# Patient Record
Sex: Male | Born: 1965
Health system: Southern US, Community
[De-identification: ages and names within clinical notes are randomized; demographics above are authoritative.]

## PROBLEM LIST (undated history)

## (undated) DIAGNOSIS — I10 Essential (primary) hypertension: Secondary | ICD-10-CM

## (undated) DIAGNOSIS — K5792 Diverticulitis of intestine, part unspecified, without perforation or abscess without bleeding: Secondary | ICD-10-CM

## (undated) DIAGNOSIS — F419 Anxiety disorder, unspecified: Secondary | ICD-10-CM

## (undated) DIAGNOSIS — I509 Heart failure, unspecified: Secondary | ICD-10-CM

## (undated) DIAGNOSIS — J45902 Unspecified asthma with status asthmaticus: Secondary | ICD-10-CM

## (undated) DIAGNOSIS — F32A Depression, unspecified: Secondary | ICD-10-CM

## (undated) DIAGNOSIS — F329 Major depressive disorder, single episode, unspecified: Secondary | ICD-10-CM

## (undated) DIAGNOSIS — E785 Hyperlipidemia, unspecified: Secondary | ICD-10-CM

## (undated) DIAGNOSIS — E119 Type 2 diabetes mellitus without complications: Secondary | ICD-10-CM

## (undated) DIAGNOSIS — K219 Gastro-esophageal reflux disease without esophagitis: Secondary | ICD-10-CM

## (undated) HISTORY — DX: Gastro-esophageal reflux disease without esophagitis: K21.9

## (undated) HISTORY — PX: COLON SURGERY: SHX602

## (undated) HISTORY — DX: Hyperlipidemia, unspecified: E78.5

## (undated) HISTORY — DX: Major depressive disorder, single episode, unspecified: F32.9

## (undated) HISTORY — DX: Unspecified asthma with status asthmaticus: J45.902

## (undated) HISTORY — DX: Anxiety disorder, unspecified: F41.9

## (undated) HISTORY — DX: Depression, unspecified: F32.A

---

## 2007-04-02 ENCOUNTER — Emergency Department: Payer: Self-pay | Admitting: Unknown Physician Specialty

## 2012-11-02 ENCOUNTER — Emergency Department: Payer: Self-pay | Admitting: Emergency Medicine

## 2012-11-02 LAB — CBC WITH DIFFERENTIAL/PLATELET
Basophil #: 0.1 10*3/uL (ref 0.0–0.1)
Eosinophil %: 4.5 %
HCT: 43.4 % (ref 40.0–52.0)
Lymphocyte #: 2.6 10*3/uL (ref 1.0–3.6)
MCH: 29.2 pg (ref 26.0–34.0)
MCHC: 35.8 g/dL (ref 32.0–36.0)
MCV: 82 fL (ref 80–100)
Neutrophil #: 2.8 10*3/uL (ref 1.4–6.5)
Neutrophil %: 46.5 %
Platelet: 306 10*3/uL (ref 150–440)
RDW: 14.1 % (ref 11.5–14.5)
WBC: 6.1 10*3/uL (ref 3.8–10.6)

## 2012-11-02 LAB — COMPREHENSIVE METABOLIC PANEL
Albumin: 4.2 g/dL (ref 3.4–5.0)
Alkaline Phosphatase: 89 U/L (ref 50–136)
Anion Gap: 7 (ref 7–16)
BUN: 17 mg/dL (ref 7–18)
Bilirubin,Total: 0.4 mg/dL (ref 0.2–1.0)
Calcium, Total: 9.8 mg/dL (ref 8.5–10.1)
Creatinine: 1.23 mg/dL (ref 0.60–1.30)
EGFR (African American): >60
EGFR (Non-African Amer.): >60
Glucose: 393 mg/dL — ABNORMAL HIGH (ref 65–99)
SGPT (ALT): 28 U/L (ref 12–78)
Sodium: 130 mmol/L — ABNORMAL LOW (ref 136–145)
Total Protein: 8.5 g/dL — ABNORMAL HIGH (ref 6.4–8.2)

## 2012-11-02 LAB — URINALYSIS, COMPLETE
Blood: NEGATIVE
Leukocyte Esterase: NEGATIVE
Ph: 5 (ref 4.5–8.0)
Specific Gravity: 1.033 (ref 1.003–1.030)
Squamous Epithelial: NONE SEEN
WBC UR: NONE SEEN /HPF (ref 0–5)

## 2013-12-10 LAB — COMPREHENSIVE METABOLIC PANEL
ALT: 29 U/L (ref 12–78)
ANION GAP: 8 (ref 7–16)
AST: 27 U/L (ref 15–37)
Albumin: 4.4 g/dL (ref 3.4–5.0)
Alkaline Phosphatase: 66 U/L
BILIRUBIN TOTAL: 0.9 mg/dL (ref 0.2–1.0)
BUN: 16 mg/dL (ref 7–18)
CALCIUM: 9.2 mg/dL (ref 8.5–10.1)
CO2: 25 mmol/L (ref 21–32)
Chloride: 107 mmol/L (ref 98–107)
Creatinine: 1.34 mg/dL — ABNORMAL HIGH (ref 0.60–1.30)
EGFR (African American): >60
Glucose: 153 mg/dL — ABNORMAL HIGH (ref 65–99)
OSMOLALITY: 284 (ref 275–301)
Potassium: 3.7 mmol/L (ref 3.5–5.1)
Sodium: 140 mmol/L (ref 136–145)
Total Protein: 8.4 g/dL — ABNORMAL HIGH (ref 6.4–8.2)

## 2013-12-10 LAB — CBC WITH DIFFERENTIAL/PLATELET
Basophil #: 0.1 10*3/uL (ref 0.0–0.1)
Basophil %: 0.6 %
EOS ABS: 0 10*3/uL (ref 0.0–0.7)
Eosinophil %: 0.1 %
HCT: 47.3 % (ref 40.0–52.0)
HGB: 16.2 g/dL (ref 13.0–18.0)
Lymphocyte #: 2.2 10*3/uL (ref 1.0–3.6)
Lymphocyte %: 16.2 %
MCH: 28.4 pg (ref 26.0–34.0)
MCHC: 34.2 g/dL (ref 32.0–36.0)
MCV: 83 fL (ref 80–100)
MONOS PCT: 4.5 %
Monocyte #: 0.6 x10 3/mm (ref 0.2–1.0)
Neutrophil #: 10.7 10*3/uL — ABNORMAL HIGH (ref 1.4–6.5)
Neutrophil %: 78.6 %
PLATELETS: 372 10*3/uL (ref 150–440)
RBC: 5.69 10*6/uL (ref 4.40–5.90)
RDW: 13.4 % (ref 11.5–14.5)
WBC: 13.6 10*3/uL — ABNORMAL HIGH (ref 3.8–10.6)

## 2013-12-10 LAB — LIPASE, BLOOD: LIPASE: 135 U/L (ref 73–393)

## 2013-12-11 ENCOUNTER — Inpatient Hospital Stay: Payer: Self-pay | Admitting: Internal Medicine

## 2013-12-11 LAB — URINALYSIS, COMPLETE
BLOOD: NEGATIVE
Bacteria: NONE SEEN
Bilirubin,UR: NEGATIVE
Glucose,UR: NEGATIVE mg/dL (ref 0–75)
Leukocyte Esterase: NEGATIVE
Nitrite: NEGATIVE
PH: 7 (ref 4.5–8.0)
Protein: 30
SQUAMOUS EPITHELIAL: NONE SEEN
Specific Gravity: 1.054 (ref 1.003–1.030)

## 2013-12-12 LAB — BASIC METABOLIC PANEL
Anion Gap: 5 — ABNORMAL LOW (ref 7–16)
BUN: 11 mg/dL (ref 7–18)
CALCIUM: 8.4 mg/dL — AB (ref 8.5–10.1)
Chloride: 107 mmol/L (ref 98–107)
Co2: 27 mmol/L (ref 21–32)
Creatinine: 1.08 mg/dL (ref 0.60–1.30)
EGFR (African American): >60
GLUCOSE: 96 mg/dL (ref 65–99)
OSMOLALITY: 277 (ref 275–301)
Potassium: 3.7 mmol/L (ref 3.5–5.1)
SODIUM: 139 mmol/L (ref 136–145)

## 2013-12-12 LAB — CBC WITH DIFFERENTIAL/PLATELET
BASOS PCT: 0.5 %
Basophil #: 0 10*3/uL (ref 0.0–0.1)
EOS ABS: 0.3 10*3/uL (ref 0.0–0.7)
Eosinophil %: 3.7 %
HCT: 42.1 % (ref 40.0–52.0)
HGB: 14.5 g/dL (ref 13.0–18.0)
LYMPHS PCT: 38.7 %
Lymphocyte #: 3 10*3/uL (ref 1.0–3.6)
MCH: 29.1 pg (ref 26.0–34.0)
MCHC: 34.5 g/dL (ref 32.0–36.0)
MCV: 85 fL (ref 80–100)
Monocyte #: 0.5 x10 3/mm (ref 0.2–1.0)
Monocyte %: 6.6 %
NEUTROS PCT: 50.5 %
Neutrophil #: 3.9 10*3/uL (ref 1.4–6.5)
Platelet: 277 10*3/uL (ref 150–440)
RBC: 4.98 10*6/uL (ref 4.40–5.90)
RDW: 13.5 % (ref 11.5–14.5)
WBC: 7.8 10*3/uL (ref 3.8–10.6)

## 2014-03-13 ENCOUNTER — Inpatient Hospital Stay: Payer: Self-pay | Admitting: Internal Medicine

## 2014-03-13 DIAGNOSIS — I059 Rheumatic mitral valve disease, unspecified: Secondary | ICD-10-CM

## 2014-03-13 LAB — CBC
HCT: 42.1 % (ref 40.0–52.0)
HGB: 14.3 g/dL (ref 13.0–18.0)
MCH: 28.6 pg (ref 26.0–34.0)
MCHC: 34 g/dL (ref 32.0–36.0)
MCV: 84 fL (ref 80–100)
PLATELETS: 309 10*3/uL (ref 150–440)
RBC: 5 10*6/uL (ref 4.40–5.90)
RDW: 13.6 % (ref 11.5–14.5)
WBC: 6.8 10*3/uL (ref 3.8–10.6)

## 2014-03-13 LAB — COMPREHENSIVE METABOLIC PANEL
ALT: 27 U/L
AST: 18 U/L (ref 15–37)
Albumin: 3.6 g/dL (ref 3.4–5.0)
Alkaline Phosphatase: 55 U/L
Anion Gap: 12 (ref 7–16)
BUN: 10 mg/dL (ref 7–18)
Bilirubin,Total: 0.5 mg/dL (ref 0.2–1.0)
CO2: 22 mmol/L (ref 21–32)
Calcium, Total: 8.6 mg/dL (ref 8.5–10.1)
Chloride: 106 mmol/L (ref 98–107)
Creatinine: 0.98 mg/dL (ref 0.60–1.30)
EGFR (Non-African Amer.): >60
Glucose: 146 mg/dL — ABNORMAL HIGH (ref 65–99)
Osmolality: 281 (ref 275–301)
Potassium: 3.6 mmol/L (ref 3.5–5.1)
SODIUM: 140 mmol/L (ref 136–145)
Total Protein: 7.3 g/dL (ref 6.4–8.2)

## 2014-03-13 LAB — TROPONIN I
Troponin-I: <0.02 ng/mL
Troponin-I: <0.02 ng/mL

## 2014-03-13 LAB — PRO B NATRIURETIC PEPTIDE: B-Type Natriuretic Peptide: 682 pg/mL — ABNORMAL HIGH (ref 0–125)

## 2014-03-14 DIAGNOSIS — J9600 Acute respiratory failure, unspecified whether with hypoxia or hypercapnia: Secondary | ICD-10-CM

## 2014-03-14 DIAGNOSIS — I10 Essential (primary) hypertension: Secondary | ICD-10-CM

## 2014-03-14 DIAGNOSIS — I509 Heart failure, unspecified: Secondary | ICD-10-CM

## 2014-03-14 LAB — CBC WITH DIFFERENTIAL/PLATELET
Basophil #: 0 10*3/uL (ref 0.0–0.1)
Basophil %: 0.1 %
EOS PCT: 0 %
Eosinophil #: 0 10*3/uL (ref 0.0–0.7)
HCT: 39.5 % — ABNORMAL LOW (ref 40.0–52.0)
HGB: 14 g/dL (ref 13.0–18.0)
LYMPHS ABS: 0.9 10*3/uL — AB (ref 1.0–3.6)
Lymphocyte %: 9.4 %
MCH: 29.2 pg (ref 26.0–34.0)
MCHC: 35.5 g/dL (ref 32.0–36.0)
MCV: 82 fL (ref 80–100)
Monocyte #: 0.1 x10 3/mm — ABNORMAL LOW (ref 0.2–1.0)
Monocyte %: 1.6 %
NEUTROS ABS: 8.2 10*3/uL — AB (ref 1.4–6.5)
Neutrophil %: 88.9 %
Platelet: 320 10*3/uL (ref 150–440)
RBC: 4.8 10*6/uL (ref 4.40–5.90)
RDW: 13.5 % (ref 11.5–14.5)
WBC: 9.3 10*3/uL (ref 3.8–10.6)

## 2014-03-14 LAB — BASIC METABOLIC PANEL
Anion Gap: 8 (ref 7–16)
BUN: 17 mg/dL (ref 7–18)
CREATININE: 1.1 mg/dL (ref 0.60–1.30)
Calcium, Total: 8.5 mg/dL (ref 8.5–10.1)
Chloride: 106 mmol/L (ref 98–107)
Co2: 23 mmol/L (ref 21–32)
EGFR (Non-African Amer.): >60
Glucose: 208 mg/dL — ABNORMAL HIGH (ref 65–99)
OSMOLALITY: 281 (ref 275–301)
POTASSIUM: 3.7 mmol/L (ref 3.5–5.1)
Sodium: 137 mmol/L (ref 136–145)

## 2014-03-15 LAB — BASIC METABOLIC PANEL
Anion Gap: 7 (ref 7–16)
BUN: 24 mg/dL — ABNORMAL HIGH (ref 7–18)
CALCIUM: 8.8 mg/dL (ref 8.5–10.1)
Chloride: 99 mmol/L (ref 98–107)
Co2: 26 mmol/L (ref 21–32)
Creatinine: 1.44 mg/dL — ABNORMAL HIGH (ref 0.60–1.30)
EGFR (African American): >60
EGFR (Non-African Amer.): 57 — ABNORMAL LOW
Glucose: 396 mg/dL — ABNORMAL HIGH (ref 65–99)
Osmolality: 285 (ref 275–301)
Potassium: 4 mmol/L (ref 3.5–5.1)
SODIUM: 132 mmol/L — AB (ref 136–145)

## 2014-03-15 LAB — HEMOGLOBIN A1C: Hemoglobin A1C: 7.1 % — ABNORMAL HIGH (ref 4.2–6.3)

## 2014-03-16 LAB — BASIC METABOLIC PANEL
Anion Gap: 5 — ABNORMAL LOW (ref 7–16)
BUN: 21 mg/dL — AB (ref 7–18)
CALCIUM: 8.5 mg/dL (ref 8.5–10.1)
CREATININE: 1 mg/dL (ref 0.60–1.30)
Chloride: 100 mmol/L (ref 98–107)
Co2: 28 mmol/L (ref 21–32)
EGFR (African American): >60
GLUCOSE: 278 mg/dL — AB (ref 65–99)
OSMOLALITY: 279 (ref 275–301)
Potassium: 4.1 mmol/L (ref 3.5–5.1)
SODIUM: 133 mmol/L — AB (ref 136–145)

## 2014-09-25 ENCOUNTER — Emergency Department: Payer: Self-pay | Admitting: Emergency Medicine

## 2014-11-30 NOTE — H&P (Signed)
PATIENT NAME:  Kenneth Rios, Kenneth Rios MR#:  [id number] DATE OF BIRTH:  12/02/1965  DATE OF ADMISSION:  12/11/2013  REFERRING PHYSICIAN: Dr. Lucrezia Europe  PRIMARY CARE PHYSICIAN: None, but reports occasionally at [facility] clinic.   CHIEF COMPLAINT: Abdominal pain.   HISTORY OF PRESENT ILLNESS: This is a 49 year old male with known history of hypertension, diverticulitis in the past, and diabetes mellitus, and GERD, who presents with complaints of abdominal pain, reports pain has been going on for the last 24 hours, denies any fever, any chills, any diarrhea, any constipation, but reports nausea and vomiting. Reports pain in his left lower quadrant. Reports he had an episode of such pain in the past many years ago where he was diagnosed with diverticulitis. The patient was afebrile, did not have any leukocytosis but his CT abdomen did show evidence of mild acute diverticulitis. The patient required multiple pain medication in the ED so hospitalists were requested to admit the patient.   PAST MEDICAL HISTORY:  1. GERD. 2. Hypertension.  3. Diabetes.   PAST SURGICAL HISTORY: None.   ALLERGIES: None.   HOME MEDICATIONS: The patient cannot recall any of his home medications, but reports he is on something for reflux and high blood pressure and he thinks it may be metformin for diabetes.   FAMILY HISTORY: Significant for diabetes mellitus and grandfather died at the age of 61 of MI.   SOCIAL HISTORY: The patient denies any smoking. Reports he drinks alcohol on the weekends. No illicit drug use.   REVIEW OF SYSTEMS: GENERAL: Denies fever, chills, fatigue, weakness.  HEENT: Eyes: Denies blurry vision, double vision, inflammation, glaucoma.  ENT: Denies tinnitus, ear pain, hearing loss, epistaxis.  RESPIRATORY: Denies cough, wheezing, hemoptysis.  CARDIOVASCULAR: Denies chest pain, edema, palpitations, syncope.  GASTROINTESTINAL: Reports nausea, vomiting, abdominal pain. Denies diarrhea,  constipation, hematemesis, melena.  GENITOURINARY: Denies dysuria, hematuria, renal colic.  ENDOCRINE: Denies polyuria, polydipsia, heat, or cold intolerance.  HEMATOLOGY: Denies anemia, easy bruising, bleeding diathesis.  INTEGUMENT: Denies acne, rash or skin lesions.  MUSCULOSKELETAL: Denies any joint effusion or erythema, swelling, gout, cramps.  NEUROLOGIC: Denies CVA, TIA, tremors, vertigo, ataxia.  PSYCHIATRIC: Denies anxiety, insomnia, or depression.   PHYSICAL EXAMINATION:  VITAL SIGNS: Temperature 98.4, pulse 99, respiratory rate 24, blood pressure 176/113, saturating 97% on room air.  GENERAL: Well-nourished male who looks comfortable in bed, in mild distress due to pain. HEENT: Head atraumatic, normocephalic. Pupils equal, reactive to light. Pink conjunctivae. Anicteric sclerae. Moist oral mucosa.  NECK: Supple. No thyromegaly. No JVD.  CHEST: Good air entry bilaterally. No wheezing, rales, rhonchi.  CARDIOVASCULAR: S1, S2 heard. No rubs, murmurs, or gallops.  ABDOMEN: Soft, nondistended. Bowel sounds present. Has mild tenderness in the left lower quadrant. No rebound, no guarding.  EXTREMITIES: No edema. No clubbing. No cyanosis. Radial and pedal pulses +2 bilaterally.   PSYCHIATRIC: Appropriate affect. Awake, alert x3. Intact judgment and insight.  NEUROLOGIC: Cranial nerves grossly intact. Motor 5/5. No focal deficits.  MUSCULOSKELETAL: No joint effusion or erythema. Has left thumb partially amputated.  LYMPHATIC: No cervical lymphadenopathy could be appreciated.   IMAGING STUDIES: Mild acute diverticulitis of the distal descending colon. No adjacent focal fluid collection to suggest an abscess.   PERTINENT LABORATORY DATA: Glucose 153, BUN 16, creatinine 1.34, sodium 140, potassium 3.7, chloride 107. White blood cells 13.6, hemoglobin 16.2, hematocrit 47.3, platelets 372,000.   ASSESSMENT AND PLAN:  1. Acute diverticulitis. The patient has mild leukocytosis and significant  pain requiring IV pain  medicine so he will be admitted, will keep n.p.o., on IV fluid hydration. Will be kept on IV Cipro and Flagyl, p.r.n. nausea and pain medicine.  2. Diabetes mellitus. The patient will be n.p.o. so will hold his oral hypoglycemic agents. Will do fingersticks every 6 hours once his home medication is known, we can resume him back on them prior to discharge. While in the hospital, if uncontrolled, we can start him on insulin sliding scale.  3. Hypertension, uncontrolled as well I think has pain contributing to it. Will keep him on p.r.n. hydralazine until his morning meds are known.  4. History of gastroesophageal reflux disease. Start him on Protonix.  5. Deep vein thrombosis prophylaxis. Subcutaneous heparin.   CODE STATUS: Full code.   TOTAL TIME SPENT ON ADMISSION AND PATIENT CARE: 45 minutes.    ____________________________ Starleen Armsawood S. Donovan Persley, MD dse:lt D: 12/11/2013 02:33:50 ET T: 12/11/2013 06:09:54 ET JOB#: [id number]  cc: Starleen Armsawood S. Daliya Parchment, MD, <Dictator> Jennings Stirling Teena IraniS Ariza Evans MD ELECTRONICALLY SIGNED 12/20/2013 23:42

## 2014-11-30 NOTE — Consult Note (Signed)
General Aspect Primary Cardiologist: New to [place]  49 y/o M with h/o HTN, DM, and diverticulitis who presented to [facility] ED on 03/13/2014 with markedly elevated BPs (162/111 at [facility]), as well as increased SOB, and DOE over the past 2 weeks. _______________________   Present Illness 49 y/o M with the above problem list who presented to [facility] on 03/13/2014 with markedly elevated blood pressures of 162/111 at [facility] today, as well as increased SOB, DOE and a dry cough over the past 2 weeks.   No known prior cardiac history or work up.  Over the past 2 weeks he has noticed increased SOB and DOE, sometimes to the point of having to stop and take a break when walking to the corner store. He reports this distance as being from his room (227) to the cafeteria downstairs. With this exertion he will become quite diaphoretic and lightheaded. Never with chest pain. With resting his symptoms will resolve and his is able to continue on his path. He denies any nausea, vomiting, or syncope. He called his nurse at the [facility] and got an appointment. At that appointment he was found to have a BP of 162/111 and pulse ox of 94% on RA. He received 2 dounebs, 125 mg of solumedrol, and 325 mg of aspirin and was transfered to [facility]. At [facility] his troponin have been negative x 3, pro BNP 682, EKG with NSR, 94, LVH, no st/t changes. Echo is pending.   He notes over the past 6 months he has gone from sleeping with 1 pillow to 2 flat pillows, to back in May he bought 2 large pillows and has been sleeping with them. Also, he has had times where he has been apneic at night. He also notes his weight has considerably increased over the past month. Notes a large increase in his abdominal girth. No LLE. Pants continue to fit the same. He does notice early satiety over the past 1 month. He does not use oxygen at home.   He does not take any daily medications for his HTN or DM since spring. Blood sugars  have been found to be midlly elevated at 146-208.   Physical Exam:  GEN well developed, well nourished, no acute distress, pleasant   HEENT PERRL, hearing intact to voice, wearing [place]   NECK supple   RESP normal resp effort  deminished bilaterally   CARD Regular rate and rhythm  Murmur   Murmur Systolic  3/6 LUSB   ABD positive tenderness  soft  normal BS  diffuse TTP   EXTR negative edema   SKIN normal to palpation   NEURO cranial nerves intact   PSYCH alert, A+O to time, place, person, good insight   Review of Systems:  General: Weight gain   Skin: No Complaints   ENT: No Complaints   Eyes: No Complaints   Neck: No Complaints   Respiratory: Frequent cough  Short of breath   Cardiovascular: Dyspnea   Gastrointestinal: No Complaints   Genitourinary: No Complaints   Vascular: No Complaints   Musculoskeletal: No Complaints   Neurologic: No Complaints   Hematologic: No Complaints   Endocrine: No Complaints   Psychiatric: No Complaints   Review of Systems: All other systems were reviewed and found to be negative   Medications/Allergies Reviewed Medications/Allergies reviewed   Family & Social History:  Family and Social History:  Family History Mother side: CAD/MI, Father side: CAD/MI   Social History negative  tobacco, positive ETOH, remote THC in high school. drinks a 12 pack every Friday night.   Place of Living Home     htn:    Diabetes:    GERD - Esophageal Reflux:    Diverticulitis:   Lab Results:  Routine Chem:  06-Aug-15 04:00   Glucose, Serum  208  BUN 17  Creatinine (comp) 1.10  Sodium, Serum 137  Potassium, Serum 3.7  Chloride, Serum 106  CO2, Serum 23  Calcium (Total), Serum 8.5  Anion Gap 8  Osmolality (calc) 281  eGFR (African American) >60  eGFR (Non-African American) >60 (eGFR values <20m/min/1.73 m2 may be an indication of chronic kidney disease (CKD). Calculated eGFR is useful in patients with stable renal  function. The eGFR calculation will not be reliable in acutely ill patients when serum creatinine is changing rapidly. It is not useful in  patients on dialysis. The eGFR calculation may not be applicable to patients at the low and high extremes of body sizes, pregnant women, and vegetarians.)  Cardiac:  05-Aug-15 11:24   Troponin I < 0.02 (0.00-0.05 0.05 ng/mL or less: NEGATIVE  Repeat testing in 3-6 hrs  if clinically indicated. >0.05 ng/mL: POTENTIAL  MYOCARDIAL INJURY. Repeat  testing in 3-6 hrs if  clinically indicated. NOTE: An increase or decrease  of 30% or more on serial  testing suggests a  clinically important change)    18:23   Troponin I < 0.02 (0.00-0.05 0.05 ng/mL or less: NEGATIVE  Repeat testing in 3-6 hrs  if clinically indicated. >0.05 ng/mL: POTENTIAL  MYOCARDIAL INJURY. Repeat  testing in 3-6 hrs if  clinically indicated. NOTE: An increase or decrease  of 30% or more on serial  testing suggests a  clinically important change)    20:35   Troponin I < 0.02 (0.00-0.05 0.05 ng/mL or less: NEGATIVE  Repeat testing in 3-6 hrs  if clinically indicated. >0.05 ng/mL: POTENTIAL  MYOCARDIAL INJURY. Repeat  testing in 3-6 hrs if  clinically indicated. NOTE: An increase or decrease  of 30% or more on serial  testing suggests a  clinically important change)  Routine Hem:  06-Aug-15 04:00   WBC (CBC) 9.3  RBC (CBC) 4.80  Hemoglobin (CBC) 14.0  Hematocrit (CBC)  39.5  Platelet Count (CBC) 320  MCV 82  MCH 29.2  MCHC 35.5  RDW 13.5  Neutrophil % 88.9  Lymphocyte % 9.4  Monocyte % 1.6  Eosinophil % 0.0  Basophil % 0.1  Neutrophil #  8.2  Lymphocyte #  0.9  Monocyte #  0.1  Eosinophil # 0.0  Basophil # 0.0 (Result(s) reported on 14 Mar 2014 at 04:54AM.)   EKG:  EKG Interp. by me   Interpretation NSR, 94, LVH, no st/t changes   Radiology Results: XRay:    05-Aug-15 12:07, Chest PA and Lateral  Chest PA and Lateral   REASON FOR EXAM:     Shortness of Breath  COMMENTS:   May transport without cardiac monitor    PROCEDURE: DXR - DXR CHEST PA (OR AP) AND LATERAL  - Mar 13 2014 12:07PM     CLINICAL DATA:  Shortness of breath    EXAM:  CHEST  2 VIEW    COMPARISON:  None.    FINDINGS:  Cardiomegaly. Linear areas of scarring in the upper lobes  bilaterally. Diffuse interstitial prominence may reflect  interstitial edema. No confluent opacities or effusions. No acute  bony abnormality.     IMPRESSION:  Cardiomegaly, suspect mild interstitial  edema.    Bilateral upper lobe scarring.      Electronically Signed    By: Rolm Baptise M.D.    On: 03/13/2014 12:35         Verified By: Raelyn Number, M.D.,    No Known Allergies:   Vital Signs/Nurse's Notes: **Vital Signs.:   06-Aug-15 12:28  Vital Signs Type Routine  Temperature Temperature (F) 97.3  Celsius 36.2  Temperature Source oral  Pulse Pulse 88  Respirations Respirations 18  Systolic BP Systolic BP 428  Diastolic BP (mmHg) Diastolic BP (mmHg) 92  Mean BP 112  Pulse Ox % Pulse Ox % 97  Pulse Ox Activity Level  At rest  Oxygen Delivery 2L  *Intake and Output.:   Shift 06-Aug-15 15:00  Grand Totals Intake:   Output:  2325    Net:  -53 24 Hr.:  -2325  Urine ml     Out:  2325  Length of Stay Totals Intake:  300 Output:  3350    Net:  -89    Impression 49 y/o M with h/o HTN, DM, and diverticulitis who presented to [facility] ED on 03/13/2014 with markedly elevated BPs (162/111 at [facility] as well as increased SOB, and DOE over the past 2 weeks.  1) CHF: Type to be determined. Echo is pending. Pro BNP 682. He is diuresising well on lasix 40 mg bid. Currently -2325 for the 24hr. Creatinine stable at 1.10. Weight 225 on admission. 218 currently. He states he does not eat a diet high is fried foods or high in sodium. He will need follow up in the CHF clinic. EKG without ischemic changes. Troponin negative x 3. Pending echo results may pursue  ischemic evaluation as outpatient with nuc. Hold bb in setting of new onset acute CHF. Plan for acei once he is back into better shape.   2) Acute respiratory distress: Currently on 2L [place]. No lower extremity swelling, erythema, warmth, or cording. Not tachycardic. He was tachypneic at outside clinic, this has resolved with treatment. He does not use oxygen at home. CT of chest will be order per IM note. Currently receiving nebs and low dose steroids.    3) HTN: Currently 150's/90's. Will continue to monitor and get under better control.   4) DM: A1C pending. On SSI while inpatient.   Electronic Signatures for Addendum Section:  Kathlyn Sacramento (MD) (Signed Addendum 06-Aug-15 17:15)  The patient was seen and examined. Agree with the above. He has prolonged history of untreated hypertension. He presented with new onset heart failure. Echo showed an EF of 30-35% with global hypokinesis likely due to hypertensive heart disease.  Recommend: Continue diuresis.  I added Coreg and Lisinopril.  Outpatient nuclear stress test.  Advised him to stop ETOH .   Electronic Signatures: Kathlyn Sacramento (MD)  (Signed 06-Aug-15 17:15)  Co-Signer: General Aspect/Present Illness, Home Medications, Allergies Rise Mu (PA-C)  (Signed 06-Aug-15 16:18)  Authored: General Aspect/Present Illness, History and Physical Exam, Review of System, Family & Social History, Past Medical History, Home Medications, Labs, EKG , Radiology, Allergies, Vital Signs/Nurse's Notes, Impression/Plan   Last Updated: 06-Aug-15 17:15 by Kathlyn Sacramento (MD)

## 2014-11-30 NOTE — Discharge Summary (Signed)
PATIENT NAME:  Kenneth Rios, Kenneth Rios MR#:  [id number] DATE OF BIRTH:  1965/10/23  DATE OF ADMISSION:  03/13/2014 DATE OF DISCHARGE:  03/16/2014  PRIMARY CARE PHYSICIAN:  At [facility].   FINAL DIAGNOSES: 1.  Acute systolic congestive heart failure.  2.  Accelerated hypertension.  3.  Diabetes.  4.  Bronchitis.   MEDICATIONS:  On discharge include:  1.  Lisinopril 10 mg daily.  2.  Aspirin 81 mg daily.  3.  Coreg 12.5 mg twice a day.  4.  Zithromax 250 mg 1 tablet once a day for 2 days.  5.  Furosemide 40 mg daily.  6.  Glucophage 500 mg twice a day.   DIET: Low-sodium, carbohydrate controlled diet; diet is regular consistency.    ACTIVITY: As tolerated.   FOLLOWUP: At [facility] in 1 week; Congestive Heart Failure Clinic, Diabetes Clinic in 1 to 2 weeks, [facility].   HOSPITAL COURSE: The patient was admitted 03/13/2014, and discharged 03/16/2014; came in with trouble breathing, was admitted with acute congestive heart failure started on IV Lasix, started on antibiotics, and steroids for wheezing.   LABORATORY AND RADIOLOGICAL DATA:  1.  EKG showed normal sinus rhythm, left atrial enlargement, nonspecific ST-T wave changes. 2.  BNP 682; troponin negative; glucose 146, BUN 10, creatinine 0.98, sodium 140, potassium 3.6, chloride 106, CO2 of 22, calcium 8.6; liver function tests normal range; white blood cell count 6.8, hemoglobin, and hematocrit 14.3, and 42.1; platelet count of 309,000; chest x-ray showed cardiomegaly, suspect mild interstitial edema, bilateral upper lobe scarring  3.  Two troponins negative.  4.  Echocardiogram showed an ejection fraction of 30% to 35%, impaired relaxation of left ventricular diastolic filling, moderately dilated left atrium, moderate mitral valve regurgitation. 5.  Hemoglobin A1c 7.1, creatinine upon discharge 1.0, potassium 4.1, sodium 133.   HOSPITAL COURSE:  Per problem list:  1.  For the acute systolic congestive  heart failure, the patient was diuresed with IV Lasix, the patient's lungs were clear upon discharge, cut back to 40 mg once a day; lisinopril, and Coreg were added, and Coreg was titrated up to 12.5 mg b.i.d.  Can consider adding Aldactone as an outpatient.  2.  Accelerated hypertension. The patient's blood pressure variable during the hospital course; numbers as low as 124/77, high as 143/97. Continue to monitor with newly started medications.  3.  Diabetes. Can go back on his metformin.  4.  Bronchitis. We will give a completion of the course of the Zithromax, steroids were stopped, no wheezing upon discharge.   TIME SPENT ON DISCHARGE: Was 35 minutes.    ____________________________ Herschell Dimesichard J. Renae GlossWieting, MD rjw:nt D: 03/16/2014 15:21:17 ET T: 03/16/2014 16:25:31 ET JOB#: [id number]  cc: Herschell Dimesichard J. Renae GlossWieting, MD, <Dictator> [facility] Christopher Burge, MD   Salley ScarletICHARD J Sami Froh MD ELECTRONICALLY SIGNED 03/18/2014 11:49

## 2014-11-30 NOTE — H&P (Signed)
PATIENT NAME:  Kenneth Rios, Kenneth Rios MR#:  [id number] DATE OF BIRTH:  Jan 31, 1966  DATE OF ADMISSION:  03/13/2014  PRIMARY CARE PHYSICIAN:  [facility].  CHIEF COMPLAINT: Trouble breathing.   HISTORY OF PRESENT ILLNESS: The patient is a 49 year old obese male who came in from [facility] because of elevated blood pressure and the patient was [facility]. The patient has been having trouble breathing for about 2 weeks, gotten worse, so he [facility] and the patient was found blood to have elevated blood pressure at 162/111 and he also has tachypneic, so they sent him over here.  The patient's oxygen saturation is 94% on room air when he came and blood pressure was 162/111 and he received 325 mg of aspirin and 2 DuoNebs and 125 mg Solu-Medrol.  We are going to admit the patient for new onset CHF.  The patient has a history of hypertension and diabetes but not taking any medications since April.  The patient has some complaints of short windedness for 2 weeks associated with dry cough and the patient says that breathing makes the shortness of breath worse and he feels more cough, especially dry cough when he tries to take a deep breath. The patient denies any chest pain. No orthopnea. No PND. No pedal edema. The patient denies any dizziness. Denies any fever and denies any chills and sweating.   PAST MEDICAL HISTORY: Significant for recent admission for diverticulitis and history of diabetes and hypertension. The patient was admitted in May, from May 5 to May 7.  At that time, he was discharged on Cipro and Flagyl.   ALLERGIES: He has no allergies.   SOCIAL HISTORY: No smoking. No drinking. No drugs. The patient works at [employer] [place].  He works for [employer], mainly involved in [employer]making [employer] and [employer]. He denies any seasonal allergies and allergies at work.  He is a former  smoker.  MEDICATIONS: Not taking any medications since April.   PAST SURGICAL HISTORY: None.   FAMILY HISTORY: No hypertension or diabetes. Has a history of heart attack in the maternal grandfather and diabetes in  maternal grandmother.   REVIEW OF SYSTEMS:  CONSTITUTIONAL: No fever. No fatigue.  EYES: No blurred vision.  ENT: No tinnitus. No ear pain. No epistaxis.  RESPIRATORY: The patient has dry cough and trouble breathing and trouble breathing is going on for 2 weeks. The patient yesterday coughs when he tries to take deep breaths.  CARDIOVASCULAR: No chest pain. No orthopnea. No PND.  GASTROINTESTINAL: No nausea. No vomiting, abdominal pain. GENITOURINARY:  No dysuria.  ENDOCRINE: No polyuria or nocturia. HEMATOLOGIC:  No anemia.  INTEGUMENT: No skin rashes.  MUSCULOSKELETAL: No joint pains.  NEUROLOGIC: No numbness or weakness.  PSYCHIATRIC: No anxiety or insomnia.   PHYSICAL EXAMINATION: VITAL SIGNS: Temperature 97.9, heart rate is 96, blood pressure is 162/111, saturations 98% on room air.  The patient's repeat blood pressure during my visit is around 130/70, saturations are 98% on room air.  GENERAL: He is alert, awake, oriented, 49 year old male, appears in slight distress because of  continuous dry cough. The patient appears slightly anxious.  HEENT:  PERRLA.  EOM intact.  No conjunctivitis. No icterus. No oropharyngeal erythema. Mucous membranes are dry.  NECK: Supple. No JVD. No carotid bruit. Normal range of motion. The patient has mild expiratory wheeze in right lung fields. No JVD.  RESPIRATORY: As  I mentioned.  The patient has no increased effort of respiration.  CARDIOVASCULAR: Slightly tachycardic. Regular rate.  PMI not displaced. Good pedal pulses, trace pitting edema.  ABDOMEN: Soft, nontender, nondistended. Bowel sounds present. No hernias.  MUSCULOSKELETAL: 5/5 muscular strength in 4 extremities.  EXTREMITIES: The patient has no kyphosis.  SKIN: No skin  rashes.   LYMPHATICS:  No  lymphadenopathy in cervical or axillary region.   NEUROLOGIC: Cranial nerves II through XII intact. Power 5/5 in upper and lower extremities. Sensory intact.  DTRs 2+ bilaterally.  PSYCHIATRIC: , alert and oriented x3. Mood and effect are WNL  LABORATORY DATA: WBC 7.8, hemoglobin 14.5, hematocrit 42.1, platelets a 277,000.  Electrolytes: Sodium is 139, potassium 3.7, chloride 107, bicarbonate 27, BUN 11, creatinine 1.8, glucose 96. The patient's bacteria none in the urine. Troponin less than 0.02. BNP 682.   RADIOLOGY DATA:  Chest x-ray shows cardiomegaly with some mild interstitial edema,  bilateral upper lobe scarring.   EKG shows normal sinus rhythm at 94 beats per minute. No ST-T changes  ASSESSMENT AND PLAN: The patient is a 49 year old male patient with hypertension, diabetes, medication noncompliance, comes in with trouble breathing and dry cough with elevated BNP.  1.  Admit him to telemetry for new onset congestive heart failure. Start him on IV Lasix 40 mg q. 12 hours and check daily weights. Follow echocardiogram, and the patient's will have CHF Clinic followup and continue low-sodium diet.  2.  With trouble breathing, evaluate for possible myocardial infarction. Can cycle the troponins. Continue aspirin and avoid beta blockers because of dry cough.  3.  Possible bronchitis and chronic obstructive pulmonary disease flare with some wheezing. Continue him on nebulizers and add small dose steroids.  The patient can get CT of the chest after the echocardiogram is normal to evaluate for lung function.  4.  Diabetes mellitus, type 2. The patient will have hemoglobin A1c, fasting lipase and start him on sliding scale with coverage.   TIME SPENT ON HISTORY AND PHYSICAL: About 60 minutes.    ____________________________ Katha Hamming, MD sk:ds D: 03/13/2014 16:23:10 ET T: 03/13/2014 17:20:25 ET JOB#: [id number]  cc: Katha Hamming, MD,  <Dictator> Katha Hamming MD ELECTRONICALLY SIGNED 04/16/2014 13:09

## 2014-11-30 NOTE — Discharge Summary (Signed)
Dates of Admission and Diagnosis:  Date of Admission 11-Dec-2013   Date of Discharge 13-Dec-2013   Admitting Diagnosis Ac diverticulitis   Final Diagnosis Ac diverticulitis Htn Dm    Chief Complaint/History of Present Illness a 49 year old male with known history of hypertension, diverticulitis in the past, and diabetes mellitus, and GERD, who presents with complaints of abdominal pain, reports pain has been going on for the last 24 hours, denies any fever, any chills, any diarrhea, any constipation, but reports nausea and vomiting. Reports pain in his left lower quadrant. Reports he had an episode of such pain in the past many years ago where he was diagnosed with diverticulitis. The patient was afebrile, did not have any leukocytosis but his CT abdomen did show evidence of mild acute diverticulitis. The patient required multiple pain medication in the ED so hospitalists were requested to admit the patient.   Allergies:  No Known Allergies:   Hepatic:  04-May-15 21:56   Bilirubin, Total 0.9  Alkaline Phosphatase 66 (45-117 NOTE: New Reference Range 06/29/13)  SGPT (ALT) 29  SGOT (AST) 27  Total Protein, Serum  8.4  Albumin, Serum 4.4  Routine Chem:  04-May-15 21:56   Glucose, Serum  153  BUN 16  Creatinine (comp)  1.34  Sodium, Serum 140  Potassium, Serum 3.7  Chloride, Serum 107  CO2, Serum 25  Calcium (Total), Serum 9.2  Anion Gap 8  Osmolality (calc) 284  eGFR (African American) >60  eGFR (Non-African American) >60 (eGFR values <23m/min/1.73 m2 may be an indication of chronic kidney disease (CKD). Calculated eGFR is useful in patients with stable renal function. The eGFR calculation will not be reliable in acutely ill patients when serum creatinine is changing rapidly. It is not useful in  patients on dialysis. The eGFR calculation may not be applicable to patients at the low and high extremes of body sizes, pregnant women, and vegetarians.)  Lipase 135  (Result(s) reported on 10 Dec 2013 at 10:49PM.)  06-May-15 03:47   Creatinine (comp) 1.08  Routine Hem:  04-May-15 21:56   WBC (CBC)  13.6  RBC (CBC) 5.69  Hemoglobin (CBC) 16.2  Hematocrit (CBC) 47.3  Platelet Count (CBC) 372  MCV 83  MCH 28.4  MCHC 34.2  RDW 13.4  Neutrophil % 78.6  Lymphocyte % 16.2  Monocyte % 4.5  Eosinophil % 0.1  Basophil % 0.6  Neutrophil #  10.7  Lymphocyte # 2.2  Monocyte # 0.6  Eosinophil # 0.0  Basophil # 0.1 (Result(s) reported on 10 Dec 2013 at 10:17PM.)   PERTINENT RADIOLOGY STUDIES: CT:    05-May-15 00:23, CT Abdomen and Pelvis With Contrast  CT Abdomen and Pelvis With Contrast   REASON FOR EXAM:    (1) ABD PAIN; (2) ABD PAIN  COMMENTS:       PROCEDURE: CT  - CT ABDOMEN / PELVIS  W  - Dec 11 2013 12:23AM     CLINICAL DATA:  Left lower quadrant pain, elevated WBCs, history of  diverticulosis    EXAM:  CT ABDOMEN AND PELVIS WITH CONTRAST    TECHNIQUE:  Multidetector CT imaging of the abdomen and pelvis was performed  using the standard protocol following bolus administration of  intravenous contrast.  CONTRAST:  100 mL Isovue-300    COMPARISON:  None.    FINDINGS:  The lungbases are clear.    The liver demonstrates no focal abnormality. There is no  intrahepatic or extrahepatic biliary ductal dilatation. The  gallbladder is normal.  The spleen demonstrates no focal abnormality.  The kidneys, adrenal glands and pancreas are normal. The bladder is  unremarkable.    The stomach, duodenum, small intestine, and large intestine  demonstrate no contrast extravasation or dilatation. There is  diverticulosis of the descending and sigmoid colon with mild    Sigmoidal inflammatory changes involving the distal descending colon  most consistent with acute diverticulitis. There is no  peridiverticular fluid collection. There is no pneumoperitoneum,  pneumatosis, or portal venous gas. There is no abdominal or pelvic  free fluid. There is  no lymphadenopathy.    The abdominal aorta is normal in caliber.    There are no lytic or sclerotic osseous lesions.     IMPRESSION:  1. Mild acute diverticulitis of the distal descending colon. No  adjacent focal fluid collection to suggest an abscess.  Electronically Signed    By: Kathreen Devoid    On: 12/11/2013 00:36         Verified By: Jennette Banker, M.D., MD   Pertinent Past History:  Pertinent Past History 1. GERD. 2. Hypertension.  3. Diabetes.   Hospital Course:  Hospital Course * Acute diverticulitis. on IV fluid hydration. on IV Cipro and Flagyl, p.r.n. nausea and pain medicine. tolerated liquid diet.     swiched pain meds to oral for long term control. IV for breakthrough. much better now- will give soft diet- if tolerates- discharge  later today. * Diabetes mellitus. n.p.o. , hold his oral hypoglycemic agents. blood glucose stable. as less intake. * Hypertension, uncontrolled , pain contributing to it.  on p.r.n. hydralazine. now little better controlled. started on amlodipin oral.   Condition on Discharge Stable   Code Status:  Code Status Full Code   DISCHARGE INSTRUCTIONS HOME MEDS:  Medication Reconciliation: Patient's Home Medications at Discharge:     Medication Instructions  flagyl 500 mg oral tablet  1 tab(s) orally every 8 hours x 10 days   oxycodone 15 mg oral tablet, extended release  1 tab(s) orally every 12 hours x 5 days   acetaminophen-oxycodone 325 mg-5 mg oral tablet  1 tab(s) orally 1 to 4 times a day x 4 days, As Needed, pain , As needed, pain   amlodipine 5 mg oral tablet  1 tab(s) orally once a day   pantoprazole 40 mg oral delayed release tablet  1 tab(s) orally once a day   cipro 500 mg oral tablet  1 tab(s) orally every 12 hours x 10 days     Physician's Instructions:  Diet Low Sodium  Carbohydrate Controlled (ADA) Diet   Activity Limitations As tolerated   Return to Work Not Applicable   Time frame for Follow Up  Appointment 2-4 weeks   Other Comments routine follow ups with PMD.   Electronic Signatures: Vaughan Basta (MD)  (Signed 10-May-15 23:56)  Authored: ADMISSION DATE AND DIAGNOSIS, CHIEF COMPLAINT/HPI, Allergies, PERTINENT LABS, PERTINENT RADIOLOGY STUDIES, PERTINENT PAST HISTORY, HOSPITAL COURSE, DISCHARGE INSTRUCTIONS HOME MEDS, PATIENT INSTRUCTIONS   Last Updated: 10-May-15 23:56 by Vaughan Basta (MD)

## 2017-07-15 ENCOUNTER — Ambulatory Visit: Payer: Self-pay | Admitting: Internal Medicine

## 2017-08-24 ENCOUNTER — Other Ambulatory Visit: Payer: Self-pay

## 2017-08-24 ENCOUNTER — Emergency Department (HOSPITAL_COMMUNITY): Payer: Medicaid Other

## 2017-08-24 ENCOUNTER — Inpatient Hospital Stay (HOSPITAL_COMMUNITY)
Admission: EM | Admit: 2017-08-24 | Discharge: 2017-08-30 | DRG: 190 | Disposition: A | Discharge status: Home / Self Care | Payer: Medicaid Other | Attending: Family Medicine | Admitting: Family Medicine

## 2017-08-24 ENCOUNTER — Encounter (HOSPITAL_COMMUNITY): Payer: Self-pay

## 2017-08-24 DIAGNOSIS — I110 Hypertensive heart disease with heart failure: Secondary | ICD-10-CM

## 2017-08-24 DIAGNOSIS — E1165 Type 2 diabetes mellitus with hyperglycemia: Secondary | ICD-10-CM | POA: Diagnosis present

## 2017-08-24 DIAGNOSIS — I161 Hypertensive emergency: Secondary | ICD-10-CM | POA: Diagnosis present

## 2017-08-24 DIAGNOSIS — I160 Hypertensive urgency: Secondary | ICD-10-CM | POA: Diagnosis present

## 2017-08-24 DIAGNOSIS — J209 Acute bronchitis, unspecified: Secondary | ICD-10-CM | POA: Diagnosis present

## 2017-08-24 DIAGNOSIS — I10 Essential (primary) hypertension: Secondary | ICD-10-CM

## 2017-08-24 DIAGNOSIS — J441 Chronic obstructive pulmonary disease with (acute) exacerbation: Principal | ICD-10-CM | POA: Diagnosis present

## 2017-08-24 DIAGNOSIS — J440 Chronic obstructive pulmonary disease with acute lower respiratory infection: Secondary | ICD-10-CM | POA: Diagnosis present

## 2017-08-24 DIAGNOSIS — Z7984 Long term (current) use of oral hypoglycemic drugs: Secondary | ICD-10-CM | POA: Diagnosis not present

## 2017-08-24 DIAGNOSIS — B9789 Other viral agents as the cause of diseases classified elsewhere: Secondary | ICD-10-CM | POA: Diagnosis present

## 2017-08-24 DIAGNOSIS — K5720 Diverticulitis of large intestine with perforation and abscess without bleeding: Secondary | ICD-10-CM | POA: Diagnosis present

## 2017-08-24 DIAGNOSIS — Z79899 Other long term (current) drug therapy: Secondary | ICD-10-CM

## 2017-08-24 DIAGNOSIS — J9801 Acute bronchospasm: Secondary | ICD-10-CM

## 2017-08-24 DIAGNOSIS — Z23 Encounter for immunization: Secondary | ICD-10-CM | POA: Diagnosis not present

## 2017-08-24 DIAGNOSIS — T380X5A Adverse effect of glucocorticoids and synthetic analogues, initial encounter: Secondary | ICD-10-CM | POA: Diagnosis present

## 2017-08-24 DIAGNOSIS — K5792 Diverticulitis of intestine, part unspecified, without perforation or abscess without bleeding: Secondary | ICD-10-CM | POA: Diagnosis not present

## 2017-08-24 DIAGNOSIS — E1159 Type 2 diabetes mellitus with other circulatory complications: Secondary | ICD-10-CM

## 2017-08-24 DIAGNOSIS — R51 Headache: Secondary | ICD-10-CM | POA: Diagnosis present

## 2017-08-24 DIAGNOSIS — Z9119 Patient's noncompliance with other medical treatment and regimen: Secondary | ICD-10-CM | POA: Diagnosis not present

## 2017-08-24 DIAGNOSIS — I509 Heart failure, unspecified: Secondary | ICD-10-CM

## 2017-08-24 DIAGNOSIS — Z87891 Personal history of nicotine dependence: Secondary | ICD-10-CM | POA: Diagnosis not present

## 2017-08-24 DIAGNOSIS — R791 Abnormal coagulation profile: Secondary | ICD-10-CM | POA: Diagnosis present

## 2017-08-24 DIAGNOSIS — I5043 Acute on chronic combined systolic (congestive) and diastolic (congestive) heart failure: Secondary | ICD-10-CM | POA: Diagnosis present

## 2017-08-24 DIAGNOSIS — R109 Unspecified abdominal pain: Secondary | ICD-10-CM

## 2017-08-24 DIAGNOSIS — J208 Acute bronchitis due to other specified organisms: Secondary | ICD-10-CM

## 2017-08-24 DIAGNOSIS — J45902 Unspecified asthma with status asthmaticus: Secondary | ICD-10-CM | POA: Diagnosis not present

## 2017-08-24 DIAGNOSIS — E119 Type 2 diabetes mellitus without complications: Secondary | ICD-10-CM

## 2017-08-24 DIAGNOSIS — Z91128 Patient's intentional underdosing of medication regimen for other reason: Secondary | ICD-10-CM

## 2017-08-24 DIAGNOSIS — I152 Hypertension secondary to endocrine disorders: Secondary | ICD-10-CM

## 2017-08-24 DIAGNOSIS — I5042 Chronic combined systolic (congestive) and diastolic (congestive) heart failure: Secondary | ICD-10-CM

## 2017-08-24 HISTORY — DX: Type 2 diabetes mellitus with other circulatory complications: E11.59

## 2017-08-24 HISTORY — DX: Type 2 diabetes mellitus without complications: E11.9

## 2017-08-24 HISTORY — DX: Essential (primary) hypertension: I10

## 2017-08-24 HISTORY — DX: Acute bronchitis due to other specified organisms: J20.8

## 2017-08-24 HISTORY — DX: Diverticulitis of intestine, part unspecified, without perforation or abscess without bleeding: K57.92

## 2017-08-24 HISTORY — DX: Hypertensive heart disease with heart failure: I11.0

## 2017-08-24 HISTORY — DX: Hypertension secondary to endocrine disorders: I15.2

## 2017-08-24 HISTORY — DX: Hypertensive urgency: I16.0

## 2017-08-24 HISTORY — DX: Heart failure, unspecified: I50.9

## 2017-08-24 LAB — BASIC METABOLIC PANEL
Anion gap: 8 (ref 5–15)
BUN: 8 mg/dL (ref 6–20)
CALCIUM: 9.1 mg/dL (ref 8.9–10.3)
CHLORIDE: 108 mmol/L (ref 101–111)
CO2: 25 mmol/L (ref 22–32)
CREATININE: 1.05 mg/dL (ref 0.61–1.24)
GFR calc non Af Amer: >60 mL/min (ref >60)
GLUCOSE: 160 mg/dL — AB (ref 65–99)
Potassium: 4.1 mmol/L (ref 3.5–5.1)
Sodium: 141 mmol/L (ref 135–145)

## 2017-08-24 LAB — CBC
HCT: 36.5 % — ABNORMAL LOW (ref 39.0–52.0)
Hemoglobin: 12.5 g/dL — ABNORMAL LOW (ref 13.0–17.0)
MCH: 28.1 pg (ref 26.0–34.0)
MCHC: 34.2 g/dL (ref 30.0–36.0)
MCV: 82 fL (ref 78.0–100.0)
PLATELETS: 323 10*3/uL (ref 150–400)
RBC: 4.45 MIL/uL (ref 4.22–5.81)
RDW: 12.8 % (ref 11.5–15.5)
WBC: 6.6 10*3/uL (ref 4.0–10.5)

## 2017-08-24 LAB — I-STAT TROPONIN, ED: TROPONIN I, POC: 0 ng/mL (ref 0.00–0.08)

## 2017-08-24 LAB — CBG MONITORING, ED: GLUCOSE-CAPILLARY: 281 mg/dL — AB (ref 65–99)

## 2017-08-24 LAB — BRAIN NATRIURETIC PEPTIDE: B Natriuretic Peptide: 340.3 pg/mL — ABNORMAL HIGH (ref 0.0–100.0)

## 2017-08-24 MED ORDER — ALBUTEROL SULFATE (2.5 MG/3ML) 0.083% IN NEBU
5.0000 mg | INHALATION_SOLUTION | Freq: Once | RESPIRATORY_TRACT | Status: AC
  Administered 2017-08-24: 5 mg via RESPIRATORY_TRACT
  Filled 2017-08-24: qty 6

## 2017-08-24 MED ORDER — FUROSEMIDE 10 MG/ML IJ SOLN: 20.0000 mg | Freq: Once | INTRAMUSCULAR | Status: DC

## 2017-08-24 MED ORDER — ALBUTEROL SULFATE (2.5 MG/3ML) 0.083% IN NEBU
2.5000 mg | INHALATION_SOLUTION | RESPIRATORY_TRACT | Status: DC | PRN
  Administered 2017-08-25: 2.5 mg via RESPIRATORY_TRACT
  Filled 2017-08-24: qty 3

## 2017-08-24 MED ORDER — HYDRALAZINE HCL 20 MG/ML IJ SOLN
10.0000 mg | INTRAMUSCULAR | Status: DC | PRN
  Administered 2017-08-25 – 2017-08-26 (×4): 10 mg via INTRAVENOUS
  Filled 2017-08-24 (×4): qty 1

## 2017-08-24 MED ORDER — METHYLPREDNISOLONE SODIUM SUCC 125 MG IJ SOLR
125.0000 mg | Freq: Once | INTRAMUSCULAR | Status: AC
  Administered 2017-08-24: 125 mg via INTRAVENOUS
  Filled 2017-08-24: qty 2

## 2017-08-24 MED ORDER — CARVEDILOL PHOSPHATE ER 10 MG PO CP24
10.0000 mg | ORAL_CAPSULE | Freq: Every day | ORAL | Status: DC
  Administered 2017-08-25 – 2017-08-30 (×6): 10 mg via ORAL
  Filled 2017-08-24 (×8): qty 1

## 2017-08-24 MED ORDER — SODIUM CHLORIDE 0.9% FLUSH: 3.0000 mL | INTRAVENOUS | Status: DC | PRN

## 2017-08-24 MED ORDER — ONDANSETRON HCL 4 MG/2ML IJ SOLN
4.0000 mg | Freq: Four times a day (QID) | INTRAMUSCULAR | Status: DC | PRN
Start: 2017-08-24 — End: 2017-08-30
  Administered 2017-08-28: 4 mg via INTRAVENOUS
  Filled 2017-08-24: qty 2

## 2017-08-24 MED ORDER — ALPRAZOLAM 0.25 MG PO TABS
0.2500 mg | ORAL_TABLET | Freq: Two times a day (BID) | ORAL | Status: DC | PRN
  Administered 2017-08-24: 0.25 mg via ORAL
  Filled 2017-08-24: qty 1

## 2017-08-24 MED ORDER — ATORVASTATIN CALCIUM 10 MG PO TABS
10.0000 mg | ORAL_TABLET | Freq: Every day | ORAL | Status: DC
  Administered 2017-08-25 – 2017-08-30 (×6): 10 mg via ORAL
  Filled 2017-08-24 (×7): qty 1

## 2017-08-24 MED ORDER — ALBUTEROL (5 MG/ML) CONTINUOUS INHALATION SOLN
10.0000 mg/h | INHALATION_SOLUTION | RESPIRATORY_TRACT | Status: DC
  Administered 2017-08-24: 10 mg/h via RESPIRATORY_TRACT
  Filled 2017-08-24: qty 20

## 2017-08-24 MED ORDER — FUROSEMIDE 10 MG/ML IJ SOLN
20.0000 mg | Freq: Once | INTRAMUSCULAR | Status: AC
  Administered 2017-08-24: 20 mg via INTRAVENOUS

## 2017-08-24 MED ORDER — IPRATROPIUM-ALBUTEROL 0.5-2.5 (3) MG/3ML IN SOLN
3.0000 mL | Freq: Once | RESPIRATORY_TRACT | Status: AC
  Administered 2017-08-24: 3 mL via RESPIRATORY_TRACT
  Filled 2017-08-24: qty 3

## 2017-08-24 MED ORDER — MAGNESIUM SULFATE 2 GM/50ML IV SOLN
2.0000 g | Freq: Once | INTRAVENOUS | Status: AC
  Administered 2017-08-24: 2 g via INTRAVENOUS
  Filled 2017-08-24: qty 50

## 2017-08-24 MED ORDER — INSULIN ASPART 100 UNIT/ML ~~LOC~~ SOLN
0.0000 [IU] | SUBCUTANEOUS | Status: DC
  Administered 2017-08-24: 2 [IU] via SUBCUTANEOUS
  Administered 2017-08-25 (×2): 5 [IU] via SUBCUTANEOUS
  Administered 2017-08-25: 3 [IU] via SUBCUTANEOUS
  Administered 2017-08-25: 2 [IU] via SUBCUTANEOUS
  Administered 2017-08-25: 5 [IU] via SUBCUTANEOUS
  Administered 2017-08-26: 7 [IU] via SUBCUTANEOUS
  Administered 2017-08-26: 20 [IU] via SUBCUTANEOUS
  Administered 2017-08-26: 3 [IU] via SUBCUTANEOUS
  Administered 2017-08-26: 7 [IU] via SUBCUTANEOUS
  Administered 2017-08-26: 5 [IU] via SUBCUTANEOUS
  Administered 2017-08-27 (×3): 7 [IU] via SUBCUTANEOUS
  Administered 2017-08-27: 2 [IU] via SUBCUTANEOUS
  Administered 2017-08-27: 3 [IU] via SUBCUTANEOUS
  Administered 2017-08-27: 9 [IU] via SUBCUTANEOUS
  Administered 2017-08-28: 3 [IU] via SUBCUTANEOUS
  Administered 2017-08-28 – 2017-08-29 (×6): 2 [IU] via SUBCUTANEOUS
  Administered 2017-08-29: 3 [IU] via SUBCUTANEOUS
  Administered 2017-08-30: 1 [IU] via SUBCUTANEOUS
  Filled 2017-08-24 (×4): qty 1

## 2017-08-24 MED ORDER — ENOXAPARIN SODIUM 40 MG/0.4ML ~~LOC~~ SOLN
40.0000 mg | SUBCUTANEOUS | Status: DC
  Administered 2017-08-24 – 2017-08-29 (×6): 40 mg via SUBCUTANEOUS
  Filled 2017-08-24 (×6): qty 0.4

## 2017-08-24 MED ORDER — FUROSEMIDE 10 MG/ML IJ SOLN
40.0000 mg | Freq: Two times a day (BID) | INTRAMUSCULAR | Status: DC
  Administered 2017-08-25 – 2017-08-28 (×7): 40 mg via INTRAVENOUS
  Filled 2017-08-24 (×8): qty 4

## 2017-08-24 MED ORDER — LISINOPRIL 10 MG PO TABS
10.0000 mg | ORAL_TABLET | Freq: Every day | ORAL | Status: DC
  Administered 2017-08-25 – 2017-08-28 (×4): 10 mg via ORAL
  Filled 2017-08-24 (×4): qty 1

## 2017-08-24 MED ORDER — METHYLPREDNISOLONE SODIUM SUCC 40 MG IJ SOLR
40.0000 mg | Freq: Three times a day (TID) | INTRAMUSCULAR | Status: DC
  Administered 2017-08-25 – 2017-08-27 (×7): 40 mg via INTRAVENOUS
  Filled 2017-08-24 (×7): qty 1

## 2017-08-24 MED ORDER — SODIUM CHLORIDE 0.9 % IV SOLN
250.0000 mL | INTRAVENOUS | Status: DC | PRN
Start: 2017-08-24 — End: 2017-08-30

## 2017-08-24 MED ORDER — SODIUM CHLORIDE 0.9% FLUSH
3.0000 mL | Freq: Two times a day (BID) | INTRAVENOUS | Status: DC
  Administered 2017-08-25 – 2017-08-29 (×9): 3 mL via INTRAVENOUS

## 2017-08-24 MED ORDER — ACETAMINOPHEN 325 MG PO TABS
650.0000 mg | ORAL_TABLET | ORAL | Status: DC | PRN
  Administered 2017-08-27 – 2017-08-30 (×3): 650 mg via ORAL
  Filled 2017-08-24 (×3): qty 2

## 2017-08-24 MED ORDER — ZOLPIDEM TARTRATE 5 MG PO TABS: 5.0000 mg | ORAL_TABLET | Freq: Every evening | ORAL | Status: DC | PRN

## 2017-08-24 NOTE — ED Provider Notes (Signed)
[facility]   CSN: [id number] Arrival date & time: 08/24/17  1305     History   Chief Complaint Chief Complaint  Patient presents with  . Shortness of Breath    HPI Kenneth Rios is a 52 y.o. male  With a past medical history of CHF, diabetes, hypertension.  Patient just moved to the area has been off all of his medications for several weeks.  2 weeks ago the patient developed symptoms of a URI including a severe cough, nasal congestion, body aches.  Symptoms improved however his cough has become progressively worse with diffuse wheezing, tachypnea, shortness of breath.  His wife tried to give him a breathing treatment at home however he refused.  He has no previous history of asthma.  He does endorse some symptoms of pulmonary edema which include orthopnea, PND however the patient feels short of breath both sitting and exerting himself.  He denies any swelling in his extremities that he has noticed.  He is only had CHF one time before. HPI  Past Medical History:  Diagnosis Date  . CHF (congestive heart failure) (HCC)   . Diabetes mellitus without complication (HCC)   . Diverticulitis   . Hypertension     There are no active problems to display for this patient.   History reviewed. No pertinent surgical history.     Home Medications    Prior to Admission medications   Not on File    Family History No family history on file.  Social History Social History   Tobacco Use  . Smoking status: Former Games developermoker  . Smokeless tobacco: Never Used  Substance Use Topics  . Alcohol use: Yes  . Drug use: No     Allergies   Patient has no known allergies.   Review of Systems Review of Systems  Ten systems reviewed and are negative for acute change, except as noted in the HPI.   Physical Exam Updated Vital Signs BP (!) 177/118   Pulse 99   Temp 98.2 F (36.8 C) (Oral)   Resp (!) 22   Ht 5\' 9"  (1.753 m)    Wt 104.3 kg (230 lb)   SpO2 100%   BMI 33.97 kg/m   Physical Exam  Constitutional: He appears well-developed and well-nourished.  HENT:  Head: Normocephalic and atraumatic.  Eyes: EOM are normal. Pupils are equal, round, and reactive to light.  Neck: Normal range of motion. No JVD present.     ED Treatments / Results  Labs (all labs ordered are listed, but only abnormal results are displayed) Labs Reviewed  BASIC METABOLIC PANEL - Abnormal; Notable for the following components:      Result Value   Glucose, Bld 160 (*)    All other components within normal limits  CBC - Abnormal; Notable for the following components:   Hemoglobin 12.5 (*)    HCT 36.5 (*)    All other components within normal limits  BRAIN NATRIURETIC PEPTIDE - Abnormal; Notable for the following components:   B Natriuretic Peptide 340.3 (*)    All other components within normal limits  I-STAT TROPONIN, ED    EKG  EKG Interpretation None       Radiology Dg Chest 2 View  Result Date: 08/24/2017 CLINICAL DATA:  Cough and congestion EXAM: CHEST  2 VIEW COMPARISON:  None. FINDINGS: There is no edema or consolidation. Heart is mildly enlarged with pulmonary vascularity within normal limits. No adenopathy. No bone  lesions. IMPRESSION: Mild cardiomegaly.  No edema or consolidation. Electronically Signed   By: Bretta Bang III M.D.   On: 08/24/2017 15:27    Procedures .Critical Care Performed by: Arthor Captain, PA-C Authorized by: Arthor Captain, PA-C   Critical care provider statement:    Critical care time (minutes):  70   Critical care time was exclusive of:  Separately billable procedures and treating other patients   Critical care was necessary to treat or prevent imminent or life-threatening deterioration of the following conditions:  Respiratory failure   Critical care was time spent personally by me on the following activities:  Development of treatment plan with patient or surrogate,  re-evaluation of patient's condition, pulse oximetry, ordering and review of radiographic studies, ordering and review of laboratory studies, ordering and performing treatments and interventions, obtaining history from patient or surrogate, interpretation of cardiac output measurements, examination of patient, evaluation of patient's response to treatment and discussions with consultants   (including critical care time)  Medications Ordered in ED Medications  magnesium sulfate IVPB 2 g 50 mL (not administered)  methylPREDNISolone sodium succinate (SOLU-MEDROL) 125 mg/2 mL injection 125 mg (not administered)  ipratropium-albuterol (DUONEB) 0.5-2.5 (3) MG/3ML nebulizer solution 3 mL (not administered)  albuterol (PROVENTIL) (2.5 MG/3ML) 0.083% nebulizer solution 5 mg (5 mg Nebulization Given 08/24/17 1414)     Initial Impression / Assessment and Plan / ED Course  I have reviewed the triage vital signs and the nursing notes.  Pertinent labs & imaging results that were available during my care of the patient were reviewed by me and considered in my medical decision making (see chart for details).  Clinical Course as of Aug 24 2232  Wed Aug 24, 2017  1932 Patient is still wheeing throughout lung fields and markedly sob. Will initiate hour long neb treatment.  [AH]  2058 Patient still tachypneic and working hard to breath with accessory mm use. His wheezing improved and I am able to hear crackles in the bases. He will be given 20 of Iv Lasix  [AH]    Clinical Course User Index [AH] Arthor Captain, PA-C     patient with mixed CHF exacerbation in the face of status asthmaticus. Patient improved but still labored. I have ordered lasix for diuresis. Patient will need inpatient admission.  Final Clinical Impressions(s) / ED Diagnoses   Final diagnoses:  Acute on chronic congestive heart failure, unspecified heart failure type (HCC)  Asthma with status asthmaticus, unspecified asthma severity,  unspecified whether persistent    ED Discharge Orders    None       Arthor Captain, PA-C 08/24/17 2234    Jacalyn Lefevre, MD 08/24/17 2247

## 2017-08-24 NOTE — H&P (Signed)
History and Physical    Kenneth Rios GNF:[id number] DOB: 12/27/1965 DOA: 08/24/2017  PCP: Patient, No Pcp Per   Patient coming from: Home  Chief Complaint: SOB   HPI: Kenneth Rios is a 52 y.o. male with medical history significant for chronic CHF, hypertension, type 2 diabetes mellitus, now presenting to the emergency department for evaluation of dyspnea.  Patient reports that he recently moved and has been out of his medications for a few weeks.  He remained in his usual state of fairly good health until developing upper respiratory symptoms approximately 2 weeks ago including rhinorrhea, dry cough, and sore throat.  He had been taking over-the-counter medications including TheraFlu and Alka-Seltzer, but with no relief.  Over the past 4 days, he has noted marked worsening in shortness of breath.  Cough continues to be nonproductive.  He denies fevers or chills.  Denies lower extremity swelling or tenderness.  He does report orthopnea and PND.  No sick contacts.   ED Course: Upon arrival to the ED, patient is found to be afebrile, saturating adequately on room air, tachypneic, and hypertensive 280/110.  EKG features a sinus rhythm with LVH.  Chest x-ray is notable for mild cardiomegaly without edema or consolidation.  Chemistry panel is unremarkable.  CBC features a slight normocytic anemia.  Troponin is undetectable and BNP is elevated to 340.  Patient was treated with duo nebs, continuous albuterol treatment, 125 mg IV Solu-Medrol, 2 g IV magnesium, and 20 mg IV Lasix in the ED.  He remains hemodynamically stable, but continues to be in respiratory distress while at rest and will be admitted to the telemetry unit for ongoing evaluation and management of this.  Review of Systems:  All other systems reviewed and apart from HPI, are negative.  Past Medical History:  Diagnosis Date  . CHF (congestive heart failure) (HCC)   . Diabetes mellitus without complication (HCC)   . Diverticulitis    . Hypertension     History reviewed. No pertinent surgical history.   reports that he has quit smoking. he has never used smokeless tobacco. He reports that he drinks alcohol. He reports that he does not use drugs.  No Known Allergies  Family History  Problem Relation Age of Onset  . Sudden Cardiac Death Neg Hx      Prior to Admission medications   Medication Sig Start Date End Date Taking? Authorizing Provider  atorvastatin (LIPITOR) 10 MG tablet Take 10 mg by mouth daily.   Yes [provider]  carvedilol (COREG CR) 10 MG 24 hr capsule Take 10 mg by mouth daily.   Yes [provider]  DiphenhydrAMINE HCl (THERAFLU MULTI SYMPTOM PO) Take 30 mLs by mouth 2 (two) times daily.   Yes [provider]  furosemide (LASIX) 20 MG tablet Take 20 mg by mouth daily.   Yes [provider]  lisinopril (PRINIVIL,ZESTRIL) 10 MG tablet Take 10 mg by mouth daily.   Yes [provider]  metFORMIN (GLUCOPHAGE) 500 MG tablet Take 500 mg by mouth 2 (two) times daily with a meal.   Yes [provider]  sodium-potassium bicarbonate (ALKA-SELTZER GOLD) TBEF dissolvable tablet Take 1 tablet by mouth daily as needed (cold/cough).   Yes [provider]    Physical Exam: Vitals:   08/24/17 1915 08/24/17 1930 08/24/17 1945 08/24/17 2000  BP: (!) 168/115 (!) 181/108  (!) 183/113  Pulse: 93 93  94  Resp:      Temp:  TempSrc:      SpO2: 97% 98% 97% 98%  Weight:      Height:          Constitutional: Tachypneic, dyspneic, no pallor, no diaphoresis Eyes: PERTLA, lids and conjunctivae normal ENMT: Mucous membranes are moist. Posterior pharynx clear of any exudate or lesions.   Neck: normal, supple, no masses, no thyromegaly Respiratory: Rales at bases and scattered rhonchi. Increased WOB. No pallor or cyanosis.  Cardiovascular: S1 & S2 heard, regular rate and rhythm. No extremity edema.  Abdomen: No distension, no tenderness, no masses  palpated. Bowel sounds normal.  Musculoskeletal: no clubbing / cyanosis. No joint deformity upper and lower extremities.    Skin: no significant rashes, lesions, ulcers. Warm, dry, well-perfused. Neurologic: CN 2-12 grossly intact. Sensation intact. Strength 5/5 in all 4 limbs.  Psychiatric: Alert and oriented x 3. Pleasant and cooperative.     Labs on Admission: I have personally reviewed following labs and imaging studies  CBC: Recent Labs  Lab 08/24/17 1416  WBC 6.6  HGB 12.5*  HCT 36.5*  MCV 82.0  PLT 323   Basic Metabolic Panel: Recent Labs  Lab 08/24/17 1416  NA 141  K 4.1  CL 108  CO2 25  GLUCOSE 160*  BUN 8  CREATININE 1.05  CALCIUM 9.1   GFR: Estimated Creatinine Clearance: 99 mL/min (by C-G formula based on SCr of 1.05 mg/dL). Liver Function Tests: No results for input(s): AST, ALT, ALKPHOS, BILITOT, PROT, ALBUMIN in the last 168 hours. No results for input(s): LIPASE, AMYLASE in the last 168 hours. No results for input(s): AMMONIA in the last 168 hours. Coagulation Profile: No results for input(s): INR, PROTIME in the last 168 hours. Cardiac Enzymes: No results for input(s): CKTOTAL, CKMB, CKMBINDEX, TROPONINI in the last 168 hours. BNP (last 3 results) No results for input(s): PROBNP in the last 8760 hours. HbA1C: No results for input(s): HGBA1C in the last 72 hours. CBG: No results for input(s): GLUCAP in the last 168 hours. Lipid Profile: No results for input(s): CHOL, HDL, LDLCALC, TRIG, CHOLHDL, LDLDIRECT in the last 72 hours. Thyroid Function Tests: No results for input(s): TSH, T4TOTAL, FREET4, T3FREE, THYROIDAB in the last 72 hours. Anemia Panel: No results for input(s): VITAMINB12, FOLATE, FERRITIN, TIBC, IRON, RETICCTPCT in the last 72 hours. Urine analysis:    Component Value Date/Time   COLORURINE Yellow 12/11/2013 0100   APPEARANCEUR Clear 12/11/2013 0100   LABSPEC 1.054 12/11/2013 0100   PHURINE 7.0 12/11/2013 0100   GLUCOSEU  Negative 12/11/2013 0100   HGBUR Negative 12/11/2013 0100   BILIRUBINUR Negative 12/11/2013 0100   KETONESUR Trace 12/11/2013 0100   PROTEINUR 30 mg/dL 16/10/960405/12/2013 54090100   NITRITE Negative 12/11/2013 0100   LEUKOCYTESUR Negative 12/11/2013 0100   Sepsis Labs: @LABRCNTIP (procalcitonin:4,lacticidven:4) )No results found for this or any previous visit (from the past 240 hour(s)).   Radiological Exams on Admission: Dg Chest 2 View  Result Date: 08/24/2017 CLINICAL DATA:  Cough and congestion EXAM: CHEST  2 VIEW COMPARISON:  None. FINDINGS: There is no edema or consolidation. Heart is mildly enlarged with pulmonary vascularity within normal limits. No adenopathy. No bone lesions. IMPRESSION: Mild cardiomegaly.  No edema or consolidation. Electronically Signed   By: Bretta BangWilliam  Woodruff III M.D.   On: 08/24/2017 15:27    EKG: Independently reviewed. Sinus rhythm, LVH, QTc 505 ms.   Assessment/Plan  1. Respiratory distress - Presents with respiratory distress, reportedly wheezing on arrival  - Was preceded by 2 wks of URI  sxs, but he also notes orthopnea and PND  - Has been out of medications, including Lasix, for past few weeks  - Suspect acute on chronic CHF, though with reported bronchospasm on arrival, new COPD or acute viral illness also considered  - Check respiratory virus panel, diurese, continue prn nebs as discussed below    2. Acute on chronic CHF  - Presents in respiratory distress, reports orthopnea and PND, BNP elevated, no significant peripheral edema noted, no edema noted on CXR  - Carries diagnosis of chronic CHF, but no echo report on file  - Treated with 20 mg IV Lasix in ED  - Continue diuresis with Lasix 40 mg IV q12h, SLIV, fluid-restrict diet, follow daily wts and I/O's, cardiac monitoring, daily chem panel, echocardiogram  - Continue Coreg and lisinopril   3. Bronchospasm  - Presents in acute respiratory distress; reportedly wheezing throughout on arrival  - Denies  hx of asthma or COPD; is a former smoker  - Denies improvement with nebs and 125 mg IV Solu-Medrol in ED, though wheezing appears to have resolved  - Continue prn nebs, continue systemic steroid, follow respiratory virus panel    4. Hypertension; hypertensive urgency  - BP in 180/110 range in ED, likely secondary to acute CHF  - Anticipate improvement with diuresis  - Resume Coreg and lisinopril, use hydralazine IVP's prn    5. Type II DM  - A1c was 7.1% remotely  - Managed with metformin at home, held on admission - Follow CBG's and start SSI with Novolog    DVT prophylaxis: Lovenox Code Status: Full  Family Communication: Discussed with patient Disposition Plan: Admit to telemetry Consults called: None Admission status: Inpatient    Briscoe Deutscher, MD [facility] Pager [phone]  If 7PM-7AM, please contact night-coverage [email or web address] Password [facility]  08/24/2017, 9:49 PM

## 2017-08-24 NOTE — ED Notes (Signed)
The pt continues to have difficulty breathing

## 2017-08-24 NOTE — ED Notes (Signed)
PT STILL HAS DIFFICULTY BREATHING

## 2017-08-24 NOTE — ED Notes (Signed)
Meal given after the cbg was checked

## 2017-08-24 NOTE — ED Triage Notes (Signed)
Pt endorses cough and congestion starting 2 weeks ago and been progressively worse pt sts has been self treating with alkaseltzer cold, theraflu without relief. Pt endorses chest congestion and shob starting 4 days ago.

## 2017-08-24 NOTE — ED Notes (Signed)
Pt getting a hour long hhn  He reports that he still has difficulty breathing and he does not ffeel like he is any better

## 2017-08-24 NOTE — ED Provider Notes (Signed)
Patient placed in Quick Look pathway, seen and evaluated   Chief Complaint: sob  HPI:  Hx of CHF, out of medications for several months, and now having progressive worsening SOB x 2 week, PND  ROS: no fever, n/v/d  Physical Exam:   Gen: No distress  Neuro: Awake and Alert  Skin: Warm    Focused Exam: neck with JVD, heart S1/S2 with gallops, Lungs with faint wheezes, along with crackles to lung bases, abd soft, no LE edema.    Initiation of care has begun. The patient has been counseled on the process, plan, and necessity for staying for the completion/evaluation, and the remainder of the medical screening examination    Fayrene Helperran, Orit Sanville, Cordelia Poche-C 08/24/17 1436    Margarita Grizzleay, Danielle, MD 08/26/17 1105

## 2017-08-24 NOTE — ED Notes (Signed)
Lab work, radiology results and vital signs reviewed, no critical results at this time, no change in acuity indicated. Awaiting additional lab results.

## 2017-08-25 ENCOUNTER — Other Ambulatory Visit (HOSPITAL_COMMUNITY): Payer: Self-pay

## 2017-08-25 ENCOUNTER — Other Ambulatory Visit: Payer: Self-pay

## 2017-08-25 DIAGNOSIS — I5033 Acute on chronic diastolic (congestive) heart failure: Secondary | ICD-10-CM

## 2017-08-25 DIAGNOSIS — J45902 Unspecified asthma with status asthmaticus: Secondary | ICD-10-CM

## 2017-08-25 LAB — RESPIRATORY PANEL BY PCR
Adenovirus: NOT DETECTED
Bordetella pertussis: NOT DETECTED
CORONAVIRUS OC43-RVPPCR: NOT DETECTED
Chlamydophila pneumoniae: NOT DETECTED
Coronavirus 229E: NOT DETECTED
Coronavirus HKU1: NOT DETECTED
Coronavirus NL63: NOT DETECTED
INFLUENZA A-RVPPCR: NOT DETECTED
INFLUENZA B-RVPPCR: NOT DETECTED
MYCOPLASMA PNEUMONIAE-RVPPCR: NOT DETECTED
Metapneumovirus: NOT DETECTED
PARAINFLUENZA VIRUS 1-RVPPCR: NOT DETECTED
PARAINFLUENZA VIRUS 4-RVPPCR: NOT DETECTED
Parainfluenza Virus 2: NOT DETECTED
Parainfluenza Virus 3: NOT DETECTED
RESPIRATORY SYNCYTIAL VIRUS-RVPPCR: NOT DETECTED
Rhinovirus / Enterovirus: DETECTED — AB

## 2017-08-25 LAB — CBC WITH DIFFERENTIAL/PLATELET
BASOS PCT: 0 %
Basophils Absolute: 0 10*3/uL (ref 0.0–0.1)
Eosinophils Absolute: 0 10*3/uL (ref 0.0–0.7)
Eosinophils Relative: 0 %
HEMATOCRIT: 37.2 % — AB (ref 39.0–52.0)
HEMOGLOBIN: 12.9 g/dL — AB (ref 13.0–17.0)
LYMPHS PCT: 9 %
Lymphs Abs: 0.6 10*3/uL — ABNORMAL LOW (ref 0.7–4.0)
MCH: 28.2 pg (ref 26.0–34.0)
MCHC: 34.7 g/dL (ref 30.0–36.0)
MCV: 81.4 fL (ref 78.0–100.0)
MONOS PCT: 1 %
Monocytes Absolute: 0.1 10*3/uL (ref 0.1–1.0)
NEUTROS ABS: 6.3 10*3/uL (ref 1.7–7.7)
NEUTROS PCT: 90 %
Platelets: 327 10*3/uL (ref 150–400)
RBC: 4.57 MIL/uL (ref 4.22–5.81)
RDW: 12.8 % (ref 11.5–15.5)
WBC: 7 10*3/uL (ref 4.0–10.5)

## 2017-08-25 LAB — BASIC METABOLIC PANEL
ANION GAP: 13 (ref 5–15)
BUN: 14 mg/dL (ref 6–20)
CALCIUM: 9.1 mg/dL (ref 8.9–10.3)
CHLORIDE: 103 mmol/L (ref 101–111)
CO2: 21 mmol/L — AB (ref 22–32)
Creatinine, Ser: 1.07 mg/dL (ref 0.61–1.24)
GFR calc non Af Amer: >60 mL/min (ref >60)
Glucose, Bld: 275 mg/dL — ABNORMAL HIGH (ref 65–99)
POTASSIUM: 4 mmol/L (ref 3.5–5.1)
Sodium: 137 mmol/L (ref 135–145)

## 2017-08-25 LAB — HEMOGLOBIN A1C
Hgb A1c MFr Bld: 7.8 % — ABNORMAL HIGH (ref 4.8–5.6)
MEAN PLASMA GLUCOSE: 177.16 mg/dL

## 2017-08-25 LAB — CBG MONITORING, ED
GLUCOSE-CAPILLARY: 282 mg/dL — AB (ref 65–99)
Glucose-Capillary: 300 mg/dL — ABNORMAL HIGH (ref 65–99)
Glucose-Capillary: 187 mg/dL — ABNORMAL HIGH (ref 65–99)
Glucose-Capillary: 307 mg/dL — ABNORMAL HIGH (ref 65–99)

## 2017-08-25 LAB — GLUCOSE, CAPILLARY
Glucose-Capillary: 241 mg/dL — ABNORMAL HIGH (ref 65–99)
Glucose-Capillary: 271 mg/dL — ABNORMAL HIGH (ref 65–99)

## 2017-08-25 LAB — D-DIMER, QUANTITATIVE: D-Dimer, Quant: 0.63 ug/mL-FEU — ABNORMAL HIGH (ref 0.00–0.50)

## 2017-08-25 LAB — HIV ANTIBODY (ROUTINE TESTING W REFLEX): HIV Screen 4th Generation wRfx: NONREACTIVE

## 2017-08-25 MED ORDER — INSULIN GLARGINE 100 UNIT/ML ~~LOC~~ SOLN
20.0000 [IU] | Freq: Every day | SUBCUTANEOUS | Status: DC
  Administered 2017-08-26 – 2017-08-27 (×2): 20 [IU] via SUBCUTANEOUS
  Filled 2017-08-25 (×3): qty 0.2

## 2017-08-25 MED ORDER — AZITHROMYCIN 250 MG PO TABS
500.0000 mg | ORAL_TABLET | Freq: Every day | ORAL | Status: DC
  Administered 2017-08-25 – 2017-08-28 (×4): 500 mg via ORAL
  Filled 2017-08-25 (×4): qty 2

## 2017-08-25 MED ORDER — INFLUENZA VAC SPLIT QUAD 0.5 ML IM SUSY
0.5000 mL | PREFILLED_SYRINGE | INTRAMUSCULAR | Status: AC
  Administered 2017-08-26: 0.5 mL via INTRAMUSCULAR
  Filled 2017-08-25: qty 0.5

## 2017-08-25 MED ORDER — IOPAMIDOL (ISOVUE-300) INJECTION 61%
INTRAVENOUS | Status: AC
  Filled 2017-08-25: qty 100

## 2017-08-25 MED ORDER — HYDROCODONE-HOMATROPINE 5-1.5 MG/5ML PO SYRP
5.0000 mL | ORAL_SOLUTION | Freq: Four times a day (QID) | ORAL | Status: DC | PRN
  Administered 2017-08-25 – 2017-08-27 (×6): 5 mL via ORAL
  Filled 2017-08-25 (×7): qty 5

## 2017-08-25 MED ORDER — PNEUMOCOCCAL VAC POLYVALENT 25 MCG/0.5ML IJ INJ
0.5000 mL | INJECTION | INTRAMUSCULAR | Status: AC
  Administered 2017-08-26: 0.5 mL via INTRAMUSCULAR
  Filled 2017-08-25: qty 0.5

## 2017-08-25 NOTE — Progress Notes (Signed)
D-dimer = 0.63. MD paged. Orders received for CT angio. Will continue to monitor.

## 2017-08-25 NOTE — Progress Notes (Signed)
[facility] PROGRESS NOTE  Kenneth Rios ZOX:[id number] DOB: 03-19-1966 DOA: 08/24/2017   PCP: Patient, No Pcp Per     Assessment/Plan: Principal Problem:   CHF, acute on chronic (HCC) Active Problems:   Diabetes mellitus without complication (HCC)   Hypertension   Bronchospasm   Hypertensive urgency   52 y.o. male with medical history significant for chronic CHF, hypertension, type 2 diabetes mellitus, now presenting to the emergency department for evaluation of dyspnea in setting of hypertensive urgency and COPD exacerbation .  Assessment and plan   1. Respiratory distress, likely secondary to acute bronchitis - Presents with respiratory distress, reportedly wheezing on arrival  - Was preceded by 2 wks of URI sxs, but he also notes orthopnea and PND , respiratory panel is pending  - Has been out of medications, including Lasix, for past few weeks , now started on diuresis for CHF exacerbation - Suspect acute on chronic CHF, type unknown as the patient has not had a recent echo, in addition patient also reported bronchospasm on arrival, new COPD or acute viral illness also in the differential Continue diurese, continue prn nebs as discussed below   Add azithromycin and cough medicine due to spasmodic cough  2. Acute on chronic CHF , type unknown, echo pending - Presents in respiratory distress, reports orthopnea and PND, BNP elevated, no significant peripheral edema noted, no edema noted on CXR  - Carries diagnosis of chronic CHF, but no echo report on file  - Continue diuresis with Lasix 40 mg IV q12h, SLIV, fluid-restrict diet, follow daily wts and I/O's, cardiac monitoring, daily chem panel, echocardiogram  - Continue Coreg and lisinopril   3. Bronchospasm  - Presents in acute respiratory distress; reportedly wheezing throughout on arrival  - Denies hx of asthma or COPD; is a former smoker  - Denies improvement with nebs and 125 mg IV Solu-Medrol in ED,  continue IV steroids as the patient is still wheezing  - Continue prn nebs,  follow respiratory virus panel    4. Hypertension; hypertensive urgency  - BP in 180/110 range in ED, likely secondary to acute CHF  - Anticipate improvement with diuresis /steroids - Resume Coreg and lisinopril, use hydralazine IVP's prn    5. Type II DM -uncontrolled secondary to steroids -  recheck hemoglobin A1c - Managed with metformin at home, held on admission - Follow CBG's and start SSI with Novolog    Add Lantus for optimal control     DVT prophylaxsis lovenox   Code Status:  Full code    Family Communication: Discussed in detail with the patient, all imaging results, lab results explained to the patient   Disposition Plan: 1-2 days       Consultants:  None   Procedures:  None   Antibiotics: Anti-infectives (From admission, onward)   None         HPI/Subjective: Unable to rest due to excessive coughing, shortness of breath, wheezing  Objective: Vitals:   08/25/17 0700 08/25/17 0715 08/25/17 0745 08/25/17 0815  BP: (!) 166/93 (!) 162/82 (!) 163/97 (!) 155/90  Pulse: 87 87 86 85  Resp: (!) 35 (!) 28    Temp:      TempSrc:      SpO2: 97% 96% 96% 95%  Weight:      Height:        Intake/Output Summary (Last 24 hours) at 08/25/2017 1017 Last data filed at 08/25/2017 0504 Gross per 24 hour  Intake 600 ml  Output 4185 ml  Net -3585 ml    Exam:  Examination:  General exam: Appears calm and comfortable  Respiratory system: Bilateral wheezing to auscultation. Respiratory effort normal. Cardiovascular system: S1 & S2 heard, RRR. No JVD, murmurs, rubs, gallops or clicks. No pedal edema. Gastrointestinal system: Abdomen is nondistended, soft and nontender. No organomegaly or masses felt. Normal bowel sounds heard. Central nervous system: Alert and oriented. No focal neurological deficits. Extremities: Symmetric 5 x 5 power. Skin: No rashes, lesions or  ulcers Psychiatry: Judgement and insight appear normal. Mood & affect appropriate.     Data Reviewed: I have personally reviewed following labs and imaging studies  Micro Results No results found for this or any previous visit (from the past 240 hour(s)).  Radiology Reports Dg Chest 2 View  Result Date: 08/24/2017 CLINICAL DATA:  Cough and congestion EXAM: CHEST  2 VIEW COMPARISON:  None. FINDINGS: There is no edema or consolidation. Heart is mildly enlarged with pulmonary vascularity within normal limits. No adenopathy. No bone lesions. IMPRESSION: Mild cardiomegaly.  No edema or consolidation. Electronically Signed   By: Bretta BangWilliam  Woodruff III M.D.   On: 08/24/2017 15:27     CBC Recent Labs  Lab 08/24/17 1416 08/25/17 0407  WBC 6.6 7.0  HGB 12.5* 12.9*  HCT 36.5* 37.2*  PLT 323 327  MCV 82.0 81.4  MCH 28.1 28.2  MCHC 34.2 34.7  RDW 12.8 12.8  LYMPHSABS  --  0.6*  MONOABS  --  0.1  EOSABS  --  0.0  BASOSABS  --  0.0    Chemistries  Recent Labs  Lab 08/24/17 1416 08/25/17 0407  NA 141 137  K 4.1 4.0  CL 108 103  CO2 25 21*  GLUCOSE 160* 275*  BUN 8 14  CREATININE 1.05 1.07  CALCIUM 9.1 9.1   ------------------------------------------------------------------------------------------------------------------ estimated creatinine clearance is 97.2 mL/min (by C-G formula based on SCr of 1.07 mg/dL). ------------------------------------------------------------------------------------------------------------------ No results for input(s): HGBA1C in the last 72 hours. ------------------------------------------------------------------------------------------------------------------ No results for input(s): CHOL, HDL, LDLCALC, TRIG, CHOLHDL, LDLDIRECT in the last 72 hours. ------------------------------------------------------------------------------------------------------------------ No results for input(s): TSH, T4TOTAL, T3FREE, THYROIDAB in the last 72  hours.  Invalid input(s): FREET3 ------------------------------------------------------------------------------------------------------------------ No results for input(s): VITAMINB12, FOLATE, FERRITIN, TIBC, IRON, RETICCTPCT in the last 72 hours.  Coagulation profile No results for input(s): INR, PROTIME in the last 168 hours.  No results for input(s): DDIMER in the last 72 hours.  Cardiac Enzymes No results for input(s): CKMB, TROPONINI, MYOGLOBIN in the last 168 hours.  Invalid input(s): CK ------------------------------------------------------------------------------------------------------------------ Invalid input(s): POCBNP   CBG: Recent Labs  Lab 08/24/17 2249 08/25/17 0244 08/25/17 0730  GLUCAP 281* 300* 187*       Studies: Dg Chest 2 View  Result Date: 08/24/2017 CLINICAL DATA:  Cough and congestion EXAM: CHEST  2 VIEW COMPARISON:  None. FINDINGS: There is no edema or consolidation. Heart is mildly enlarged with pulmonary vascularity within normal limits. No adenopathy. No bone lesions. IMPRESSION: Mild cardiomegaly.  No edema or consolidation. Electronically Signed   By: Bretta BangWilliam  Woodruff III M.D.   On: 08/24/2017 15:27      Lab Results  Component Value Date   HGBA1C 7.1 (H) 03/15/2014   Lab Results  Component Value Date   CREATININE 1.07 08/25/2017       Scheduled Meds: . atorvastatin  10 mg Oral Daily  . carvedilol  10 mg Oral Daily  . enoxaparin (LOVENOX) injection  40 mg Subcutaneous Q24H  .  furosemide  20 mg Intravenous Once  . furosemide  40 mg Intravenous Q12H  . insulin aspart  0-9 Units Subcutaneous Q4H  . lisinopril  10 mg Oral Daily  . methylPREDNISolone (SOLU-MEDROL) injection  40 mg Intravenous Q8H  . sodium chloride flush  3 mL Intravenous Q12H   Continuous Infusions: . sodium chloride       LOS: 1 day    Time spent: >30 MINS    Richarda Overlie  [facility] Pager [phone]. If 7PM-7AM, please contact night-coverage  at [email or web address], password [facility] 08/25/2017, 10:17 AM  LOS: 1 day

## 2017-08-25 NOTE — ED Notes (Signed)
The  Pt has finally slowed down his respirations.  Flu swab  collected

## 2017-08-25 NOTE — ED Notes (Signed)
[facility] BED ORDERED

## 2017-08-26 ENCOUNTER — Ambulatory Visit: Payer: Self-pay | Admitting: Internal Medicine

## 2017-08-26 ENCOUNTER — Inpatient Hospital Stay (HOSPITAL_COMMUNITY): Payer: Medicaid Other

## 2017-08-26 ENCOUNTER — Encounter (HOSPITAL_COMMUNITY): Payer: Self-pay | Admitting: Radiology

## 2017-08-26 LAB — COMPREHENSIVE METABOLIC PANEL
ALBUMIN: 4 g/dL (ref 3.5–5.0)
ALK PHOS: 73 U/L (ref 38–126)
ALT: 34 U/L (ref 17–63)
ANION GAP: 11 (ref 5–15)
AST: 18 U/L (ref 15–41)
BILIRUBIN TOTAL: 0.7 mg/dL (ref 0.3–1.2)
BUN: 25 mg/dL — AB (ref 6–20)
CALCIUM: 9.3 mg/dL (ref 8.9–10.3)
CO2: 21 mmol/L — AB (ref 22–32)
Chloride: 101 mmol/L (ref 101–111)
Creatinine, Ser: 1.1 mg/dL (ref 0.61–1.24)
GFR calc Af Amer: >60 mL/min (ref >60)
GFR calc non Af Amer: >60 mL/min (ref >60)
GLUCOSE: 261 mg/dL — AB (ref 65–99)
Potassium: 4.2 mmol/L (ref 3.5–5.1)
Sodium: 133 mmol/L — ABNORMAL LOW (ref 135–145)
TOTAL PROTEIN: 7.5 g/dL (ref 6.5–8.1)

## 2017-08-26 LAB — GLUCOSE, CAPILLARY
GLUCOSE-CAPILLARY: 236 mg/dL — AB (ref 65–99)
GLUCOSE-CAPILLARY: 247 mg/dL — AB (ref 65–99)
GLUCOSE-CAPILLARY: 273 mg/dL — AB (ref 65–99)
GLUCOSE-CAPILLARY: 323 mg/dL — AB (ref 65–99)
Glucose-Capillary: 422 mg/dL — ABNORMAL HIGH (ref 65–99)
Glucose-Capillary: 454 mg/dL — ABNORMAL HIGH (ref 65–99)

## 2017-08-26 MED ORDER — SODIUM CHLORIDE 3 % IN NEBU
4.0000 mL | INHALATION_SOLUTION | Freq: Every day | RESPIRATORY_TRACT | Status: AC
  Administered 2017-08-26 – 2017-08-27 (×2): 4 mL via RESPIRATORY_TRACT
  Filled 2017-08-26 (×3): qty 4

## 2017-08-26 MED ORDER — IOPAMIDOL (ISOVUE-370) INJECTION 76%
INTRAVENOUS | Status: AC
  Administered 2017-08-26: 100 mL
  Filled 2017-08-26: qty 100

## 2017-08-26 MED ORDER — DIPHENHYDRAMINE HCL 50 MG/ML IJ SOLN
12.5000 mg | Freq: Once | INTRAMUSCULAR | Status: AC
  Administered 2017-08-26: 12.5 mg via INTRAVENOUS
  Filled 2017-08-26: qty 1

## 2017-08-26 MED ORDER — ALBUTEROL SULFATE (2.5 MG/3ML) 0.083% IN NEBU: 2.5000 mg | INHALATION_SOLUTION | RESPIRATORY_TRACT | Status: DC | PRN

## 2017-08-26 MED ORDER — IPRATROPIUM-ALBUTEROL 0.5-2.5 (3) MG/3ML IN SOLN
3.0000 mL | Freq: Four times a day (QID) | RESPIRATORY_TRACT | Status: DC
  Administered 2017-08-26 – 2017-08-27 (×7): 3 mL via RESPIRATORY_TRACT
  Filled 2017-08-26 (×6): qty 3

## 2017-08-26 MED ORDER — METOCLOPRAMIDE HCL 5 MG/ML IJ SOLN
5.0000 mg | Freq: Once | INTRAMUSCULAR | Status: AC
  Administered 2017-08-26: 5 mg via INTRAVENOUS
  Filled 2017-08-26: qty 2

## 2017-08-26 MED ORDER — KETOROLAC TROMETHAMINE 15 MG/ML IJ SOLN
15.0000 mg | Freq: Once | INTRAMUSCULAR | Status: AC
  Administered 2017-08-26: 15 mg via INTRAVENOUS
  Filled 2017-08-26: qty 1

## 2017-08-26 NOTE — Care Management Note (Signed)
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 He can get his medication there at discharge also. Lots of questions about his diet; Nutritional consult placed for making wise food choices. CM will continue to follow for progression of care  Expected Discharge Date:   possibly 08/30/2017               Expected Discharge Plan:  Home/Self Care  In-House Referral:   Nutrietion  Discharge planning Services  CM Consult  Status of Service:  In process, will continue to follow  Reola MosherChandler, Fadil Macmaster L, RN,MHA,BSN [phone] 08/26/2017, 10:37 AM

## 2017-08-26 NOTE — Progress Notes (Signed)
Nutrition Education Note  RD consulted for diet education. Per RNCM notes, pt with multiple questions regarding diet.   Lab Results  Component Value Date   HGBA1C 7.8 (H) 08/25/2017   Lipid Panel  No results found for: CHOL, TRIG, HDL, CHOLHDL, VLDL, LDLCALC, LDLDIRECT  Home DM medications are 500 mg metformin BID.   Spoke with pt at bedside, who reports good appetite. He states he eats "constantly" (meals consist of burgers and fries, pizza, or meat, starch, and vegetable). Pt reports his roommate prepares meals and goes grocery shopping for him. Pt denies any questions about diet and reports roommate requested the consult. Provided "Heart Healthy, Consistent Carbohydrate Nutrition Therapy" handout from [facility] Nutrition Care Manual. Offered to review information with pt, however, he declined ("I'm just trying to sleep right now").   Body mass index is 31.19 kg/m. Patient meets criteria for obesity, class I based on current BMI.   Current diet order is Heart Healthy/ Carb Modified, patient is consuming approximately 100% of meals at this time. Labs and medications reviewed.   No nutrition interventions warranted at this time. If nutrition issues arise, please consult RD.   Johnesha Acheampong A. Mayford KnifeWilliams, RD, LDN, CDE Pager: [phone] After hours Pager: [phone]

## 2017-08-26 NOTE — Progress Notes (Signed)
Inpatient Diabetes Program Recommendations  AACE/ADA: New Consensus Statement on Inpatient Glycemic Control (2015)  Target Ranges:  Prepandial:   less than 140 mg/dL      Peak postprandial:   less than 180 mg/dL (1-2 hours)      Critically ill patients:  140 - 180 mg/dL   Lab Results  Component Value Date   GLUCAP 454 (H) 08/26/2017   HGBA1C 7.8 (H) 08/25/2017    Review of Glycemic Control  Diabetes history: DM2 Outpatient Diabetes medications: metformin Current orders for Inpatient glycemic control: Lantus 20 units QD, Novolog 0-9 units Q4H  Inpatient Diabetes Program Recommendations:     Increase Lantus to 24 units QD Change Novolog to 0-9 units tidwc Add meal coverage insulin - 4 units tidwc.  Per MD, pt will not be discharged on insulin. Will send Living Well with Diabetes book and order diabetes videos from pt ed network.  Continue to follow.  Thank you. Kenneth Rios, RD, LDN, CDE Inpatient Diabetes Coordinator [phone]

## 2017-08-26 NOTE — Progress Notes (Addendum)
[facility] PROGRESS NOTE  Unice Cobblenthony L Baar WUJ:[id number] DOB: 09/28/1965 DOA: 08/24/2017   PCP: Patient, No Pcp Per   Assessment/Plan: Principal Problem:   CHF, acute on chronic (HCC) Active Problems:   Diabetes mellitus without complication (HCC)   Hypertension   Bronchospasm   Hypertensive urgency   52 y.o. male with medical history significant for chronic CHF, hypertension, type 2 diabetes mellitus, now presenting to the emergency department for evaluation of dyspnea in setting of hypertensive urgency and COPD exacerbation. Denies recent tobacco use or second hand smoking exposure. Rhinovirus positive. Pulmonary toilet added.  Pt seen and examined with his wife at his bedside. Reports no improvement in his breathing. Cough is persistent with wheezing on exam. Added round the clock breathing treatments, hypersaline nebs and chest PT. On IV solumedrol TID.  Assessment and plan   1. Acute respiratory distress, likely secondary to acute bronchitis - Presents with respiratory distress, reportedly wheezing on arrival  - Was preceded by 2 wks of URI sxs, but he also notes orthopnea and PND, respiratory panel is positive for rhinovirus - azithromycin and cough medicine due to spasmodic cough - duonebs q6h q2h prn, IV solumedrol, hypersaline nebs, chest PT - O2 supplement to maintain O2 sat 92% or greater  2. Acute on chronic CHF , type unknown, echo pending - Presents in respiratory distress, reports orthopnea and PND, BNP elevated, no significant peripheral edema noted, no edema noted on CXR  - Carries diagnosis of chronic CHF, but no echo report on file  - Continue diuresis with Lasix 40 mg IV q12h, SLIV, fluid-restrict diet, follow daily wts and I/O's, cardiac monitoring, daily chem panel, echocardiogram  - Continue Coreg and lisinopril   3. Bronchospasm  - Presents in acute respiratory distress; reportedly wheezing throughout on arrival  - Denies hx of asthma or  COPD; is a former smoker  - Denies improvement with nebs and 125 mg IV Solu-Medrol in ED, continue IV steroids as the patient is still wheezing  - Continue prn nebs  4. Hypertension; hypertensive urgency  - BP in 180/110 range in ED, likely secondary to acute CHF  - Anticipate improvement with diuresis /steroids - Resume Coreg and lisinopril, use hydralazine IVP's prn    5. Type II DM -uncontrolled secondary to steroids -  recheck hemoglobin A1c 7.8 - Managed with metformin at home - Follow CBG's and start SSI with Novolog  - Add Lantus for optimal control  6. Headache 2/2 to intractable cough -headache cocktail; lgiven -IV toradol, IV reglan, and IV benadryl   DVT prophylaxsis lovenox   Code Status:  Full code    Family Communication: Discussed in detail with the patient, all imaging results, lab results explained to the patient   Disposition Plan: will stay another midnight due to persistent dyspnea, wheezing, and persistent cough   Consultants:  None   Procedures:  None   Antibiotics: Anti-infectives (From admission, onward)   Start     Dose/Rate Route Frequency Ordered Stop   08/25/17 1230  azithromycin (ZITHROMAX) tablet 500 mg     500 mg Oral Daily 08/25/17 1227         Objective: Vitals:   08/25/17 1452 08/25/17 1928 08/26/17 0010 08/26/17 0514  BP: (!) 164/93 (!) 170/116 (!) 167/95 (!) 158/100  Pulse: 85 88 91 81  Resp: (!) 24 18  18   Temp: 98.2 F (36.8 C) 99.7 F (37.6 C)  98.8 F (37.1 C)  TempSrc: Oral Oral  Oral  SpO2: 98% 98% 97% 98%  Weight: 96.1 kg (211 lb 12.8 oz)   95.8 kg (211 lb 3.2 oz)  Height: 5\' 9"  (1.753 m)       Intake/Output Summary (Last 24 hours) at 08/26/2017 0730 Last data filed at 08/26/2017 1308 Gross per 24 hour  Intake 480 ml  Output 2045 ml  Net -1565 ml    Exam:  Examination:  General exam: Appears uncomfortable due to persistent cough and dyspnea Respiratory system: Bilateral wheezing to auscultation.  Respiratory effort normal. Cardiovascular system: S1 & S2 heard, RRR. No JVD, murmurs, rubs, gallops or clicks. No pedal edema. Gastrointestinal system: Abdomen is nondistended, soft and nontender. No organomegaly or masses felt. Normal bowel sounds heard. Central nervous system: Alert and oriented. No focal neurological deficits. Extremities: Symmetric 5 x 5 power. Skin: No rashes, lesions or ulcers Psychiatry: Judgement and insight appear normal. Mood & affect appropriate.     Data Reviewed: I have personally reviewed following labs and imaging studies  Micro Results Recent Results (from the past 240 hour(s))  Respiratory Panel by PCR     Status: Abnormal   Collection Time: 08/25/17  5:00 AM  Result Value Ref Range Status   Adenovirus NOT DETECTED NOT DETECTED Final   Coronavirus 229E NOT DETECTED NOT DETECTED Final   Coronavirus HKU1 NOT DETECTED NOT DETECTED Final   Coronavirus NL63 NOT DETECTED NOT DETECTED Final   Coronavirus OC43 NOT DETECTED NOT DETECTED Final   Metapneumovirus NOT DETECTED NOT DETECTED Final   Rhinovirus / Enterovirus DETECTED (A) NOT DETECTED Final   Influenza A NOT DETECTED NOT DETECTED Final   Influenza B NOT DETECTED NOT DETECTED Final   Parainfluenza Virus 1 NOT DETECTED NOT DETECTED Final   Parainfluenza Virus 2 NOT DETECTED NOT DETECTED Final   Parainfluenza Virus 3 NOT DETECTED NOT DETECTED Final   Parainfluenza Virus 4 NOT DETECTED NOT DETECTED Final   Respiratory Syncytial Virus NOT DETECTED NOT DETECTED Final   Bordetella pertussis NOT DETECTED NOT DETECTED Final   Chlamydophila pneumoniae NOT DETECTED NOT DETECTED Final   Mycoplasma pneumoniae NOT DETECTED NOT DETECTED Final    Radiology Reports Dg Chest 2 View  Result Date: 08/24/2017 CLINICAL DATA:  Cough and congestion EXAM: CHEST  2 VIEW COMPARISON:  None. FINDINGS: There is no edema or consolidation. Heart is mildly enlarged with pulmonary vascularity within normal limits. No  adenopathy. No bone lesions. IMPRESSION: Mild cardiomegaly.  No edema or consolidation. Electronically Signed   By: Bretta Bang III M.D.   On: 08/24/2017 15:27   Ct Angio Chest Pe W Or Wo Contrast  Result Date: 08/26/2017 CLINICAL DATA:  Dyspnea for 3-4 weeks.  Elevated D-dimer. EXAM: CT ANGIOGRAPHY CHEST WITH CONTRAST TECHNIQUE: Multidetector CT imaging of the chest was performed using the standard protocol during bolus administration of intravenous contrast. Multiplanar CT image reconstructions and MIPs were obtained to evaluate the vascular anatomy. CONTRAST:  58 cc ISOVUE-370 IOPAMIDOL (ISOVUE-370) INJECTION 76% COMPARISON:  08/24/2017 chest radiograph. FINDINGS: Cardiovascular: The study is low to moderate quality for the evaluation of pulmonary embolism, limited by slightly suboptimal contrast opacification and by motion artifact. There are no convincing filling defects in the central, lobar, segmental or subsegmental pulmonary artery branches to suggest acute pulmonary embolism. Mildly atherosclerotic nonaneurysmal thoracic aorta. Dilated main pulmonary artery (3.6 cm diameter). Mild cardiomegaly. Left anterior descending and right coronary atherosclerosis. No significant pericardial effusion/thickening. Mediastinum/Nodes: No discrete thyroid nodules. Unremarkable esophagus. No pathologically enlarged axillary, mediastinal or hilar lymph  nodes. Lungs/Pleura: No pneumothorax. No pleural effusion. Subsegmental bilateral lower lobe atelectasis. No acute consolidative airspace disease, lung masses or significant pulmonary nodules. Minimal scattered interlobular septal thickening in both lungs. Upper abdomen: No acute abnormality. Musculoskeletal: No aggressive appearing focal osseous lesions. Minimal thoracic spondylosis. Review of the MIP images confirms the above findings. IMPRESSION: 1. Limited scan.  No evidence of pulmonary embolism. 2. Cardiomegaly. 3. Dilated main pulmonary artery, suggesting  pulmonary arterial hypertension. 4. Minimal scattered interlobular septal thickening in the lungs, suggesting minimal pulmonary edema. 5. Two-vessel coronary atherosclerosis. Aortic Atherosclerosis (ICD10-I70.0). Electronically Signed   By: Delbert Phenix M.D.   On: 08/26/2017 01:10     CBC Recent Labs  Lab 08/24/17 1416 08/25/17 0407  WBC 6.6 7.0  HGB 12.5* 12.9*  HCT 36.5* 37.2*  PLT 323 327  MCV 82.0 81.4  MCH 28.1 28.2  MCHC 34.2 34.7  RDW 12.8 12.8  LYMPHSABS  --  0.6*  MONOABS  --  0.1  EOSABS  --  0.0  BASOSABS  --  0.0    Chemistries  Recent Labs  Lab 08/24/17 1416 08/25/17 0407  NA 141 137  K 4.1 4.0  CL 108 103  CO2 25 21*  GLUCOSE 160* 275*  BUN 8 14  CREATININE 1.05 1.07  CALCIUM 9.1 9.1   ------------------------------------------------------------------------------------------------------------------ estimated creatinine clearance is 93.2 mL/min (by C-G formula based on SCr of 1.07 mg/dL). ------------------------------------------------------------------------------------------------------------------ Recent Labs    08/25/17 1034  HGBA1C 7.8*   ------------------------------------------------------------------------------------------------------------------ No results for input(s): CHOL, HDL, LDLCALC, TRIG, CHOLHDL, LDLDIRECT in the last 72 hours. ------------------------------------------------------------------------------------------------------------------ No results for input(s): TSH, T4TOTAL, T3FREE, THYROIDAB in the last 72 hours.  Invalid input(s): FREET3 ------------------------------------------------------------------------------------------------------------------ No results for input(s): VITAMINB12, FOLATE, FERRITIN, TIBC, IRON, RETICCTPCT in the last 72 hours.  Coagulation profile No results for input(s): INR, PROTIME in the last 168 hours.  Recent Labs    08/25/17 1034  DDIMER 0.63*    Cardiac Enzymes No results for input(s):  CKMB, TROPONINI, MYOGLOBIN in the last 168 hours.  Invalid input(s): CK ------------------------------------------------------------------------------------------------------------------ Invalid input(s): POCBNP   CBG: Recent Labs  Lab 08/25/17 1208 08/25/17 1610 08/25/17 2004 08/25/17 2350 08/26/17 0355  GLUCAP 282* 241* 271* 323* 236*       Studies: Dg Chest 2 View  Result Date: 08/24/2017 CLINICAL DATA:  Cough and congestion EXAM: CHEST  2 VIEW COMPARISON:  None. FINDINGS: There is no edema or consolidation. Heart is mildly enlarged with pulmonary vascularity within normal limits. No adenopathy. No bone lesions. IMPRESSION: Mild cardiomegaly.  No edema or consolidation. Electronically Signed   By: Bretta Bang III M.D.   On: 08/24/2017 15:27   Ct Angio Chest Pe W Or Wo Contrast  Result Date: 08/26/2017 CLINICAL DATA:  Dyspnea for 3-4 weeks.  Elevated D-dimer. EXAM: CT ANGIOGRAPHY CHEST WITH CONTRAST TECHNIQUE: Multidetector CT imaging of the chest was performed using the standard protocol during bolus administration of intravenous contrast. Multiplanar CT image reconstructions and MIPs were obtained to evaluate the vascular anatomy. CONTRAST:  58 cc ISOVUE-370 IOPAMIDOL (ISOVUE-370) INJECTION 76% COMPARISON:  08/24/2017 chest radiograph. FINDINGS: Cardiovascular: The study is low to moderate quality for the evaluation of pulmonary embolism, limited by slightly suboptimal contrast opacification and by motion artifact. There are no convincing filling defects in the central, lobar, segmental or subsegmental pulmonary artery branches to suggest acute pulmonary embolism. Mildly atherosclerotic nonaneurysmal thoracic aorta. Dilated main pulmonary artery (3.6 cm diameter). Mild cardiomegaly. Left anterior descending and right coronary atherosclerosis. No significant  pericardial effusion/thickening. Mediastinum/Nodes: No discrete thyroid nodules. Unremarkable esophagus. No pathologically  enlarged axillary, mediastinal or hilar lymph nodes. Lungs/Pleura: No pneumothorax. No pleural effusion. Subsegmental bilateral lower lobe atelectasis. No acute consolidative airspace disease, lung masses or significant pulmonary nodules. Minimal scattered interlobular septal thickening in both lungs. Upper abdomen: No acute abnormality. Musculoskeletal: No aggressive appearing focal osseous lesions. Minimal thoracic spondylosis. Review of the MIP images confirms the above findings. IMPRESSION: 1. Limited scan.  No evidence of pulmonary embolism. 2. Cardiomegaly. 3. Dilated main pulmonary artery, suggesting pulmonary arterial hypertension. 4. Minimal scattered interlobular septal thickening in the lungs, suggesting minimal pulmonary edema. 5. Two-vessel coronary atherosclerosis. Aortic Atherosclerosis (ICD10-I70.0). Electronically Signed   By: Delbert Phenix M.D.   On: 08/26/2017 01:10      Lab Results  Component Value Date   HGBA1C 7.8 (H) 08/25/2017   HGBA1C 7.1 (H) 03/15/2014   Lab Results  Component Value Date   CREATININE 1.07 08/25/2017       Scheduled Meds: . atorvastatin  10 mg Oral Daily  . azithromycin  500 mg Oral Daily  . carvedilol  10 mg Oral Daily  . enoxaparin (LOVENOX) injection  40 mg Subcutaneous Q24H  . furosemide  20 mg Intravenous Once  . furosemide  40 mg Intravenous Q12H  . Influenza vac split quadrivalent PF  0.5 mL Intramuscular Tomorrow-1000  . insulin aspart  0-9 Units Subcutaneous Q4H  . insulin glargine  20 Units Subcutaneous Daily  . lisinopril  10 mg Oral Daily  . methylPREDNISolone (SOLU-MEDROL) injection  40 mg Intravenous Q8H  . pneumococcal 23 valent vaccine  0.5 mL Intramuscular Tomorrow-1000  . sodium chloride flush  3 mL Intravenous Q12H   Continuous Infusions: . sodium chloride       LOS: 2 days    Time spent: >30 MINS    Darlin Drop  [facility] Pager [phone]. If 7PM-7AM, please contact night-coverage at [email or web address],  password [facility] 08/26/2017, 7:30 AM  LOS: 2 days

## 2017-08-27 ENCOUNTER — Inpatient Hospital Stay (HOSPITAL_COMMUNITY): Payer: Medicaid Other

## 2017-08-27 DIAGNOSIS — I340 Nonrheumatic mitral (valve) insufficiency: Secondary | ICD-10-CM

## 2017-08-27 LAB — ECHOCARDIOGRAM COMPLETE
CHL CUP DOP CALC LVOT VTI: 21.7 cm
CHL CUP MV DEC (S): 190
E decel time: 190 msec
E/e' ratio: 18.11
FS: 23 % — AB (ref 28–44)
HEIGHTINCHES: 69 in
IV/PV OW: 1
LA ID, A-P, ES: 46 mm
LA diam index: 2.18 cm/m2
LA vol index: 49.8 mL/m2
LA vol: 105 mL
LAVOLA4C: 89.3 mL
LDCA: 3.14 cm2
LEFT ATRIUM END SYS DIAM: 46 mm
LV E/e' medial: 18.11
LV E/e'average: 18.11
LV PW d: 12 mm — AB (ref 0.6–1.1)
LV TDI E'LATERAL: 5.44
LV TDI E'MEDIAL: 5.22
LVELAT: 5.44 cm/s
LVOT peak grad rest: 5 mmHg
LVOTD: 20 mm
LVOTPV: 114 cm/s
LVOTSV: 68 mL
MRPISAEROA: 0.09 cm2
MV pk A vel: 127 m/s
MV pk E vel: 98.5 m/s
MVPG: 4 mmHg
RV TAPSE: 23.4 mm
VTI: 181 cm
Weight: 3372.8 oz

## 2017-08-27 LAB — COMPREHENSIVE METABOLIC PANEL
ALBUMIN: 3.9 g/dL (ref 3.5–5.0)
ALT: 29 U/L (ref 17–63)
AST: 18 U/L (ref 15–41)
Alkaline Phosphatase: 75 U/L (ref 38–126)
Anion gap: 14 (ref 5–15)
BILIRUBIN TOTAL: 0.7 mg/dL (ref 0.3–1.2)
BUN: 31 mg/dL — AB (ref 6–20)
CO2: 19 mmol/L — AB (ref 22–32)
Calcium: 9 mg/dL (ref 8.9–10.3)
Chloride: 100 mmol/L — ABNORMAL LOW (ref 101–111)
Creatinine, Ser: 1.19 mg/dL (ref 0.61–1.24)
GFR calc Af Amer: >60 mL/min (ref >60)
GFR calc non Af Amer: >60 mL/min (ref >60)
GLUCOSE: 344 mg/dL — AB (ref 65–99)
Potassium: 4.1 mmol/L (ref 3.5–5.1)
SODIUM: 133 mmol/L — AB (ref 135–145)
TOTAL PROTEIN: 7.5 g/dL (ref 6.5–8.1)

## 2017-08-27 LAB — GLUCOSE, CAPILLARY
GLUCOSE-CAPILLARY: 195 mg/dL — AB (ref 65–99)
GLUCOSE-CAPILLARY: 305 mg/dL — AB (ref 65–99)
Glucose-Capillary: 304 mg/dL — ABNORMAL HIGH (ref 65–99)
Glucose-Capillary: 235 mg/dL — ABNORMAL HIGH (ref 65–99)
Glucose-Capillary: 330 mg/dL — ABNORMAL HIGH (ref 65–99)

## 2017-08-27 LAB — MAGNESIUM: Magnesium: 2.5 mg/dL — ABNORMAL HIGH (ref 1.7–2.4)

## 2017-08-27 MED ORDER — INSULIN ASPART 100 UNIT/ML ~~LOC~~ SOLN
4.0000 [IU] | Freq: Three times a day (TID) | SUBCUTANEOUS | Status: DC
  Administered 2017-08-27 – 2017-08-30 (×8): 4 [IU] via SUBCUTANEOUS

## 2017-08-27 MED ORDER — IPRATROPIUM-ALBUTEROL 0.5-2.5 (3) MG/3ML IN SOLN
3.0000 mL | Freq: Four times a day (QID) | RESPIRATORY_TRACT | Status: DC
  Administered 2017-08-28: 3 mL via RESPIRATORY_TRACT
  Filled 2017-08-27: qty 3

## 2017-08-27 MED ORDER — METHYLPREDNISOLONE SODIUM SUCC 40 MG IJ SOLR
40.0000 mg | Freq: Every day | INTRAMUSCULAR | Status: DC
Start: 2017-08-28 — End: 2017-08-28
  Administered 2017-08-28: 40 mg via INTRAVENOUS
  Filled 2017-08-27: qty 1

## 2017-08-27 MED ORDER — INSULIN GLARGINE 100 UNIT/ML ~~LOC~~ SOLN
24.0000 [IU] | Freq: Every day | SUBCUTANEOUS | Status: DC
  Administered 2017-08-28 – 2017-08-30 (×3): 24 [IU] via SUBCUTANEOUS
  Filled 2017-08-27 (×3): qty 0.24

## 2017-08-27 NOTE — Progress Notes (Signed)
Pt states he needs assistance with discharge  Informed social work and case Comptrollermanager  Brenda CM at bedside

## 2017-08-27 NOTE — Progress Notes (Signed)
  Echocardiogram 2D Echocardiogram has been performed.  Kenneth Rios, [name] 08/27/2017, 10:06 AM

## 2017-08-27 NOTE — Progress Notes (Signed)
PROGRESS NOTE    Kenneth Rios  WGN:[id number] DOB: 03-19-1966 DOA: 08/24/2017 PCP: Patient, No Pcp Per   Brief Narrative:   52 year old male with history of chronic CHF, hypertension, diabetes type 2 came to the hospital with complaints of dyspnea.  He was found to be hypertensive emergency, COPD exacerbation and concerns of fluid overload.  His rhinovirus came back positive.  He was started on breathing treatment along with diuresis.  D-dimer was noted to be elevated therefore CT of the chest was done which was negative for pulmonary embolism.  Assessment & Plan:   Principal Problem:   CHF, acute on chronic (HCC) Active Problems:   Diabetes mellitus without complication (HCC)   Hypertension   Bronchospasm   Hypertensive urgency  Acute respiratory distress, improving -This is multifactorial, fluid overload versus COPD exacerbation with acute viral bronchitis, rhinovirus positive Elevated d-dimer, CTA of the chest is negative for pulmonary embolism  Acute viral bronchitis, rhinovirus Mild acute exacerbation of mild intermittent COPD -Continue supportive measures, nebulizer treatments, antitussives -We will decrease IV steroids to daily  Acute CHF exacerbation, unspecified systolic versus diastolic Fluid overload -Patient has diuresed well with Lasix 40 mg IV 6 L since he has been in the hospital -Echocardiogram has been done this morning but results are pending -Labs are also pending from this morning once they are resulted in depending on echocardiogram results shows I may increase his diuretics from twice daily to 3 times daily. -Continue provide supportive care, continue Coreg 10 mg daily  Essential hypertension -Was elevated when he came to the hospital was in hypertensive emergency -This is improved, continue Coreg and lisinopril  Diabetes mellitus type 2 - Uncontrolled here likely because he is on Solu-Medrol -Metformin is on hold, patient received IV contrast  yesterday therefore we will continue to hold this -We will increase Lantus from 20 units daily to 24 units and add 4 units of aspart pre-meals.   DVT prophylaxis: Patient is ambulating well Code Status: Full code Family Communication: Significant other at bedside Disposition Plan: Likely discharge in next 24-48 hours  Consultants:   None   Subjective: Patient states he still has some exertional shortness of breath but it has been improved quite a bit since he has been in the hospital.  No acute events overnight.  Objective: Vitals:   08/26/17 2103 08/27/17 0209 08/27/17 0439 08/27/17 0813  BP: (!) 164/100  (!) 157/90   Pulse: 82  84 81  Resp: 18  20 16   Temp: 98.6 F (37 C)  97.9 F (36.6 C)   TempSrc: Oral  Oral   SpO2: 97% 96% 97% 94%  Weight:   95.6 kg (210 lb 12.8 oz)   Height:        Intake/Output Summary (Last 24 hours) at 08/27/2017 0906 Last data filed at 08/27/2017 0439 Gross per 24 hour  Intake 1560 ml  Output 2450 ml  Net -890 ml   Filed Weights   08/25/17 1452 08/26/17 0514 08/27/17 0439  Weight: 96.1 kg (211 lb 12.8 oz) 95.8 kg (211 lb 3.2 oz) 95.6 kg (210 lb 12.8 oz)    Examination:  General exam: Appears calm and comfortable  Respiratory system: Bibasilar crackles midway up his lungs, no wheezing noted Cardiovascular system: S1 & S2 heard, RRR. No JVD, murmurs, rubs, gallops or clicks. No pedal edema. Gastrointestinal system: Abdomen is nondistended, soft and nontender. No organomegaly or masses felt. Normal bowel sounds heard. Central nervous system: Alert and oriented. No focal neurological  deficits. Extremities: Symmetric 5 x 5 power. Skin: No rashes, lesions or ulcers Psychiatry: Judgement and insight appear normal. Mood & affect appropriate.     Data Reviewed:   CBC: Recent Labs  Lab 08/24/17 1416 08/25/17 0407  WBC 6.6 7.0  NEUTROABS  --  6.3  HGB 12.5* 12.9*  HCT 36.5* 37.2*  MCV 82.0 81.4  PLT 323 327   Basic Metabolic  Panel: Recent Labs  Lab 08/24/17 1416 08/25/17 0407 08/26/17 0613  NA 141 137 133*  K 4.1 4.0 4.2  CL 108 103 101  CO2 25 21* 21*  GLUCOSE 160* 275* 261*  BUN 8 14 25*  CREATININE 1.05 1.07 1.10  CALCIUM 9.1 9.1 9.3   GFR: Estimated Creatinine Clearance: 90.7 mL/min (by C-G formula based on SCr of 1.1 mg/dL). Liver Function Tests: Recent Labs  Lab 08/26/17 0613  AST 18  ALT 34  ALKPHOS 73  BILITOT 0.7  PROT 7.5  ALBUMIN 4.0   No results for input(s): LIPASE, AMYLASE in the last 168 hours. No results for input(s): AMMONIA in the last 168 hours. Coagulation Profile: No results for input(s): INR, PROTIME in the last 168 hours. Cardiac Enzymes: No results for input(s): CKTOTAL, CKMB, CKMBINDEX, TROPONINI in the last 168 hours. BNP (last 3 results) No results for input(s): PROBNP in the last 8760 hours. HbA1C: Recent Labs    08/25/17 1034  HGBA1C 7.8*   CBG: Recent Labs  Lab 08/26/17 1324 08/26/17 1706 08/27/17 0014 08/27/17 0436 08/27/17 0740  GLUCAP 454* 273* 305* 304* 235*   Lipid Profile: No results for input(s): CHOL, HDL, LDLCALC, TRIG, CHOLHDL, LDLDIRECT in the last 72 hours. Thyroid Function Tests: No results for input(s): TSH, T4TOTAL, FREET4, T3FREE, THYROIDAB in the last 72 hours. Anemia Panel: No results for input(s): VITAMINB12, FOLATE, FERRITIN, TIBC, IRON, RETICCTPCT in the last 72 hours. Sepsis Labs: No results for input(s): PROCALCITON, LATICACIDVEN in the last 168 hours.  Recent Results (from the past 240 hour(s))  Respiratory Panel by PCR     Status: Abnormal   Collection Time: 08/25/17  5:00 AM  Result Value Ref Range Status   Adenovirus NOT DETECTED NOT DETECTED Final   Coronavirus 229E NOT DETECTED NOT DETECTED Final   Coronavirus HKU1 NOT DETECTED NOT DETECTED Final   Coronavirus NL63 NOT DETECTED NOT DETECTED Final   Coronavirus OC43 NOT DETECTED NOT DETECTED Final   Metapneumovirus NOT DETECTED NOT DETECTED Final   Rhinovirus  / Enterovirus DETECTED (A) NOT DETECTED Final   Influenza A NOT DETECTED NOT DETECTED Final   Influenza B NOT DETECTED NOT DETECTED Final   Parainfluenza Virus 1 NOT DETECTED NOT DETECTED Final   Parainfluenza Virus 2 NOT DETECTED NOT DETECTED Final   Parainfluenza Virus 3 NOT DETECTED NOT DETECTED Final   Parainfluenza Virus 4 NOT DETECTED NOT DETECTED Final   Respiratory Syncytial Virus NOT DETECTED NOT DETECTED Final   Bordetella pertussis NOT DETECTED NOT DETECTED Final   Chlamydophila pneumoniae NOT DETECTED NOT DETECTED Final   Mycoplasma pneumoniae NOT DETECTED NOT DETECTED Final         Radiology Studies: Ct Angio Chest Pe W Or Wo Contrast  Result Date: 08/26/2017 CLINICAL DATA:  Dyspnea for 3-4 weeks.  Elevated D-dimer. EXAM: CT ANGIOGRAPHY CHEST WITH CONTRAST TECHNIQUE: Multidetector CT imaging of the chest was performed using the standard protocol during bolus administration of intravenous contrast. Multiplanar CT image reconstructions and MIPs were obtained to evaluate the vascular anatomy. CONTRAST:  58 cc ISOVUE-370 IOPAMIDOL (ISOVUE-370)  INJECTION 76% COMPARISON:  08/24/2017 chest radiograph. FINDINGS: Cardiovascular: The study is low to moderate quality for the evaluation of pulmonary embolism, limited by slightly suboptimal contrast opacification and by motion artifact. There are no convincing filling defects in the central, lobar, segmental or subsegmental pulmonary artery branches to suggest acute pulmonary embolism. Mildly atherosclerotic nonaneurysmal thoracic aorta. Dilated main pulmonary artery (3.6 cm diameter). Mild cardiomegaly. Left anterior descending and right coronary atherosclerosis. No significant pericardial effusion/thickening. Mediastinum/Nodes: No discrete thyroid nodules. Unremarkable esophagus. No pathologically enlarged axillary, mediastinal or hilar lymph nodes. Lungs/Pleura: No pneumothorax. No pleural effusion. Subsegmental bilateral lower lobe  atelectasis. No acute consolidative airspace disease, lung masses or significant pulmonary nodules. Minimal scattered interlobular septal thickening in both lungs. Upper abdomen: No acute abnormality. Musculoskeletal: No aggressive appearing focal osseous lesions. Minimal thoracic spondylosis. Review of the MIP images confirms the above findings. IMPRESSION: 1. Limited scan.  No evidence of pulmonary embolism. 2. Cardiomegaly. 3. Dilated main pulmonary artery, suggesting pulmonary arterial hypertension. 4. Minimal scattered interlobular septal thickening in the lungs, suggesting minimal pulmonary edema. 5. Two-vessel coronary atherosclerosis. Aortic Atherosclerosis (ICD10-I70.0). Electronically Signed   By: Delbert Phenix M.D.   On: 08/26/2017 01:10        Scheduled Meds: . atorvastatin  10 mg Oral Daily  . azithromycin  500 mg Oral Daily  . carvedilol  10 mg Oral Daily  . enoxaparin (LOVENOX) injection  40 mg Subcutaneous Q24H  . furosemide  20 mg Intravenous Once  . furosemide  40 mg Intravenous Q12H  . insulin aspart  0-9 Units Subcutaneous Q4H  . insulin glargine  20 Units Subcutaneous Daily  . ipratropium-albuterol  3 mL Nebulization Q6H  . lisinopril  10 mg Oral Daily  . methylPREDNISolone (SOLU-MEDROL) injection  40 mg Intravenous Q8H  . sodium chloride flush  3 mL Intravenous Q12H  . sodium chloride HYPERTONIC  4 mL Nebulization Daily   Continuous Infusions: . sodium chloride       LOS: 3 days    Time spent: 30 mins     Ankit Joline Maxcy, MD [facility] Pager [phone]   If 7PM-7AM, please contact night-coverage [email or web address] Password [email or web address] 08/27/2017, 9:06 AM

## 2017-08-28 ENCOUNTER — Encounter (HOSPITAL_COMMUNITY): Payer: Self-pay

## 2017-08-28 ENCOUNTER — Inpatient Hospital Stay (HOSPITAL_COMMUNITY): Payer: Medicaid Other

## 2017-08-28 DIAGNOSIS — R1032 Left lower quadrant pain: Secondary | ICD-10-CM

## 2017-08-28 LAB — COMPREHENSIVE METABOLIC PANEL
ALBUMIN: 3.9 g/dL (ref 3.5–5.0)
ALT: 24 U/L (ref 17–63)
ANION GAP: 13 (ref 5–15)
AST: 17 U/L (ref 15–41)
Alkaline Phosphatase: 75 U/L (ref 38–126)
BILIRUBIN TOTAL: 0.9 mg/dL (ref 0.3–1.2)
BUN: 38 mg/dL — AB (ref 6–20)
CO2: 22 mmol/L (ref 22–32)
Calcium: 9 mg/dL (ref 8.9–10.3)
Chloride: 102 mmol/L (ref 101–111)
Creatinine, Ser: 1.35 mg/dL — ABNORMAL HIGH (ref 0.61–1.24)
GFR calc Af Amer: >60 mL/min (ref >60)
GFR calc non Af Amer: 59 mL/min — ABNORMAL LOW (ref >60)
GLUCOSE: 194 mg/dL — AB (ref 65–99)
POTASSIUM: 4.1 mmol/L (ref 3.5–5.1)
SODIUM: 137 mmol/L (ref 135–145)
TOTAL PROTEIN: 7.6 g/dL (ref 6.5–8.1)

## 2017-08-28 LAB — GLUCOSE, CAPILLARY
GLUCOSE-CAPILLARY: 179 mg/dL — AB (ref 65–99)
GLUCOSE-CAPILLARY: 186 mg/dL — AB (ref 65–99)
GLUCOSE-CAPILLARY: 202 mg/dL — AB (ref 65–99)
GLUCOSE-CAPILLARY: 82 mg/dL (ref 65–99)
Glucose-Capillary: 317 mg/dL — ABNORMAL HIGH (ref 65–99)
Glucose-Capillary: 394 mg/dL — ABNORMAL HIGH (ref 65–99)
Glucose-Capillary: 164 mg/dL — ABNORMAL HIGH (ref 65–99)
Glucose-Capillary: 199 mg/dL — ABNORMAL HIGH (ref 65–99)
Glucose-Capillary: 157 mg/dL — ABNORMAL HIGH (ref 65–99)

## 2017-08-28 LAB — MAGNESIUM: Magnesium: 2.3 mg/dL (ref 1.7–2.4)

## 2017-08-28 LAB — BRAIN NATRIURETIC PEPTIDE: B Natriuretic Peptide: 57.3 pg/mL (ref 0.0–100.0)

## 2017-08-28 MED ORDER — LEVOFLOXACIN IN D5W 750 MG/150ML IV SOLN
750.0000 mg | INTRAVENOUS | Status: DC
  Administered 2017-08-28 – 2017-08-29 (×2): 750 mg via INTRAVENOUS
  Filled 2017-08-28 (×2): qty 150

## 2017-08-28 MED ORDER — HYDROMORPHONE HCL 1 MG/ML IJ SOLN
2.0000 mg | INTRAMUSCULAR | Status: DC | PRN
  Administered 2017-08-28 – 2017-08-29 (×7): 2 mg via INTRAVENOUS
  Filled 2017-08-28 (×8): qty 2

## 2017-08-28 MED ORDER — PREDNISONE 20 MG PO TABS
40.0000 mg | ORAL_TABLET | Freq: Every day | ORAL | Status: DC
  Administered 2017-08-29 – 2017-08-30 (×2): 40 mg via ORAL
  Filled 2017-08-28 (×2): qty 2

## 2017-08-28 MED ORDER — IOPAMIDOL (ISOVUE-300) INJECTION 61%
15.0000 mL | INTRAVENOUS | Status: AC
  Administered 2017-08-28 (×2): 15 mL via ORAL

## 2017-08-28 MED ORDER — IPRATROPIUM-ALBUTEROL 0.5-2.5 (3) MG/3ML IN SOLN
3.0000 mL | Freq: Three times a day (TID) | RESPIRATORY_TRACT | Status: DC
  Administered 2017-08-28: 3 mL via RESPIRATORY_TRACT
  Filled 2017-08-28 (×3): qty 3

## 2017-08-28 MED ORDER — HYDROMORPHONE HCL 1 MG/ML IJ SOLN
1.0000 mg | Freq: Once | INTRAMUSCULAR | Status: AC
  Administered 2017-08-28: 1 mg via INTRAVENOUS
  Filled 2017-08-28: qty 1

## 2017-08-28 MED ORDER — FUROSEMIDE 10 MG/ML IJ SOLN: 40.0000 mg | Freq: Every day | INTRAMUSCULAR | Status: DC

## 2017-08-28 MED ORDER — METRONIDAZOLE IN NACL 5-0.79 MG/ML-% IV SOLN
500.0000 mg | Freq: Three times a day (TID) | INTRAVENOUS | Status: DC
  Administered 2017-08-28 – 2017-08-30 (×6): 500 mg via INTRAVENOUS
  Filled 2017-08-28 (×6): qty 100

## 2017-08-28 MED ORDER — IOPAMIDOL (ISOVUE-300) INJECTION 61%
INTRAVENOUS | Status: AC
  Filled 2017-08-28: qty 100

## 2017-08-28 MED ORDER — ASPIRIN 81 MG PO CHEW
81.0000 mg | CHEWABLE_TABLET | Freq: Every day | ORAL | Status: DC
Start: 2017-08-28 — End: 2017-08-30
  Administered 2017-08-28 – 2017-08-30 (×3): 81 mg via ORAL
  Filled 2017-08-28 (×3): qty 1

## 2017-08-28 MED ORDER — MORPHINE SULFATE (PF) 2 MG/ML IV SOLN: 2.0000 mg | INTRAVENOUS | Status: DC | PRN

## 2017-08-28 MED ORDER — IOPAMIDOL (ISOVUE-300) INJECTION 61%: 100.0000 mL | Freq: Once | INTRAVENOUS | Status: DC | PRN

## 2017-08-28 MED ORDER — MORPHINE SULFATE (PF) 2 MG/ML IV SOLN
2.0000 mg | Freq: Once | INTRAVENOUS | Status: AC
  Administered 2017-08-28: 2 mg via INTRAVENOUS
  Filled 2017-08-28: qty 1

## 2017-08-28 MED ORDER — LISINOPRIL 20 MG PO TABS
20.0000 mg | ORAL_TABLET | Freq: Every day | ORAL | Status: DC
  Administered 2017-08-29 – 2017-08-30 (×2): 20 mg via ORAL
  Filled 2017-08-28 (×2): qty 1

## 2017-08-28 NOTE — Progress Notes (Addendum)
PROGRESS NOTE    Kenneth Rios  ZOX:[id number] DOB: [birth year] DOA: 08/24/2017 PCP: Patient, No Pcp Per   Brief Narrative:   52 year old male with history of chronic CHF, hypertension, diabetes type 2 came to the hospital with complaints of dyspnea.  He was found to be hypertensive emergency, COPD exacerbation and concerns of fluid overload.  His rhinovirus came back positive.  He was started on breathing treatment along with diuresis.  D-dimer was noted to be elevated therefore CT of the chest was done which was negative for pulmonary embolism.   Assessment & Plan:   Principal Problem:   CHF, acute on chronic (HCC) Active Problems:   Diabetes mellitus without complication (HCC)   Hypertension   Bronchospasm   Hypertensive urgency  Acute respiratory distress, improving -This is multifactorial, fluid overload versus COPD exacerbation with acute viral bronchitis, rhinovirus positive Elevated d-dimer, CTA of the chest is negative for pulmonary embolism  Severe left lower quadrant abdominal pain -This pain started yesterday evening.  Patient states there is concerns for diverticulitis.  Also concerned this could be a kidney stone. -I will order CT abdomen pelvis.  Received IV pain medications for pain control.  We will follow-up on the CT results -Abdominal x-ray done last night is negative for any acute pathology  Acute viral bronchitis, rhinovirus Mild acute exacerbation of mild intermittent COPD -Continue supportive measures, nebulizer treatments, antitussives -Change Solumedrol to Prednisone for 3 more days   Acute combined diastolic and systolic congestive heart failure, class II-3, improving -Patient has had significant diuresis with IV Lasix since being her.  He has put out greater than 7 L since his admission.  He feels much better. -Echocardiogram done today shows ejection fraction 45-50%.  Looking at his previous records from 2015 it appears ejection fraction was  30-35%.  He was also on aspirin at that time therefore I will restart his aspirin today.  He needs to follow-up outpatient with cardiology. -Close to being euvolemic therefore change his Lasix from 40 mg IV twice daily to daily -Continue provide supportive care, continue Coreg 10 mg daily  Essential hypertension -Was elevated when he came to the hospital was in hypertensive emergency -This is improved, continue Coreg and lisinopril.  Still elevated overnight therefore I will increase his lisinopril to 20 mg  Diabetes mellitus type 2 -Blood sugars a little better controlled on increased dose of Lantus and sliding scale.  Continue closer monitoring.  His Solu-Medrol has been changed to prednisone -We will increase Lantus from 20 units daily to 24 units and add 4 units of aspart pre-meals.  Addendum 145pm: CT of the abdomen pelvis done which shows concerns for acute diverticulitis with micro rupture without any abscess formation.  At this time will start the patient on IV Levaquin and Flagyl. Keep him npo and adv diet as tolerated.    DVT prophylaxis: Patient is ambulating well Code Status: Full code Family Communication: Significant other at bedside Disposition Plan: To be determined  Consultants:   None   Subjective: Patient started experiencing severe acute abdominal pain in the left lower quadrant therefore received IV pain medications last night.  Abdominal x-ray that was done was negative.  When I saw him this morning he reported of the same pain and states it similar to his previous diverticulitis episode.  No fevers, chills, diarrhea, any blood in the urine.  Objective: Vitals:   08/27/17 2022 08/27/17 2102 08/28/17 0148 08/28/17 0414  BP: (!) 151/82  119/79 131/88  Pulse:  82  90 90  Resp: 18   18  Temp: 98.2 F (36.8 C)   97.6 F (36.4 C)  TempSrc: Oral   Oral  SpO2: 96% 96%  95%  Weight:    95.3 kg (210 lb 3.2 oz)  Height:        Intake/Output Summary (Last 24  hours) at 08/28/2017 1144 Last data filed at 08/28/2017 0011 Gross per 24 hour  Intake 360 ml  Output 2500 ml  Net -2140 ml   Filed Weights   08/26/17 0514 08/27/17 0439 08/28/17 0414  Weight: 95.8 kg (211 lb 3.2 oz) 95.6 kg (210 lb 12.8 oz) 95.3 kg (210 lb 3.2 oz)    Examination:  General exam: Appears calm and comfortable  Respiratory system: Bibasilar crackles midway up his lungs, no wheezing noted Cardiovascular system: S1 & S2 heard, RRR. No JVD, murmurs, rubs, gallops or clicks. No pedal edema. Gastrointestinal system: Abdomen is slightly distended, left lower quadrant tender to touch.. No organomegaly or masses felt. Normal bowel sounds heard. Central nervous system: Alert and oriented. No focal neurological deficits. Extremities: Symmetric 5 x 5 power. Skin: No rashes, lesions or ulcers Psychiatry: Judgement and insight appear normal. Mood & affect appropriate.     Data Reviewed:   CBC: Recent Labs  Lab 08/24/17 1416 08/25/17 0407  WBC 6.6 7.0  NEUTROABS  --  6.3  HGB 12.5* 12.9*  HCT 36.5* 37.2*  MCV 82.0 81.4  PLT 323 327   Basic Metabolic Panel: Recent Labs  Lab 08/24/17 1416 08/25/17 0407 08/26/17 0613 08/27/17 0854 08/28/17 0818  NA 141 137 133* 133* 137  K 4.1 4.0 4.2 4.1 4.1  CL 108 103 101 100* 102  CO2 25 21* 21* 19* 22  GLUCOSE 160* 275* 261* 344* 194*  BUN 8 14 25* 31* 38*  CREATININE 1.05 1.07 1.10 1.19 1.35*  CALCIUM 9.1 9.1 9.3 9.0 9.0  MG  --   --   --  2.5* 2.3   GFR: Estimated Creatinine Clearance: 73.7 mL/min (A) (by C-G formula based on SCr of 1.35 mg/dL (H)). Liver Function Tests: Recent Labs  Lab 08/26/17 0613 08/27/17 0854 08/28/17 0818  AST 18 18 17   ALT 34 29 24  ALKPHOS 73 75 75  BILITOT 0.7 0.7 0.9  PROT 7.5 7.5 7.6  ALBUMIN 4.0 3.9 3.9   No results for input(s): LIPASE, AMYLASE in the last 168 hours. No results for input(s): AMMONIA in the last 168 hours. Coagulation Profile: No results for input(s): INR,  PROTIME in the last 168 hours. Cardiac Enzymes: No results for input(s): CKTOTAL, CKMB, CKMBINDEX, TROPONINI in the last 168 hours. BNP (last 3 results) No results for input(s): PROBNP in the last 8760 hours. HbA1C: No results for input(s): HGBA1C in the last 72 hours. CBG: Recent Labs  Lab 08/27/17 1633 08/27/17 2107 08/28/17 0010 08/28/17 0411 08/28/17 0751  GLUCAP 330* 195* 186* 164* 199*   Lipid Profile: No results for input(s): CHOL, HDL, LDLCALC, TRIG, CHOLHDL, LDLDIRECT in the last 72 hours. Thyroid Function Tests: No results for input(s): TSH, T4TOTAL, FREET4, T3FREE, THYROIDAB in the last 72 hours. Anemia Panel: No results for input(s): VITAMINB12, FOLATE, FERRITIN, TIBC, IRON, RETICCTPCT in the last 72 hours. Sepsis Labs: No results for input(s): PROCALCITON, LATICACIDVEN in the last 168 hours.  Recent Results (from the past 240 hour(s))  Respiratory Panel by PCR     Status: Abnormal   Collection Time: 08/25/17  5:00 AM  Result Value Ref Range  Status   Adenovirus NOT DETECTED NOT DETECTED Final   Coronavirus 229E NOT DETECTED NOT DETECTED Final   Coronavirus HKU1 NOT DETECTED NOT DETECTED Final   Coronavirus NL63 NOT DETECTED NOT DETECTED Final   Coronavirus OC43 NOT DETECTED NOT DETECTED Final   Metapneumovirus NOT DETECTED NOT DETECTED Final   Rhinovirus / Enterovirus DETECTED (A) NOT DETECTED Final   Influenza A NOT DETECTED NOT DETECTED Final   Influenza B NOT DETECTED NOT DETECTED Final   Parainfluenza Virus 1 NOT DETECTED NOT DETECTED Final   Parainfluenza Virus 2 NOT DETECTED NOT DETECTED Final   Parainfluenza Virus 3 NOT DETECTED NOT DETECTED Final   Parainfluenza Virus 4 NOT DETECTED NOT DETECTED Final   Respiratory Syncytial Virus NOT DETECTED NOT DETECTED Final   Bordetella pertussis NOT DETECTED NOT DETECTED Final   Chlamydophila pneumoniae NOT DETECTED NOT DETECTED Final   Mycoplasma pneumoniae NOT DETECTED NOT DETECTED Final          Radiology Studies: Dg Abd 1 View  Result Date: 08/28/2017 CLINICAL DATA:  Left lower quadrant abdominal pain EXAM: ABDOMEN - 1 VIEW COMPARISON:  CT abdomen pelvis 12/11/2013 FINDINGS: The bowel gas pattern is normal. No radio-opaque calculi or other significant radiographic abnormality are seen. IMPRESSION: Negative. Electronically Signed   By: Deatra RobinsonKevin  Herman M.D.   On: 08/28/2017 01:41        Scheduled Meds: . atorvastatin  10 mg Oral Daily  . azithromycin  500 mg Oral Daily  . carvedilol  10 mg Oral Daily  . enoxaparin (LOVENOX) injection  40 mg Subcutaneous Q24H  . furosemide  20 mg Intravenous Once  . furosemide  40 mg Intravenous Q12H  . insulin aspart  0-9 Units Subcutaneous Q4H  . insulin aspart  4 Units Subcutaneous TID AC  . insulin glargine  24 Units Subcutaneous Daily  . iopamidol      . ipratropium-albuterol  3 mL Nebulization QID  . lisinopril  10 mg Oral Daily  . methylPREDNISolone (SOLU-MEDROL) injection  40 mg Intravenous Daily  . sodium chloride flush  3 mL Intravenous Q12H  . sodium chloride HYPERTONIC  4 mL Nebulization Daily   Continuous Infusions: . sodium chloride       LOS: 4 days    Time spent: 30 mins     Ankit Joline Maxcyhirag Amin, MD [facility] Pager [phone]   If 7PM-7AM, please contact night-coverage [email or web address] Password [email or web address] 08/28/2017, 11:44 AM

## 2017-08-28 NOTE — Progress Notes (Signed)
Pt was having pain throughout the day, covered with IV dilaudid, New IVABX started and given after having CT abdomen done, pt is in NPO, will advance diet slowly, will continue to monitor the patient  Lonia FarberRekha, RN

## 2017-08-29 ENCOUNTER — Other Ambulatory Visit (HOSPITAL_COMMUNITY): Payer: Self-pay

## 2017-08-29 DIAGNOSIS — K5792 Diverticulitis of intestine, part unspecified, without perforation or abscess without bleeding: Secondary | ICD-10-CM | POA: Diagnosis not present

## 2017-08-29 HISTORY — DX: Diverticulitis of intestine, part unspecified, without perforation or abscess without bleeding: K57.92

## 2017-08-29 LAB — COMPREHENSIVE METABOLIC PANEL
ALBUMIN: 3.7 g/dL (ref 3.5–5.0)
ALK PHOS: 66 U/L (ref 38–126)
ALT: 19 U/L (ref 17–63)
ANION GAP: 14 (ref 5–15)
AST: 16 U/L (ref 15–41)
BILIRUBIN TOTAL: 1.4 mg/dL — AB (ref 0.3–1.2)
BUN: 38 mg/dL — ABNORMAL HIGH (ref 6–20)
CO2: 24 mmol/L (ref 22–32)
CREATININE: 1.43 mg/dL — AB (ref 0.61–1.24)
Calcium: 8.8 mg/dL — ABNORMAL LOW (ref 8.9–10.3)
Chloride: 98 mmol/L — ABNORMAL LOW (ref 101–111)
GFR calc Af Amer: >60 mL/min (ref >60)
GFR calc non Af Amer: 55 mL/min — ABNORMAL LOW (ref >60)
GLUCOSE: 106 mg/dL — AB (ref 65–99)
Potassium: 3.8 mmol/L (ref 3.5–5.1)
SODIUM: 136 mmol/L (ref 135–145)
TOTAL PROTEIN: 7.5 g/dL (ref 6.5–8.1)

## 2017-08-29 LAB — GLUCOSE, CAPILLARY
GLUCOSE-CAPILLARY: 114 mg/dL — AB (ref 65–99)
GLUCOSE-CAPILLARY: 153 mg/dL — AB (ref 65–99)
GLUCOSE-CAPILLARY: 210 mg/dL — AB (ref 65–99)
Glucose-Capillary: 128 mg/dL — ABNORMAL HIGH (ref 65–99)
Glucose-Capillary: 143 mg/dL — ABNORMAL HIGH (ref 65–99)
Glucose-Capillary: 95 mg/dL (ref 65–99)

## 2017-08-29 LAB — MAGNESIUM: Magnesium: 2.4 mg/dL (ref 1.7–2.4)

## 2017-08-29 MED ORDER — MAGNESIUM HYDROXIDE 400 MG/5ML PO SUSP
5.0000 mL | Freq: Every day | ORAL | Status: DC | PRN
  Administered 2017-08-29: 5 mL via ORAL
  Filled 2017-08-29: qty 30

## 2017-08-29 MED ORDER — MORPHINE SULFATE (PF) 2 MG/ML IV SOLN: 2.0000 mg | INTRAVENOUS | Status: DC | PRN

## 2017-08-29 MED ORDER — FUROSEMIDE 10 MG/ML IJ SOLN
20.0000 mg | Freq: Every day | INTRAMUSCULAR | Status: DC
  Administered 2017-08-29 – 2017-08-30 (×2): 20 mg via INTRAVENOUS
  Filled 2017-08-29 (×2): qty 2

## 2017-08-29 NOTE — Consult Note (Signed)
Reason for Consult: Diverticulitis Referring Physician: Dr. Vance Gather PCP: Patient, No Pcp Per Chief complaint:  shortness of breath  Kenneth Rios is an 52 y.o. male.  HPI: Patient is a 52 year old male who presents with shortness of breath to the ED.  He has a history of congestive heart failure and has been out of medications for several months.  He has had progressive worsening of his shortness of breath and PND.  He was admitted by the Medicine service on with respiratory distress, acute on chronic congestive heart failure, possible bronchospasm.  Additional issues include hypertension with a hypertensive urgency blood pressure 180/110 range.  Type 2 diabetes with an hemoglobin A1c of 7.1 he was placed on IV diuretics fluid restriction and daily weights along with a beta-blocker and lisinopril.  He showed no improvement 2D echo was obtained on 08/27/17.  This showed an EF of 45-50% improved from 30-35% back in 2015.  Workup for shortness of breath included a CT of the chest.  He developed abdominal pain yesterday afternoon and got a single view film which was negative.  This was followed by a CT scan.  CT scan reveals normal stomach and appendix no evidence of small bowel wall thickening distention or inflammatory changes.  There is a relatively asymmetric mucosal thickening of the sigmoid colon with numerous diverticuli.  There is no associated pericolonic inflammatory changes and a small amount of free fluid possibly a focus of extraluminal gas medial to this abnormal bowel.  A microperforation cannot be excluded.  There is no evidence of frank abscess there are scattered diverticuli throughout the left colon.  He reports prior colonoscopies but none are in the epic.  We are asked to see for possible diverticulitis.  He has had this in the past there is at least one admission at [facility] for this in 2015.  Past Medical History:  Diagnosis Date  . CHF (congestive heart failure)  ([place])   . Diabetes mellitus without complication ([place])   . Diverticulitis   . Hypertension     History reviewed. No pertinent surgical history.  Family History  Problem Relation Age of Onset  . Sudden Cardiac Death Neg Hx     Social History:  reports that he has quit smoking. he has never used smokeless tobacco. He reports that he drinks alcohol. He reports that he does not use drugs.  Allergies: No Known Allergies  Medications: He has been out of home meds for a couple months prior to admit.   Prior to Admission:  Medications Prior to Admission  Medication Sig Dispense Refill Last Dose  . atorvastatin (LIPITOR) 10 MG tablet Take 10 mg by mouth daily.   unk  . carvedilol (COREG CR) 10 MG 24 hr capsule Take 10 mg by mouth daily.   unk  . DiphenhydrAMINE HCl (THERAFLU MULTI SYMPTOM PO) Take 30 mLs by mouth 2 (two) times daily.   08/24/2017 at Unknown time  . furosemide (LASIX) 20 MG tablet Take 20 mg by mouth daily.   unk  . lisinopril (PRINIVIL,ZESTRIL) 10 MG tablet Take 10 mg by mouth daily.   unk  . metFORMIN (GLUCOPHAGE) 500 MG tablet Take 500 mg by mouth 2 (two) times daily with a meal.   unk  . sodium-potassium bicarbonate (ALKA-SELTZER GOLD) TBEF dissolvable tablet Take 1 tablet by mouth daily as needed (cold/cough).   08/24/2017 at Unknown time   Continuous: . sodium chloride    . levofloxacin (LEVAQUIN) IV 750 mg (08/29/17  1509)  . metronidazole Stopped (08/29/17 1430)   Anti-infectives (From admission, onward)   Start     Dose/Rate Route Frequency Ordered Stop   08/28/17 1400  levofloxacin (LEVAQUIN) IVPB 750 mg     750 mg 100 mL/hr over 90 Minutes Intravenous Every 24 hours 08/28/17 1347     08/28/17 1400  metroNIDAZOLE (FLAGYL) IVPB 500 mg     500 mg 100 mL/hr over 60 Minutes Intravenous Every 8 hours 08/28/17 1347     08/25/17 1230  azithromycin (ZITHROMAX) tablet 500 mg  Status:  Discontinued     500 mg Oral Daily 08/25/17 1227 08/28/17 1358      Results for  orders placed or performed during the hospital encounter of 08/24/17 (from the past 48 hour(s))  Glucose, capillary     Status: Abnormal   Collection Time: 08/27/17  4:33 PM  Result Value Ref Range   Glucose-Capillary 330 (H) 65 - 99 mg/dL   Comment 1 Notify RN    Comment 2 Document in Chart   Glucose, capillary     Status: Abnormal   Collection Time: 08/27/17  9:07 PM  Result Value Ref Range   Glucose-Capillary 195 (H) 65 - 99 mg/dL   Comment 1 Notify RN    Comment 2 Document in Chart   Glucose, capillary     Status: Abnormal   Collection Time: 08/28/17 12:10 AM  Result Value Ref Range   Glucose-Capillary 186 (H) 65 - 99 mg/dL   Comment 1 Notify RN    Comment 2 Document in Chart   Glucose, capillary     Status: Abnormal   Collection Time: 08/28/17  4:11 AM  Result Value Ref Range   Glucose-Capillary 164 (H) 65 - 99 mg/dL   Comment 1 Notify RN    Comment 2 Document in Chart   Glucose, capillary     Status: Abnormal   Collection Time: 08/28/17  7:51 AM  Result Value Ref Range   Glucose-Capillary 199 (H) 65 - 99 mg/dL  Comprehensive metabolic panel     Status: Abnormal   Collection Time: 08/28/17  8:18 AM  Result Value Ref Range   Sodium 137 135 - 145 mmol/L   Potassium 4.1 3.5 - 5.1 mmol/L   Chloride 102 101 - 111 mmol/L   CO2 22 22 - 32 mmol/L   Glucose, Bld 194 (H) 65 - 99 mg/dL   BUN 38 (H) 6 - 20 mg/dL   Creatinine, Ser 1.35 (H) 0.61 - 1.24 mg/dL   Calcium 9.0 8.9 - 10.3 mg/dL   Total Protein 7.6 6.5 - 8.1 g/dL   Albumin 3.9 3.5 - 5.0 g/dL   AST 17 15 - 41 U/L   ALT 24 17 - 63 U/L   Alkaline Phosphatase 75 38 - 126 U/L   Total Bilirubin 0.9 0.3 - 1.2 mg/dL   GFR calc non Af Amer 59 (L) >60 mL/min   GFR calc Af Amer >60 >60 mL/min    Comment: (NOTE) The eGFR has been calculated using the CKD EPI equation. This calculation has not been validated in all clinical situations. eGFR's persistently <60 mL/min signify possible Chronic Kidney Disease.    Anion gap 13 5  - 15  Magnesium     Status: None   Collection Time: 08/28/17  8:18 AM  Result Value Ref Range   Magnesium 2.3 1.7 - 2.4 mg/dL  Brain natriuretic peptide     Status: None   Collection Time: 08/28/17  8:18 AM  Result Value Ref Range   B Natriuretic Peptide 57.3 0.0 - 100.0 pg/mL  Glucose, capillary     Status: Abnormal   Collection Time: 08/28/17 12:08 PM  Result Value Ref Range   Glucose-Capillary 202 (H) 65 - 99 mg/dL   Comment 1 Notify RN    Comment 2 Document in Chart   Glucose, capillary     Status: Abnormal   Collection Time: 08/28/17  4:51 PM  Result Value Ref Range   Glucose-Capillary 157 (H) 65 - 99 mg/dL   Comment 1 Notify RN    Comment 2 Document in Chart   Glucose, capillary     Status: Abnormal   Collection Time: 08/28/17  8:16 PM  Result Value Ref Range   Glucose-Capillary 179 (H) 65 - 99 mg/dL   Comment 1 Notify RN    Comment 2 Document in Chart   Glucose, capillary     Status: None   Collection Time: 08/29/17 12:01 AM  Result Value Ref Range   Glucose-Capillary 82 65 - 99 mg/dL   Comment 1 Notify RN    Comment 2 Document in Chart   Glucose, capillary     Status: None   Collection Time: 08/29/17  4:14 AM  Result Value Ref Range   Glucose-Capillary 95 65 - 99 mg/dL  Comprehensive metabolic panel     Status: Abnormal   Collection Time: 08/29/17  4:15 AM  Result Value Ref Range   Sodium 136 135 - 145 mmol/L   Potassium 3.8 3.5 - 5.1 mmol/L   Chloride 98 (L) 101 - 111 mmol/L   CO2 24 22 - 32 mmol/L   Glucose, Bld 106 (H) 65 - 99 mg/dL   BUN 38 (H) 6 - 20 mg/dL   Creatinine, Ser 1.43 (H) 0.61 - 1.24 mg/dL   Calcium 8.8 (L) 8.9 - 10.3 mg/dL   Total Protein 7.5 6.5 - 8.1 g/dL   Albumin 3.7 3.5 - 5.0 g/dL   AST 16 15 - 41 U/L   ALT 19 17 - 63 U/L   Alkaline Phosphatase 66 38 - 126 U/L   Total Bilirubin 1.4 (H) 0.3 - 1.2 mg/dL   GFR calc non Af Amer 55 (L) >60 mL/min   GFR calc Af Amer >60 >60 mL/min    Comment: (NOTE) The eGFR has been calculated using  the CKD EPI equation. This calculation has not been validated in all clinical situations. eGFR's persistently <60 mL/min signify possible Chronic Kidney Disease.    Anion gap 14 5 - 15  Magnesium     Status: None   Collection Time: 08/29/17  4:15 AM  Result Value Ref Range   Magnesium 2.4 1.7 - 2.4 mg/dL  Glucose, capillary     Status: Abnormal   Collection Time: 08/29/17  7:46 AM  Result Value Ref Range   Glucose-Capillary 114 (H) 65 - 99 mg/dL   Comment 1 Notify RN    Comment 2 Document in Chart   Glucose, capillary     Status: Abnormal   Collection Time: 08/29/17 12:17 PM  Result Value Ref Range   Glucose-Capillary 153 (H) 65 - 99 mg/dL   Comment 1 Notify RN    Comment 2 Document in Chart     Dg Abd 1 View  Result Date: 08/28/2017 CLINICAL DATA:  Left lower quadrant abdominal pain EXAM: ABDOMEN - 1 VIEW COMPARISON:  CT abdomen pelvis 12/11/2013 FINDINGS: The bowel gas pattern is normal. No radio-opaque calculi or other significant radiographic abnormality  are seen. IMPRESSION: Negative. Electronically Signed   By: Ulyses Jarred M.D.   On: 08/28/2017 01:41   Ct Abdomen Pelvis W Contrast  Result Date: 08/28/2017 CLINICAL DATA:  Abdominal pain. EXAM: CT ABDOMEN AND PELVIS WITH CONTRAST TECHNIQUE: Multidetector CT imaging of the abdomen and pelvis was performed using the standard protocol following bolus administration of intravenous contrast. CONTRAST:  100 cc ISOVUE-300 IOPAMIDOL (ISOVUE-300) INJECTION 61% COMPARISON:  Abdominal radiograph 08/28/2017 FINDINGS: Lower chest: Left lower lobe atelectasis versus scarring. Mildly enlarged heart. Small hiatal hernia. Hepatobiliary: Hepatic steatosis. Normal appearance of the gallbladder. No biliary ductal dilation. Pancreas: Unremarkable. No pancreatic ductal dilatation or surrounding inflammatory changes. Spleen: Normal in size without focal abnormality. Adrenals/Urinary Tract: Adrenal glands are unremarkable. Kidneys are normal, without  renal calculi, focal lesion, or hydronephrosis. Bladder is unremarkable. Stomach/Bowel: Stomach is within normal limits. Appendix appears normal. No evidence of small bowel wall thickening, distention, or inflammatory changes. Relatively short segment of asymmetric mucosal thickening of the distal sigmoid colon, image 69/105, sequence, on the background of numerous diverticula. There are associated pericolonic inflammatory changes and small amount of free fluid. Possible small focus of extraluminal gas is seen medially to the abnormal bowel, image 60/105, sequence 3. Micro rupture cannot be excluded with this appearance. No evidence of frank abscess formation. Scattered diverticular throughout the left colon. Vascular/Lymphatic: Aortic atherosclerosis. No enlarged abdominal or pelvic lymph nodes. Reproductive: Prostate is unremarkable. Other: No abdominal wall hernia or abnormality. No abdominopelvic ascites. Musculoskeletal: No acute or significant osseous findings. IMPRESSION: Relatively short segment of asymmetric mucosal thickening of the distal sigmoid colon, on the background of numerous diverticula, with associated pericolonic inflammatory changes, small amount of free fluid and possible small focus of extraluminal gas. Findings are most suggestive of acute diverticulitis, with possible micro rupture. No evidence of frank abscess formation. Underlying malignancy cannot be entirely excluded, and therefore evaluation with colonoscopy upon resolution of the acute symptoms may be considered. Hepatic steatosis. Small hiatal hernia. Mildly enlarged heart. Electronically Signed   By: Fidela Salisbury M.D.   On: 08/28/2017 12:41    Review of Systems  Constitutional: Negative for chills, diaphoresis, fever, malaise/fatigue and weight loss.  HENT: Negative.   Eyes: Negative.   Respiratory: Positive for shortness of breath.   Cardiovascular: Positive for orthopnea.  Gastrointestinal: Positive for abdominal  pain. Negative for blood in stool, constipation, diarrhea, heartburn, melena, nausea and vomiting.  Genitourinary: Negative.   Musculoskeletal: Positive for back pain.  Skin: Negative.   Neurological: Negative.  Negative for weakness.  Endo/Heme/Allergies: Negative.   Psychiatric/Behavioral: Negative.    Blood pressure 112/63, pulse 78, temperature 98.5 F (36.9 C), temperature source Oral, resp. rate 18, height '5\' 9"'$  (1.753 m), weight 95.5 kg (210 lb 9.6 oz), SpO2 94 %. Physical Exam  Constitutional: He is oriented to person, place, and time. He appears well-developed and well-nourished. No distress.  HENT:  Head: Normocephalic and atraumatic.  Mouth/Throat: Oropharynx is clear and moist. No oropharyngeal exudate.  Eyes: Right eye exhibits no discharge. Left eye exhibits no discharge. No scleral icterus.  Pupils are equal  Neck: Normal range of motion. Neck supple. No JVD present. No tracheal deviation present. No thyromegaly present.  Cardiovascular: Normal rate, regular rhythm, normal heart sounds and intact distal pulses.  No murmur heard. Respiratory: No respiratory distress. He has no wheezes. He has rales (both bases). He exhibits no tenderness.  GI: Soft. He exhibits distension. He exhibits no mass. There is tenderness (primiarly the right side). There  is no rebound and no guarding.  Musculoskeletal: He exhibits edema. He exhibits no tenderness.  Lymphadenopathy:    He has no cervical adenopathy.  Neurological: He is alert and oriented to person, place, and time. No cranial nerve deficit.  Skin: Skin is warm. No rash noted. He is not diaphoretic. No erythema. No pallor.  Psychiatric: He has a normal mood and affect. His behavior is normal. Judgment and thought content normal.   Anti-infectives (From admission, onward)   Start     Dose/Rate Route Frequency Ordered Stop   08/28/17 1400  levofloxacin (LEVAQUIN) IVPB 750 mg     750 mg 100 mL/hr over 90 Minutes Intravenous Every  24 hours 08/28/17 1347     08/28/17 1400  metroNIDAZOLE (FLAGYL) IVPB 500 mg     500 mg 100 mL/hr over 60 Minutes Intravenous Every 8 hours 08/28/17 1347     08/25/17 1230  azithromycin (ZITHROMAX) tablet 500 mg  Status:  Discontinued     500 mg Oral Daily 08/25/17 1227 08/28/17 1358      Assessment/Plan: Sigmoid diverticulitis with microperforation Acute respiratory distress CHF - acute on chronic (EF 30-35% now up to 40-45%) Type II diabetes  - A1C 7.8 Hypertension with hypertensive urgency - now controlled Mild renal insuffiencey  - creatinine is  1.43 Body mass index is 31 Non compliance - out of Medicines for some time  Plan:  Agree with antibiotics, we currently recommend Flagyl/Rocephin for mild to moderate diverticulitis.  He is on flagyl and Levaquin which is what we use for PCN allergies, so I can't see a reason to change it. WE will follow with you.  Hopefully he can get over this and then get a colonoscopy in about 6 weeks with further follow up.  He says they are arranging insurance for him.    Sonia Stickels 08/29/2017, 2:18 PM

## 2017-08-29 NOTE — Progress Notes (Signed)
PROGRESS NOTE    Kenneth Rios  ZOX:[id number] DOB: 10/01/1965 DOA: 08/24/2017 PCP: Patient, No Pcp Per   Brief Narrative:   52 year old male with history of chronic CHF, hypertension, diabetes type 2 came to the hospital with complaints of dyspnea.  He was found to be hypertensive emergency, COPD exacerbation and concerns of fluid overload.  His rhinovirus came back positive.  He was started on breathing treatment along with diuresis.  D-dimer was noted to be elevated therefore CT of the chest was done which was negative for pulmonary embolism.   Assessment & Plan:   Principal Problem:   CHF, acute on chronic (HCC) Active Problems:   Diabetes mellitus without complication (HCC)   Hypertension   Bronchospasm   Hypertensive urgency  Acute respiratory distress: Resolved. Due to viral bronchitis (rhinovirus) causing COPD exacerbation and an element of pulmonary edema from acute CHF. No PE on CTA chest.  - Continue prednisone burst, antitussives, and prn bronchodilators.   Acute sigmoid diverticulitis with possible microperforation: No abscess.  - Continue IV analgesics - IV levaquin, flagyl ordered 1/20.  - Due to recurrent nature, would appreciate general surgery consulting.  - Will need follow up colonoscopy.   Acute combined diastolic and systolic CHF: Improved. Down a cumulative 10L from admission. BNP wnl. Echo showed improved EF from 30-35% to 45-50%.  - Further decrease lasix to 20mg  IV daily and monitor weights, I/O, Cr.  - Continue lisinopril, coreg  Hypertensive urgency: Resolved since admission.  - Continue coreg, lisinopril, titrate prn  T2DM: HbA1c 7.8%.  - Continue lantus and SSI. No hypoglycemia. - Tapering steroids as above.   DVT prophylaxis: Ambulation. Code Status: Full code Family Communication: Fiance at bedside Disposition Plan: Home once clinically improved. Consultants: General surgery  Subjective: Abdominal pain remains intermittent, severe,  radiating from LLQ. Not hungry at all. No BM. No fever. The frequency of pain is diminished from previously.  Objective: Vitals:   08/28/17 2102 08/29/17 0119 08/29/17 0452 08/29/17 1033  BP:  (!) 142/87 116/75 (!) 154/90  Pulse:  75 86 90  Resp:  18 18   Temp:   98.5 F (36.9 C)   TempSrc:   Oral   SpO2: 98% 99% 100%   Weight:   95.5 kg (210 lb 9.6 oz)   Height:        Intake/Output Summary (Last 24 hours) at 08/29/2017 1316 Last data filed at 08/29/2017 0850 Gross per 24 hour  Intake 320 ml  Output 1500 ml  Net -1180 ml   Filed Weights   08/27/17 0439 08/28/17 0414 08/29/17 0452  Weight: 95.6 kg (210 lb 12.8 oz) 95.3 kg (210 lb 3.2 oz) 95.5 kg (210 lb 9.6 oz)   Examination: Gen: Uncomfortable-appearing 51 y.o.male in NAD HEENT: PERRL, MMM, posterior oropharynx clear, fair dentition Pulm: Non-labored, no crackles or wheezes noted. CV: Regular rate, no murmur; distal pulses intact/symmetric; No LE edema GI: + BS; soft, diffusely tender, worst in LLQ with guarding. No rigidity.  Skin: No rashes, wounds, ulcers MSK: Normal gait and station; no digital clubbing/cyanosis Neuro: Alert, oriented, no focal deficits.   CBC: Recent Labs  Lab 08/24/17 1416 08/25/17 0407  WBC 6.6 7.0  NEUTROABS  --  6.3  HGB 12.5* 12.9*  HCT 36.5* 37.2*  MCV 82.0 81.4  PLT 323 327   Basic Metabolic Panel: Recent Labs  Lab 08/25/17 0407 08/26/17 0613 08/27/17 0854 08/28/17 0818 08/29/17 0415  NA 137 133* 133* 137 136  K 4.0 4.2  4.1 4.1 3.8  CL 103 101 100* 102 98*  CO2 21* 21* 19* 22 24  GLUCOSE 275* 261* 344* 194* 106*  BUN 14 25* 31* 38* 38*  CREATININE 1.07 1.10 1.19 1.35* 1.43*  CALCIUM 9.1 9.3 9.0 9.0 8.8*  MG  --   --  2.5* 2.3 2.4   GFR: Estimated Creatinine Clearance: 69.7 mL/min (A) (by C-G formula based on SCr of 1.43 mg/dL (H)). Liver Function Tests: Recent Labs  Lab 08/26/17 0613 08/27/17 0854 08/28/17 0818 08/29/17 0415  AST 18 18 17 16   ALT 34 29 24 19     ALKPHOS 73 75 75 66  BILITOT 0.7 0.7 0.9 1.4*  PROT 7.5 7.5 7.6 7.5  ALBUMIN 4.0 3.9 3.9 3.7   Recent Results (from the past 240 hour(s))  Respiratory Panel by PCR     Status: Abnormal   Collection Time: 08/25/17  5:00 AM  Result Value Ref Range Status   Adenovirus NOT DETECTED NOT DETECTED Final   Coronavirus 229E NOT DETECTED NOT DETECTED Final   Coronavirus HKU1 NOT DETECTED NOT DETECTED Final   Coronavirus NL63 NOT DETECTED NOT DETECTED Final   Coronavirus OC43 NOT DETECTED NOT DETECTED Final   Metapneumovirus NOT DETECTED NOT DETECTED Final   Rhinovirus / Enterovirus DETECTED (A) NOT DETECTED Final   Influenza A NOT DETECTED NOT DETECTED Final   Influenza B NOT DETECTED NOT DETECTED Final   Parainfluenza Virus 1 NOT DETECTED NOT DETECTED Final   Parainfluenza Virus 2 NOT DETECTED NOT DETECTED Final   Parainfluenza Virus 3 NOT DETECTED NOT DETECTED Final   Parainfluenza Virus 4 NOT DETECTED NOT DETECTED Final   Respiratory Syncytial Virus NOT DETECTED NOT DETECTED Final   Bordetella pertussis NOT DETECTED NOT DETECTED Final   Chlamydophila pneumoniae NOT DETECTED NOT DETECTED Final   Mycoplasma pneumoniae NOT DETECTED NOT DETECTED Final    Radiology Studies: Dg Abd 1 View  Result Date: 08/28/2017 CLINICAL DATA:  Left lower quadrant abdominal pain EXAM: ABDOMEN - 1 VIEW COMPARISON:  CT abdomen pelvis 12/11/2013 FINDINGS: The bowel gas pattern is normal. No radio-opaque calculi or other significant radiographic abnormality are seen. IMPRESSION: Negative. Electronically Signed   By: Deatra RobinsonKevin  Herman M.D.   On: 08/28/2017 01:41   Ct Abdomen Pelvis W Contrast  Result Date: 08/28/2017 CLINICAL DATA:  Abdominal pain. EXAM: CT ABDOMEN AND PELVIS WITH CONTRAST TECHNIQUE: Multidetector CT imaging of the abdomen and pelvis was performed using the standard protocol following bolus administration of intravenous contrast. CONTRAST:  100 cc ISOVUE-300 IOPAMIDOL (ISOVUE-300) INJECTION 61%  COMPARISON:  Abdominal radiograph 08/28/2017 FINDINGS: Lower chest: Left lower lobe atelectasis versus scarring. Mildly enlarged heart. Small hiatal hernia. Hepatobiliary: Hepatic steatosis. Normal appearance of the gallbladder. No biliary ductal dilation. Pancreas: Unremarkable. No pancreatic ductal dilatation or surrounding inflammatory changes. Spleen: Normal in size without focal abnormality. Adrenals/Urinary Tract: Adrenal glands are unremarkable. Kidneys are normal, without renal calculi, focal lesion, or hydronephrosis. Bladder is unremarkable. Stomach/Bowel: Stomach is within normal limits. Appendix appears normal. No evidence of small bowel wall thickening, distention, or inflammatory changes. Relatively short segment of asymmetric mucosal thickening of the distal sigmoid colon, image 69/105, sequence, on the background of numerous diverticula. There are associated pericolonic inflammatory changes and small amount of free fluid. Possible small focus of extraluminal gas is seen medially to the abnormal bowel, image 60/105, sequence 3. Micro rupture cannot be excluded with this appearance. No evidence of frank abscess formation. Scattered diverticular throughout the left colon. Vascular/Lymphatic: Aortic atherosclerosis. No  enlarged abdominal or pelvic lymph nodes. Reproductive: Prostate is unremarkable. Other: No abdominal wall hernia or abnormality. No abdominopelvic ascites. Musculoskeletal: No acute or significant osseous findings. IMPRESSION: Relatively short segment of asymmetric mucosal thickening of the distal sigmoid colon, on the background of numerous diverticula, with associated pericolonic inflammatory changes, small amount of free fluid and possible small focus of extraluminal gas. Findings are most suggestive of acute diverticulitis, with possible micro rupture. No evidence of frank abscess formation. Underlying malignancy cannot be entirely excluded, and therefore evaluation with colonoscopy  upon resolution of the acute symptoms may be considered. Hepatic steatosis. Small hiatal hernia. Mildly enlarged heart. Electronically Signed   By: Ted Mcalpine M.D.   On: 08/28/2017 12:41   Scheduled Meds: . aspirin  81 mg Oral Daily  . atorvastatin  10 mg Oral Daily  . carvedilol  10 mg Oral Daily  . enoxaparin (LOVENOX) injection  40 mg Subcutaneous Q24H  . furosemide  20 mg Intravenous Daily  . insulin aspart  0-9 Units Subcutaneous Q4H  . insulin aspart  4 Units Subcutaneous TID AC  . insulin glargine  24 Units Subcutaneous Daily  . lisinopril  20 mg Oral Daily  . predniSONE  40 mg Oral Q breakfast  . sodium chloride flush  3 mL Intravenous Q12H   Continuous Infusions: . sodium chloride    . levofloxacin (LEVAQUIN) IV Stopped (08/28/17 1644)  . metronidazole 500 mg (08/29/17 0542)     LOS: 5 days    Time spent: 25 mins   Hazeline Junker, MD [facility] Pager [phone]   If 7PM-7AM, please contact night-coverage [email or web address] Password [email or web address] 08/29/2017, 1:16 PM

## 2017-08-29 NOTE — Progress Notes (Signed)
Patient has 15 beats of v-tach. Patient stated he had "SOB for a quick second ". Patient vitals are stable and MD notified. Will continue to monitor.   Elsie Lincolnaven Emalia Witkop, RN

## 2017-08-30 DIAGNOSIS — K5792 Diverticulitis of intestine, part unspecified, without perforation or abscess without bleeding: Secondary | ICD-10-CM

## 2017-08-30 DIAGNOSIS — J208 Acute bronchitis due to other specified organisms: Secondary | ICD-10-CM

## 2017-08-30 LAB — BASIC METABOLIC PANEL
Anion gap: 12 (ref 5–15)
BUN: 35 mg/dL — ABNORMAL HIGH (ref 6–20)
CO2: 26 mmol/L (ref 22–32)
Calcium: 8.7 mg/dL — ABNORMAL LOW (ref 8.9–10.3)
Chloride: 97 mmol/L — ABNORMAL LOW (ref 101–111)
Creatinine, Ser: 1.24 mg/dL (ref 0.61–1.24)
GFR calc Af Amer: >60 mL/min (ref >60)
GFR calc non Af Amer: >60 mL/min (ref >60)
Glucose, Bld: 110 mg/dL — ABNORMAL HIGH (ref 65–99)
Potassium: 3.6 mmol/L (ref 3.5–5.1)
Sodium: 135 mmol/L (ref 135–145)

## 2017-08-30 LAB — GLUCOSE, CAPILLARY
GLUCOSE-CAPILLARY: 184 mg/dL — AB (ref 65–99)
GLUCOSE-CAPILLARY: 127 mg/dL — AB (ref 65–99)
Glucose-Capillary: 119 mg/dL — ABNORMAL HIGH (ref 65–99)
Glucose-Capillary: 84 mg/dL (ref 65–99)

## 2017-08-30 LAB — CBC
HCT: 41.8 % (ref 39.0–52.0)
Hemoglobin: 14.2 g/dL (ref 13.0–17.0)
MCH: 28.4 pg (ref 26.0–34.0)
MCHC: 34 g/dL (ref 30.0–36.0)
MCV: 83.6 fL (ref 78.0–100.0)
Platelets: 444 10*3/uL — ABNORMAL HIGH (ref 150–400)
RBC: 5 MIL/uL (ref 4.22–5.81)
RDW: 13.1 % (ref 11.5–15.5)
WBC: 13.1 10*3/uL — ABNORMAL HIGH (ref 4.0–10.5)

## 2017-08-30 MED ORDER — METRONIDAZOLE 500 MG PO TABS: 500.0000 mg | ORAL_TABLET | Freq: Three times a day (TID) | ORAL | 0 refills | Status: DC

## 2017-08-30 MED ORDER — OXYCODONE-ACETAMINOPHEN 5-325 MG PO TABS: 1.0000 | ORAL_TABLET | Freq: Four times a day (QID) | ORAL | 0 refills | Status: DC | PRN

## 2017-08-30 MED ORDER — METRONIDAZOLE 500 MG PO TABS
500.0000 mg | ORAL_TABLET | Freq: Three times a day (TID) | ORAL | Status: DC
  Administered 2017-08-30: 500 mg via ORAL
  Filled 2017-08-30: qty 1

## 2017-08-30 MED ORDER — LEVOFLOXACIN 750 MG PO TABS: 750.0000 mg | ORAL_TABLET | Freq: Every day | ORAL | 0 refills | Status: DC

## 2017-08-30 MED ORDER — LEVOFLOXACIN 750 MG PO TABS
750.0000 mg | ORAL_TABLET | Freq: Every day | ORAL | Status: DC
  Administered 2017-08-30: 750 mg via ORAL
  Filled 2017-08-30: qty 1

## 2017-08-30 NOTE — Progress Notes (Signed)
Pt is ambulating several times in a hallway overnight, pain medicines given around the clock, IVABX going on, slept on and off,will continue to monitor the patient.  Lonia Farberekha, RN

## 2017-08-30 NOTE — Progress Notes (Signed)
Subjective/Chief Complaint: Pt with better abd pain States reg BMs    Objective: Vital signs in last 24 hours: Temp:  [97.7 F (36.5 C)-98.5 F (36.9 C)] 97.7 F (36.5 C) (01/22 0506) Pulse Rate:  [77-90] 84 (01/22 0506) Resp:  [18] 18 (01/22 0506) BP: (112-154)/(63-91) 142/91 (01/22 0506) SpO2:  [94 %-100 %] 98 % (01/22 0506) Weight:  [95 kg (209 lb 6.4 oz)] 95 kg (209 lb 6.4 oz) (01/22 0506) Last BM Date: 08/27/17  Intake/Output from previous day: 01/21 0701 - 01/22 0700 In: 680 [P.O.:680] Out: 2250 [Urine:2250] Intake/Output this shift: Total I/O In: -  Out: 200 [Urine:200]  Constitutional: No acute distress, conversant, appears states age. Eyes: Anicteric sclerae, moist conjunctiva, no lid lag Lungs: Clear to auscultation bilaterally, normal respiratory effort CV: regular rate and rhythm, no murmurs, no peripheral edema, pedal pulses 2+ GI: Soft, no masses or hepatosplenomegaly, min tender to palpation LLQ Skin: No rashes, palpation reveals normal turgor Psychiatric: appropriate judgment and insight, oriented to person, place, and time   Lab Results:  Recent Labs    08/30/17 0448  WBC 13.1*  HGB 14.2  HCT 41.8  PLT 444*   BMET Recent Labs    08/29/17 0415 08/30/17 0448  NA 136 135  K 3.8 3.6  CL 98* 97*  CO2 24 26  GLUCOSE 106* 110*  BUN 38* 35*  CREATININE 1.43* 1.24  CALCIUM 8.8* 8.7*   Studies/Results: Ct Abdomen Pelvis W Contrast  Result Date: 08/28/2017 CLINICAL DATA:  Abdominal pain. EXAM: CT ABDOMEN AND PELVIS WITH CONTRAST TECHNIQUE: Multidetector CT imaging of the abdomen and pelvis was performed using the standard protocol following bolus administration of intravenous contrast. CONTRAST:  100 cc ISOVUE-300 IOPAMIDOL (ISOVUE-300) INJECTION 61% COMPARISON:  Abdominal radiograph 08/28/2017 FINDINGS: Lower chest: Left lower lobe atelectasis versus scarring. Mildly enlarged heart. Small hiatal hernia. Hepatobiliary: Hepatic steatosis.  Normal appearance of the gallbladder. No biliary ductal dilation. Pancreas: Unremarkable. No pancreatic ductal dilatation or surrounding inflammatory changes. Spleen: Normal in size without focal abnormality. Adrenals/Urinary Tract: Adrenal glands are unremarkable. Kidneys are normal, without renal calculi, focal lesion, or hydronephrosis. Bladder is unremarkable. Stomach/Bowel: Stomach is within normal limits. Appendix appears normal. No evidence of small bowel wall thickening, distention, or inflammatory changes. Relatively short segment of asymmetric mucosal thickening of the distal sigmoid colon, image 69/105, sequence, on the background of numerous diverticula. There are associated pericolonic inflammatory changes and small amount of free fluid. Possible small focus of extraluminal gas is seen medially to the abnormal bowel, image 60/105, sequence 3. Micro rupture cannot be excluded with this appearance. No evidence of frank abscess formation. Scattered diverticular throughout the left colon. Vascular/Lymphatic: Aortic atherosclerosis. No enlarged abdominal or pelvic lymph nodes. Reproductive: Prostate is unremarkable. Other: No abdominal wall hernia or abnormality. No abdominopelvic ascites. Musculoskeletal: No acute or significant osseous findings. IMPRESSION: Relatively short segment of asymmetric mucosal thickening of the distal sigmoid colon, on the background of numerous diverticula, with associated pericolonic inflammatory changes, small amount of free fluid and possible small focus of extraluminal gas. Findings are most suggestive of acute diverticulitis, with possible micro rupture. No evidence of frank abscess formation. Underlying malignancy cannot be entirely excluded, and therefore evaluation with colonoscopy upon resolution of the acute symptoms may be considered. Hepatic steatosis. Small hiatal hernia. Mildly enlarged heart. Electronically Signed   By: Ted Mcalpineobrinka  Dimitrova M.D.   On: 08/28/2017  12:41    Anti-infectives: Anti-infectives (From admission, onward)   Start  Dose/Rate Route Frequency Ordered Stop   08/28/17 1400  levofloxacin (LEVAQUIN) IVPB 750 mg     750 mg 100 mL/hr over 90 Minutes Intravenous Every 24 hours 08/28/17 1347     08/28/17 1400  metroNIDAZOLE (FLAGYL) IVPB 500 mg     500 mg 100 mL/hr over 60 Minutes Intravenous Every 8 hours 08/28/17 1347     08/25/17 1230  azithromycin (ZITHROMAX) tablet 500 mg  Status:  Discontinued     500 mg Oral Daily 08/25/17 1227 08/28/17 1358      Assessment/Plan: Sigmoid diverticulitis with microperforation Acute respiratory distress CHF - acute on chronic (EF 30-35% now up to 40-45%) Type II diabetes  - A1C 7.8 Hypertension with hypertensive urgency - now controlled Mild renal insuffiencey  - creatinine is  1.43 Body mass index is 31 Non compliance - out of Medicines for some time  Plan: Pt improving clincally. Would adv diet as tol for now Will need 2 weeks outpt abx Will need f/u colonoscopy in 6-8weeks If con't to improve woul dbe OK for DC from surgery standpoint.    LOS: 6 days    Marigene Ehlers., [facility] 08/30/2017

## 2017-08-30 NOTE — Discharge Summary (Signed)
Physician Discharge Summary  Kenneth Rios WUJ:[id number] DOB: 02/07/1966 DOA: 08/24/2017  PCP: Patient, No Pcp Per  Admit date: 08/24/2017 Discharge date: 08/30/2017  Admitted From: Home Disposition: Home   Recommendations for Outpatient Follow-up:  1. Follow up with PCP. App[facility].  2. Follow up with GI in 6 weeks for colonoscopy  Home Health: None Equipment/Devices: None Discharge Condition: Stable CODE STATUS: Full Diet recommendation: Heart healthy, carb-modified  Brief/Interim Summary: Kenneth Rios is a 52 year old male with history of chronic CHF, hypertension, diabetes type 2 came to the hospital with complaints of dyspnea.  He was found to be hypertensive emergency, COPD exacerbation and concerns of fluid overload.  His rhinovirus came back positive.  He was started on breathing treatment along with diuresis.  D-dimer was noted to be elevated therefore CT of the chest was done which was negative for pulmonary embolism.  Discharge Diagnoses:  Principal Problem:   CHF, acute on chronic (HCC) Active Problems:   Diabetes mellitus without complication (HCC)   Hypertension   Acute viral bronchitis   Hypertensive urgency   Acute diverticulitis  Acute respiratory distress: Resolved. Due to viral bronchitis (rhinovirus) causing COPD exacerbation and an element of pulmonary edema from acute CHF. No PE on CTA chest.  - Completed prednisone burst - Continue prn antitussives, and prn bronchodilators.   Acute sigmoid diverticulitis with possible microperforation: No abscess. Clinically improving. Tolerating a diet, no longer requiring IV antiemetics, analgesics or abx.  - Convert to cipro/flagyl po (both inexpensive cash pay prices) - General surgery was consulted for recurrent attack, no indication for colectomy at this time.  - Will need follow up colonoscopy.   Acute on chronic combined diastolic and systolic CHF: Improved. Down a cumulative  11.8L from admission. BNP wnl. Echo showed improved EF from 30-35% to 45-50%.  - Further decrease lasix to 20mg  daily (home dose)  - Continue lisinopril, coreg  Hypertensive urgency: Resolved since admission.  - Continue coreg, lisinopril, titrate prn  T2DM: HbA1c 7.8%.  - Restart home metformin. No changes were made as current hyperglycemia may be worsened by steroids. Will need PCP follow up. - Tapering steroids as above.   Discharge Instructions Discharge Instructions    Diet - low sodium heart healthy   Complete by:  As directed    Diet Carb Modified   Complete by:  As directed    Discharge instructions   Complete by:  As directed    Take levaquin once daily and flagyl three times daily for 2 weeks. Seek medical attention if your symptoms get worse instead of better or you experience fever or inability to take medications or liquids by mouth.   A follow up appointment has been scheduled for you. It is very important for you to make it to this appoint. You will eventually need a colonoscopy and will also need to improve control of your blood sugars.   Increase activity slowly   Complete by:  As directed      Allergies as of 08/30/2017   No Known Allergies     Medication List    TAKE these medications   atorvastatin 10 MG tablet Commonly known as:  LIPITOR Take 10 mg by mouth daily.   carvedilol 10 MG 24 hr capsule Commonly known as:  COREG CR Take 10 mg by mouth daily.   furosemide 20 MG tablet Commonly known as:  LASIX Take 20 mg by mouth daily.   levofloxacin 750 MG tablet Commonly known  asBarbera Setters Take 1 tablet (750 mg total) by mouth daily. Start taking on:  08/31/2017   lisinopril 10 MG tablet Commonly known as:  PRINIVIL,ZESTRIL Take 10 mg by mouth daily.   metFORMIN 500 MG tablet Commonly known as:  GLUCOPHAGE Take 500 mg by mouth 2 (two) times daily with a meal.   metroNIDAZOLE 500 MG tablet Commonly known as:  FLAGYL Take 1 tablet (500 mg  total) by mouth every 8 (eight) hours.   oxyCODONE-acetaminophen 5-325 MG tablet Commonly known as:  PERCOCET Take 1 tablet by mouth every 6 (six) hours as needed for severe pain.   sodium-potassium bicarbonate Tbef dissolvable tablet Commonly known as:  ALKA-SELTZER GOLD Take 1 tablet by mouth daily as needed (cold/cough).   THERAFLU MULTI SYMPTOM PO Take 30 mLs by mouth 2 (two) times daily.      Follow-up Information    [facility]. Go on 09/16/2017.   Why:  @8 :30am Contact information: [address] [place] [address] [phone]         No Known Allergies  Consultations:  General surgery  Procedures/Studies: Dg Chest 2 View  Result Date: 08/24/2017 CLINICAL DATA:  Cough and congestion EXAM: CHEST  2 VIEW COMPARISON:  None. FINDINGS: There is no edema or consolidation. Heart is mildly enlarged with pulmonary vascularity within normal limits. No adenopathy. No bone lesions. IMPRESSION: Mild cardiomegaly.  No edema or consolidation. Electronically Signed   By: Bretta Bang III M.D.   On: 08/24/2017 15:27   Dg Abd 1 View  Result Date: 08/28/2017 CLINICAL DATA:  Left lower quadrant abdominal pain EXAM: ABDOMEN - 1 VIEW COMPARISON:  CT abdomen pelvis 12/11/2013 FINDINGS: The bowel gas pattern is normal. No radio-opaque calculi or other significant radiographic abnormality are seen. IMPRESSION: Negative. Electronically Signed   By: Deatra Robinson M.D.   On: 08/28/2017 01:41   Ct Angio Chest Pe W Or Wo Contrast  Result Date: 08/26/2017 CLINICAL DATA:  Dyspnea for 3-4 weeks.  Elevated D-dimer. EXAM: CT ANGIOGRAPHY CHEST WITH CONTRAST TECHNIQUE: Multidetector CT imaging of the chest was performed using the standard protocol during bolus administration of intravenous contrast. Multiplanar CT image reconstructions and MIPs were obtained to evaluate the vascular anatomy. CONTRAST:  58 cc ISOVUE-370 IOPAMIDOL (ISOVUE-370)  INJECTION 76% COMPARISON:  08/24/2017 chest radiograph. FINDINGS: Cardiovascular: The study is low to moderate quality for the evaluation of pulmonary embolism, limited by slightly suboptimal contrast opacification and by motion artifact. There are no convincing filling defects in the central, lobar, segmental or subsegmental pulmonary artery branches to suggest acute pulmonary embolism. Mildly atherosclerotic nonaneurysmal thoracic aorta. Dilated main pulmonary artery (3.6 cm diameter). Mild cardiomegaly. Left anterior descending and right coronary atherosclerosis. No significant pericardial effusion/thickening. Mediastinum/Nodes: No discrete thyroid nodules. Unremarkable esophagus. No pathologically enlarged axillary, mediastinal or hilar lymph nodes. Lungs/Pleura: No pneumothorax. No pleural effusion. Subsegmental bilateral lower lobe atelectasis. No acute consolidative airspace disease, lung masses or significant pulmonary nodules. Minimal scattered interlobular septal thickening in both lungs. Upper abdomen: No acute abnormality. Musculoskeletal: No aggressive appearing focal osseous lesions. Minimal thoracic spondylosis. Review of the MIP images confirms the above findings. IMPRESSION: 1. Limited scan.  No evidence of pulmonary embolism. 2. Cardiomegaly. 3. Dilated main pulmonary artery, suggesting pulmonary arterial hypertension. 4. Minimal scattered interlobular septal thickening in the lungs, suggesting minimal pulmonary edema. 5. Two-vessel coronary atherosclerosis. Aortic Atherosclerosis (ICD10-I70.0). Electronically Signed   By: Delbert Phenix M.D.   On: 08/26/2017 01:10  Ct Abdomen Pelvis W Contrast  Result Date: 08/28/2017 CLINICAL DATA:  Abdominal pain. EXAM: CT ABDOMEN AND PELVIS WITH CONTRAST TECHNIQUE: Multidetector CT imaging of the abdomen and pelvis was performed using the standard protocol following bolus administration of intravenous contrast. CONTRAST:  100 cc ISOVUE-300 IOPAMIDOL  (ISOVUE-300) INJECTION 61% COMPARISON:  Abdominal radiograph 08/28/2017 FINDINGS: Lower chest: Left lower lobe atelectasis versus scarring. Mildly enlarged heart. Small hiatal hernia. Hepatobiliary: Hepatic steatosis. Normal appearance of the gallbladder. No biliary ductal dilation. Pancreas: Unremarkable. No pancreatic ductal dilatation or surrounding inflammatory changes. Spleen: Normal in size without focal abnormality. Adrenals/Urinary Tract: Adrenal glands are unremarkable. Kidneys are normal, without renal calculi, focal lesion, or hydronephrosis. Bladder is unremarkable. Stomach/Bowel: Stomach is within normal limits. Appendix appears normal. No evidence of small bowel wall thickening, distention, or inflammatory changes. Relatively short segment of asymmetric mucosal thickening of the distal sigmoid colon, image 69/105, sequence, on the background of numerous diverticula. There are associated pericolonic inflammatory changes and small amount of free fluid. Possible small focus of extraluminal gas is seen medially to the abnormal bowel, image 60/105, sequence 3. Micro rupture cannot be excluded with this appearance. No evidence of frank abscess formation. Scattered diverticular throughout the left colon. Vascular/Lymphatic: Aortic atherosclerosis. No enlarged abdominal or pelvic lymph nodes. Reproductive: Prostate is unremarkable. Other: No abdominal wall hernia or abnormality. No abdominopelvic ascites. Musculoskeletal: No acute or significant osseous findings. IMPRESSION: Relatively short segment of asymmetric mucosal thickening of the distal sigmoid colon, on the background of numerous diverticula, with associated pericolonic inflammatory changes, small amount of free fluid and possible small focus of extraluminal gas. Findings are most suggestive of acute diverticulitis, with possible micro rupture. No evidence of frank abscess formation. Underlying malignancy cannot be entirely excluded, and therefore  evaluation with colonoscopy upon resolution of the acute symptoms may be considered. Hepatic steatosis. Small hiatal hernia. Mildly enlarged heart. Electronically Signed   By: Ted Mcalpine M.D.   On: 08/28/2017 12:41     Subjective: Abdominal pain significantly improved. Tolerating diet, having BMs, breathing back to baseline.   Discharge Exam: Vitals:   08/30/17 0506 08/30/17 0834  BP: (!) 142/91 118/64  Pulse: 84 82  Resp: 18   Temp: 97.7 F (36.5 C)   SpO2: 98%    General: Pt is alert, awake, not in acute distress Cardiovascular: RRR, S1/S2 +, no rubs, no gallops Respiratory: CTA bilaterally, no wheezing, no rhonchi Abdominal: Soft, tender in LLQ without rebound, ND, bowel sounds + Extremities: No edema, no cyanosis  Labs: BNP (last 3 results) Recent Labs    08/24/17 1416 08/28/17 0818  BNP 340.3* 57.3   Basic Metabolic Panel: Recent Labs  Lab 08/26/17 0613 08/27/17 0854 08/28/17 0818 08/29/17 0415 08/30/17 0448  NA 133* 133* 137 136 135  K 4.2 4.1 4.1 3.8 3.6  CL 101 100* 102 98* 97*  CO2 21* 19* 22 24 26   GLUCOSE 261* 344* 194* 106* 110*  BUN 25* 31* 38* 38* 35*  CREATININE 1.10 1.19 1.35* 1.43* 1.24  CALCIUM 9.3 9.0 9.0 8.8* 8.7*  MG  --  2.5* 2.3 2.4  --    Liver Function Tests: Recent Labs  Lab 08/26/17 0613 08/27/17 0854 08/28/17 0818 08/29/17 0415  AST 18 18 17 16   ALT 34 29 24 19   ALKPHOS 73 75 75 66  BILITOT 0.7 0.7 0.9 1.4*  PROT 7.5 7.5 7.6 7.5  ALBUMIN 4.0 3.9 3.9 3.7   No results for input(s): LIPASE, AMYLASE in the last  168 hours. No results for input(s): AMMONIA in the last 168 hours. CBC: Recent Labs  Lab 08/24/17 1416 08/25/17 0407 08/30/17 0448  WBC 6.6 7.0 13.1*  NEUTROABS  --  6.3  --   HGB 12.5* 12.9* 14.2  HCT 36.5* 37.2* 41.8  MCV 82.0 81.4 83.6  PLT 323 327 444*   Cardiac Enzymes: No results for input(s): CKTOTAL, CKMB, CKMBINDEX, TROPONINI in the last 168 hours. BNP: Invalid input(s):  POCBNP CBG: Recent Labs  Lab 08/29/17 2355 08/30/17 0504 08/30/17 0724 08/30/17 0956 08/30/17 1155  GLUCAP 128* 119* 84 184* 127*   D-Dimer No results for input(s): DDIMER in the last 72 hours. Hgb A1c No results for input(s): HGBA1C in the last 72 hours. Lipid Profile No results for input(s): CHOL, HDL, LDLCALC, TRIG, CHOLHDL, LDLDIRECT in the last 72 hours. Thyroid function studies No results for input(s): TSH, T4TOTAL, T3FREE, THYROIDAB in the last 72 hours.  Invalid input(s): FREET3 Anemia work up No results for input(s): VITAMINB12, FOLATE, FERRITIN, TIBC, IRON, RETICCTPCT in the last 72 hours. Urinalysis    Component Value Date/Time   COLORURINE Yellow 12/11/2013 0100   APPEARANCEUR Clear 12/11/2013 0100   LABSPEC 1.054 12/11/2013 0100   PHURINE 7.0 12/11/2013 0100   GLUCOSEU Negative 12/11/2013 0100   HGBUR Negative 12/11/2013 0100   BILIRUBINUR Negative 12/11/2013 0100   KETONESUR Trace 12/11/2013 0100   PROTEINUR 30 mg/dL 96/11/5407 8119   NITRITE Negative 12/11/2013 0100   LEUKOCYTESUR Negative 12/11/2013 0100    Microbiology Recent Results (from the past 240 hour(s))  Respiratory Panel by PCR     Status: Abnormal   Collection Time: 08/25/17  5:00 AM  Result Value Ref Range Status   Adenovirus NOT DETECTED NOT DETECTED Final   Coronavirus 229E NOT DETECTED NOT DETECTED Final   Coronavirus HKU1 NOT DETECTED NOT DETECTED Final   Coronavirus NL63 NOT DETECTED NOT DETECTED Final   Coronavirus OC43 NOT DETECTED NOT DETECTED Final   Metapneumovirus NOT DETECTED NOT DETECTED Final   Rhinovirus / Enterovirus DETECTED (A) NOT DETECTED Final   Influenza A NOT DETECTED NOT DETECTED Final   Influenza B NOT DETECTED NOT DETECTED Final   Parainfluenza Virus 1 NOT DETECTED NOT DETECTED Final   Parainfluenza Virus 2 NOT DETECTED NOT DETECTED Final   Parainfluenza Virus 3 NOT DETECTED NOT DETECTED Final   Parainfluenza Virus 4 NOT DETECTED NOT DETECTED Final    Respiratory Syncytial Virus NOT DETECTED NOT DETECTED Final   Bordetella pertussis NOT DETECTED NOT DETECTED Final   Chlamydophila pneumoniae NOT DETECTED NOT DETECTED Final   Mycoplasma pneumoniae NOT DETECTED NOT DETECTED Final    Time coordinating discharge: Approximately 40 minutes  Hazeline Junker, MD  [facility] 08/30/2017, 12:59 PM Pager [phone]

## 2017-08-30 NOTE — Progress Notes (Signed)
This morning pt felt like eating and no any complain of pain, so RN advance diet to soft from clear liquid, will continue to monitor  Lonia Farberekha, RN

## 2017-08-30 NOTE — Progress Notes (Signed)
Pt discharge instructions reviewed with pt. Pt verbalizes understanding. Pt belongings with pt. Pt is not in distress. Pt discharged via wheelchiar.

## 2017-09-01 ENCOUNTER — Encounter (HOSPITAL_COMMUNITY): Payer: Self-pay | Admitting: *Deleted

## 2017-09-01 ENCOUNTER — Inpatient Hospital Stay (HOSPITAL_COMMUNITY)
Admission: EM | Admit: 2017-09-01 | Discharge: 2017-09-13 | DRG: 330 | Disposition: A | Discharge status: Home Health | Payer: Medicaid Other | Attending: Internal Medicine | Admitting: Internal Medicine

## 2017-09-01 ENCOUNTER — Other Ambulatory Visit: Payer: Self-pay

## 2017-09-01 DIAGNOSIS — I152 Hypertension secondary to endocrine disorders: Secondary | ICD-10-CM | POA: Diagnosis present

## 2017-09-01 DIAGNOSIS — I110 Hypertensive heart disease with heart failure: Secondary | ICD-10-CM | POA: Diagnosis present

## 2017-09-01 DIAGNOSIS — K659 Peritonitis, unspecified: Secondary | ICD-10-CM | POA: Diagnosis present

## 2017-09-01 DIAGNOSIS — E860 Dehydration: Secondary | ICD-10-CM | POA: Diagnosis present

## 2017-09-01 DIAGNOSIS — E785 Hyperlipidemia, unspecified: Secondary | ICD-10-CM | POA: Diagnosis present

## 2017-09-01 DIAGNOSIS — D649 Anemia, unspecified: Secondary | ICD-10-CM | POA: Diagnosis present

## 2017-09-01 DIAGNOSIS — I5022 Chronic systolic (congestive) heart failure: Secondary | ICD-10-CM | POA: Diagnosis present

## 2017-09-01 DIAGNOSIS — K219 Gastro-esophageal reflux disease without esophagitis: Secondary | ICD-10-CM | POA: Diagnosis present

## 2017-09-01 DIAGNOSIS — Z87891 Personal history of nicotine dependence: Secondary | ICD-10-CM

## 2017-09-01 DIAGNOSIS — R0600 Dyspnea, unspecified: Secondary | ICD-10-CM

## 2017-09-01 DIAGNOSIS — K567 Ileus, unspecified: Secondary | ICD-10-CM

## 2017-09-01 DIAGNOSIS — E1159 Type 2 diabetes mellitus with other circulatory complications: Secondary | ICD-10-CM | POA: Diagnosis present

## 2017-09-01 DIAGNOSIS — I10 Essential (primary) hypertension: Secondary | ICD-10-CM | POA: Diagnosis present

## 2017-09-01 DIAGNOSIS — K5720 Diverticulitis of large intestine with perforation and abscess without bleeding: Secondary | ICD-10-CM

## 2017-09-01 DIAGNOSIS — K5792 Diverticulitis of intestine, part unspecified, without perforation or abscess without bleeding: Secondary | ICD-10-CM | POA: Diagnosis present

## 2017-09-01 DIAGNOSIS — E119 Type 2 diabetes mellitus without complications: Secondary | ICD-10-CM

## 2017-09-01 DIAGNOSIS — K5900 Constipation, unspecified: Secondary | ICD-10-CM | POA: Diagnosis present

## 2017-09-01 LAB — CBC
HCT: 40.3 % (ref 39.0–52.0)
Hemoglobin: 13.7 g/dL (ref 13.0–17.0)
MCH: 28.2 pg (ref 26.0–34.0)
MCHC: 34 g/dL (ref 30.0–36.0)
MCV: 83.1 fL (ref 78.0–100.0)
PLATELETS: 451 10*3/uL — AB (ref 150–400)
RBC: 4.85 MIL/uL (ref 4.22–5.81)
RDW: 13 % (ref 11.5–15.5)
WBC: 8.3 10*3/uL (ref 4.0–10.5)

## 2017-09-01 LAB — COMPREHENSIVE METABOLIC PANEL
ALT: 17 U/L (ref 17–63)
AST: 20 U/L (ref 15–41)
Albumin: 3.1 g/dL — ABNORMAL LOW (ref 3.5–5.0)
Alkaline Phosphatase: 48 U/L (ref 38–126)
Anion gap: 11 (ref 5–15)
BUN: 17 mg/dL (ref 6–20)
CHLORIDE: 104 mmol/L (ref 101–111)
CO2: 22 mmol/L (ref 22–32)
CREATININE: 1.14 mg/dL (ref 0.61–1.24)
Calcium: 8.2 mg/dL — ABNORMAL LOW (ref 8.9–10.3)
Glucose, Bld: 136 mg/dL — ABNORMAL HIGH (ref 65–99)
POTASSIUM: 4.1 mmol/L (ref 3.5–5.1)
Sodium: 137 mmol/L (ref 135–145)
TOTAL PROTEIN: 6.4 g/dL — AB (ref 6.5–8.1)
Total Bilirubin: 0.9 mg/dL (ref 0.3–1.2)

## 2017-09-01 LAB — URINALYSIS, ROUTINE W REFLEX MICROSCOPIC
GLUCOSE, UA: NEGATIVE mg/dL
HGB URINE DIPSTICK: NEGATIVE
KETONES UR: NEGATIVE mg/dL
NITRITE: NEGATIVE
PH: 7 (ref 5.0–8.0)
Protein, ur: 30 mg/dL — AB
SPECIFIC GRAVITY, URINE: 1.036 — AB (ref 1.005–1.030)
SQUAMOUS EPITHELIAL / LPF: NONE SEEN

## 2017-09-01 LAB — LIPASE, BLOOD: LIPASE: 21 U/L (ref 11–51)

## 2017-09-01 MED ORDER — FENTANYL CITRATE (PF) 100 MCG/2ML IJ SOLN
100.0000 ug | Freq: Once | INTRAMUSCULAR | Status: AC
  Administered 2017-09-02: 100 ug via INTRAVENOUS
  Filled 2017-09-01: qty 2

## 2017-09-01 MED ORDER — IOPAMIDOL (ISOVUE-300) INJECTION 61%
INTRAVENOUS | Status: AC
  Administered 2017-09-02: 100 mL
  Filled 2017-09-01: qty 100

## 2017-09-01 NOTE — ED Provider Notes (Addendum)
[facility] Provider Note   CSN: [id number] Arrival date & time: 09/01/17  1715     History   Chief Complaint Chief Complaint  Patient presents with  . Abdominal Pain    HPI Kenneth Rios is a 52 y.o. male.  The history is provided by the patient and the spouse.  Abdominal Pain   This is a new problem. The current episode started more than 1 week ago. The problem occurs constantly. The problem has been gradually worsening. The pain is associated with an unknown factor. The pain is located in the LLQ. The quality of the pain is cramping. The pain is at a severity of 10/10. The pain is severe. Associated symptoms include constipation. Pertinent negatives include anorexia and melena. Nothing aggravates the symptoms. Nothing relieves the symptoms. Past workup includes CT scan. His past medical history does not include Crohn's disease.    Past Medical History:  Diagnosis Date  . CHF (congestive heart failure) (HCC)   . Diabetes mellitus without complication (HCC)   . Diverticulitis   . Hypertension     Patient Active Problem List   Diagnosis Date Noted  . Acute diverticulitis 08/29/2017  . Asthma with status asthmaticus   . Diabetes mellitus without complication (HCC) 08/24/2017  . CHF, acute on chronic (HCC) 08/24/2017  . Hypertension 08/24/2017  . Acute viral bronchitis 08/24/2017  . Hypertensive urgency 08/24/2017    History reviewed. No pertinent surgical history.     Home Medications    Prior to Admission medications   Medication Sig Start Date End Date Taking? Authorizing Provider  atorvastatin (LIPITOR) 10 MG tablet Take 10 mg by mouth daily.    [provider]  carvedilol (COREG CR) 10 MG 24 hr capsule Take 10 mg by mouth daily.    [provider]  DiphenhydrAMINE HCl (THERAFLU MULTI SYMPTOM PO) Take 30 mLs by mouth 2 (two) times daily.    [provider]  furosemide (LASIX) 20 MG tablet  Take 20 mg by mouth daily.    [provider]  levofloxacin (LEVAQUIN) 750 MG tablet Take 1 tablet (750 mg total) by mouth daily. 08/31/17   Kenneth Nine, MD  lisinopril (PRINIVIL,ZESTRIL) 10 MG tablet Take 10 mg by mouth daily.    [provider]  metFORMIN (GLUCOPHAGE) 500 MG tablet Take 500 mg by mouth 2 (two) times daily with a meal.    [provider]  metroNIDAZOLE (FLAGYL) 500 MG tablet Take 1 tablet (500 mg total) by mouth every 8 (eight) hours. 08/30/17   Kenneth Nine, MD  oxyCODONE-acetaminophen (PERCOCET) 5-325 MG tablet Take 1 tablet by mouth every 6 (six) hours as needed for severe pain. 08/30/17   Kenneth Nine, MD  sodium-potassium bicarbonate (ALKA-SELTZER GOLD) TBEF dissolvable tablet Take 1 tablet by mouth daily as needed (cold/cough).    [provider]    Family History Family History  Problem Relation Age of Onset  . Sudden Cardiac Death Neg Hx     Social History Social History   Tobacco Use  . Smoking status: Former Games developer  . Smokeless tobacco: Never Used  Substance Use Topics  . Alcohol use: Yes  . Drug use: No     Allergies   Patient has no known allergies.   Review of Systems Review of Systems  Respiratory: Negative for shortness of breath.   Cardiovascular: Negative for chest pain.  Gastrointestinal: Positive for abdominal pain and constipation. Negative for anorexia, blood  in stool and melena.  Genitourinary: Positive for difficulty urinating.  All other systems reviewed and are negative.    Physical Exam Updated Vital Signs BP (!) 146/101 (BP Location: Right Arm)   Pulse 81   Temp 98.7 F (37.1 C) (Oral)   Resp 18   SpO2 100%   Physical Exam  Constitutional: He is oriented to person, place, and time. He appears well-developed and well-nourished. No distress.  HENT:  Head: Normocephalic and atraumatic.  Mouth/Throat: No oropharyngeal exudate.  Eyes: Conjunctivae are normal. Pupils are equal, round,  and reactive to light.  Neck: Normal range of motion. Neck supple.  Cardiovascular: Normal rate, regular rhythm, normal heart sounds and intact distal pulses.  Pulmonary/Chest: Effort normal and breath sounds normal. No stridor. He has no wheezes. He has no rales.  Abdominal: He exhibits distension. He exhibits no mass. Bowel sounds are increased. There is tenderness. There is guarding. There is no rigidity. No hernia.  Is diffusely tender to palpation even with slightest touch, very distended, hyperactive BS throughout  Musculoskeletal: Normal range of motion.  Neurological: He is alert and oriented to person, place, and time.  Skin: Skin is warm and dry. Capillary refill takes less than 2 seconds.     ED Treatments / Results  Labs (all labs ordered are listed, but only abnormal results are displayed) Results for orders placed or performed during the hospital encounter of 09/01/17  Lipase, blood  Result Value Ref Range   Lipase 21 11 - 51 U/L  Comprehensive metabolic panel  Result Value Ref Range   Sodium 137 135 - 145 mmol/L   Potassium 4.1 3.5 - 5.1 mmol/L   Chloride 104 101 - 111 mmol/L   CO2 22 22 - 32 mmol/L   Glucose, Bld 136 (H) 65 - 99 mg/dL   BUN 17 6 - 20 mg/dL   Creatinine, Ser 8.11 0.61 - 1.24 mg/dL   Calcium 8.2 (L) 8.9 - 10.3 mg/dL   Total Protein 6.4 (L) 6.5 - 8.1 g/dL   Albumin 3.1 (L) 3.5 - 5.0 g/dL   AST 20 15 - 41 U/L   ALT 17 17 - 63 U/L   Alkaline Phosphatase 48 38 - 126 U/L   Total Bilirubin 0.9 0.3 - 1.2 mg/dL   GFR calc non Af Amer >60 >60 mL/min   GFR calc Af Amer >60 >60 mL/min   Anion gap 11 5 - 15  CBC  Result Value Ref Range   WBC 8.3 4.0 - 10.5 K/uL   RBC 4.85 4.22 - 5.81 MIL/uL   Hemoglobin 13.7 13.0 - 17.0 g/dL   HCT 91.4 78.2 - 95.6 %   MCV 83.1 78.0 - 100.0 fL   MCH 28.2 26.0 - 34.0 pg   MCHC 34.0 30.0 - 36.0 g/dL   RDW 21.3 08.6 - 57.8 %   Platelets 451 (H) 150 - 400 K/uL  Urinalysis, Routine w reflex microscopic  Result Value Ref  Range   Color, Urine AMBER (A) YELLOW   APPearance CLEAR CLEAR   Specific Gravity, Urine 1.036 (H) 1.005 - 1.030   pH 7.0 5.0 - 8.0   Glucose, UA NEGATIVE NEGATIVE mg/dL   Hgb urine dipstick NEGATIVE NEGATIVE   Bilirubin Urine SMALL (A) NEGATIVE   Ketones, ur NEGATIVE NEGATIVE mg/dL   Protein, ur 30 (A) NEGATIVE mg/dL   Nitrite NEGATIVE NEGATIVE   Leukocytes, UA SMALL (A) NEGATIVE   RBC / HPF 0-5 0 - 5 RBC/hpf   WBC, UA  0-5 0 - 5 WBC/hpf   Bacteria, UA RARE (A) NONE SEEN   Squamous Epithelial / LPF NONE SEEN NONE SEEN   Mucus PRESENT    Dg Chest 2 View  Result Date: 08/24/2017 CLINICAL DATA:  Cough and congestion EXAM: CHEST  2 VIEW COMPARISON:  None. FINDINGS: There is no edema or consolidation. Heart is mildly enlarged with pulmonary vascularity within normal limits. No adenopathy. No bone lesions. IMPRESSION: Mild cardiomegaly.  No edema or consolidation. Electronically Signed   By: Bretta Bang III M.D.   On: 08/24/2017 15:27   Dg Abd 1 View  Result Date: 08/28/2017 CLINICAL DATA:  Left lower quadrant abdominal pain EXAM: ABDOMEN - 1 VIEW COMPARISON:  CT abdomen pelvis 12/11/2013 FINDINGS: The bowel gas pattern is normal. No radio-opaque calculi or other significant radiographic abnormality are seen. IMPRESSION: Negative. Electronically Signed   By: Deatra Robinson M.D.   On: 08/28/2017 01:41   Ct Angio Chest Pe W Or Wo Contrast  Result Date: 08/26/2017 CLINICAL DATA:  Dyspnea for 3-4 weeks.  Elevated D-dimer. EXAM: CT ANGIOGRAPHY CHEST WITH CONTRAST TECHNIQUE: Multidetector CT imaging of the chest was performed using the standard protocol during bolus administration of intravenous contrast. Multiplanar CT image reconstructions and MIPs were obtained to evaluate the vascular anatomy. CONTRAST:  58 cc ISOVUE-370 IOPAMIDOL (ISOVUE-370) INJECTION 76% COMPARISON:  08/24/2017 chest radiograph. FINDINGS: Cardiovascular: The study is low to moderate quality for the evaluation of pulmonary  embolism, limited by slightly suboptimal contrast opacification and by motion artifact. There are no convincing filling defects in the central, lobar, segmental or subsegmental pulmonary artery branches to suggest acute pulmonary embolism. Mildly atherosclerotic nonaneurysmal thoracic aorta. Dilated main pulmonary artery (3.6 cm diameter). Mild cardiomegaly. Left anterior descending and right coronary atherosclerosis. No significant pericardial effusion/thickening. Mediastinum/Nodes: No discrete thyroid nodules. Unremarkable esophagus. No pathologically enlarged axillary, mediastinal or hilar lymph nodes. Lungs/Pleura: No pneumothorax. No pleural effusion. Subsegmental bilateral lower lobe atelectasis. No acute consolidative airspace disease, lung masses or significant pulmonary nodules. Minimal scattered interlobular septal thickening in both lungs. Upper abdomen: No acute abnormality. Musculoskeletal: No aggressive appearing focal osseous lesions. Minimal thoracic spondylosis. Review of the MIP images confirms the above findings. IMPRESSION: 1. Limited scan.  No evidence of pulmonary embolism. 2. Cardiomegaly. 3. Dilated main pulmonary artery, suggesting pulmonary arterial hypertension. 4. Minimal scattered interlobular septal thickening in the lungs, suggesting minimal pulmonary edema. 5. Two-vessel coronary atherosclerosis. Aortic Atherosclerosis (ICD10-I70.0). Electronically Signed   By: Delbert Phenix M.D.   On: 08/26/2017 01:10   Ct Abdomen Pelvis W Contrast  Result Date: 08/28/2017 CLINICAL DATA:  Abdominal pain. EXAM: CT ABDOMEN AND PELVIS WITH CONTRAST TECHNIQUE: Multidetector CT imaging of the abdomen and pelvis was performed using the standard protocol following bolus administration of intravenous contrast. CONTRAST:  100 cc ISOVUE-300 IOPAMIDOL (ISOVUE-300) INJECTION 61% COMPARISON:  Abdominal radiograph 08/28/2017 FINDINGS: Lower chest: Left lower lobe atelectasis versus scarring. Mildly enlarged  heart. Small hiatal hernia. Hepatobiliary: Hepatic steatosis. Normal appearance of the gallbladder. No biliary ductal dilation. Pancreas: Unremarkable. No pancreatic ductal dilatation or surrounding inflammatory changes. Spleen: Normal in size without focal abnormality. Adrenals/Urinary Tract: Adrenal glands are unremarkable. Kidneys are normal, without renal calculi, focal lesion, or hydronephrosis. Bladder is unremarkable. Stomach/Bowel: Stomach is within normal limits. Appendix appears normal. No evidence of small bowel wall thickening, distention, or inflammatory changes. Relatively short segment of asymmetric mucosal thickening of the distal sigmoid colon, image 69/105, sequence, on the background of numerous diverticula. There are associated pericolonic inflammatory  changes and small amount of free fluid. Possible small focus of extraluminal gas is seen medially to the abnormal bowel, image 60/105, sequence 3. Micro rupture cannot be excluded with this appearance. No evidence of frank abscess formation. Scattered diverticular throughout the left colon. Vascular/Lymphatic: Aortic atherosclerosis. No enlarged abdominal or pelvic lymph nodes. Reproductive: Prostate is unremarkable. Other: No abdominal wall hernia or abnormality. No abdominopelvic ascites. Musculoskeletal: No acute or significant osseous findings. IMPRESSION: Relatively short segment of asymmetric mucosal thickening of the distal sigmoid colon, on the background of numerous diverticula, with associated pericolonic inflammatory changes, small amount of free fluid and possible small focus of extraluminal gas. Findings are most suggestive of acute diverticulitis, with possible micro rupture. No evidence of frank abscess formation. Underlying malignancy cannot be entirely excluded, and therefore evaluation with colonoscopy upon resolution of the acute symptoms may be considered. Hepatic steatosis. Small hiatal hernia. Mildly enlarged heart.  Electronically Signed   By: Ted Mcalpineobrinka  Dimitrova M.D.   On: 08/28/2017 12:41    Procedures Procedures (including critical care time)  Medications Ordered in ED  Medications  vancomycin (VANCOCIN) IVPB 1000 mg/200 mL premix (1,000 mg Intravenous New Bag/Given 09/02/17 0121)  ertapenem (INVANZ) 1 g in sodium chloride 0.9 % 50 mL IVPB (not administered)  fentaNYL (SUBLIMAZE) injection 100 mcg (100 mcg Intravenous Given 09/02/17 0021)  iopamidol (ISOVUE-300) 61 % injection (100 mLs  Contrast Given 09/02/17 0004)  morphine 4 MG/ML injection 4 mg (4 mg Intravenous Given 09/02/17 0132)     Final Clinical Impressions(s) / ED Diagnoses   Diverticulitis with abscess, failure of outpatient medication will admit to medicine      Tori Dattilio, MD 09/02/17 [id number]

## 2017-09-01 NOTE — ED Notes (Signed)
Bladder scan resulted 75ml but pt wont allow much pressure to get accurate reading. Will attempt again after pain meds.

## 2017-09-01 NOTE — ED Notes (Signed)
Lab work, radiology results and vital signs reviewed, no critical results at this time, no change in acuity indicated.  

## 2017-09-01 NOTE — ED Triage Notes (Signed)
Pt was here and admitted on 1/16 for CHF. Reports having left side abd pain since being discharged and no bm since then. Having decreased urine output and dark urine. Given fentanyl 100mcg IV pta.

## 2017-09-02 ENCOUNTER — Emergency Department (HOSPITAL_COMMUNITY): Payer: Medicaid Other

## 2017-09-02 DIAGNOSIS — E860 Dehydration: Secondary | ICD-10-CM | POA: Diagnosis present

## 2017-09-02 DIAGNOSIS — D649 Anemia, unspecified: Secondary | ICD-10-CM | POA: Diagnosis present

## 2017-09-02 DIAGNOSIS — K659 Peritonitis, unspecified: Secondary | ICD-10-CM

## 2017-09-02 DIAGNOSIS — K5720 Diverticulitis of large intestine with perforation and abscess without bleeding: Principal | ICD-10-CM

## 2017-09-02 DIAGNOSIS — E119 Type 2 diabetes mellitus without complications: Secondary | ICD-10-CM | POA: Diagnosis present

## 2017-09-02 DIAGNOSIS — K219 Gastro-esophageal reflux disease without esophagitis: Secondary | ICD-10-CM | POA: Diagnosis present

## 2017-09-02 DIAGNOSIS — Z87891 Personal history of nicotine dependence: Secondary | ICD-10-CM | POA: Diagnosis not present

## 2017-09-02 DIAGNOSIS — K567 Ileus, unspecified: Secondary | ICD-10-CM | POA: Diagnosis not present

## 2017-09-02 DIAGNOSIS — E785 Hyperlipidemia, unspecified: Secondary | ICD-10-CM | POA: Diagnosis present

## 2017-09-02 DIAGNOSIS — K5900 Constipation, unspecified: Secondary | ICD-10-CM | POA: Diagnosis present

## 2017-09-02 DIAGNOSIS — I110 Hypertensive heart disease with heart failure: Secondary | ICD-10-CM | POA: Diagnosis present

## 2017-09-02 DIAGNOSIS — I10 Essential (primary) hypertension: Secondary | ICD-10-CM

## 2017-09-02 DIAGNOSIS — I5022 Chronic systolic (congestive) heart failure: Secondary | ICD-10-CM | POA: Diagnosis present

## 2017-09-02 HISTORY — DX: Peritonitis, unspecified: K65.9

## 2017-09-02 LAB — CBG MONITORING, ED
Glucose-Capillary: 157 mg/dL — ABNORMAL HIGH (ref 65–99)
Glucose-Capillary: 93 mg/dL (ref 65–99)

## 2017-09-02 LAB — GLUCOSE, CAPILLARY
GLUCOSE-CAPILLARY: 99 mg/dL (ref 65–99)
GLUCOSE-CAPILLARY: 79 mg/dL (ref 65–99)
Glucose-Capillary: 83 mg/dL (ref 65–99)

## 2017-09-02 MED ORDER — ACETAMINOPHEN 650 MG RE SUPP: 650.0000 mg | Freq: Four times a day (QID) | RECTAL | Status: DC | PRN

## 2017-09-02 MED ORDER — PIPERACILLIN-TAZOBACTAM 3.375 G IVPB 30 MIN
3.3750 g | Freq: Once | INTRAVENOUS | Status: DC
  Filled 2017-09-02: qty 50

## 2017-09-02 MED ORDER — SODIUM CHLORIDE 0.9 % IV SOLN
1.0000 g | INTRAVENOUS | Status: DC
  Administered 2017-09-02 – 2017-09-08 (×7): 1 g via INTRAVENOUS
  Filled 2017-09-02 (×8): qty 1

## 2017-09-02 MED ORDER — INSULIN ASPART 100 UNIT/ML ~~LOC~~ SOLN
0.0000 [IU] | SUBCUTANEOUS | Status: DC
  Administered 2017-09-02: 2 [IU] via SUBCUTANEOUS
  Administered 2017-09-03 – 2017-09-06 (×4): 1 [IU] via SUBCUTANEOUS
  Filled 2017-09-02: qty 1

## 2017-09-02 MED ORDER — MORPHINE SULFATE (PF) 4 MG/ML IV SOLN
4.0000 mg | Freq: Once | INTRAVENOUS | Status: AC
  Administered 2017-09-02: 4 mg via INTRAVENOUS
  Filled 2017-09-02: qty 1

## 2017-09-02 MED ORDER — ONDANSETRON HCL 4 MG PO TABS: 4.0000 mg | ORAL_TABLET | Freq: Four times a day (QID) | ORAL | Status: DC | PRN

## 2017-09-02 MED ORDER — VANCOMYCIN HCL IN DEXTROSE 1-5 GM/200ML-% IV SOLN
1000.0000 mg | Freq: Once | INTRAVENOUS | Status: AC
  Administered 2017-09-02: 1000 mg via INTRAVENOUS
  Filled 2017-09-02: qty 200

## 2017-09-02 MED ORDER — HYDROMORPHONE HCL 1 MG/ML IJ SOLN
1.0000 mg | INTRAMUSCULAR | Status: DC | PRN
  Administered 2017-09-02 – 2017-09-03 (×7): 1 mg via INTRAVENOUS
  Filled 2017-09-02 (×7): qty 1

## 2017-09-02 MED ORDER — SODIUM CHLORIDE 0.9 % IV BOLUS (SEPSIS)
500.0000 mL | Freq: Once | INTRAVENOUS | Status: AC
  Administered 2017-09-02: 500 mL via INTRAVENOUS

## 2017-09-02 MED ORDER — CARVEDILOL PHOSPHATE ER 10 MG PO CP24
10.0000 mg | ORAL_CAPSULE | Freq: Every day | ORAL | Status: DC
  Administered 2017-09-02 – 2017-09-13 (×12): 10 mg via ORAL
  Filled 2017-09-02 (×12): qty 1

## 2017-09-02 MED ORDER — ACETAMINOPHEN 325 MG PO TABS: 650.0000 mg | ORAL_TABLET | Freq: Four times a day (QID) | ORAL | Status: DC | PRN

## 2017-09-02 MED ORDER — SODIUM CHLORIDE 0.9 % IV SOLN
INTRAVENOUS | Status: DC
  Administered 2017-09-02 – 2017-09-07 (×14): via INTRAVENOUS

## 2017-09-02 MED ORDER — ONDANSETRON HCL 4 MG/2ML IJ SOLN: 4.0000 mg | Freq: Four times a day (QID) | INTRAMUSCULAR | Status: DC | PRN

## 2017-09-02 NOTE — ED Notes (Addendum)
Kenneth ReilGardner, MD paged about pt request for pain medicine and family requesting update on when surgical team will arrive at bedside. Awaiting response.

## 2017-09-02 NOTE — ED Notes (Signed)
Surgery at bedside, verbal plan to treat with abx and pain management

## 2017-09-02 NOTE — Progress Notes (Signed)
PROGRESS NOTE    [facility]ios   ZOX:[id number]  DOB: 24-Aug-1965  DOA: 09/01/2017 PCP: Patient, No Pcp Per   Brief Narrative:  Kenneth Rios is a 52 y.o. male with medical history significant of CHF, DM2, HTN, diverticulitis.  Patient was admitted from 1/16 to 1/22.  Initially for CHF exacerbation.  During that admit he developed onset of ABD pain determined on 1/21 to be due to recurrent diverticulitis.  Previously hospitalized for diverticulitis in 2015. Returns to the ED with c/o worsening abd pain.  Severe.  LLQ. Cramping.  Nothing makes it better or worse.   Subjective: Pain across lower abdomen. No vomiting.    Assessment & Plan:   Principal Problem:   Acute diverticulitis with abscess and Peritonitis  - cont Invanz- gen surgery following for need for surgery - NPO - IVF  Active Problems: Dehydration - holding Lasix -cont IVF     Diabetes mellitus without complication  - cont SSI    Hypertension -   Coreg, Lisinopril on hold- BP is high- will resume Coreg     DVT prophylaxis: Lovenox Code Status: Full code Family Communication: mother Disposition Plan:  Consultants:   gen sugery Procedures:    Antimicrobials:  Anti-infectives (From admission, onward)   Start     Dose/Rate Route Frequency Ordered Stop   09/02/17 0200  ertapenem (INVANZ) 1 g in sodium chloride 0.9 % 50 mL IVPB     1 g 100 mL/hr over 30 Minutes Intravenous Every 24 hours 09/02/17 0139     09/02/17 0115  vancomycin (VANCOCIN) IVPB 1000 mg/200 mL premix     1,000 mg 200 mL/hr over 60 Minutes Intravenous  Once 09/02/17 0114 09/02/17 0221   09/02/17 0115  piperacillin-tazobactam (ZOSYN) IVPB 3.375 g  Status:  Discontinued     3.375 g 100 mL/hr over 30 Minutes Intravenous  Once 09/02/17 0114 09/02/17 0139       Objective: Vitals:   09/02/17 0816 09/02/17 0830 09/02/17 0913 09/02/17 1434  BP:  (!) 149/99 (!) 151/95 (!) 151/95  Pulse:  79 75 83  Resp:   16 16  Temp: 98.3 F  (36.8 C)  98.1 F (36.7 C) 98.2 F (36.8 C)  TempSrc: Oral  Oral Oral  SpO2:  96% 99% 97%    Intake/Output Summary (Last 24 hours) at 09/02/2017 1540 Last data filed at 09/02/2017 0356 Gross per 24 hour  Intake 250 ml  Output 250 ml  Net 0 ml   There were no vitals filed for this visit.  Examination: General exam: Appears comfortable  HEENT: PERRLA, oral mucosa moist, no sclera icterus or thrush Respiratory system: Clear to auscultation. Respiratory effort normal. Cardiovascular system: S1 & S2 heard, RRR.  No murmurs  Gastrointestinal system: Abdomen soft,  Tender across lower abdomen, nondistended. Normal bowel sound. No organomegaly Central nervous system: Alert and oriented. No focal neurological deficits. Extremities: No cyanosis, clubbing or edema Skin: No rashes or ulcers Psychiatry:  Mood & affect appropriate.     Data Reviewed: I have personally reviewed following labs and imaging studies  CBC: Recent Labs  Lab 08/30/17 0448 09/01/17 1724  WBC 13.1* 8.3  HGB 14.2 13.7  HCT 41.8 40.3  MCV 83.6 83.1  PLT 444* 451*   Basic Metabolic Panel: Recent Labs  Lab 08/27/17 0854 08/28/17 0818 08/29/17 0415 08/30/17 0448 09/01/17 1724  NA 133* 137 136 135 137  K 4.1 4.1 3.8 3.6 4.1  CL 100* 102 98* 97* 104  CO2 19* 22 24 26 22   GLUCOSE 344* 194* 106* 110* 136*  BUN 31* 38* 38* 35* 17  CREATININE 1.19 1.35* 1.43* 1.24 1.14  CALCIUM 9.0 9.0 8.8* 8.7* 8.2*  MG 2.5* 2.3 2.4  --   --    GFR: Estimated Creatinine Clearance: 87.2 mL/min (by C-G formula based on SCr of 1.14 mg/dL). Liver Function Tests: Recent Labs  Lab 08/27/17 0854 08/28/17 0818 08/29/17 0415 09/01/17 1724  AST 18 17 16 20   ALT 29 24 19 17   ALKPHOS 75 75 66 48  BILITOT 0.7 0.9 1.4* 0.9  PROT 7.5 7.6 7.5 6.4*  ALBUMIN 3.9 3.9 3.7 3.1*   Recent Labs  Lab 09/01/17 1724  LIPASE 21   No results for input(s): AMMONIA in the last 168 hours. Coagulation Profile: No results for input(s):  INR, PROTIME in the last 168 hours. Cardiac Enzymes: No results for input(s): CKTOTAL, CKMB, CKMBINDEX, TROPONINI in the last 168 hours. BNP (last 3 results) No results for input(s): PROBNP in the last 8760 hours. HbA1C: No results for input(s): HGBA1C in the last 72 hours. CBG: Recent Labs  Lab 08/30/17 0956 08/30/17 1155 09/02/17 0404 09/02/17 0802 09/02/17 1129  GLUCAP 184* 127* 157* 93 99   Lipid Profile: No results for input(s): CHOL, HDL, LDLCALC, TRIG, CHOLHDL, LDLDIRECT in the last 72 hours. Thyroid Function Tests: No results for input(s): TSH, T4TOTAL, FREET4, T3FREE, THYROIDAB in the last 72 hours. Anemia Panel: No results for input(s): VITAMINB12, FOLATE, FERRITIN, TIBC, IRON, RETICCTPCT in the last 72 hours. Urine analysis:    Component Value Date/Time   COLORURINE AMBER (A) 09/01/2017 1730   APPEARANCEUR CLEAR 09/01/2017 1730   APPEARANCEUR Clear 12/11/2013 0100   LABSPEC 1.036 (H) 09/01/2017 1730   LABSPEC 1.054 12/11/2013 0100   PHURINE 7.0 09/01/2017 1730   GLUCOSEU NEGATIVE 09/01/2017 1730   GLUCOSEU Negative 12/11/2013 0100   HGBUR NEGATIVE 09/01/2017 1730   BILIRUBINUR SMALL (A) 09/01/2017 1730   BILIRUBINUR Negative 12/11/2013 0100   KETONESUR NEGATIVE 09/01/2017 1730   PROTEINUR 30 (A) 09/01/2017 1730   NITRITE NEGATIVE 09/01/2017 1730   LEUKOCYTESUR SMALL (A) 09/01/2017 1730   LEUKOCYTESUR Negative 12/11/2013 0100   Sepsis Labs: @LABRCNTIP (procalcitonin:4,lacticidven:4) ) Recent Results (from the past 240 hour(s))  Respiratory Panel by PCR     Status: Abnormal   Collection Time: 08/25/17  5:00 AM  Result Value Ref Range Status   Adenovirus NOT DETECTED NOT DETECTED Final   Coronavirus 229E NOT DETECTED NOT DETECTED Final   Coronavirus HKU1 NOT DETECTED NOT DETECTED Final   Coronavirus NL63 NOT DETECTED NOT DETECTED Final   Coronavirus OC43 NOT DETECTED NOT DETECTED Final   Metapneumovirus NOT DETECTED NOT DETECTED Final   Rhinovirus /  Enterovirus DETECTED (A) NOT DETECTED Final   Influenza A NOT DETECTED NOT DETECTED Final   Influenza B NOT DETECTED NOT DETECTED Final   Parainfluenza Virus 1 NOT DETECTED NOT DETECTED Final   Parainfluenza Virus 2 NOT DETECTED NOT DETECTED Final   Parainfluenza Virus 3 NOT DETECTED NOT DETECTED Final   Parainfluenza Virus 4 NOT DETECTED NOT DETECTED Final   Respiratory Syncytial Virus NOT DETECTED NOT DETECTED Final   Bordetella pertussis NOT DETECTED NOT DETECTED Final   Chlamydophila pneumoniae NOT DETECTED NOT DETECTED Final   Mycoplasma pneumoniae NOT DETECTED NOT DETECTED Final         Radiology Studies: Ct Abdomen Pelvis W Contrast  Result Date: 09/02/2017 CLINICAL DATA:  Patient was admitted on 08/24/2017 for congestive heart  failure. Now having left-sided abdominal pain since discharge. No bowel movement since discharge. Decreased urine output with dark urine. EXAM: CT ABDOMEN AND PELVIS WITH CONTRAST TECHNIQUE: Multidetector CT imaging of the abdomen and pelvis was performed using the standard protocol following bolus administration of intravenous contrast. CONTRAST:  ISOVUE-300 IOPAMIDOL (ISOVUE-300) INJECTION 61% COMPARISON:  08/28/2017 FINDINGS: Lower chest: Lung bases are clear. Mild cardiac enlargement. Small esophageal hiatal hernia. Hepatobiliary: No focal liver abnormality is seen. No gallstones, gallbladder wall thickening, or biliary dilatation. Pancreas: Unremarkable. No pancreatic ductal dilatation or surrounding inflammatory changes. Spleen: Normal in size without focal abnormality. Adrenals/Urinary Tract: Adrenal glands are unremarkable. Kidneys are normal, without renal calculi, focal lesion, or hydronephrosis. Bladder is unremarkable. Stomach/Bowel: There is extensive infiltration in the left pelvic fat surrounding the sigmoid colon. Sigmoid colonic wall thickening with multiple diverticula. Changes are consistent with acute diverticulitis and demonstrates  significant progression since previous study. 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 In the area of the probable fistula, there is a small loculated fluid collection measuring 2.5 cm diameter, likely representing early abscess. Stomach and small bowel are mostly decompressed. Colon is not abnormally distended. Appendix is not identified. Vascular/Lymphatic: Aortic atherosclerosis. No enlarged abdominal or pelvic lymph nodes. Reproductive: Prostate is unremarkable. Other: No free air or free fluid in the abdomen. Musculoskeletal: No acute or significant osseous findings. IMPRESSION: 1. Progression of inflammatory changes in the left pelvis consistent with significant progression of acute diverticulitis. 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 No free air. 2. Aortic atherosclerosis. 3. Small esophageal hiatal hernia. Electronically Signed   By: Burman Nieves M.D.   On: 09/02/2017 00:45      Scheduled Meds: . insulin aspart  0-9 Units Subcutaneous Q4H   Continuous Infusions: . sodium chloride 125 mL/hr at 09/02/17 1245  . ertapenem Stopped (09/02/17 0356)     LOS: 0 days    Time spent in minutes: 35    Calvert Cantor, MD [facility] Pager: [email or web address] Password [facility] 09/02/2017, 3:40 PM

## 2017-09-02 NOTE — ED Notes (Signed)
Kriste BasqueBecky, Secretary to page general surgery for time update on consult.

## 2017-09-02 NOTE — H&P (Signed)
History and Physical    CONRADO NANCE ZOX:[id number] DOB: Sep 07, 1965 DOA: 09/01/2017  PCP: Patient, No Pcp Per  Patient coming from: Home  I have personally briefly reviewed patient's old medical records in [facility]  Chief Complaint: Abd pain  HPI: EZRA DENNE is a 52 y.o. male with medical history significant of CHF, DM2, HTN, diverticulitis.  Patient was admitted from 1/16 to 1/22.  Initially for CHF exacerbation.  During that admit he developed onset of ABD pain determined on 1/21 to be due to recurrent diverticulitis.  Previously hospitalized for diverticulitis in 2015.  Today returns to the ED with c/o worsening abd pain.  Severe.  LLQ. Cramping.  Nothing makes it better or worse.    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   Review of Systems: As per HPI otherwise 10 point review of systems negative.   Past Medical History:  Diagnosis Date  . CHF (congestive heart failure) (HCC)   . Diabetes mellitus without complication (HCC)   . Diverticulitis   . Hypertension     History reviewed. No pertinent surgical history.   reports that he has quit smoking. he has never used smokeless tobacco. He reports that he drinks alcohol. He reports that he does not use drugs.  No Known Allergies  Family History  Problem Relation Age of Onset  . Sudden Cardiac Death Neg Hx      Prior to Admission medications   Medication Sig Start Date End Date Taking? Authorizing Provider  atorvastatin (LIPITOR) 10 MG tablet Take 10 mg by mouth daily.   Yes [provider]  carvedilol (COREG CR) 10 MG 24 hr capsule Take 10 mg by mouth daily.   Yes [provider]  DiphenhydrAMINE HCl (THERAFLU MULTI SYMPTOM PO) Take 30 mLs by mouth 2 (two) times daily.   Yes [provider]  furosemide (LASIX) 20 MG tablet Take 20 mg by mouth daily.   Yes [provider]    levofloxacin (LEVAQUIN) 750 MG tablet Take 1 tablet (750 mg total) by mouth daily. 08/31/17  Yes Tyrone Nine, MD  lisinopril (PRINIVIL,ZESTRIL) 10 MG tablet Take 10 mg by mouth daily.   Yes [provider]  metFORMIN (GLUCOPHAGE) 500 MG tablet Take 500 mg by mouth 2 (two) times daily with a meal.   Yes [provider]  metroNIDAZOLE (FLAGYL) 500 MG tablet Take 1 tablet (500 mg total) by mouth every 8 (eight) hours. 08/30/17  Yes Tyrone Nine, MD  oxyCODONE-acetaminophen (PERCOCET) 5-325 MG tablet Take 1 tablet by mouth every 6 (six) hours as needed for severe pain. 08/30/17  Yes Tyrone Nine, MD  sodium-potassium bicarbonate (ALKA-SELTZER GOLD) TBEF dissolvable tablet Take 1 tablet by mouth daily as needed (cold/cough).   Yes [provider]    Physical Exam: Vitals:   09/01/17 2130 09/02/17 0036 09/02/17 0100 09/02/17 0130  BP: (!) 146/101 (!) 143/85 136/81 125/82  Pulse: 81 85 76 75  Resp: 18 18 16 18   Temp:  98.4 F (36.9 C)    TempSrc:  Oral    SpO2: 100% 99% 97% 98%    Constitutional: NAD, calm, comfortable Eyes: PERRL, lids and conjunctivae normal ENMT: Mucous membranes are moist. Posterior pharynx clear of any exudate or lesions.Normal dentition.  Neck: normal, supple, no masses, no thyromegaly Respiratory: clear to auscultation bilaterally, no wheezing, no crackles. Normal respiratory effort. No accessory muscle use.  Cardiovascular: Regular rate  and rhythm, no murmurs / rubs / gallops. No extremity edema. 2+ pedal pulses. No carotid bruits.  Abdomen: LLQ TTP, has positive rebound tenderness elsewhere in abdomen. Musculoskeletal: no clubbing / cyanosis. No joint deformity upper and lower extremities. Good ROM, no contractures. Normal muscle tone.  Skin: no rashes, lesions, ulcers. No induration Neurologic: CN 2-12 grossly intact. Sensation intact, DTR normal. Strength 5/5 in all 4.  Psychiatric: Normal judgment and insight. Alert and oriented x 3.  Normal mood.    Labs on Admission: I have personally reviewed following labs and imaging studies  CBC: Recent Labs  Lab 08/30/17 0448 09/01/17 1724  WBC 13.1* 8.3  HGB 14.2 13.7  HCT 41.8 40.3  MCV 83.6 83.1  PLT 444* 451*   Basic Metabolic Panel: Recent Labs  Lab 08/27/17 0854 08/28/17 0818 08/29/17 0415 08/30/17 0448 09/01/17 1724  NA 133* 137 136 135 137  K 4.1 4.1 3.8 3.6 4.1  CL 100* 102 98* 97* 104  CO2 19* 22 24 26 22   GLUCOSE 344* 194* 106* 110* 136*  BUN 31* 38* 38* 35* 17  CREATININE 1.19 1.35* 1.43* 1.24 1.14  CALCIUM 9.0 9.0 8.8* 8.7* 8.2*  MG 2.5* 2.3 2.4  --   --    GFR: Estimated Creatinine Clearance: 87.2 mL/min (by C-G formula based on SCr of 1.14 mg/dL). Liver Function Tests: Recent Labs  Lab 08/26/17 0613 08/27/17 0854 08/28/17 0818 08/29/17 0415 09/01/17 1724  AST 18 18 17 16 20   ALT 34 29 24 19 17   ALKPHOS 73 75 75 66 48  BILITOT 0.7 0.7 0.9 1.4* 0.9  PROT 7.5 7.5 7.6 7.5 6.4*  ALBUMIN 4.0 3.9 3.9 3.7 3.1*   Recent Labs  Lab 09/01/17 1724  LIPASE 21   No results for input(s): AMMONIA in the last 168 hours. Coagulation Profile: No results for input(s): INR, PROTIME in the last 168 hours. Cardiac Enzymes: No results for input(s): CKTOTAL, CKMB, CKMBINDEX, TROPONINI in the last 168 hours. BNP (last 3 results) No results for input(s): PROBNP in the last 8760 hours. HbA1C: No results for input(s): HGBA1C in the last 72 hours. CBG: Recent Labs  Lab 08/29/17 2355 08/30/17 0504 08/30/17 0724 08/30/17 0956 08/30/17 1155  GLUCAP 128* 119* 84 184* 127*   Lipid Profile: No results for input(s): CHOL, HDL, LDLCALC, TRIG, CHOLHDL, LDLDIRECT in the last 72 hours. Thyroid Function Tests: No results for input(s): TSH, T4TOTAL, FREET4, T3FREE, THYROIDAB in the last 72 hours. Anemia Panel: No results for input(s): VITAMINB12, FOLATE, FERRITIN, TIBC, IRON, RETICCTPCT in the last 72 hours. Urine analysis:    Component Value Date/Time     COLORURINE AMBER (A) 09/01/2017 1730   APPEARANCEUR CLEAR 09/01/2017 1730   APPEARANCEUR Clear 12/11/2013 0100   LABSPEC 1.036 (H) 09/01/2017 1730   LABSPEC 1.054 12/11/2013 0100   PHURINE 7.0 09/01/2017 1730   GLUCOSEU NEGATIVE 09/01/2017 1730   GLUCOSEU Negative 12/11/2013 0100   HGBUR NEGATIVE 09/01/2017 1730   BILIRUBINUR SMALL (A) 09/01/2017 1730   BILIRUBINUR Negative 12/11/2013 0100   KETONESUR NEGATIVE 09/01/2017 1730   PROTEINUR 30 (A) 09/01/2017 1730   NITRITE NEGATIVE 09/01/2017 1730   LEUKOCYTESUR SMALL (A) 09/01/2017 1730   LEUKOCYTESUR Negative 12/11/2013 0100    Radiological Exams on Admission: Ct Abdomen Pelvis W Contrast  Result Date: 09/02/2017 CLINICAL DATA:  Patient was admitted on 08/24/2017 for congestive heart failure. Now having left-sided abdominal pain since discharge. No bowel movement since discharge. Decreased urine output with dark urine. EXAM: CT  ABDOMEN AND PELVIS WITH CONTRAST TECHNIQUE: Multidetector CT imaging of the abdomen and pelvis was performed using the standard protocol following bolus administration of intravenous contrast. CONTRAST:  ISOVUE-300 IOPAMIDOL (ISOVUE-300) INJECTION 61% COMPARISON:  08/28/2017 FINDINGS: Lower chest: Lung bases are clear. Mild cardiac enlargement. Small esophageal hiatal hernia. Hepatobiliary: No focal liver abnormality is seen. No gallstones, gallbladder wall thickening, or biliary dilatation. Pancreas: Unremarkable. No pancreatic ductal dilatation or surrounding inflammatory changes. Spleen: Normal in size without focal abnormality. Adrenals/Urinary Tract: Adrenal glands are unremarkable. Kidneys are normal, without renal calculi, focal lesion, or hydronephrosis. Bladder is unremarkable. Stomach/Bowel: There is extensive infiltration in the left pelvic fat surrounding the sigmoid colon. Sigmoid colonic wall thickening with multiple diverticula. Changes are consistent with acute diverticulitis and demonstrates  significant progression since previous study. 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 In the area of the probable fistula, there is a small loculated fluid collection measuring 2.5 cm diameter, likely representing early abscess. Stomach and small bowel are mostly decompressed. Colon is not abnormally distended. Appendix is not identified. Vascular/Lymphatic: Aortic atherosclerosis. No enlarged abdominal or pelvic lymph nodes. Reproductive: Prostate is unremarkable. Other: No free air or free fluid in the abdomen. Musculoskeletal: No acute or significant osseous findings. IMPRESSION: 1. Progression of inflammatory changes in the left pelvis consistent with significant progression of acute diverticulitis. 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 No free air. 2. Aortic atherosclerosis. 3. Small esophageal hiatal hernia. Electronically Signed   By: Burman Nieves M.D.   On: 09/02/2017 00:45    EKG: Independently reviewed.  Assessment/Plan Principal Problem:   Acute diverticulitis Active Problems:   Diabetes mellitus without complication (HCC)   Hypertension   Peritonitis (HCC)    1. Acute diverticulitis - significant worsening despite 2 days of IV rocephin/flagyl then outpatient levaquin flagyl.  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 1. Spoke with Dr. Corliss Skains 1. NPO 2. Invanz 3. They will see first thing in the AM.  He suspects patient may end up going to surgery today. 2. No SIRS at the moment 3. IVF to prevent dehydration while NPO, watch out for signs of fluid overload though. 2. HTN - 1. holding BP meds due to risk of development of sepsis with acute peritonitis in setting of diverticulitis 2. Use short acting med if needed 3. DM - 1. Hold home PO meds 2. Sensitive SSI Q4H while NPO 4. CHF - 1. Holding  home lasix  DVT prophylaxis: SCDs for possible surgery Code Status: Full Family Communication: Wife at bedside Disposition Plan: Home after admit Consults called: Dr. Corliss Skains with gen surg Admission status: Admit to inpatient - inpatient status as he has failed outpatient treatment of diverticulitis.   Hillary Bow DO [facility] Pager [phone]  If 7AM-7PM, please contact day team taking care of patient [email or web address] Password [email or web address]  09/02/2017, 2:02 AM

## 2017-09-02 NOTE — ED Notes (Signed)
Patient transported to CT scan . 

## 2017-09-02 NOTE — ED Notes (Signed)
Julian ReilGardner MD made aware of pt pain.

## 2017-09-02 NOTE — ED Notes (Addendum)
Pt CBG was 93, notified Wendy(RN)

## 2017-09-02 NOTE — ED Notes (Signed)
Correction: insulin in right posterior arm

## 2017-09-02 NOTE — ED Notes (Signed)
Attempted report 

## 2017-09-02 NOTE — Progress Notes (Signed)
Subjective/Chief Complaint: Pt readmitted with con't pain and likely dehydration   Objective: Vital signs in last 24 hours: Temp:  [98.4 F (36.9 C)-98.7 F (37.1 C)] 98.4 F (36.9 C) (01/25 0036) Pulse Rate:  [75-89] 76 (01/25 0530) Resp:  [14-18] 14 (01/25 0530) BP: (125-151)/(81-101) 151/97 (01/25 0530) SpO2:  [95 %-100 %] 95 % (01/25 0530)    Intake/Output from previous day: 01/24 0701 - 01/25 0700 In: 250 [IV Piggyback:250] Out: 250 [Urine:250] Intake/Output this shift: No intake/output data recorded.  Constitutional: No acute distress, conversant, appears states age. Eyes: Anicteric sclerae, moist conjunctiva, no lid lag Lungs: Clear to auscultation bilaterally, normal respiratory effort CV: regular rate and rhythm, no murmurs, no peripheral edema, pedal pulses 2+ GI: Soft, no masses or hepatosplenomegaly, TTP LLQ, hypoactive BS Skin: No rashes, palpation reveals normal turgor Psychiatric: appropriate judgment and insight, oriented to person, place, and time   Lab Results:  Recent Labs    09/01/17 1724  WBC 8.3  HGB 13.7  HCT 40.3  PLT 451*   BMET Recent Labs    09/01/17 1724  NA 137  K 4.1  CL 104  CO2 22  GLUCOSE 136*  BUN 17  CREATININE 1.14  CALCIUM 8.2*  Studies/Results: Ct Abdomen Pelvis W Contrast  Result Date: 09/02/2017 CLINICAL DATA:  Patient was admitted on 08/24/2017 for congestive heart failure. Now having left-sided abdominal pain since discharge. No bowel movement since discharge. Decreased urine output with dark urine. EXAM: CT ABDOMEN AND PELVIS WITH CONTRAST TECHNIQUE: Multidetector CT imaging of the abdomen and pelvis was performed using the standard protocol following bolus administration of intravenous contrast. CONTRAST:  100mL ISOVUE-300 IOPAMIDOL (ISOVUE-300) INJECTION 61% COMPARISON:  08/28/2017 FINDINGS: Lower chest: Lung bases are clear. Mild cardiac enlargement. Small esophageal hiatal hernia. Hepatobiliary: No focal  liver abnormality is seen. No gallstones, gallbladder wall thickening, or biliary dilatation. Pancreas: Unremarkable. No pancreatic ductal dilatation or surrounding inflammatory changes. Spleen: Normal in size without focal abnormality. Adrenals/Urinary Tract: Adrenal glands are unremarkable. Kidneys are normal, without renal calculi, focal lesion, or hydronephrosis. Bladder is unremarkable. Stomach/Bowel: There is extensive infiltration in the left pelvic fat surrounding the sigmoid colon. Sigmoid colonic wall thickening with multiple diverticula. Changes are consistent with acute diverticulitis and demonstrates significant progression since previous study. 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 In the area of the probable fistula, there is a small loculated fluid collection measuring 2.5 cm diameter, likely representing early abscess. Stomach and small bowel are mostly decompressed. Colon is not abnormally distended. Appendix is not identified. Vascular/Lymphatic: Aortic atherosclerosis. No enlarged abdominal or pelvic lymph nodes. Reproductive: Prostate is unremarkable. Other: No free air or free fluid in the abdomen. Musculoskeletal: No acute or significant osseous findings. IMPRESSION: 1. Progression of inflammatory changes in the left pelvis consistent with significant progression of acute diverticulitis. 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 No free air. 2. Aortic atherosclerosis. 3. Small esophageal hiatal hernia. Electronically Signed   By: Burman NievesWilliam  Stevens M.D.   On: 09/02/2017 00:45    Anti-infectives: Anti-infectives (From admission, onward)   Start     Dose/Rate Route Frequency Ordered Stop   09/02/17 0200  ertapenem (INVANZ) 1 g in sodium chloride 0.9 % 50 mL IVPB     1 g 100 mL/hr over 30 Minutes Intravenous Every 24 hours 09/02/17 0139     09/02/17 0115   vancomycin (VANCOCIN)  IVPB 1000 mg/200 mL premix     1,000 mg 200 mL/hr over 60 Minutes Intravenous  Once 09/02/17 0114 09/02/17 0221   09/02/17 0115  piperacillin-tazobactam (ZOSYN) IVPB 3.375 g  Status:  Discontinued     3.375 g 100 mL/hr over 30 Minutes Intravenous  Once 09/02/17 0114 09/02/17 0139      Assessment/Plan: 52 y/o M with acute diverticulitis Past Medical History:  Diagnosis Date  . CHF (congestive heart failure) (HCC)   . Diabetes mellitus without complication (HCC)   . Diverticulitis   . Hypertension    1.  Agree with invanz 2. Con't NPO, rehydration. 3. I d/w him that if he gets better would potentially avoid surgery, if not may need hartman's colon resection. 4. Will con't to follow closely    LOS: 0 days    Marigene Ehlers., [facility] 09/02/2017

## 2017-09-02 NOTE — Care Management Note (Signed)
Case Management Note  Patient Details  Name: Kenneth Rios MRN: [id number] Date of Birth: Mar 09, 1966  Subjective/Objective:   Pt in with diverticulitis. He is from home with his girlfriend. Pt has no PCP and no insurance. [facility]                  Action/Plan: Plan is for patient to return home when medically stable. CM following for d/c needs, physician orders.   Expected Discharge Date:                  Expected Discharge Plan:  Home/Self Care  In-House Referral:     Discharge planning Services  CM Consult, Medication Assistance, Indigent Health Clinic  Post Acute Care Choice:    Choice offered to:     DME Arranged:    DME Agency:     HH Arranged:    HH Agency:     Status of Service:  In process, will continue to follow  If discussed at Long Length of Stay Meetings, dates discussed:    Additional Comments:  Kermit BaloKelli F Darina Hartwell, RN 09/02/2017, 11:18 AM

## 2017-09-02 NOTE — ED Notes (Signed)
Dr. Julian ReilGardner ( admitting ) explained tests results , plan of care and admission to pt.

## 2017-09-03 LAB — BASIC METABOLIC PANEL
Anion gap: 6 (ref 5–15)
BUN: 10 mg/dL (ref 6–20)
CHLORIDE: 108 mmol/L (ref 101–111)
CO2: 23 mmol/L (ref 22–32)
CREATININE: 0.97 mg/dL (ref 0.61–1.24)
Calcium: 8.1 mg/dL — ABNORMAL LOW (ref 8.9–10.3)
GFR calc Af Amer: >60 mL/min (ref >60)
GFR calc non Af Amer: >60 mL/min (ref >60)
GLUCOSE: 74 mg/dL (ref 65–99)
Potassium: 4.3 mmol/L (ref 3.5–5.1)
Sodium: 137 mmol/L (ref 135–145)

## 2017-09-03 LAB — CBC
HCT: 38.6 % — ABNORMAL LOW (ref 39.0–52.0)
HEMOGLOBIN: 12.9 g/dL — AB (ref 13.0–17.0)
MCH: 27.9 pg (ref 26.0–34.0)
MCHC: 33.4 g/dL (ref 30.0–36.0)
MCV: 83.4 fL (ref 78.0–100.0)
Platelets: 391 10*3/uL (ref 150–400)
RBC: 4.63 MIL/uL (ref 4.22–5.81)
RDW: 12.9 % (ref 11.5–15.5)
WBC: 7.6 10*3/uL (ref 4.0–10.5)

## 2017-09-03 LAB — GLUCOSE, CAPILLARY
GLUCOSE-CAPILLARY: 101 mg/dL — AB (ref 65–99)
GLUCOSE-CAPILLARY: 103 mg/dL — AB (ref 65–99)
GLUCOSE-CAPILLARY: 135 mg/dL — AB (ref 65–99)
GLUCOSE-CAPILLARY: 67 mg/dL (ref 65–99)
GLUCOSE-CAPILLARY: 76 mg/dL (ref 65–99)
GLUCOSE-CAPILLARY: 87 mg/dL (ref 65–99)
GLUCOSE-CAPILLARY: 98 mg/dL (ref 65–99)

## 2017-09-03 MED ORDER — DEXTROSE 50 % IV SOLN
25.0000 mL | Freq: Once | INTRAVENOUS | Status: AC
  Administered 2017-09-03: 25 mL via INTRAVENOUS

## 2017-09-03 MED ORDER — OXYCODONE HCL 5 MG PO TABS
10.0000 mg | ORAL_TABLET | ORAL | Status: DC | PRN
  Administered 2017-09-03 – 2017-09-04 (×3): 10 mg via ORAL
  Filled 2017-09-03 (×3): qty 2

## 2017-09-03 MED ORDER — FAMOTIDINE IN NACL 20-0.9 MG/50ML-% IV SOLN
20.0000 mg | Freq: Two times a day (BID) | INTRAVENOUS | Status: DC
  Administered 2017-09-03 – 2017-09-07 (×8): 20 mg via INTRAVENOUS
  Filled 2017-09-03 (×9): qty 50

## 2017-09-03 MED ORDER — HYDROMORPHONE HCL 1 MG/ML IJ SOLN
1.0000 mg | INTRAMUSCULAR | Status: DC | PRN
  Administered 2017-09-03 – 2017-09-04 (×5): 1 mg via INTRAVENOUS
  Filled 2017-09-03 (×5): qty 1

## 2017-09-03 MED ORDER — KETOROLAC TROMETHAMINE 15 MG/ML IJ SOLN
15.0000 mg | Freq: Four times a day (QID) | INTRAMUSCULAR | Status: AC
  Administered 2017-09-03 – 2017-09-08 (×18): 15 mg via INTRAVENOUS
  Filled 2017-09-03 (×18): qty 1

## 2017-09-03 MED ORDER — DEXTROSE 50 % IV SOLN
INTRAVENOUS | Status: AC
  Filled 2017-09-03: qty 50

## 2017-09-03 NOTE — Plan of Care (Signed)
  Progressing Education: Knowledge of General Education information will improve 09/03/2017 0345 - Progressing by Earnest Rosierfori, Ethanael Veith O, RN Health Behavior/Discharge Planning: Ability to manage health-related needs will improve 09/03/2017 0345 - Progressing by Earnest Rosierfori, Kayhan Boardley O, RN Clinical Measurements: Ability to maintain clinical measurements within normal limits will improve 09/03/2017 0345 - Progressing by Earnest Rosierfori, Norrine Ballester O, RN Will remain free from infection 09/03/2017 0345 - Progressing by Earnest Rosierfori, Towanna Avery O, RN Diagnostic test results will improve 09/03/2017 0345 - Progressing by Earnest Rosierfori, Fatima Fedie O, RN Respiratory complications will improve 09/03/2017 0345 - Progressing by Earnest Rosierfori, Ashvin Adelson O, RN Cardiovascular complication will be avoided 09/03/2017 0345 - Progressing by Earnest Rosierfori, Bess Saltzman O, RN Activity: Risk for activity intolerance will decrease 09/03/2017 0345 - Progressing by Earnest Rosierfori, Guage Efferson O, RN Nutrition: Adequate nutrition will be maintained 09/03/2017 0345 - Progressing by Earnest Rosierfori, Rolondo Pierre O, RN Coping: Level of anxiety will decrease 09/03/2017 0345 - Progressing by Earnest Rosierfori, Heidie Krall O, RN Elimination: Will not experience complications related to bowel motility 09/03/2017 0345 - Progressing by Earnest Rosierfori, Lamis Behrmann O, RN Will not experience complications related to urinary retention 09/03/2017 0345 - Progressing by Earnest Rosierfori, Malai Lady O, RN Pain Managment: General experience of comfort will improve 09/03/2017 0345 - Progressing by Earnest Rosierfori, Dominque Levandowski O, RN Safety: Ability to remain free from injury will improve 09/03/2017 0345 - Progressing by Earnest Rosierfori, Nautia Lem O, RN Skin Integrity: Risk for impaired skin integrity will decrease 09/03/2017 0345 - Progressing by Earnest Rosierfori, Nyjah Schwake O, RN

## 2017-09-03 NOTE — Progress Notes (Signed)
PROGRESS NOTE    Kenneth Rios   ZOX:[id number]  DOB: November 24, 1965  DOA: 09/01/2017 PCP: Patient, No Pcp Per   Brief Narrative:  Kenneth Rios is a 52 y.o. male with medical history significant of CHF, DM2, HTN, diverticulitis.  Patient was admitted from 1/16 to 1/22.  Initially for CHF exacerbation.  During that admit he developed onset of ABD pain determined on 1/21 to be due to recurrent diverticulitis.  Previously hospitalized for diverticulitis in 2015. Returns to the ED with c/o worsening abd pain.  Severe.  LLQ. Cramping.  Nothing makes it better or worse.   Subjective: Severe pain still. Dilaudid is working but wears off too fast. No vomiting.   Assessment & Plan:   Principal Problem:   Acute diverticulitis with abscess and Peritonitis   - cont Invanz- gen surgery following for need for surgery - NPO - IVF -cont Dilaudid- will add Oxycodone and Toradol for better pain control  Active Problems: Dehydration - holding Lasix -cont IVF     Diabetes mellitus without complication  - cont SSI    Hypertension -   Coreg, Lisinopril on hold- BP is high- will resume Coreg  DVT prophylaxis: Lovenox Code Status: Full code Family Communication: mother Disposition Plan:  Consultants:   gen sugery Procedures:    Antimicrobials:  Anti-infectives (From admission, onward)   Start     Dose/Rate Route Frequency Ordered Stop   09/02/17 0200  ertapenem (INVANZ) 1 g in sodium chloride 0.9 % 50 mL IVPB     1 g 100 mL/hr over 30 Minutes Intravenous Every 24 hours 09/02/17 0139     09/02/17 0115  vancomycin (VANCOCIN) IVPB 1000 mg/200 mL premix     1,000 mg 200 mL/hr over 60 Minutes Intravenous  Once 09/02/17 0114 09/02/17 0221   09/02/17 0115  piperacillin-tazobactam (ZOSYN) IVPB 3.375 g  Status:  Discontinued     3.375 g 100 mL/hr over 30 Minutes Intravenous  Once 09/02/17 0114 09/02/17 0139       Objective: Vitals:   09/02/17 2200 09/03/17 0200 09/03/17 0600  09/03/17 1010  BP: (!) 152/91 (!) 163/95 (!) 152/97 (!) 155/84  Pulse: 75 80 84 82  Resp: 18 18 18 18   Temp: 98.4 F (36.9 C) 98.5 F (36.9 C) 98.4 F (36.9 C) (!) 97.5 F (36.4 C)  TempSrc: Oral Oral Oral Oral  SpO2: 99% 99% 99% 98%    Intake/Output Summary (Last 24 hours) at 09/03/2017 1303 Last data filed at 09/03/2017 8119 Gross per 24 hour  Intake 2702.92 ml  Output 1200 ml  Net 1502.92 ml   There were no vitals filed for this visit.  Examination: General exam: Appears comfortable  HEENT: PERRLA, oral mucosa moist, no sclera icterus or thrush Respiratory system: Clear to auscultation. Respiratory effort normal. Cardiovascular system: S1 & S2 heard, RRR.  No murmurs  Gastrointestinal system: Abdomen soft,  Quite tender across lower abdomen, nondistended. Normal bowel sound. No organomegaly Central nervous system: Alert and oriented. No focal neurological deficits. Extremities: No cyanosis, clubbing or edema Skin: No rashes or ulcers Psychiatry:  Mood & affect appropriate.     Data Reviewed: I have personally reviewed following labs and imaging studies  CBC: Recent Labs  Lab 08/30/17 0448 09/01/17 1724 09/03/17 0350  WBC 13.1* 8.3 7.6  HGB 14.2 13.7 12.9*  HCT 41.8 40.3 38.6*  MCV 83.6 83.1 83.4  PLT 444* 451* 391   Basic Metabolic Panel: Recent Labs  Lab 08/28/17 0818 08/29/17 0415  08/30/17 0448 09/01/17 1724 09/03/17 0350  NA 137 136 135 137 137  K 4.1 3.8 3.6 4.1 4.3  CL 102 98* 97* 104 108  CO2 22 24 26 22 23   GLUCOSE 194* 106* 110* 136* 74  BUN 38* 38* 35* 17 10  CREATININE 1.35* 1.43* 1.24 1.14 0.97  CALCIUM 9.0 8.8* 8.7* 8.2* 8.1*  MG 2.3 2.4  --   --   --    GFR: Estimated Creatinine Clearance: 102.5 mL/min (by C-G formula based on SCr of 0.97 mg/dL). Liver Function Tests: Recent Labs  Lab 08/28/17 0818 08/29/17 0415 09/01/17 1724  AST 17 16 20   ALT 24 19 17   ALKPHOS 75 66 48  BILITOT 0.9 1.4* 0.9  PROT 7.6 7.5 6.4*  ALBUMIN 3.9  3.7 3.1*   Recent Labs  Lab 09/01/17 1724  LIPASE 21   No results for input(s): AMMONIA in the last 168 hours. Coagulation Profile: No results for input(s): INR, PROTIME in the last 168 hours. Cardiac Enzymes: No results for input(s): CKTOTAL, CKMB, CKMBINDEX, TROPONINI in the last 168 hours. BNP (last 3 results) No results for input(s): PROBNP in the last 8760 hours. HbA1C: No results for input(s): HGBA1C in the last 72 hours. CBG: Recent Labs  Lab 09/03/17 0039 09/03/17 0350 09/03/17 0443 09/03/17 0753 09/03/17 1121  GLUCAP 135* 67 103* 87 98   Lipid Profile: No results for input(s): CHOL, HDL, LDLCALC, TRIG, CHOLHDL, LDLDIRECT in the last 72 hours. Thyroid Function Tests: No results for input(s): TSH, T4TOTAL, FREET4, T3FREE, THYROIDAB in the last 72 hours. Anemia Panel: No results for input(s): VITAMINB12, FOLATE, FERRITIN, TIBC, IRON, RETICCTPCT in the last 72 hours. Urine analysis:    Component Value Date/Time   COLORURINE AMBER (A) 09/01/2017 1730   APPEARANCEUR CLEAR 09/01/2017 1730   APPEARANCEUR Clear 12/11/2013 0100   LABSPEC 1.036 (H) 09/01/2017 1730   LABSPEC 1.054 12/11/2013 0100   PHURINE 7.0 09/01/2017 1730   GLUCOSEU NEGATIVE 09/01/2017 1730   GLUCOSEU Negative 12/11/2013 0100   HGBUR NEGATIVE 09/01/2017 1730   BILIRUBINUR SMALL (A) 09/01/2017 1730   BILIRUBINUR Negative 12/11/2013 0100   KETONESUR NEGATIVE 09/01/2017 1730   PROTEINUR 30 (A) 09/01/2017 1730   NITRITE NEGATIVE 09/01/2017 1730   LEUKOCYTESUR SMALL (A) 09/01/2017 1730   LEUKOCYTESUR Negative 12/11/2013 0100   Sepsis Labs: @LABRCNTIP (procalcitonin:4,lacticidven:4) ) Recent Results (from the past 240 hour(s))  Respiratory Panel by PCR     Status: Abnormal   Collection Time: 08/25/17  5:00 AM  Result Value Ref Range Status   Adenovirus NOT DETECTED NOT DETECTED Final   Coronavirus 229E NOT DETECTED NOT DETECTED Final   Coronavirus HKU1 NOT DETECTED NOT DETECTED Final    Coronavirus NL63 NOT DETECTED NOT DETECTED Final   Coronavirus OC43 NOT DETECTED NOT DETECTED Final   Metapneumovirus NOT DETECTED NOT DETECTED Final   Rhinovirus / Enterovirus DETECTED (A) NOT DETECTED Final   Influenza A NOT DETECTED NOT DETECTED Final   Influenza B NOT DETECTED NOT DETECTED Final   Parainfluenza Virus 1 NOT DETECTED NOT DETECTED Final   Parainfluenza Virus 2 NOT DETECTED NOT DETECTED Final   Parainfluenza Virus 3 NOT DETECTED NOT DETECTED Final   Parainfluenza Virus 4 NOT DETECTED NOT DETECTED Final   Respiratory Syncytial Virus NOT DETECTED NOT DETECTED Final   Bordetella pertussis NOT DETECTED NOT DETECTED Final   Chlamydophila pneumoniae NOT DETECTED NOT DETECTED Final   Mycoplasma pneumoniae NOT DETECTED NOT DETECTED Final  Radiology Studies: Ct Abdomen Pelvis W Contrast  Result Date: 09/02/2017 CLINICAL DATA:  Patient was admitted on 08/24/2017 for congestive heart failure. Now having left-sided abdominal pain since discharge. No bowel movement since discharge. Decreased urine output with dark urine. EXAM: CT ABDOMEN AND PELVIS WITH CONTRAST TECHNIQUE: Multidetector CT imaging of the abdomen and pelvis was performed using the standard protocol following bolus administration of intravenous contrast. CONTRAST:  ISOVUE-300 IOPAMIDOL (ISOVUE-300) INJECTION 61% COMPARISON:  08/28/2017 FINDINGS: Lower chest: Lung bases are clear. Mild cardiac enlargement. Small esophageal hiatal hernia. Hepatobiliary: No focal liver abnormality is seen. No gallstones, gallbladder wall thickening, or biliary dilatation. Pancreas: Unremarkable. No pancreatic ductal dilatation or surrounding inflammatory changes. Spleen: Normal in size without focal abnormality. Adrenals/Urinary Tract: Adrenal glands are unremarkable. Kidneys are normal, without renal calculi, focal lesion, or hydronephrosis. Bladder is unremarkable. Stomach/Bowel: There is extensive infiltration in the left pelvic  fat surrounding the sigmoid colon. Sigmoid colonic wall thickening with multiple diverticula. Changes are consistent with acute diverticulitis and demonstrates significant progression since previous study. 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 In the area of the probable fistula, there is a small loculated fluid collection measuring 2.5 cm diameter, likely representing early abscess. Stomach and small bowel are mostly decompressed. Colon is not abnormally distended. Appendix is not identified. Vascular/Lymphatic: Aortic atherosclerosis. No enlarged abdominal or pelvic lymph nodes. Reproductive: Prostate is unremarkable. Other: No free air or free fluid in the abdomen. Musculoskeletal: No acute or significant osseous findings. IMPRESSION: 1. Progression of inflammatory changes in the left pelvis consistent with significant progression of acute diverticulitis. 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 No free air. 2. Aortic atherosclerosis. 3. Small esophageal hiatal hernia. Electronically Signed   By: Burman Nieves M.D.   On: 09/02/2017 00:45      Scheduled Meds: . carvedilol  10 mg Oral Daily  . insulin aspart  0-9 Units Subcutaneous Q4H  . ketorolac  15 mg Intravenous Q6H   Continuous Infusions: . sodium chloride 125 mL/hr at 09/03/17 0612  . ertapenem Stopped (09/03/17 0300)  . famotidine (PEPCID) IV 20 mg (09/03/17 1216)     LOS: 1 day    Time spent in minutes: 35    Calvert Cantor, MD [facility] Pager: [email or web address] Password [email or web address] 09/03/2017, 1:03 PM

## 2017-09-03 NOTE — Progress Notes (Signed)
  Progress Note: General Surgery Service   Assessment/Plan: Patient Active Problem List   Diagnosis Date Noted  . Peritonitis (HCC) 09/02/2017  . Acute diverticulitis 08/29/2017  . Asthma with status asthmaticus   . Diabetes mellitus without complication (HCC) 08/24/2017  . CHF, acute on chronic (HCC) 08/24/2017  . Hypertension 08/24/2017  . Acute viral bronchitis 08/24/2017  . Hypertensive urgency 08/24/2017   Acute diverticulitis with 2.5cm abscess, continued pain, normal WBC -continue abx and bowel rest -ok for ice chips -if no clinical progression may require surgery in near future    LOS: 1 day  Chief Complaint/Subjective: Pain near constant in LLQ, pain meds helping but lasting only a short time, no vomiting, lots of pain with voiding  Objective: Vital signs in last 24 hours: Temp:  [98 F (36.7 C)-98.5 F (36.9 C)] 98.4 F (36.9 C) (01/26 0600) Pulse Rate:  [75-88] 84 (01/26 0600) Resp:  [16-18] 18 (01/26 0600) BP: (148-163)/(89-99) 152/97 (01/26 0600) SpO2:  [96 %-99 %] 99 % (01/26 0600) Last BM Date: 08/30/17  Intake/Output from previous day: 01/25 0701 - 01/26 0700 In: 2702.9 [I.V.:2652.9; IV Piggyback:50] Out: 800 [Urine:800] Intake/Output this shift: No intake/output data recorded.  Lungs: CTAB  Cardiovascular: RRR  Abd: soft, TTP LLQ and some in RLQ  Extremities: no edema  Neuro: AOx4  Lab Results: CBC  Recent Labs    09/01/17 1724 09/03/17 0350  WBC 8.3 7.6  HGB 13.7 12.9*  HCT 40.3 38.6*  PLT 451* 391   BMET Recent Labs    09/01/17 1724 09/03/17 0350  NA 137 137  K 4.1 4.3  CL 104 108  CO2 22 23  GLUCOSE 136* 74  BUN 17 10  CREATININE 1.14 0.97  CALCIUM 8.2* 8.1*   PT/INR No results for input(s): LABPROT, INR in the last 72 hours. ABG No results for input(s): PHART, HCO3 in the last 72 hours.  Invalid input(s): PCO2, PO2  Studies/Results:  Anti-infectives: Anti-infectives (From admission, onward)   Start      Dose/Rate Route Frequency Ordered Stop   09/02/17 0200  ertapenem (INVANZ) 1 g in sodium chloride 0.9 % 50 mL IVPB     1 g 100 mL/hr over 30 Minutes Intravenous Every 24 hours 09/02/17 0139     09/02/17 0115  vancomycin (VANCOCIN) IVPB 1000 mg/200 mL premix     1,000 mg 200 mL/hr over 60 Minutes Intravenous  Once 09/02/17 0114 09/02/17 0221   09/02/17 0115  piperacillin-tazobactam (ZOSYN) IVPB 3.375 g  Status:  Discontinued     3.375 g 100 mL/hr over 30 Minutes Intravenous  Once 09/02/17 0114 09/02/17 0139      Medications: Scheduled Meds: . carvedilol  10 mg Oral Daily  . insulin aspart  0-9 Units Subcutaneous Q4H   Continuous Infusions: . sodium chloride 125 mL/hr at 09/03/17 0612  . ertapenem Stopped (09/03/17 0300)   PRN Meds:.acetaminophen **OR** acetaminophen, HYDROmorphone (DILAUDID) injection, ondansetron **OR** ondansetron (ZOFRAN) IV  Rodman PickleLuke Aaron Cereniti Curb, MD Pg# [phone] [facility]

## 2017-09-03 NOTE — Progress Notes (Signed)
Patient transferred to 5W39 at this time. Report given.  Sim BoastHavy, RN

## 2017-09-03 NOTE — Progress Notes (Signed)
Report received from from [facility]. Pt arrived to the unit via bed at 1840.

## 2017-09-04 LAB — GLUCOSE, CAPILLARY
GLUCOSE-CAPILLARY: 141 mg/dL — AB (ref 65–99)
GLUCOSE-CAPILLARY: 80 mg/dL (ref 65–99)
Glucose-Capillary: 101 mg/dL — ABNORMAL HIGH (ref 65–99)
Glucose-Capillary: 75 mg/dL (ref 65–99)
Glucose-Capillary: 89 mg/dL (ref 65–99)
Glucose-Capillary: 97 mg/dL (ref 65–99)

## 2017-09-04 LAB — BASIC METABOLIC PANEL
Anion gap: 7 (ref 5–15)
BUN: 9 mg/dL (ref 6–20)
CALCIUM: 7.8 mg/dL — AB (ref 8.9–10.3)
CHLORIDE: 103 mmol/L (ref 101–111)
CO2: 23 mmol/L (ref 22–32)
CREATININE: 0.88 mg/dL (ref 0.61–1.24)
GFR calc Af Amer: >60 mL/min (ref >60)
GFR calc non Af Amer: >60 mL/min (ref >60)
GLUCOSE: 94 mg/dL (ref 65–99)
Potassium: 4 mmol/L (ref 3.5–5.1)
Sodium: 133 mmol/L — ABNORMAL LOW (ref 135–145)

## 2017-09-04 LAB — CBC
HCT: 36.4 % — ABNORMAL LOW (ref 39.0–52.0)
Hemoglobin: 12.4 g/dL — ABNORMAL LOW (ref 13.0–17.0)
MCH: 27.9 pg (ref 26.0–34.0)
MCHC: 34.1 g/dL (ref 30.0–36.0)
MCV: 82 fL (ref 78.0–100.0)
PLATELETS: 417 10*3/uL — AB (ref 150–400)
RBC: 4.44 MIL/uL (ref 4.22–5.81)
RDW: 12.6 % (ref 11.5–15.5)
WBC: 6.8 10*3/uL (ref 4.0–10.5)

## 2017-09-04 MED ORDER — ACETAMINOPHEN 500 MG PO TABS
1000.0000 mg | ORAL_TABLET | Freq: Three times a day (TID) | ORAL | Status: DC
  Administered 2017-09-04 – 2017-09-13 (×26): 1000 mg via ORAL
  Filled 2017-09-04 (×28): qty 2

## 2017-09-04 MED ORDER — ENOXAPARIN SODIUM 40 MG/0.4ML ~~LOC~~ SOLN
40.0000 mg | SUBCUTANEOUS | Status: DC
  Administered 2017-09-04 – 2017-09-13 (×9): 40 mg via SUBCUTANEOUS
  Filled 2017-09-04 (×9): qty 0.4

## 2017-09-04 MED ORDER — LISINOPRIL 10 MG PO TABS
10.0000 mg | ORAL_TABLET | Freq: Every day | ORAL | Status: DC
  Administered 2017-09-04 – 2017-09-13 (×10): 10 mg via ORAL
  Filled 2017-09-04 (×10): qty 1

## 2017-09-04 MED ORDER — OXYCODONE HCL 5 MG PO TABS
10.0000 mg | ORAL_TABLET | ORAL | Status: DC | PRN
  Administered 2017-09-04 – 2017-09-13 (×17): 10 mg via ORAL
  Filled 2017-09-04 (×17): qty 2

## 2017-09-04 MED ORDER — HYDROMORPHONE HCL 1 MG/ML IJ SOLN
1.0000 mg | INTRAMUSCULAR | Status: DC | PRN
  Administered 2017-09-04 – 2017-09-11 (×27): 1 mg via INTRAVENOUS
  Filled 2017-09-04 (×29): qty 1

## 2017-09-04 MED ORDER — LISINOPRIL 10 MG PO TABS: 10.0000 mg | ORAL_TABLET | Freq: Every day | ORAL | Status: DC

## 2017-09-04 NOTE — Progress Notes (Signed)
    CC: Abdominal pain  Subjective: He complains of ongoing constant abdominal pain.  Sometimes it gets a little bit better but complains of ongoing severe discomfort.  Pain is localized to his left lower quadrant mid lower abdomen. He had Dilaudid 1 mg IV x6 yesterday Toradol 15 mg IV x2 yesterday, 2 doses since 1 AM. Oxycodone 10 mg x2 yesterday    Objective: Vital signs in last 24 hours: Temp:  [97.4 F (36.3 C)-98.3 F (36.8 C)] 97.6 F (36.4 C) (01/27 0443) Pulse Rate:  [69-84] 73 (01/27 0443) Resp:  [17-20] 20 (01/27 0443) BP: (150-165)/(84-108) 150/85 (01/27 0443) SpO2:  [95 %-100 %] 100 % (01/27 0443) Last BM Date: 08/30/17 N.p.o. 3325 IV 750 urine Afebrile blood pressure is somewhat elevated. BMP/CBC are normal. WBC 6.8. CT scan 09/02/17 Intake/Output from previous day: 01/26 0701 - 01/27 0700 In: 3325 [I.V.:3125; IV Piggyback:200] Out: 1750 [Urine:1750] Intake/Output this shift: No intake/output data recorded.  General appearance: alert, cooperative and no distress Resp: clear to auscultation bilaterally GI: Ongoing pain and tenderness left lower quadrant mid lower abdomen.  Lab Results:  Recent Labs    09/03/17 0350 09/04/17 0255  WBC 7.6 6.8  HGB 12.9* 12.4*  HCT 38.6* 36.4*  PLT 391 417*    BMET Recent Labs    09/03/17 0350 09/04/17 0255  NA 137 133*  K 4.3 4.0  CL 108 103  CO2 23 23  GLUCOSE 74 94  BUN 10 9  CREATININE 0.97 0.88  CALCIUM 8.1* 7.8*   PT/INR No results for input(s): LABPROT, INR in the last 72 hours.  Recent Labs  Lab 08/29/17 0415 09/01/17 1724  AST 16 20  ALT 19 17  ALKPHOS 66 48  BILITOT 1.4* 0.9  PROT 7.5 6.4*  ALBUMIN 3.7 3.1*     Lipase     Component Value Date/Time   LIPASE 21 09/01/2017 1724   LIPASE 135 12/10/2013 2156     Medications: . carvedilol  10 mg Oral Daily  . insulin aspart  0-9 Units Subcutaneous Q4H  . ketorolac  15 mg Intravenous Q6H   . sodium chloride 125 mL/hr at  09/04/17 0629  . ertapenem Stopped (09/04/17 0302)  . famotidine (PEPCID) IV 20 mg (09/04/17 0925)   Anti-infectives (From admission, onward)   Start     Dose/Rate Route Frequency Ordered Stop   09/02/17 0200  ertapenem (INVANZ) 1 g in sodium chloride 0.9 % 50 mL IVPB     1 g 100 mL/hr over 30 Minutes Intravenous Every 24 hours 09/02/17 0139     09/02/17 0115  vancomycin (VANCOCIN) IVPB 1000 mg/200 mL premix     1,000 mg 200 mL/hr over 60 Minutes Intravenous  Once 09/02/17 0114 09/02/17 0221   09/02/17 0115  piperacillin-tazobactam (ZOSYN) IVPB 3.375 g  Status:  Discontinued     3.375 g 100 mL/hr over 30 Minutes Intravenous  Once 09/02/17 0114 09/02/17 0139      Assessment/Plan Acute diverticulitis with 2.5 cm abscess  Dehydration Diabetes Hypertension  FEN: IV fluids/n.p.o. ID: 1/16 - 1/25 Cipro/Flagyl Invanz 09/02/17 =>> day 3 DVT: SCDs only - add Lovenox Foley: None Follow-up:   to be determined  Plan: Continue antibiotics.  Work on pain control.  Repeat CT later this coming week.  Despite ongoing pain he remains afebrile with a normal white count.    LOS: 2 days    Rusty Glodowski 09/04/2017 [phone]

## 2017-09-04 NOTE — Progress Notes (Signed)
PROGRESS NOTE    Kenneth Cobblenthony L Trainer   KGM:[id number]  DOB: 09/15/1965  DOA: 09/01/2017 PCP: Patient, No Pcp Per   Brief Narrative:  Kenneth Rios is a 52 y.o. male with medical history significant of CHF, DM2, HTN, diverticulitis.  Patient was admitted from 1/16 to 1/22.  Initially for CHF exacerbation.  During that admit he developed onset of ABD pain determined on 1/21 to be due to recurrent diverticulitis.  Previously hospitalized for diverticulitis in 2015. Returns to the ED with c/o worsening abd pain.  Severe.  LLQ. Cramping.  Nothing makes it better or worse.   Subjective: Ongoing severe pain.    Assessment & Plan:   Principal Problem:   Acute diverticulitis with abscess and Peritonitis   - cont Invanz- gen surgery following for need for surgery - NPO - IVF -cont Dilaudid-  added Oxycodone and Toradol for better pain control yesterday but he is barely utilizing the Oxycodone- I have advised him to use it more frequently.   Active Problems: Dehydration - holding Lasix -cont IVF     Diabetes mellitus without complication  - cont SSI    Hypertension -    cont Coreg and Lisinopril  DVT prophylaxis: Lovenox Code Status: Full code Family Communication: mother Disposition Plan:  Consultants:   gen sugery Procedures:    Antimicrobials:  Anti-infectives (From admission, onward)   Start     Dose/Rate Route Frequency Ordered Stop   09/02/17 0200  ertapenem (INVANZ) 1 g in sodium chloride 0.9 % 50 mL IVPB     1 g 100 mL/hr over 30 Minutes Intravenous Every 24 hours 09/02/17 0139     09/02/17 0115  vancomycin (VANCOCIN) IVPB 1000 mg/200 mL premix     1,000 mg 200 mL/hr over 60 Minutes Intravenous  Once 09/02/17 0114 09/02/17 0221   09/02/17 0115  piperacillin-tazobactam (ZOSYN) IVPB 3.375 g  Status:  Discontinued     3.375 g 100 mL/hr over 30 Minutes Intravenous  Once 09/02/17 0114 09/02/17 0139       Objective: Vitals:   09/03/17 1946 09/03/17 2002  09/03/17 2005 09/04/17 0443  BP:  (!) 161/92 (!) 161/92 (!) 150/85  Pulse:  75 75 73  Resp:  17 17 20   Temp:  (!) 97.4 F (36.3 C) (!) 97.4 F (36.3 C) 97.6 F (36.4 C)  TempSrc:   Oral   SpO2:  95% 95% 100%  Height: 5\' 9"  (1.753 m)       Intake/Output Summary (Last 24 hours) at 09/04/2017 1324 Last data filed at 09/04/2017 1216 Gross per 24 hour  Intake 2150 ml  Output 1500 ml  Net 650 ml   There were no vitals filed for this visit.  Examination: General exam: Appears comfortable  HEENT: PERRLA, oral mucosa moist, no sclera icterus or thrush Respiratory system: Clear to auscultation. Respiratory effort normal. Cardiovascular system: S1 & S2 heard, RRR.  No murmurs  Gastrointestinal system: Abdomen soft,  Quite tender across lower abdomen, nondistended. Normal bowel sound. No organomegaly Central nervous system: Alert and oriented. No focal neurological deficits. Extremities: No cyanosis, clubbing or edema Skin: No rashes or ulcers Psychiatry:  Mood & affect appropriate.     Data Reviewed: I have personally reviewed following labs and imaging studies  CBC: Recent Labs  Lab 08/30/17 0448 09/01/17 1724 09/03/17 0350 09/04/17 0255  WBC 13.1* 8.3 7.6 6.8  HGB 14.2 13.7 12.9* 12.4*  HCT 41.8 40.3 38.6* 36.4*  MCV 83.6 83.1 83.4 82.0  PLT  444* 451* 391 417*   Basic Metabolic Panel: Recent Labs  Lab 08/29/17 0415 08/30/17 0448 09/01/17 1724 09/03/17 0350 09/04/17 0255  NA 136 135 137 137 133*  K 3.8 3.6 4.1 4.3 4.0  CL 98* 97* 104 108 103  CO2 24 26 22 23 23   GLUCOSE 106* 110* 136* 74 94  BUN 38* 35* 17 10 9   CREATININE 1.43* 1.24 1.14 0.97 0.88  CALCIUM 8.8* 8.7* 8.2* 8.1* 7.8*  MG 2.4  --   --   --   --    GFR: Estimated Creatinine Clearance: 112.9 mL/min (by C-G formula based on SCr of 0.88 mg/dL). Liver Function Tests: Recent Labs  Lab 08/29/17 0415 09/01/17 1724  AST 16 20  ALT 19 17  ALKPHOS 66 48  BILITOT 1.4* 0.9  PROT 7.5 6.4*  ALBUMIN  3.7 3.1*   Recent Labs  Lab 09/01/17 1724  LIPASE 21   No results for input(s): AMMONIA in the last 168 hours. Coagulation Profile: No results for input(s): INR, PROTIME in the last 168 hours. Cardiac Enzymes: No results for input(s): CKTOTAL, CKMB, CKMBINDEX, TROPONINI in the last 168 hours. BNP (last 3 results) No results for input(s): PROBNP in the last 8760 hours. HbA1C: No results for input(s): HGBA1C in the last 72 hours. CBG: Recent Labs  Lab 09/03/17 2009 09/04/17 0029 09/04/17 0440 09/04/17 0821 09/04/17 1220  GLUCAP 101* 101* 89 75 141*   Lipid Profile: No results for input(s): CHOL, HDL, LDLCALC, TRIG, CHOLHDL, LDLDIRECT in the last 72 hours. Thyroid Function Tests: No results for input(s): TSH, T4TOTAL, FREET4, T3FREE, THYROIDAB in the last 72 hours. Anemia Panel: No results for input(s): VITAMINB12, FOLATE, FERRITIN, TIBC, IRON, RETICCTPCT in the last 72 hours. Urine analysis:    Component Value Date/Time   COLORURINE AMBER (A) 09/01/2017 1730   APPEARANCEUR CLEAR 09/01/2017 1730   APPEARANCEUR Clear 12/11/2013 0100   LABSPEC 1.036 (H) 09/01/2017 1730   LABSPEC 1.054 12/11/2013 0100   PHURINE 7.0 09/01/2017 1730   GLUCOSEU NEGATIVE 09/01/2017 1730   GLUCOSEU Negative 12/11/2013 0100   HGBUR NEGATIVE 09/01/2017 1730   BILIRUBINUR SMALL (A) 09/01/2017 1730   BILIRUBINUR Negative 12/11/2013 0100   KETONESUR NEGATIVE 09/01/2017 1730   PROTEINUR 30 (A) 09/01/2017 1730   NITRITE NEGATIVE 09/01/2017 1730   LEUKOCYTESUR SMALL (A) 09/01/2017 1730   LEUKOCYTESUR Negative 12/11/2013 0100   Sepsis Labs: @LABRCNTIP (procalcitonin:4,lacticidven:4) ) No results found for this or any previous visit (from the past 240 hour(s)).       Radiology Studies: No results found.    Scheduled Meds: . acetaminophen  1,000 mg Oral Q8H  . carvedilol  10 mg Oral Daily  . enoxaparin (LOVENOX) injection  40 mg Subcutaneous Q24H  . insulin aspart  0-9 Units  Subcutaneous Q4H  . ketorolac  15 mg Intravenous Q6H   Continuous Infusions: . sodium chloride 125 mL/hr at 09/04/17 0629  . ertapenem Stopped (09/04/17 0302)  . famotidine (PEPCID) IV Stopped (09/04/17 1233)     LOS: 2 days    Time spent in minutes: 35    Calvert Cantor, MD [facility] Pager: [email or web address] Password [email or web address] 09/04/2017, 1:24 PM

## 2017-09-05 ENCOUNTER — Inpatient Hospital Stay (HOSPITAL_COMMUNITY): Payer: Medicaid Other | Admitting: Anesthesiology

## 2017-09-05 ENCOUNTER — Encounter (HOSPITAL_COMMUNITY)
Admission: EM | Disposition: A | Discharge status: Home Health | Payer: Self-pay | Source: Home / Self Care | Attending: Internal Medicine

## 2017-09-05 HISTORY — PX: COLON RESECTION: SHX5231

## 2017-09-05 LAB — BASIC METABOLIC PANEL
Anion gap: 8 (ref 5–15)
BUN: 6 mg/dL (ref 6–20)
CALCIUM: 7.7 mg/dL — AB (ref 8.9–10.3)
CO2: 22 mmol/L (ref 22–32)
CREATININE: 0.9 mg/dL (ref 0.61–1.24)
Chloride: 104 mmol/L (ref 101–111)
GFR calc non Af Amer: >60 mL/min (ref >60)
Glucose, Bld: 99 mg/dL (ref 65–99)
Potassium: 4 mmol/L (ref 3.5–5.1)
SODIUM: 134 mmol/L — AB (ref 135–145)

## 2017-09-05 LAB — GLUCOSE, CAPILLARY
GLUCOSE-CAPILLARY: 100 mg/dL — AB (ref 65–99)
GLUCOSE-CAPILLARY: 73 mg/dL (ref 65–99)
GLUCOSE-CAPILLARY: 86 mg/dL (ref 65–99)
GLUCOSE-CAPILLARY: 89 mg/dL (ref 65–99)
Glucose-Capillary: 78 mg/dL (ref 65–99)
Glucose-Capillary: 102 mg/dL — ABNORMAL HIGH (ref 65–99)
Glucose-Capillary: 133 mg/dL — ABNORMAL HIGH (ref 65–99)

## 2017-09-05 LAB — CBC
HCT: 34.8 % — ABNORMAL LOW (ref 39.0–52.0)
Hemoglobin: 12 g/dL — ABNORMAL LOW (ref 13.0–17.0)
MCH: 27.7 pg (ref 26.0–34.0)
MCHC: 34.5 g/dL (ref 30.0–36.0)
MCV: 80.4 fL (ref 78.0–100.0)
Platelets: 415 10*3/uL — ABNORMAL HIGH (ref 150–400)
RBC: 4.33 MIL/uL (ref 4.22–5.81)
RDW: 12.1 % (ref 11.5–15.5)
WBC: 6.8 10*3/uL (ref 4.0–10.5)

## 2017-09-05 LAB — MRSA PCR SCREENING: MRSA by PCR: NEGATIVE

## 2017-09-05 SURGERY — COLON RESECTION
Anesthesia: General | Site: Abdomen

## 2017-09-05 MED ORDER — ROCURONIUM BROMIDE 10 MG/ML (PF) SYRINGE
PREFILLED_SYRINGE | INTRAVENOUS | Status: AC
  Filled 2017-09-05: qty 5

## 2017-09-05 MED ORDER — KETAMINE HCL 10 MG/ML IJ SOLN
INTRAMUSCULAR | Status: DC | PRN
  Administered 2017-09-05: 20 mg via INTRAVENOUS
  Administered 2017-09-05: 40 mg via INTRAVENOUS
  Administered 2017-09-05: 20 mg via INTRAVENOUS

## 2017-09-05 MED ORDER — SUGAMMADEX SODIUM 200 MG/2ML IV SOLN
INTRAVENOUS | Status: DC | PRN
  Administered 2017-09-05: 200 mg via INTRAVENOUS

## 2017-09-05 MED ORDER — LACTATED RINGERS IV SOLN
INTRAVENOUS | Status: DC
  Administered 2017-09-05 (×2): via INTRAVENOUS

## 2017-09-05 MED ORDER — LIDOCAINE IN D5W 4-5 MG/ML-% IV SOLN
INTRAVENOUS | Status: DC | PRN
  Administered 2017-09-05: 2 mg/min via INTRAVENOUS

## 2017-09-05 MED ORDER — HYDRALAZINE HCL 20 MG/ML IJ SOLN
5.0000 mg | Freq: Once | INTRAMUSCULAR | Status: AC
  Administered 2017-09-05: 5 mg via INTRAVENOUS

## 2017-09-05 MED ORDER — ONDANSETRON HCL 4 MG/2ML IJ SOLN
INTRAMUSCULAR | Status: AC
  Filled 2017-09-05: qty 2

## 2017-09-05 MED ORDER — DEXAMETHASONE SODIUM PHOSPHATE 10 MG/ML IJ SOLN
INTRAMUSCULAR | Status: AC
  Filled 2017-09-05: qty 1

## 2017-09-05 MED ORDER — PROPOFOL 10 MG/ML IV BOLUS
INTRAVENOUS | Status: AC
  Filled 2017-09-05: qty 20

## 2017-09-05 MED ORDER — ROCURONIUM BROMIDE 100 MG/10ML IV SOLN
INTRAVENOUS | Status: DC | PRN
  Administered 2017-09-05: 70 mg via INTRAVENOUS
  Administered 2017-09-05: 10 mg via INTRAVENOUS

## 2017-09-05 MED ORDER — 0.9 % SODIUM CHLORIDE (POUR BTL) OPTIME
TOPICAL | Status: DC | PRN
  Administered 2017-09-05 (×3): 1000 mL

## 2017-09-05 MED ORDER — HYDRALAZINE HCL 20 MG/ML IJ SOLN
INTRAMUSCULAR | Status: AC
  Administered 2017-09-05: 5 mg via INTRAVENOUS
  Filled 2017-09-05: qty 1

## 2017-09-05 MED ORDER — GABAPENTIN 300 MG PO CAPS
300.0000 mg | ORAL_CAPSULE | ORAL | Status: AC
  Administered 2017-09-05: 300 mg via ORAL
  Filled 2017-09-05: qty 1

## 2017-09-05 MED ORDER — LABETALOL HCL 5 MG/ML IV SOLN
INTRAVENOUS | Status: DC | PRN
  Administered 2017-09-05: 10 mg via INTRAVENOUS

## 2017-09-05 MED ORDER — FENTANYL CITRATE (PF) 250 MCG/5ML IJ SOLN
INTRAMUSCULAR | Status: AC
  Filled 2017-09-05: qty 5

## 2017-09-05 MED ORDER — HYDROMORPHONE HCL 1 MG/ML IJ SOLN
0.2500 mg | INTRAMUSCULAR | Status: DC | PRN
  Administered 2017-09-05 (×4): 0.5 mg via INTRAVENOUS

## 2017-09-05 MED ORDER — ONDANSETRON HCL 4 MG/2ML IJ SOLN
INTRAMUSCULAR | Status: DC | PRN
  Administered 2017-09-05: 4 mg via INTRAVENOUS

## 2017-09-05 MED ORDER — LIDOCAINE HCL (CARDIAC) 20 MG/ML IV SOLN
INTRAVENOUS | Status: DC | PRN
  Administered 2017-09-05: 60 mg via INTRAVENOUS

## 2017-09-05 MED ORDER — KETAMINE HCL-SODIUM CHLORIDE 100-0.9 MG/10ML-% IV SOSY
PREFILLED_SYRINGE | INTRAVENOUS | Status: AC
  Filled 2017-09-05: qty 10

## 2017-09-05 MED ORDER — DEXAMETHASONE SODIUM PHOSPHATE 10 MG/ML IJ SOLN
INTRAMUSCULAR | Status: DC | PRN
  Administered 2017-09-05: 10 mg via INTRAVENOUS

## 2017-09-05 MED ORDER — MIDAZOLAM HCL 5 MG/5ML IJ SOLN
INTRAMUSCULAR | Status: DC | PRN
  Administered 2017-09-05: 2 mg via INTRAVENOUS

## 2017-09-05 MED ORDER — DEXMEDETOMIDINE HCL IN NACL 200 MCG/50ML IV SOLN
INTRAVENOUS | Status: DC | PRN
  Administered 2017-09-05 (×4): 8 ug via INTRAVENOUS

## 2017-09-05 MED ORDER — HYDROMORPHONE HCL 1 MG/ML IJ SOLN
INTRAMUSCULAR | Status: AC
  Administered 2017-09-05: 0.5 mg via INTRAVENOUS
  Filled 2017-09-05: qty 1

## 2017-09-05 MED ORDER — PROMETHAZINE HCL 25 MG/ML IJ SOLN
6.2500 mg | INTRAMUSCULAR | Status: DC | PRN
  Administered 2017-09-05: 12.5 mg via INTRAVENOUS

## 2017-09-05 MED ORDER — EPHEDRINE 5 MG/ML INJ
INTRAVENOUS | Status: AC
Start: 2017-09-05 — End: 2017-09-05
  Filled 2017-09-05: qty 10

## 2017-09-05 MED ORDER — PROMETHAZINE HCL 25 MG/ML IJ SOLN
INTRAMUSCULAR | Status: AC
  Administered 2017-09-05: 12.5 mg via INTRAVENOUS
  Filled 2017-09-05: qty 1

## 2017-09-05 MED ORDER — PROPOFOL 10 MG/ML IV BOLUS
INTRAVENOUS | Status: DC | PRN
  Administered 2017-09-05: 150 mg via INTRAVENOUS

## 2017-09-05 MED ORDER — SUGAMMADEX SODIUM 200 MG/2ML IV SOLN
INTRAVENOUS | Status: AC
Start: 2017-09-05 — End: 2017-09-05
  Filled 2017-09-05: qty 2

## 2017-09-05 MED ORDER — MIDAZOLAM HCL 2 MG/2ML IJ SOLN
INTRAMUSCULAR | Status: AC
  Filled 2017-09-05: qty 2

## 2017-09-05 MED ORDER — LIDOCAINE 2% (20 MG/ML) 5 ML SYRINGE
INTRAMUSCULAR | Status: AC
  Filled 2017-09-05: qty 5

## 2017-09-05 MED ORDER — FENTANYL CITRATE (PF) 100 MCG/2ML IJ SOLN
INTRAMUSCULAR | Status: DC | PRN
  Administered 2017-09-05: 100 ug via INTRAVENOUS
  Administered 2017-09-05: 50 ug via INTRAVENOUS
  Administered 2017-09-05: 100 ug via INTRAVENOUS

## 2017-09-05 SURGICAL SUPPLY — 56 items
BLADE CLIPPER SURG (BLADE) IMPLANT
CANISTER SUCT 3000ML PPV (MISCELLANEOUS) ×3 IMPLANT
CHLORAPREP W/TINT 26ML (MISCELLANEOUS) ×3 IMPLANT
COVER MAYO STAND STRL (DRAPES) IMPLANT
COVER SURGICAL LIGHT HANDLE (MISCELLANEOUS) ×6 IMPLANT
DRAPE HALF SHEET 40X57 (DRAPES) ×3 IMPLANT
DRAPE LAPAROSCOPIC ABDOMINAL (DRAPES) ×3 IMPLANT
DRAPE UTILITY XL STRL (DRAPES) IMPLANT
DRAPE WARM FLUID 44X44 (DRAPE) ×3 IMPLANT
DRSG OPSITE POSTOP 4X10 (GAUZE/BANDAGES/DRESSINGS) IMPLANT
DRSG OPSITE POSTOP 4X8 (GAUZE/BANDAGES/DRESSINGS) IMPLANT
DRSG VAC ATS MED SENSATRAC (GAUZE/BANDAGES/DRESSINGS) ×3 IMPLANT
ELECT BLADE 6.5 EXT (BLADE) ×3 IMPLANT
ELECT CAUTERY BLADE 6.4 (BLADE) ×3 IMPLANT
ELECT REM PT RETURN 9FT ADLT (ELECTROSURGICAL) ×3
ELECTRODE REM PT RTRN 9FT ADLT (ELECTROSURGICAL) ×1 IMPLANT
GLOVE BIOGEL PI IND STRL 7.5 (GLOVE) ×2 IMPLANT
GLOVE BIOGEL PI INDICATOR 7.5 (GLOVE) ×4
GLOVE SURG SS PI 7.0 STRL IVOR (GLOVE) ×6 IMPLANT
GOWN STRL REUS W/ TWL LRG LVL3 (GOWN DISPOSABLE) ×6 IMPLANT
GOWN STRL REUS W/TWL LRG LVL3 (GOWN DISPOSABLE) ×12
KIT BASIN OR (CUSTOM PROCEDURE TRAY) ×3 IMPLANT
KIT OSTOMY DRAINABLE 2.75 STR (WOUND CARE) ×3 IMPLANT
KIT ROOM TURNOVER OR (KITS) ×3 IMPLANT
LEGGING LITHOTOMY PAIR STRL (DRAPES) IMPLANT
LIGASURE IMPACT 36 18CM CVD LR (INSTRUMENTS) ×3 IMPLANT
NS IRRIG 1000ML POUR BTL (IV SOLUTION) ×6 IMPLANT
PACK GENERAL/GYN (CUSTOM PROCEDURE TRAY) ×3 IMPLANT
PAD ARMBOARD 7.5X6 YLW CONV (MISCELLANEOUS) ×3 IMPLANT
PENCIL BUTTON HOLSTER BLD 10FT (ELECTRODE) ×3 IMPLANT
SPONGE LAP 18X18 X RAY DECT (DISPOSABLE) IMPLANT
STAPLER CUT CVD 40MM BLUE (STAPLE) ×3 IMPLANT
STAPLER CUT RELOAD BLUE (STAPLE) ×3 IMPLANT
STAPLER VISISTAT 35W (STAPLE) ×3 IMPLANT
SUCTION POOLE TIP (SUCTIONS) ×3 IMPLANT
SURGILUBE 2OZ TUBE FLIPTOP (MISCELLANEOUS) IMPLANT
SUT PDS AB 0 CT 36 (SUTURE) ×6 IMPLANT
SUT PDS AB 1 TP1 96 (SUTURE) ×6 IMPLANT
SUT PROLENE 0 CT 1 30 (SUTURE) ×6 IMPLANT
SUT PROLENE 2 0 CT2 30 (SUTURE) IMPLANT
SUT PROLENE 2 0 KS (SUTURE) IMPLANT
SUT SILK 2 0 SH CR/8 (SUTURE) ×3 IMPLANT
SUT SILK 2 0 TIES 10X30 (SUTURE) ×3 IMPLANT
SUT SILK 3 0 SH CR/8 (SUTURE) ×3 IMPLANT
SUT SILK 3 0 TIES 10X30 (SUTURE) ×3 IMPLANT
SUT VIC AB 0 CT1 27 (SUTURE) ×4
SUT VIC AB 0 CT1 27XBRD ANBCTR (SUTURE) ×2 IMPLANT
SUT VIC AB 3-0 SH 18 (SUTURE) IMPLANT
SYR BULB IRRIGATION 50ML (SYRINGE) ×3 IMPLANT
TOWEL OR 17X26 10 PK STRL BLUE (TOWEL DISPOSABLE) ×6 IMPLANT
TRAY FOLEY W/METER SILVER 16FR (SET/KITS/TRAYS/PACK) IMPLANT
TRAY PROCTOSCOPIC FIBER OPTIC (SET/KITS/TRAYS/PACK) IMPLANT
TUBE CONNECTING 12'X1/4 (SUCTIONS) ×1
TUBE CONNECTING 12X1/4 (SUCTIONS) ×2 IMPLANT
WND VAC CANISTER 500ML (MISCELLANEOUS) ×3 IMPLANT
YANKAUER SUCT BULB TIP NO VENT (SUCTIONS) ×3 IMPLANT

## 2017-09-05 NOTE — Progress Notes (Signed)
  Progress Note: General Surgery Service   Assessment/Plan: Patient Active Problem List   Diagnosis Date Noted  . Peritonitis (HCC) 09/02/2017  . Acute diverticulitis 08/29/2017  . Asthma with status asthmaticus   . Diabetes mellitus without complication (HCC) 08/24/2017  . CHF, acute on chronic (HCC) 08/24/2017  . Hypertension 08/24/2017  . Acute viral bronchitis 08/24/2017  . Hypertensive urgency 08/24/2017   OR today for Hartman's colectomy for failed nonoperative management -we discussed the details of the surgery of general anesthetic, midline incision, removal of the affected portion of colon, washing out the infection, creation of end colostomy, and closure. We discussed risks including bleeding, abscess, ureteral injury, difficulty with colostomy and waiting > 3 months prior to reversal, likely additional week hospitalization and incisional pain.    LOS: 3 days  Chief Complaint/Subjective: Continued pain has worsened this morning, pressure has returned with urination as well  Objective: Vital signs in last 24 hours: Temp:  [97.7 F (36.5 C)-98 F (36.7 C)] 98 F (36.7 C) (01/28 0533) Pulse Rate:  [72-76] 72 (01/28 0533) Resp:  [18-20] 18 (01/28 0533) BP: (151-153)/(87-96) 153/94 (01/28 0533) SpO2:  [98 %-100 %] 98 % (01/28 0533) Last BM Date: 08/30/17  Intake/Output from previous day: 01/27 0701 - 01/28 0700 In: -  Out: 2350 [Urine:2350] Intake/Output this shift: Total I/O In: -  Out: 400 [Urine:400]  Lungs: CTAB  Cardiovascular: RRR  Abd: TTP LUQ and LLQ and RUQ with guarding  Extremities: no edema  Neuro: AOx4  Lab Results: CBC  Recent Labs    09/04/17 0255 09/04/17 2343  WBC 6.8 6.8  HGB 12.4* 12.0*  HCT 36.4* 34.8*  PLT 417* 415*   BMET Recent Labs    09/04/17 0255 09/04/17 2343  NA 133* 134*  K 4.0 4.0  CL 103 104  CO2 23 22  GLUCOSE 94 99  BUN 9 6  CREATININE 0.88 0.90  CALCIUM 7.8* 7.7*   PT/INR No results for input(s):  LABPROT, INR in the last 72 hours. ABG No results for input(s): PHART, HCO3 in the last 72 hours.  Invalid input(s): PCO2, PO2  Studies/Results:  Anti-infectives: Anti-infectives (From admission, onward)   Start     Dose/Rate Route Frequency Ordered Stop   09/02/17 0200  ertapenem (INVANZ) 1 g in sodium chloride 0.9 % 50 mL IVPB     1 g 100 mL/hr over 30 Minutes Intravenous Every 24 hours 09/02/17 0139     09/02/17 0115  vancomycin (VANCOCIN) IVPB 1000 mg/200 mL premix     1,000 mg 200 mL/hr over 60 Minutes Intravenous  Once 09/02/17 0114 09/02/17 0221   09/02/17 0115  piperacillin-tazobactam (ZOSYN) IVPB 3.375 g  Status:  Discontinued     3.375 g 100 mL/hr over 30 Minutes Intravenous  Once 09/02/17 0114 09/02/17 0139      Medications: Scheduled Meds: . acetaminophen  1,000 mg Oral Q8H  . carvedilol  10 mg Oral Daily  . enoxaparin (LOVENOX) injection  40 mg Subcutaneous Q24H  . insulin aspart  0-9 Units Subcutaneous Q4H  . ketorolac  15 mg Intravenous Q6H  . lisinopril  10 mg Oral Daily   Continuous Infusions: . sodium chloride 125 mL/hr at 09/04/17 2356  . ertapenem Stopped (09/05/17 0047)  . famotidine (PEPCID) IV Stopped (09/04/17 2236)   PRN Meds:.HYDROmorphone (DILAUDID) injection, ondansetron **OR** ondansetron (ZOFRAN) IV, oxyCODONE  Rodman PickleLuke Aaron [name], MD Pg# [phone] [facility]

## 2017-09-05 NOTE — Anesthesia Procedure Notes (Signed)
Procedure Name: Intubation Date/Time: 09/05/2017 12:43 PM Performed by: Rosiland OzMeyers, Zeno Hickel, CRNA Pre-anesthesia Checklist: Patient identified, Emergency Drugs available, Suction available, Patient being monitored and Timeout performed Patient Re-evaluated:Patient Re-evaluated prior to induction Oxygen Delivery Method: Circle system utilized Preoxygenation: Pre-oxygenation with 100% oxygen Induction Type: IV induction Ventilation: Mask ventilation without difficulty Laryngoscope Size: Miller and 3 Grade View: Grade I Tube type: Oral Tube size: 7.5 mm Number of attempts: 1 Airway Equipment and Method: Stylet Placement Confirmation: ETT inserted through vocal cords under direct vision,  positive ETCO2 and breath sounds checked- equal and bilateral Secured at: 21 cm Tube secured with: Tape Dental Injury: Teeth and Oropharynx as per pre-operative assessment

## 2017-09-05 NOTE — Anesthesia Postprocedure Evaluation (Signed)
Anesthesia Post Note  Patient: Kenneth Rios  Procedure(s) Performed: HARTMAN'S COLECTOMY AND COLOSTOMY (N/A Abdomen)     Patient location during evaluation: PACU Anesthesia Type: General Level of consciousness: awake and alert Pain management: pain level controlled Vital Signs Assessment: post-procedure vital signs reviewed and stable Respiratory status: spontaneous breathing, nonlabored ventilation and respiratory function stable Cardiovascular status: blood pressure returned to baseline and stable Postop Assessment: no apparent nausea or vomiting Anesthetic complications: no    Last Vitals:  Vitals:   09/05/17 1545 09/05/17 1555  BP: (!) 141/88 (!) 152/86  Pulse: 78 80  Resp: 16 20  Temp:  36.7 C  SpO2: 99% 97%    Last Pain:  Vitals:   09/05/17 1506  TempSrc:   PainSc: 10-Worst pain ever                 Delva Derden,W. EDMOND

## 2017-09-05 NOTE — Progress Notes (Signed)
PROGRESS NOTE    Kenneth Rios   WGN:[id number]  DOB: [birth year]  DOA: 09/01/2017 PCP: Patient, No Pcp Per   Brief Narrative:  Kenneth Rios is a 52 y.o. male with medical history significant of CHF, DM2, HTN, diverticulitis.  Patient was admitted from 1/16 to 1/22.  Initially for CHF exacerbation.  During that admit he developed onset of ABD pain determined on 1/21 to be due to recurrent diverticulitis.  Previously hospitalized for diverticulitis in 2015. Returns to the ED with c/o worsening abd pain.  Severe.  LLQ. Cramping.  Nothing makes it better or worse.   Subjective: Ongoing severe pain. Going to OR today.    Assessment & Plan:   Principal Problem:   Acute diverticulitis with abscess and Peritonitis   - cont Invanz- gen surgery taking to surgery today - NPO - IVF   Active Problems: Dehydration - holding Lasix - cont IVF while NPO- follow for fluid overload    Diabetes mellitus without complication  - cont SSI    Hypertension -    cont Coreg and Lisinopril  DVT prophylaxis: Lovenox Code Status: Full code Family Communication: mother Disposition Plan:  Consultants:   gen sugery Procedures:    Antimicrobials:  Anti-infectives (From admission, onward)   Start     Dose/Rate Route Frequency Ordered Stop   09/02/17 0200  ertapenem (INVANZ) 1 g in sodium chloride 0.9 % 50 mL IVPB     1 g 100 mL/hr over 30 Minutes Intravenous Every 24 hours 09/02/17 0139     09/02/17 0115  vancomycin (VANCOCIN) IVPB 1000 mg/200 mL premix     1,000 mg 200 mL/hr over 60 Minutes Intravenous  Once 09/02/17 0114 09/02/17 0221   09/02/17 0115  piperacillin-tazobactam (ZOSYN) IVPB 3.375 g  Status:  Discontinued     3.375 g 100 mL/hr over 30 Minutes Intravenous  Once 09/02/17 0114 09/02/17 0139       Objective: Vitals:   09/04/17 1749 09/04/17 2229 09/04/17 2229 09/05/17 0533  BP: (!) 151/96 (!) 152/87 (!) 152/87 (!) 153/94  Pulse: 76 74 74 72  Resp: 20 20 20 18     Temp: 97.7 F (36.5 C) 97.9 F (36.6 C) 97.9 F (36.6 C) 98 F (36.7 C)  TempSrc: Oral Oral  Oral  SpO2: 100% 98% 98% 98%  Height:        Intake/Output Summary (Last 24 hours) at 09/05/2017 1113 Last data filed at 09/05/2017 0848 Gross per 24 hour  Intake -  Output 2675 ml  Net -2675 ml   There were no vitals filed for this visit.  Examination: General exam: Appears comfortable  HEENT: PERRLA, oral mucosa moist, no sclera icterus or thrush Respiratory system: Clear to auscultation. Respiratory effort normal. Cardiovascular system: S1 & S2 heard, RRR.  No murmurs  Gastrointestinal system: Abdomen soft, continues to be quite tender across lower abdomen, nondistended. Normal bowel sound. No organomegaly Central nervous system: Alert and oriented. No focal neurological deficits. Extremities: No cyanosis, clubbing or edema Skin: No rashes or ulcers Psychiatry:  Mood & affect appropriate.     Data Reviewed: I have personally reviewed following labs and imaging studies  CBC: Recent Labs  Lab 08/30/17 0448 09/01/17 1724 09/03/17 0350 09/04/17 0255 09/04/17 2343  WBC 13.1* 8.3 7.6 6.8 6.8  HGB 14.2 13.7 12.9* 12.4* 12.0*  HCT 41.8 40.3 38.6* 36.4* 34.8*  MCV 83.6 83.1 83.4 82.0 80.4  PLT 444* 451* 391 417* 415*   Basic Metabolic Panel: Recent Labs  Lab 08/30/17 0448 09/01/17 1724 09/03/17 0350 09/04/17 0255 09/04/17 2343  NA 135 137 137 133* 134*  K 3.6 4.1 4.3 4.0 4.0  CL 97* 104 108 103 104  CO2 26 22 23 23 22   GLUCOSE 110* 136* 74 94 99  BUN 35* 17 10 9 6   CREATININE 1.24 1.14 0.97 0.88 0.90  CALCIUM 8.7* 8.2* 8.1* 7.8* 7.7*   GFR: Estimated Creatinine Clearance: 110.4 mL/min (by C-G formula based on SCr of 0.9 mg/dL). Liver Function Tests: Recent Labs  Lab 09/01/17 1724  AST 20  ALT 17  ALKPHOS 48  BILITOT 0.9  PROT 6.4*  ALBUMIN 3.1*   Recent Labs  Lab 09/01/17 1724  LIPASE 21   No results for input(s): AMMONIA in the last 168  hours. Coagulation Profile: No results for input(s): INR, PROTIME in the last 168 hours. Cardiac Enzymes: No results for input(s): CKTOTAL, CKMB, CKMBINDEX, TROPONINI in the last 168 hours. BNP (last 3 results) No results for input(s): PROBNP in the last 8760 hours. HbA1C: No results for input(s): HGBA1C in the last 72 hours. CBG: Recent Labs  Lab 09/04/17 1747 09/04/17 2014 09/05/17 0018 09/05/17 0407 09/05/17 0748  GLUCAP 80 97 86 73 78   Lipid Profile: No results for input(s): CHOL, HDL, LDLCALC, TRIG, CHOLHDL, LDLDIRECT in the last 72 hours. Thyroid Function Tests: No results for input(s): TSH, T4TOTAL, FREET4, T3FREE, THYROIDAB in the last 72 hours. Anemia Panel: No results for input(s): VITAMINB12, FOLATE, FERRITIN, TIBC, IRON, RETICCTPCT in the last 72 hours. Urine analysis:    Component Value Date/Time   COLORURINE AMBER (A) 09/01/2017 1730   APPEARANCEUR CLEAR 09/01/2017 1730   APPEARANCEUR Clear 12/11/2013 0100   LABSPEC 1.036 (H) 09/01/2017 1730   LABSPEC 1.054 12/11/2013 0100   PHURINE 7.0 09/01/2017 1730   GLUCOSEU NEGATIVE 09/01/2017 1730   GLUCOSEU Negative 12/11/2013 0100   HGBUR NEGATIVE 09/01/2017 1730   BILIRUBINUR SMALL (A) 09/01/2017 1730   BILIRUBINUR Negative 12/11/2013 0100   KETONESUR NEGATIVE 09/01/2017 1730   PROTEINUR 30 (A) 09/01/2017 1730   NITRITE NEGATIVE 09/01/2017 1730   LEUKOCYTESUR SMALL (A) 09/01/2017 1730   LEUKOCYTESUR Negative 12/11/2013 0100   Sepsis Labs: @LABRCNTIP (procalcitonin:4,lacticidven:4) ) No results found for this or any previous visit (from the past 240 hour(s)).       Radiology Studies: No results found.    Scheduled Meds: . acetaminophen  1,000 mg Oral Q8H  . carvedilol  10 mg Oral Daily  . enoxaparin (LOVENOX) injection  40 mg Subcutaneous Q24H  . gabapentin  300 mg Oral On Call to OR  . insulin aspart  0-9 Units Subcutaneous Q4H  . ketorolac  15 mg Intravenous Q6H  . lisinopril  10 mg Oral Daily    Continuous Infusions: . sodium chloride 125 mL/hr at 09/05/17 0955  . ertapenem Stopped (09/05/17 0047)  . famotidine (PEPCID) IV Stopped (09/04/17 2236)     LOS: 3 days    Time spent in minutes: 35    Calvert Cantor, MD [facility] Pager: [email or web address] Password [email or web address] 09/05/2017, 11:13 AM

## 2017-09-05 NOTE — Op Note (Signed)
Preoperative diagnosis: diverticulitis  Postoperative diagnosis: same   Procedure: open partial colectomy with end colostomy  Surgeon: Kenneth Rios, M.D.  Asst: Kenneth Rios  Anesthesia: general  Indications for procedure: Kenneth Rios is a 52 y.o. year old male with symptoms of abdominal pain and findings of perforated diverticulitis. He had a small abscess and was tried on antibiotics for 4 days without improvement. Therefore decision was made to perform resection with colostomy.  Description of procedure: The patient was brought into the operative suite. Anesthesia was administered with General endotracheal anesthesia. WHO checklist was applied. The patient was then placed in supine position. The area was prepped and draped in the usual sterile fashion.  Next, a midline incision was made. Cautery was used to dissect through the subcutaneous tissues and fascia. The fascia was safely entered. Upon initial inspection, the omentum was stuck to the sigmoid colon and the sigmoid colon was also adhered to the bladder. Blunt finger fracture was used to free the colon from the side wall and bladder. Cautery was used to divide the omentum. Next, the left White line of Toldt was incised moving proximal. A location of healthy colon was identified for proximal margin. Further blunt dissection distally freed the sigmoid and proximal rectum from the surrounding contents. Next, a blue load contour stapler was used to divide the proximal sigmoid colon. Ligasure was used to take vessels close to the colon moving distally. Further blunt dissection was used to isolate the rectosigmoid junction and then an additional blue load contour stapler was used to divide the colon at the rectosigmoid junction. 0 Prolene was sutured into the staple line on each end and left with a 2-3in tail.  A location on the left abdominal wall was chosen for colostomy location. A circular incision was made in the skin and blunt  dissection was used to identify the anterior rectus sheath. A cruciate incision was made through the anterior rectus sheath, the muscle fibers were bluntly dissected to identify the posterior rectus sheath and a cruciate incision was made in the posterior rectus sheath and dilated to fit the colon. There was a bleeding vessel that was ligated with cautery in the posterior aspect of the muscle. The colon was passed through the site and clamped extracorporeally.  Next, the abdomen was irrigated with warm saline. Because the fascia was a bit off midline, the peritoneum was closed with a 0 vicryl The fascia was closed using a 0 PDS in running fashion. The subcutaneous wound was covered with a towel. The ostomy was matured with 3-0 vicryl with a small amount of Brooking in the cardinal direction.The skin was left open and packed with a black sponge vac.  Findings: perforated diverticulitis with adherence of the sigmoid colon to the omentum, abdominal wall and bladder  Specimen: sigmoid colon  Implant: black sponge vac   Blood loss: 200ml  Local anesthesia: none  Complications: none  Kenneth Rios, M.D. General, Bariatric, & Minimally Invasive Surgery [facility]

## 2017-09-05 NOTE — Progress Notes (Signed)
Dr Okey Dupreose informed of earrings and unable to remove tape applied over ear rings.

## 2017-09-05 NOTE — Progress Notes (Addendum)
Pt states he can not remove the earrings that they will have to be cut off. Neck lace noted given to his wife Tiffney

## 2017-09-05 NOTE — Transfer of Care (Signed)
Immediate Anesthesia Transfer of Care Note  Patient: Kenneth Rios  Procedure(s) Performed: HARTMAN'S COLECTOMY AND COLOSTOMY (N/A Abdomen)  Patient Location: PACU  Anesthesia Type:General  Level of Consciousness: awake and patient cooperative  Airway & Oxygen Therapy: Patient Spontanous Breathing  Post-op Assessment: Report given to RN and Post -op Vital signs reviewed and stable  Post vital signs: Reviewed and stable  Last Vitals:  Vitals:   09/04/17 2229 09/05/17 0533  BP: (!) 152/87 (!) 153/94  Pulse: 74 72  Resp: 20 18  Temp: 36.6 C 36.7 C  SpO2: 98% 98%    Last Pain:  Vitals:   09/05/17 1051  TempSrc:   PainSc: 2       Patients Stated Pain Goal: 2 (09/05/17 0810)  Complications: No apparent anesthesia complications

## 2017-09-05 NOTE — Anesthesia Preprocedure Evaluation (Addendum)
Anesthesia Evaluation  Patient identified by MRN, date of birth, ID band Patient awake    Reviewed: Allergy & Precautions, NPO status , Patient's Chart, lab work & pertinent test results  Airway Mallampati: II  TM Distance: >3 FB Neck ROM: Full    Dental no notable dental hx.    Pulmonary shortness of breath and with exertion, former smoker,    Pulmonary exam normal breath sounds clear to auscultation       Cardiovascular hypertension, Pt. on medications +CHF   Rhythm:Regular Rate:Normal + Systolic murmurs Left ventricle: Inferior / Inferior basal hypokinesis The cavity   size was mildly dilated. Wall thickness was increased in a   pattern of mild LVH. Systolic function was mildly reduced. The   estimated ejection fraction was in the range of 45% to 50%.   Doppler parameters are consistent with elevated ventricular   end-diastolic filling pressure. - Mitral valve: Restricted posterior leaflet motion. There was mild   regurgitation. - Left atrium: The atrium was moderately dilated. - Atrial septum: No defect or patent foramen ovale was identified.  ------------------------------------------------------------------- Study data:  No prior study was available for comparison.  Study status:  Routine.  Procedure:  The patient reported no pain pre or post test. Transthoracic echocardiography. Image quality was adequate.  Study completion:  There were no complications. Transthoracic echocardiography.  M-mode, complete 2D, spectral Doppler, and color Doppler.  Birthdate:  Patient birthdate: 10-27-1965.  Age:  Patient is 52 yr old.  Sex:  Gender: male. BMI: 31.1 kg/m^2.  Blood pressure:     157/90  Patient status: Inpatient.  Study date:  Study date: 08/27/2017. Study time: 09:27 AM.  Location:   Bedside.  -------------------------------------------------------------------  ------------------------------------------------------------------- Left ventricle:  Inferior / Inferior basal hypokinesis The cavity size was mildly dilated. Wall thickness was increased in a pattern of mild LVH. Systolic function was mildly reduced. The estimated ejection fraction was in the range of 45% to 50%. Doppler parameters are consistent with elevated ventricular end-diastolic filling pressure.   Neuro/Psych negative neurological ROS  negative psych ROS   GI/Hepatic negative GI ROS, Neg liver ROS,   Endo/Other  diabetes  Renal/GU negative Renal ROS  negative genitourinary   Musculoskeletal negative musculoskeletal ROS (+)   Abdominal   Peds negative pediatric ROS (+)  Hematology negative hematology ROS (+)   Anesthesia Other Findings   Reproductive/Obstetrics negative OB ROS                            Anesthesia Physical Anesthesia Plan  ASA: III  Anesthesia Plan: General   Post-op Pain Management:    Induction: Intravenous  PONV Risk Score and Plan: 2 and Ondansetron, Dexamethasone and Treatment may vary due to age or medical condition  Airway Management Planned: Oral ETT  Additional Equipment:   Intra-op Plan:   Post-operative Plan: Extubation in OR  Informed Consent: I have reviewed the patients History and Physical, chart, labs and discussed the procedure including the risks, benefits and alternatives for the proposed anesthesia with the patient or authorized representative who has indicated his/her understanding and acceptance.   Dental advisory given  Plan Discussed with: CRNA and Surgeon  Anesthesia Plan Comments:         Anesthesia Quick Evaluation

## 2017-09-06 ENCOUNTER — Encounter (HOSPITAL_COMMUNITY): Payer: Self-pay | Admitting: General Surgery

## 2017-09-06 LAB — GLUCOSE, CAPILLARY
GLUCOSE-CAPILLARY: 125 mg/dL — AB (ref 65–99)
GLUCOSE-CAPILLARY: 213 mg/dL — AB (ref 65–99)
Glucose-Capillary: 108 mg/dL — ABNORMAL HIGH (ref 65–99)
Glucose-Capillary: 102 mg/dL — ABNORMAL HIGH (ref 65–99)
Glucose-Capillary: 148 mg/dL — ABNORMAL HIGH (ref 65–99)
Glucose-Capillary: 150 mg/dL — ABNORMAL HIGH (ref 65–99)

## 2017-09-06 LAB — CBC
HEMATOCRIT: 34.6 % — AB (ref 39.0–52.0)
HEMOGLOBIN: 12.4 g/dL — AB (ref 13.0–17.0)
MCH: 28.8 pg (ref 26.0–34.0)
MCHC: 35.8 g/dL (ref 30.0–36.0)
MCV: 80.5 fL (ref 78.0–100.0)
Platelets: 431 10*3/uL — ABNORMAL HIGH (ref 150–400)
RBC: 4.3 MIL/uL (ref 4.22–5.81)
RDW: 12.4 % (ref 11.5–15.5)
WBC: 10 10*3/uL (ref 4.0–10.5)

## 2017-09-06 MED ORDER — INSULIN ASPART 100 UNIT/ML ~~LOC~~ SOLN
0.0000 [IU] | Freq: Three times a day (TID) | SUBCUTANEOUS | Status: DC
  Administered 2017-09-06: 3 [IU] via SUBCUTANEOUS
  Administered 2017-09-06 – 2017-09-12 (×4): 1 [IU] via SUBCUTANEOUS

## 2017-09-06 MED ORDER — METFORMIN HCL 500 MG PO TABS
500.0000 mg | ORAL_TABLET | Freq: Two times a day (BID) | ORAL | Status: DC
  Administered 2017-09-06 – 2017-09-13 (×15): 500 mg via ORAL
  Filled 2017-09-06 (×15): qty 1

## 2017-09-06 MED ORDER — GLUCERNA SHAKE PO LIQD
237.0000 mL | Freq: Three times a day (TID) | ORAL | Status: DC
  Administered 2017-09-06 – 2017-09-13 (×18): 237 mL via ORAL
  Filled 2017-09-06: qty 237

## 2017-09-06 NOTE — Progress Notes (Signed)
  Progress Note: General Surgery Service   Assessment/Plan: Patient Active Problem List   Diagnosis Date Noted  . Peritonitis (HCC) 09/02/2017  . Acute diverticulitis 08/29/2017  . Asthma with status asthmaticus   . Diabetes mellitus without complication (HCC) 08/24/2017  . CHF, acute on chronic (HCC) 08/24/2017  . Hypertension 08/24/2017  . Acute viral bronchitis 08/24/2017  . Hypertensive urgency 08/24/2017   s/p Procedure(s): HARTMAN'S COLECTOMY AND COLOSTOMY 09/05/2017 -full liquids today -up to chair -maintain foley until tomorrow due to pelvic nature of surgery and some bladder dissection    LOS: 4 days  Chief Complaint/Subjective: Pain improved, sore all over, some air in bag overnight, no nausea  Objective: Vital signs in last 24 hours: Temp:  [97.6 F (36.4 C)-98.5 F (36.9 C)] 98 F (36.7 C) (01/29 0209) Pulse Rate:  [77-98] 84 (01/29 0209) Resp:  [16-23] 20 (01/29 0209) BP: (123-195)/(81-135) 150/97 (01/29 0209) SpO2:  [96 %-100 %] 99 % (01/29 0209) Last BM Date: 08/30/17  Intake/Output from previous day: 01/28 0701 - 01/29 0700 In: 1375 [I.V.:1300] Out: 1125 [Urine:925; Blood:200] Intake/Output this shift: Total I/O In: -  Out: 1175 [Urine:1175]  Lungs: CTAB  Cardiovascular: RRR  Abd: soft, ATTP, vac in place and functional, ostomy pink with small amount of drainage  Extremities: no edema  Neuro: AOx4  Lab Results: CBC  Recent Labs    09/04/17 2343 09/06/17 0751  WBC 6.8 10.0  HGB 12.0* 12.4*  HCT 34.8* 34.6*  PLT 415* 431*   BMET Recent Labs    09/04/17 0255 09/04/17 2343  NA 133* 134*  K 4.0 4.0  CL 103 104  CO2 23 22  GLUCOSE 94 99  BUN 9 6  CREATININE 0.88 0.90  CALCIUM 7.8* 7.7*   PT/INR No results for input(s): LABPROT, INR in the last 72 hours. ABG No results for input(s): PHART, HCO3 in the last 72 hours.  Invalid input(s): PCO2, PO2  Studies/Results:  Anti-infectives: Anti-infectives (From admission,  onward)   Start     Dose/Rate Route Frequency Ordered Stop   09/02/17 0200  ertapenem (INVANZ) 1 g in sodium chloride 0.9 % 50 mL IVPB     1 g 100 mL/hr over 30 Minutes Intravenous Every 24 hours 09/02/17 0139     09/02/17 0115  vancomycin (VANCOCIN) IVPB 1000 mg/200 mL premix     1,000 mg 200 mL/hr over 60 Minutes Intravenous  Once 09/02/17 0114 09/02/17 0221   09/02/17 0115  piperacillin-tazobactam (ZOSYN) IVPB 3.375 g  Status:  Discontinued     3.375 g 100 mL/hr over 30 Minutes Intravenous  Once 09/02/17 0114 09/02/17 0139      Medications: Scheduled Meds: . acetaminophen  1,000 mg Oral Q8H  . carvedilol  10 mg Oral Daily  . enoxaparin (LOVENOX) injection  40 mg Subcutaneous Q24H  . insulin aspart  0-9 Units Subcutaneous Q4H  . ketorolac  15 mg Intravenous Q6H  . lisinopril  10 mg Oral Daily   Continuous Infusions: . sodium chloride 125 mL/hr at 09/06/17 0540  . ertapenem Stopped (09/06/17 0244)  . famotidine (PEPCID) IV Stopped (09/05/17 2301)  . lactated ringers Stopped (09/05/17 1440)   PRN Meds:.HYDROmorphone (DILAUDID) injection, ondansetron **OR** ondansetron (ZOFRAN) IV, oxyCODONE  Rodman PickleLuke Aaron Julea Hutto, MD Pg# [phone] [facility]

## 2017-09-06 NOTE — Progress Notes (Addendum)
Pt complains of an 8/10 "twisting" pain below his incision site. MD Riswan and [name] made aware. Will continue to assess.

## 2017-09-06 NOTE — Progress Notes (Signed)
Patient reporting pain in left groin noting it as a pressure pain. Reassessed area and looked the same as this morning.  Patient did recently transfer back to bed after being out of bed in the chair for five hours.  Pain occurred after transfer.  Will endorse to receiving RN to monitor.

## 2017-09-06 NOTE — Progress Notes (Signed)
PROGRESS NOTE    Kenneth Rios   AVW:[id number]  DOB: [birth year]  DOA: 09/01/2017 PCP: Patient, No Pcp Per   Brief Narrative:  Kenneth Rios is a 52 y.o. male with medical history significant of CHF, DM2, HTN, diverticulitis.  Patient was admitted from 1/16 to 1/22.  Initially for CHF exacerbation.  During that admit he developed onset of ABD pain determined on 1/21 to be due to recurrent diverticulitis.  Previously hospitalized for diverticulitis in 2015. Returns to the ED with c/o worsening abd pain.  Severe.  LLQ. Cramping.  Nothing makes it better or worse.   Subjective: Pain improving. Passing gas in colostomy.    Assessment & Plan:   Principal Problem:   Acute diverticulitis with abscess and Peritonitis   - management by surgery -  1/28- s/p Hartman's colectomy and colostomy - full liquids today - advance per surgery - cut back IVF   Active Problems: Dehydration - holding Lasix - cont IVF while NPO- follow for fluid overload    Diabetes mellitus without complication  - cont SSI- resume Metformin today    Hypertension -    cont Coreg and Lisinopril  DVT prophylaxis: Lovenox Code Status: Full code Family Communication: mother Disposition Plan:  Consultants:   gen sugery Procedures:    Antimicrobials:  Anti-infectives (From admission, onward)   Start     Dose/Rate Route Frequency Ordered Stop   09/02/17 0200  ertapenem (INVANZ) 1 g in sodium chloride 0.9 % 50 mL IVPB     1 g 100 mL/hr over 30 Minutes Intravenous Every 24 hours 09/02/17 0139     09/02/17 0115  vancomycin (VANCOCIN) IVPB 1000 mg/200 mL premix     1,000 mg 200 mL/hr over 60 Minutes Intravenous  Once 09/02/17 0114 09/02/17 0221   09/02/17 0115  piperacillin-tazobactam (ZOSYN) IVPB 3.375 g  Status:  Discontinued     3.375 g 100 mL/hr over 30 Minutes Intravenous  Once 09/02/17 0114 09/02/17 0139       Objective: Vitals:   09/05/17 2202 09/06/17 0209 09/06/17 0736 09/06/17 1041    BP: (!) 154/85 (!) 150/97 (!) 179/86 (!) 159/86  Pulse: 83 84 76 78  Resp: 20 20 19    Temp: 97.6 F (36.4 C) 98 F (36.7 C) 98 F (36.7 C)   TempSrc: Oral Oral Oral   SpO2: 100% 99% 100%   Height:        Intake/Output Summary (Last 24 hours) at 09/06/2017 1222 Last data filed at 09/06/2017 [id number] Gross per 24 hour  Intake 1375 ml  Output 1575 ml  Net -200 ml   There were no vitals filed for this visit.  Examination: General exam: Appears comfortable  HEENT: PERRLA, oral mucosa moist, no sclera icterus or thrush Respiratory system: Clear to auscultation. Respiratory effort normal. Cardiovascular system: S1 & S2 heard, RRR.  No murmurs  Gastrointestinal system: Abdomen soft, mild- mod tenderness across lower abdomen, nondistended. Normal bowel sound. No organomegaly Central nervous system: Alert and oriented. No focal neurological deficits. Extremities: No cyanosis, clubbing or edema Skin: No rashes or ulcers Psychiatry:  Mood & affect appropriate.     Data Reviewed: I have personally reviewed following labs and imaging studies  CBC: Recent Labs  Lab 09/01/17 1724 09/03/17 0350 09/04/17 0255 09/04/17 2343 09/06/17 0751  WBC 8.3 7.6 6.8 6.8 10.0  HGB 13.7 12.9* 12.4* 12.0* 12.4*  HCT 40.3 38.6* 36.4* 34.8* 34.6*  MCV 83.1 83.4 82.0 80.4 80.5  PLT 451* 391 417*  415* 431*   Basic Metabolic Panel: Recent Labs  Lab 09/01/17 1724 09/03/17 0350 09/04/17 0255 09/04/17 2343  NA 137 137 133* 134*  K 4.1 4.3 4.0 4.0  CL 104 108 103 104  CO2 22 23 23 22   GLUCOSE 136* 74 94 99  BUN 17 10 9 6   CREATININE 1.14 0.97 0.88 0.90  CALCIUM 8.2* 8.1* 7.8* 7.7*   GFR: Estimated Creatinine Clearance: 110.4 mL/min (by C-G formula based on SCr of 0.9 mg/dL). Liver Function Tests: Recent Labs  Lab 09/01/17 1724  AST 20  ALT 17  ALKPHOS 48  BILITOT 0.9  PROT 6.4*  ALBUMIN 3.1*   Recent Labs  Lab 09/01/17 1724  LIPASE 21   No results for input(s): AMMONIA in the  last 168 hours. Coagulation Profile: No results for input(s): INR, PROTIME in the last 168 hours. Cardiac Enzymes: No results for input(s): CKTOTAL, CKMB, CKMBINDEX, TROPONINI in the last 168 hours. BNP (last 3 results) No results for input(s): PROBNP in the last 8760 hours. HbA1C: No results for input(s): HGBA1C in the last 72 hours. CBG: Recent Labs  Lab 09/05/17 1700 09/05/17 2020 09/06/17 0045 09/06/17 0416 09/06/17 0819  GLUCAP 102* 133* 125* 108* 102*   Lipid Profile: No results for input(s): CHOL, HDL, LDLCALC, TRIG, CHOLHDL, LDLDIRECT in the last 72 hours. Thyroid Function Tests: No results for input(s): TSH, T4TOTAL, FREET4, T3FREE, THYROIDAB in the last 72 hours. Anemia Panel: No results for input(s): VITAMINB12, FOLATE, FERRITIN, TIBC, IRON, RETICCTPCT in the last 72 hours. Urine analysis:    Component Value Date/Time   COLORURINE AMBER (A) 09/01/2017 1730   APPEARANCEUR CLEAR 09/01/2017 1730   APPEARANCEUR Clear 12/11/2013 0100   LABSPEC 1.036 (H) 09/01/2017 1730   LABSPEC 1.054 12/11/2013 0100   PHURINE 7.0 09/01/2017 1730   GLUCOSEU NEGATIVE 09/01/2017 1730   GLUCOSEU Negative 12/11/2013 0100   HGBUR NEGATIVE 09/01/2017 1730   BILIRUBINUR SMALL (A) 09/01/2017 1730   BILIRUBINUR Negative 12/11/2013 0100   KETONESUR NEGATIVE 09/01/2017 1730   PROTEINUR 30 (A) 09/01/2017 1730   NITRITE NEGATIVE 09/01/2017 1730   LEUKOCYTESUR SMALL (A) 09/01/2017 1730   LEUKOCYTESUR Negative 12/11/2013 0100   Sepsis Labs: @LABRCNTIP (procalcitonin:4,lacticidven:4) ) Recent Results (from the past 240 hour(s))  MRSA PCR Screening     Status: None   Collection Time: 09/05/17 10:03 AM  Result Value Ref Range Status   MRSA by PCR NEGATIVE NEGATIVE Final    Comment:        The GeneXpert MRSA Assay (FDA approved for NASAL specimens only), is one component of a comprehensive MRSA colonization surveillance program. It is not intended to diagnose MRSA infection nor to guide  or monitor treatment for MRSA infections.          Radiology Studies: No results found.    Scheduled Meds: . acetaminophen  1,000 mg Oral Q8H  . carvedilol  10 mg Oral Daily  . enoxaparin (LOVENOX) injection  40 mg Subcutaneous Q24H  . feeding supplement (GLUCERNA SHAKE)  237 mL Oral TID BM  . insulin aspart  0-9 Units Subcutaneous TID WC  . ketorolac  15 mg Intravenous Q6H  . lisinopril  10 mg Oral Daily  . metFORMIN  500 mg Oral BID WC   Continuous Infusions: . sodium chloride 125 mL/hr at 09/06/17 0540  . ertapenem Stopped (09/06/17 0244)  . famotidine (PEPCID) IV 20 mg (09/06/17 1049)  . lactated ringers Stopped (09/05/17 1440)     LOS: 4 days  Time spent in minutes: 35    Calvert Cantor, MD [facility] Pager: [email or web address] Password [facility] 09/06/2017, 12:22 PM

## 2017-09-06 NOTE — Consult Note (Signed)
WOC Nurse ostomy consult note Stoma type/location: LLQ, end colostomy Stomal assessment/size:  aprox. 1 3/4" round, pink, moist, budded from skin level Peristomal assessment: NA Treatment options for stomal/peristomal skin: NA Output bloody Ostomy pouching: 2pc. 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  I have explained the risk of hernia and complications this might pose for the patient.  Provided patient with [facility] Catalog and marked items currently using Patient is in the room with his "fiance" and younger brother.  Reports his fiance will be assisting him at home, she works in a group home. As WOC nurse is talking and looking at abdomen patient looks away and did not want to look, responds with "she's got this" Patient reports he is uninsured at this time. I will provide the patient with [facility] indigent [facility] information.  I have explained to his fiance that it is very important for him to complete the needed steps to get supplies from the program. The WOC nurse is not allowed to assist with this process.  Answered patient/family questions:    Enrolled patient in [employer] Secure Start DC program: No  WOC Nurse wound consult note Reason for Consult: surgical wound with NPWT in place, POD1 Wound type: surgical  Pressure Injury POA: NA NPWT dressing in place, intact with good seal. For first change beginning tomorrow.  WOC Nurse will plan to change NPWT dressing and ostomy pouch at the same time and provide teaching fro patient and fiance at that time.  Supplies ordered to the bedside. [facility] for continued support with ostomy care and of course will need for  NPWT dressing changes if patient DC to home with device.   WOC Nurse will follow along with you for continued support with ostomy teaching and care Emanuel Campos [facility] MSN, RN, [place]OCN, CNS, [place] [phone]

## 2017-09-07 LAB — BASIC METABOLIC PANEL
ANION GAP: 6 (ref 5–15)
BUN: 11 mg/dL (ref 6–20)
CALCIUM: 8 mg/dL — AB (ref 8.9–10.3)
CO2: 25 mmol/L (ref 22–32)
Chloride: 105 mmol/L (ref 101–111)
Creatinine, Ser: 0.97 mg/dL (ref 0.61–1.24)
Glucose, Bld: 115 mg/dL — ABNORMAL HIGH (ref 65–99)
POTASSIUM: 4.2 mmol/L (ref 3.5–5.1)
SODIUM: 136 mmol/L (ref 135–145)

## 2017-09-07 LAB — GLUCOSE, CAPILLARY
GLUCOSE-CAPILLARY: 117 mg/dL — AB (ref 65–99)
GLUCOSE-CAPILLARY: 83 mg/dL (ref 65–99)
Glucose-Capillary: 135 mg/dL — ABNORMAL HIGH (ref 65–99)
Glucose-Capillary: 131 mg/dL — ABNORMAL HIGH (ref 65–99)
Glucose-Capillary: 91 mg/dL (ref 65–99)
Glucose-Capillary: 104 mg/dL — ABNORMAL HIGH (ref 65–99)

## 2017-09-07 LAB — CBC
HEMATOCRIT: 31.8 % — AB (ref 39.0–52.0)
HEMOGLOBIN: 10.8 g/dL — AB (ref 13.0–17.0)
MCH: 27.6 pg (ref 26.0–34.0)
MCHC: 34 g/dL (ref 30.0–36.0)
MCV: 81.1 fL (ref 78.0–100.0)
Platelets: 390 10*3/uL (ref 150–400)
RBC: 3.92 MIL/uL — AB (ref 4.22–5.81)
RDW: 12.8 % (ref 11.5–15.5)
WBC: 8.6 10*3/uL (ref 4.0–10.5)

## 2017-09-07 MED ORDER — FAMOTIDINE 20 MG PO TABS
20.0000 mg | ORAL_TABLET | Freq: Two times a day (BID) | ORAL | Status: DC
  Administered 2017-09-07 – 2017-09-13 (×12): 20 mg via ORAL
  Filled 2017-09-07 (×12): qty 1

## 2017-09-07 NOTE — Progress Notes (Signed)
PHARMACIST - PHYSICIAN COMMUNICATION CONCERNING:  Ertapenem   RECOMMENDATION: Ertapenm Day # 6 -- > add stop date or discontinue?, if appropriate  DESCRIPTION: Diverticulitis with abscess and peritonitis s/p surgery  Thank you Okey RegalLisa Khadeem Rockett, PharmD [phone]

## 2017-09-07 NOTE — Progress Notes (Signed)
PROGRESS NOTE    Kenneth Rios   UEA:[id number]  DOB: June 15, 1966  DOA: 09/01/2017 PCP: Patient, No Pcp Per   Brief Narrative:  Kenneth Rios is a 52 y.o. male with medical history significant of CHF, DM2, HTN, diverticulitis.  Patient was admitted from 1/16 to 1/22.  Initially for CHF exacerbation.  During that admit he developed onset of ABD pain determined on 1/21 to be due to recurrent diverticulitis.  Previously hospitalized for diverticulitis in 2015. Returns to the ED with c/o worsening abd pain.  Severe.  LLQ. Cramping.  Nothing makes it better or worse.   Subjective: Pain in suprapubic area is like a constant ache. No nausea or vomiting. Has stool in colostomy.    Assessment & Plan:   Principal Problem:   Acute diverticulitis with abscess and Peritonitis   - management by surgery -  1/28- s/p Hartman's colectomy and colostomy  - advance diet per surgery - cut back IVF - wound vac in lower abdomen to be changed today- home soon?  - antibiotic management per surgery   Active Problems: Dehydration - holding Lasix- has resolved      Diabetes mellitus without complication  - cont SSI- resumed Metformin      Hypertension -    cont Coreg and Lisinopril  DVT prophylaxis: Lovenox Code Status: Full code Family Communication: mother Disposition Plan:  Consultants:   gen sugery Procedures:    Antimicrobials:  Anti-infectives (From admission, onward)   Start     Dose/Rate Route Frequency Ordered Stop   09/02/17 0200  ertapenem (INVANZ) 1 g in sodium chloride 0.9 % 50 mL IVPB     1 g 100 mL/hr over 30 Minutes Intravenous Every 24 hours 09/02/17 0139     09/02/17 0115  vancomycin (VANCOCIN) IVPB 1000 mg/200 mL premix     1,000 mg 200 mL/hr over 60 Minutes Intravenous  Once 09/02/17 0114 09/02/17 0221   09/02/17 0115  piperacillin-tazobactam (ZOSYN) IVPB 3.375 g  Status:  Discontinued     3.375 g 100 mL/hr over 30 Minutes Intravenous  Once 09/02/17 0114  09/02/17 0139       Objective: Vitals:   09/06/17 1041 09/06/17 1405 09/06/17 2119 09/07/17 0636  BP: (!) 159/86 (!) 154/83 (!) 142/76 138/81  Pulse: 78 75 79 83  Resp:  17 18   Temp:  98.6 F (37 C) 98.1 F (36.7 C) 97.8 F (36.6 C)  TempSrc:  Oral Oral Oral  SpO2:  100% 98% 99%  Weight:    99.3 kg (218 lb 14.4 oz)  Height:        Intake/Output Summary (Last 24 hours) at 09/07/2017 1124 Last data filed at 09/07/2017 0645 Gross per 24 hour  Intake 222 ml  Output 2420 ml  Net -2198 ml   Filed Weights   09/07/17 0636  Weight: 99.3 kg (218 lb 14.4 oz)    Examination: General exam: Appears comfortable  HEENT: PERRLA, oral mucosa moist, no sclera icterus or thrush Respiratory system: Clear to auscultation. Respiratory effort normal. Cardiovascular system: S1 & S2 heard, RRR.  No murmurs  Gastrointestinal system: Abdomen soft, mild- mod tenderness across lower abdomen, nondistended. Normal bowel sound. No organomegaly colostomy bag present, wound vac present.  Central nervous system: Alert and oriented. No focal neurological deficits. Extremities: No cyanosis, clubbing or edema Skin: No rashes or ulcers Psychiatry:  Mood & affect appropriate.     Data Reviewed: I have personally reviewed following labs and imaging studies  CBC:  Recent Labs  Lab 09/03/17 0350 09/04/17 0255 09/04/17 2343 09/06/17 0751 09/07/17 0417  WBC 7.6 6.8 6.8 10.0 8.6  HGB 12.9* 12.4* 12.0* 12.4* 10.8*  HCT 38.6* 36.4* 34.8* 34.6* 31.8*  MCV 83.4 82.0 80.4 80.5 81.1  PLT 391 417* 415* 431* 390   Basic Metabolic Panel: Recent Labs  Lab 09/01/17 1724 09/03/17 0350 09/04/17 0255 09/04/17 2343 09/07/17 0417  NA 137 137 133* 134* 136  K 4.1 4.3 4.0 4.0 4.2  CL 104 108 103 104 105  CO2 22 23 23 22 25   GLUCOSE 136* 74 94 99 115*  BUN 17 10 9 6 11   CREATININE 1.14 0.97 0.88 0.90 0.97  CALCIUM 8.2* 8.1* 7.8* 7.7* 8.0*   GFR: Estimated Creatinine Clearance: 104.6 mL/min (by C-G  formula based on SCr of 0.97 mg/dL). Liver Function Tests: Recent Labs  Lab 09/01/17 1724  AST 20  ALT 17  ALKPHOS 48  BILITOT 0.9  PROT 6.4*  ALBUMIN 3.1*   Recent Labs  Lab 09/01/17 1724  LIPASE 21   No results for input(s): AMMONIA in the last 168 hours. Coagulation Profile: No results for input(s): INR, PROTIME in the last 168 hours. Cardiac Enzymes: No results for input(s): CKTOTAL, CKMB, CKMBINDEX, TROPONINI in the last 168 hours. BNP (last 3 results) No results for input(s): PROBNP in the last 8760 hours. HbA1C: No results for input(s): HGBA1C in the last 72 hours. CBG: Recent Labs  Lab 09/06/17 1640 09/06/17 2004 09/07/17 0008 09/07/17 0401 09/07/17 0801  GLUCAP 148* 150* 117* 135* 83   Lipid Profile: No results for input(s): CHOL, HDL, LDLCALC, TRIG, CHOLHDL, LDLDIRECT in the last 72 hours. Thyroid Function Tests: No results for input(s): TSH, T4TOTAL, FREET4, T3FREE, THYROIDAB in the last 72 hours. Anemia Panel: No results for input(s): VITAMINB12, FOLATE, FERRITIN, TIBC, IRON, RETICCTPCT in the last 72 hours. Urine analysis:    Component Value Date/Time   COLORURINE AMBER (A) 09/01/2017 1730   APPEARANCEUR CLEAR 09/01/2017 1730   APPEARANCEUR Clear 12/11/2013 0100   LABSPEC 1.036 (H) 09/01/2017 1730   LABSPEC 1.054 12/11/2013 0100   PHURINE 7.0 09/01/2017 1730   GLUCOSEU NEGATIVE 09/01/2017 1730   GLUCOSEU Negative 12/11/2013 0100   HGBUR NEGATIVE 09/01/2017 1730   BILIRUBINUR SMALL (A) 09/01/2017 1730   BILIRUBINUR Negative 12/11/2013 0100   KETONESUR NEGATIVE 09/01/2017 1730   PROTEINUR 30 (A) 09/01/2017 1730   NITRITE NEGATIVE 09/01/2017 1730   LEUKOCYTESUR SMALL (A) 09/01/2017 1730   LEUKOCYTESUR Negative 12/11/2013 0100   Sepsis Labs: @LABRCNTIP (procalcitonin:4,lacticidven:4) ) Recent Results (from the past 240 hour(s))  MRSA PCR Screening     Status: None   Collection Time: 09/05/17 10:03 AM  Result Value Ref Range Status   MRSA by  PCR NEGATIVE NEGATIVE Final    Comment:        The GeneXpert MRSA Assay (FDA approved for NASAL specimens only), is one component of a comprehensive MRSA colonization surveillance program. It is not intended to diagnose MRSA infection nor to guide or monitor treatment for MRSA infections.          Radiology Studies: No results found.    Scheduled Meds: . acetaminophen  1,000 mg Oral Q8H  . carvedilol  10 mg Oral Daily  . enoxaparin (LOVENOX) injection  40 mg Subcutaneous Q24H  . famotidine  20 mg Oral BID  . feeding supplement (GLUCERNA SHAKE)  237 mL Oral TID BM  . insulin aspart  0-9 Units Subcutaneous TID WC  . ketorolac  15 mg Intravenous Q6H  . lisinopril  10 mg Oral Daily  . metFORMIN  500 mg Oral BID WC   Continuous Infusions: . sodium chloride 10 mL/hr at 09/07/17 1000  . ertapenem Stopped (09/07/17 0329)  . lactated ringers Stopped (09/05/17 1440)     LOS: 5 days    Time spent in minutes: 35    Calvert Cantor, MD [facility] Pager: [email or web address] Password [facility] 09/07/2017, 11:24 AM

## 2017-09-07 NOTE — Consult Note (Signed)
WOC Nurse ostomy follow up Stoma type/location: LLQ end colostomy Stomal assessment/size: 1 3/4" pouch cut off center to avoid midline Fiancee at bedside to learn and assist with care.  Stressed to patient that he would learn this skill for self care once he felt better.  Peristomal assessment: intact midline incision.  Used barrier ring due to close proximity of NPWT dressing.  Treatment options for stomal/peristomal skin: barrier ring Output soft brown stool in pouch Ostomy pouching: 2pc. 2 3/4" pouch with barrie rring  Education provided: Fiancee and patient observed pouch change.  Steffanie RainwaterFiancee understands rationale for barrier ring and off center opening.  Applied pouch without incident.  Discussed twice weekly pouch changes but that may do Mon/Wed/Fri pouch changes while NPWT in place. Discussed activity and showering.  Patient uncomfortable with new ostomy. Emotional support provided.  Discussed that he will be able to enjoy activities once he has healed.  He understands the risk of hernia and will adhere to weight restriction with lifting.  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 He is unsure of his future with this occupation. He may apply for disability he states.  HE currently is uninsured and we discussed the secure start program for ostomy supplies for the uninsured.  Information is provided.  Enrolled patient in [employer] [facility] Discharge program: Yes WOC team will follow.   WOC Nurse wound consult note Reason for Consult:MIdline abdominal wound with NPWT (VAC) therapy Wound type:surgical Pressure Injury POA: NA Measurement: 14 cm x 4 cm x 2.4 cm  Wound UJW:JXBJYbed:beefy red, bleeds some with dressing change.  Pressure applied Drainage (amount, consistency, odor) Serosanguinous drainage in canister and bleeding with wound care.  Periwound:intact  LLQ colostomy Dressing procedure/placement/frequency:1 piece black foam used.  Change MOn/Wed/Fri  Seal  immediately achieved.   Maple HudsonKaren Iya Hamed RN BSN CWON Pager [phone]

## 2017-09-07 NOTE — Progress Notes (Signed)
  Progress Note: General Surgery Service   Assessment/Plan: Patient Active Problem List   Diagnosis Date Noted  . Peritonitis (HCC) 09/02/2017  . Acute diverticulitis 08/29/2017  . Asthma with status asthmaticus   . Diabetes mellitus without complication (HCC) 08/24/2017  . CHF, acute on chronic (HCC) 08/24/2017  . Hypertension 08/24/2017  . Acute viral bronchitis 08/24/2017  . Hypertensive urgency 08/24/2017   s/p Procedure(s): HARTMAN'S COLECTOMY AND COLOSTOMY 09/05/2017 -positive ostomy output -advance diet -ambulate -ostomy nurse to change vac today -pain control    LOS: 5 days  Chief Complaint/Subjective: Tolerating liquids, some burning at his penis  Objective: Vital signs in last 24 hours: Temp:  [97.8 F (36.6 C)-98.6 F (37 C)] 97.8 F (36.6 C) (01/30 0636) Pulse Rate:  [75-83] 83 (01/30 0636) Resp:  [17-18] 18 (01/29 2119) BP: (138-159)/(76-86) 138/81 (01/30 0636) SpO2:  [98 %-100 %] 99 % (01/30 0636) Weight:  [99.3 kg (218 lb 14.4 oz)] 99.3 kg (218 lb 14.4 oz) (01/30 0636) Last BM Date: 08/30/17  Intake/Output from previous day: 01/29 0701 - 01/30 0700 In: 222 [P.O.:222] Out: 3595 [Urine:3175; Stool:420] Intake/Output this shift: No intake/output data recorded.  Lungs: CTAB  Cardiovascular: RRR  Abd: soft, ATTP, ND, osomy pionk with liquid stool and air in bag  Extremities: no edema  Neuro: AOx4  Lab Results: CBC  Recent Labs    09/06/17 0751 09/07/17 0417  WBC 10.0 8.6  HGB 12.4* 10.8*  HCT 34.6* 31.8*  PLT 431* 390   BMET Recent Labs    09/04/17 2343 09/07/17 0417  NA 134* 136  K 4.0 4.2  CL 104 105  CO2 22 25  GLUCOSE 99 115*  BUN 6 11  CREATININE 0.90 0.97  CALCIUM 7.7* 8.0*   PT/INR No results for input(s): LABPROT, INR in the last 72 hours. ABG No results for input(s): PHART, HCO3 in the last 72 hours.  Invalid input(s): PCO2, PO2  Studies/Results:  Anti-infectives: Anti-infectives (From admission, onward)   Start     Dose/Rate Route Frequency Ordered Stop   09/02/17 0200  ertapenem (INVANZ) 1 g in sodium chloride 0.9 % 50 mL IVPB     1 g 100 mL/hr over 30 Minutes Intravenous Every 24 hours 09/02/17 0139     09/02/17 0115  vancomycin (VANCOCIN) IVPB 1000 mg/200 mL premix     1,000 mg 200 mL/hr over 60 Minutes Intravenous  Once 09/02/17 0114 09/02/17 0221   09/02/17 0115  piperacillin-tazobactam (ZOSYN) IVPB 3.375 g  Status:  Discontinued     3.375 g 100 mL/hr over 30 Minutes Intravenous  Once 09/02/17 0114 09/02/17 0139      Medications: Scheduled Meds: . acetaminophen  1,000 mg Oral Q8H  . carvedilol  10 mg Oral Daily  . enoxaparin (LOVENOX) injection  40 mg Subcutaneous Q24H  . feeding supplement (GLUCERNA SHAKE)  237 mL Oral TID BM  . insulin aspart  0-9 Units Subcutaneous TID WC  . ketorolac  15 mg Intravenous Q6H  . lisinopril  10 mg Oral Daily  . metFORMIN  500 mg Oral BID WC   Continuous Infusions: . sodium chloride 75 mL/hr at 09/06/17 1429  . ertapenem Stopped (09/07/17 0329)  . famotidine (PEPCID) IV Stopped (09/06/17 2220)  . lactated ringers Stopped (09/05/17 1440)   PRN Meds:.HYDROmorphone (DILAUDID) injection, ondansetron **OR** ondansetron (ZOFRAN) IV, oxyCODONE  Rodman PickleLuke Aaron Levaeh Vice, MD Pg# [phone] [facility]

## 2017-09-08 LAB — GLUCOSE, CAPILLARY
GLUCOSE-CAPILLARY: 115 mg/dL — AB (ref 65–99)
GLUCOSE-CAPILLARY: 132 mg/dL — AB (ref 65–99)
GLUCOSE-CAPILLARY: 87 mg/dL (ref 65–99)
Glucose-Capillary: 92 mg/dL (ref 65–99)
Glucose-Capillary: 113 mg/dL — ABNORMAL HIGH (ref 65–99)
Glucose-Capillary: 88 mg/dL (ref 65–99)

## 2017-09-08 NOTE — Progress Notes (Addendum)
PROGRESS NOTE    Kenneth Rios   ZOX:[id number]  DOB: 30-Mar-1966  DOA: 09/01/2017 PCP: Patient, No Pcp Per   Brief Narrative:  Kenneth Rios is a 52 y.o. male with medical history significant of CHF, DM2, HTN, diverticulitis.  Patient was admitted from 1/16 to 1/22.  Initially for CHF exacerbation.  During that admit he developed onset of ABD pain determined on 1/21 to be due to recurrent diverticulitis.  Previously hospitalized for diverticulitis in 2015. Returns to the ED with c/o worsening abd pain.  Severe.  LLQ. Cramping.  Nothing makes it better or worse.   Subjective: Pt seen and examined at his bedside. Has no new complaints. Abdominal; pain is achy 5-6/10 and non radiating. No nausea, no chest pain or dyspnea.     Assessment & Plan:   Principal Problem:   Acute diverticulitis with abscess and Peritonitis   - management by surgery -  1/28- s/p Hartman's colectomy and colostomy  - advance diet per surgery - cut back IVF - wound vac in lower abdomen changed 09/07/17 - antibiotic management per surgery  Chronic HFREF 45-50% -2D echo 08/27/17 revealed LVEF 45-50% with inferior basal hypokinesis -will need to follow up with cardiology in the outpatient setting -continue coreg, lisinopril   Active Problems: Dehydration - holding Lasix- has resolved      Diabetes mellitus without complication  - cont SSI- resumed Metformin      Hypertension -    cont Coreg and Lisinopril  GERD -famotidine  DVT prophylaxis: sq Lovenox Code Status: Full code Family Communication: none at bedside Disposition Plan: Home when stable Consultants:   gen sugery Procedures:    Antimicrobials:  Anti-infectives (From admission, onward)   Start     Dose/Rate Route Frequency Ordered Stop   09/02/17 0200  ertapenem (INVANZ) 1 g in sodium chloride 0.9 % 50 mL IVPB     1 g 100 mL/hr over 30 Minutes Intravenous Every 24 hours 09/02/17 0139     09/02/17 0115  vancomycin (VANCOCIN)  IVPB 1000 mg/200 mL premix     1,000 mg 200 mL/hr over 60 Minutes Intravenous  Once 09/02/17 0114 09/02/17 0221   09/02/17 0115  piperacillin-tazobactam (ZOSYN) IVPB 3.375 g  Status:  Discontinued     3.375 g 100 mL/hr over 30 Minutes Intravenous  Once 09/02/17 0114 09/02/17 0139       Objective: Vitals:   09/07/17 2205 09/07/17 2206 09/07/17 2300 09/08/17 0629  BP: (!) 158/105 (!) 150/104 (!) 146/88 (!) 169/90  Pulse: 85 83  90  Resp: 16   16  Temp: 97.7 F (36.5 C)     TempSrc:      SpO2: 100% 100%  97%  Weight:      Height:        Intake/Output Summary (Last 24 hours) at 09/08/2017 0811 Last data filed at 09/08/2017 0654 Gross per 24 hour  Intake 50 ml  Output 1000 ml  Net -950 ml   Filed Weights   09/07/17 0636  Weight: 99.3 kg (218 lb 14.4 oz)    Examination:09/08/17 patient seen and examined  General exam: Appears comfortable  HEENT: PERRLA, oral mucosa moist, no sclera icterus or thrush Respiratory system: Clear to auscultation. Respiratory effort normal. Cardiovascular system: S1 & S2 heard, RRR.  No murmurs  Gastrointestinal system: Abdomen soft, mild- mod tenderness across lower abdomen, nondistended. Normal bowel sound. colostomy bag present with stools present, wound vac present.  Central nervous system: Alert and oriented. No  focal neurological deficits. Extremities: No cyanosis, clubbing or edema Skin: No rashes or ulcers Psychiatry:  Mood & affect appropriate.     Data Reviewed: I have personally reviewed following labs and imaging studies  CBC: Recent Labs  Lab 09/03/17 0350 09/04/17 0255 09/04/17 2343 09/06/17 0751 09/07/17 0417  WBC 7.6 6.8 6.8 10.0 8.6  HGB 12.9* 12.4* 12.0* 12.4* 10.8*  HCT 38.6* 36.4* 34.8* 34.6* 31.8*  MCV 83.4 82.0 80.4 80.5 81.1  PLT 391 417* 415* 431* 390   Basic Metabolic Panel: Recent Labs  Lab 09/01/17 1724 09/03/17 0350 09/04/17 0255 09/04/17 2343 09/07/17 0417  NA 137 137 133* 134* 136  K 4.1 4.3 4.0  4.0 4.2  CL 104 108 103 104 105  CO2 22 23 23 22 25   GLUCOSE 136* 74 94 99 115*  BUN 17 10 9 6 11   CREATININE 1.14 0.97 0.88 0.90 0.97  CALCIUM 8.2* 8.1* 7.8* 7.7* 8.0*   GFR: Estimated Creatinine Clearance: 104.6 mL/min (by C-G formula based on SCr of 0.97 mg/dL). Liver Function Tests: Recent Labs  Lab 09/01/17 1724  AST 20  ALT 17  ALKPHOS 48  BILITOT 0.9  PROT 6.4*  ALBUMIN 3.1*   Recent Labs  Lab 09/01/17 1724  LIPASE 21   No results for input(s): AMMONIA in the last 168 hours. Coagulation Profile: No results for input(s): INR, PROTIME in the last 168 hours. Cardiac Enzymes: No results for input(s): CKTOTAL, CKMB, CKMBINDEX, TROPONINI in the last 168 hours. BNP (last 3 results) No results for input(s): PROBNP in the last 8760 hours. HbA1C: No results for input(s): HGBA1C in the last 72 hours. CBG: Recent Labs  Lab 09/07/17 1246 09/07/17 1723 09/07/17 2207 09/08/17 0024 09/08/17 0628  GLUCAP 131* 91 104* 115* 87   Lipid Profile: No results for input(s): CHOL, HDL, LDLCALC, TRIG, CHOLHDL, LDLDIRECT in the last 72 hours. Thyroid Function Tests: No results for input(s): TSH, T4TOTAL, FREET4, T3FREE, THYROIDAB in the last 72 hours. Anemia Panel: No results for input(s): VITAMINB12, FOLATE, FERRITIN, TIBC, IRON, RETICCTPCT in the last 72 hours. Urine analysis:    Component Value Date/Time   COLORURINE AMBER (A) 09/01/2017 1730   APPEARANCEUR CLEAR 09/01/2017 1730   APPEARANCEUR Clear 12/11/2013 0100   LABSPEC 1.036 (H) 09/01/2017 1730   LABSPEC 1.054 12/11/2013 0100   PHURINE 7.0 09/01/2017 1730   GLUCOSEU NEGATIVE 09/01/2017 1730   GLUCOSEU Negative 12/11/2013 0100   HGBUR NEGATIVE 09/01/2017 1730   BILIRUBINUR SMALL (A) 09/01/2017 1730   BILIRUBINUR Negative 12/11/2013 0100   KETONESUR NEGATIVE 09/01/2017 1730   PROTEINUR 30 (A) 09/01/2017 1730   NITRITE NEGATIVE 09/01/2017 1730   LEUKOCYTESUR SMALL (A) 09/01/2017 1730   LEUKOCYTESUR Negative  12/11/2013 0100   Sepsis Labs: @LABRCNTIP (procalcitonin:4,lacticidven:4) ) Recent Results (from the past 240 hour(s))  MRSA PCR Screening     Status: None   Collection Time: 09/05/17 10:03 AM  Result Value Ref Range Status   MRSA by PCR NEGATIVE NEGATIVE Final    Comment:        The GeneXpert MRSA Assay (FDA approved for NASAL specimens only), is one component of a comprehensive MRSA colonization surveillance program. It is not intended to diagnose MRSA infection nor to guide or monitor treatment for MRSA infections.          Radiology Studies: No results found.    Scheduled Meds: . acetaminophen  1,000 mg Oral Q8H  . carvedilol  10 mg Oral Daily  . enoxaparin (LOVENOX) injection  40  mg Subcutaneous Q24H  . famotidine  20 mg Oral BID  . feeding supplement (GLUCERNA SHAKE)  237 mL Oral TID BM  . insulin aspart  0-9 Units Subcutaneous TID WC  . ketorolac  15 mg Intravenous Q6H  . lisinopril  10 mg Oral Daily  . metFORMIN  500 mg Oral BID WC   Continuous Infusions: . sodium chloride 10 mL/hr at 09/07/17 1820  . ertapenem Stopped (09/08/17 0322)  . lactated ringers Stopped (09/05/17 1440)     LOS: 6 days    Time spent in minutes: 35    Darlin Drop, MD [facility] Pager: [email or web address] Password [email or web address] 09/08/2017, 8:11 AM

## 2017-09-08 NOTE — Progress Notes (Signed)
  Progress Note: General Surgery Service   Assessment/Plan: Patient Active Problem List   Diagnosis Date Noted  . Peritonitis (HCC) 09/02/2017  . Acute diverticulitis 08/29/2017  . Asthma with status asthmaticus   . Diabetes mellitus without complication (HCC) 08/24/2017  . CHF, acute on chronic (HCC) 08/24/2017  . Hypertension 08/24/2017  . Acute viral bronchitis 08/24/2017  . Hypertensive urgency 08/24/2017   s/p Procedure(s): HARTMAN'S COLECTOMY AND COLOSTOMY 09/05/2017 Bloating sensation overnight and after food, burning in penis and bladder pressure improved -continue diet, continue ambulation -discussed pathology with patient -if bloating/nausea worsens will get XR for possible ileus, but he is not very distended and still having bowel function   LOS: 6 days  Chief Complaint/Subjective: Bloating and nausea with food  Objective: Vital signs in last 24 hours: Temp:  [97.7 F (36.5 C)] 97.7 F (36.5 C) (01/30 2205) Pulse Rate:  [74-90] 90 (01/31 0629) Resp:  [16-20] 16 (01/31 0629) BP: (129-169)/(78-105) 169/90 (01/31 0629) SpO2:  [97 %-100 %] 97 % (01/31 0629) Last BM Date: 08/30/17  Intake/Output from previous day: 01/30 0701 - 01/31 0700 In: 50 [IV Piggyback:50] Out: 1000 [Urine:1000] Intake/Output this shift: No intake/output data recorded.  Lungs: CTAB  Cardiovascular: RRR  Abd: soft, ATTP, ND, ostomy pink patent, vac in place  Extremities: no edema  Neuro: AOx4  Lab Results: CBC  Recent Labs    09/06/17 0751 09/07/17 0417  WBC 10.0 8.6  HGB 12.4* 10.8*  HCT 34.6* 31.8*  PLT 431* 390   BMET Recent Labs    09/07/17 0417  NA 136  K 4.2  CL 105  CO2 25  GLUCOSE 115*  BUN 11  CREATININE 0.97  CALCIUM 8.0*   PT/INR No results for input(s): LABPROT, INR in the last 72 hours. ABG No results for input(s): PHART, HCO3 in the last 72 hours.  Invalid input(s): PCO2, PO2  Studies/Results:  Anti-infectives: Anti-infectives (From  admission, onward)   Start     Dose/Rate Route Frequency Ordered Stop   09/02/17 0200  ertapenem (INVANZ) 1 g in sodium chloride 0.9 % 50 mL IVPB     1 g 100 mL/hr over 30 Minutes Intravenous Every 24 hours 09/02/17 0139     09/02/17 0115  vancomycin (VANCOCIN) IVPB 1000 mg/200 mL premix     1,000 mg 200 mL/hr over 60 Minutes Intravenous  Once 09/02/17 0114 09/02/17 0221   09/02/17 0115  piperacillin-tazobactam (ZOSYN) IVPB 3.375 g  Status:  Discontinued     3.375 g 100 mL/hr over 30 Minutes Intravenous  Once 09/02/17 0114 09/02/17 0139      Medications: Scheduled Meds: . acetaminophen  1,000 mg Oral Q8H  . carvedilol  10 mg Oral Daily  . enoxaparin (LOVENOX) injection  40 mg Subcutaneous Q24H  . famotidine  20 mg Oral BID  . feeding supplement (GLUCERNA SHAKE)  237 mL Oral TID BM  . insulin aspart  0-9 Units Subcutaneous TID WC  . ketorolac  15 mg Intravenous Q6H  . lisinopril  10 mg Oral Daily  . metFORMIN  500 mg Oral BID WC   Continuous Infusions: . sodium chloride 10 mL/hr at 09/07/17 1820  . ertapenem Stopped (09/08/17 0322)  . lactated ringers Stopped (09/05/17 1440)   PRN Meds:.HYDROmorphone (DILAUDID) injection, ondansetron **OR** ondansetron (ZOFRAN) IV, oxyCODONE  Rodman PickleLuke Aaron Shainna Faux, MD Pg# [phone] [facility]

## 2017-09-08 NOTE — Discharge Instructions (Addendum)
[facility]      [facility] °[phone] ° °OPEN ABDOMINAL SURGERY: POST OP INSTRUCTIONS ° °Always review your discharge instruction sheet given to you by the facility where your surgery was performed. ° °IF YOU HAVE DISABILITY OR FAMILY LEAVE FORMS, YOU MUST BRING THEM TO THE OFFICE FOR PROCESSING.  PLEASE DO NOT GIVE THEM TO YOUR DOCTOR. ° °1. A prescription for pain medication may be given to you upon discharge.  Take your pain medication as prescribed, if needed.  If narcotic pain medicine is not needed, then you may take acetaminophen (Tylenol) or ibuprofen (Advil) as needed. °2. Take your usually prescribed medications unless otherwise directed. °3. If you need a refill on your pain medication, please contact your pharmacy. They will contact our office to request authorization.  Prescriptions will not be filled after 5pm or on week-ends. °4. You should follow a light diet the first few days after arrival home, such as soup and crackers, pudding, etc.unless your doctor has advised otherwise. A high-fiber, low fat diet can be resumed as tolerated.   Be sure to include lots of fluids daily. Most patients will experience some swelling and bruising on the chest and neck area.  Ice packs will help.  Swelling and bruising can take several days to resolve °5. Most patients will experience some swelling and bruising in the area of the incision. Ice pack will help. Swelling and bruising can take several days to resolve..  °6. It is common to experience some constipation if taking pain medication after surgery.  Increasing fluid intake and taking a stool softener will usually help or prevent this problem from occurring.  A mild laxative (Milk of Magnesia or Miralax) should be taken according to package directions if there are no bowel movements after 48 hours. °7.  You may have steri-strips (small skin tapes) in place directly over the incision.  These strips should be left on the skin for 7-10 days.  If your  surgeon used skin glue on the incision, you may shower in 24 hours.  The glue will flake off over the next 2-3 weeks.  Any sutures or staples will be removed at the office during your follow-up visit. You may find that a light gauze bandage over your incision may keep your staples from being rubbed or pulled. You may shower and replace the bandage daily. °8. ACTIVITIES:  You may resume regular (light) daily activities beginning the next day--such as daily self-care, walking, climbing stairs--gradually increasing activities as tolerated.  You may have sexual intercourse when it is comfortable.  Refrain from any heavy lifting or straining until approved by your doctor. °a. You may drive when you no longer are taking prescription pain medication, you can comfortably wear a seatbelt, and you can safely maneuver your car and apply brakes °b. Return to Work: ___________________________________ °9. You should see your doctor in the office for a follow-up appointment approximately two weeks after your surgery.  Make sure that you call for this appointment within a day or two after you arrive home to insure a convenient appointment time. °OTHER INSTRUCTIONS:  °_____________________________________________________________ °_____________________________________________________________ ° °WHEN TO CALL YOUR DOCTOR: °1. Fever over 101.0 °2. Inability to urinate °3. Nausea and/or vomiting °4. Extreme swelling or bruising °5. Continued bleeding from incision. °6. Increased pain, redness, or drainage from the incision. °7. Difficulty swallowing or breathing °8. Muscle cramping or spasms. °9. Numbness or tingling in hands or feet or around lips. ° °The clinic staff is available to   answer your questions during regular business hours.  Please don’t hesitate to call and ask to speak to one of the nurses if you have concerns. ° °For further questions, please visit [email or web address] ° ° °Colostomy, Adult, Care After °Refer to  this sheet in the next few weeks. These instructions provide you with information about caring for yourself after your procedure. Your health care provider may also give you more specific instructions. Your treatment has been planned according to current medical practices, but problems sometimes occur. Call your health care provider if you have any problems or questions after your procedure. °What can I expect after the procedure? °After the procedure, it is common to have: °· Swelling at the opening that was created during the procedure (stoma). °· Slight bleeding around the stoma. °· Redness around the stoma. ° °Follow these instructions at home: °Activity °· Rest as needed while the stoma area heals. °· Return to your normal activities as told by your health care provider. Ask your health care provider what activities are safe for you. °· Avoid strenuous activity and abdominal exercises for 3 weeks or for as long as told by your health care provider. °· Do not lift anything that is heavier than 10 lb (4.5 kg). °Incision care ° °· Follow instructions from your health care provider about how to take care of your incision. Make sure you: °? Wash your hands with soap and water before you change your bandage (dressing). If soap and water are not available, use hand sanitizer. °? Change your dressing as told by your health care provider. °? Leave stitches (sutures), skin glue, or adhesive strips in place. These skin closures may need to stay in place for 2 weeks or longer. If adhesive strip edges start to loosen and curl up, you may trim the loose edges. Do not remove adhesive strips completely unless your health care provider tells you to do that. °Stoma Care °· Keep the stoma area clean. °· Clean and dry the skin around the stoma each time you change the colostomy bag. To clean the stoma area: °? Use warm water and only use cleansers that are recommended by your health care provider. °? Rinse the stoma area with  plain water. °? Dry the area well. °· Use stoma powder or ointment on your skin only as told by your health care provider. Do not use any other powders, gels, wipes, or creams on your skin. °· Check the stoma area every day for signs of infection. Check for: °? More redness, swelling, or pain. °? More fluid or blood. °? Pus or warmth. °· Measure the stoma opening regularly and record the size. Watch for changes. Share this information with your health care provider. °Bathing °· Do not take baths, swim, or use a hot tub until your health care provider approves. Ask your health care provider if you can take showers. You may be able to shower with or without the colostomy bag in place. If you bathe with the bag on, dry the bag afterward. °· Avoid using harsh or oily soaps when you bathe. °Colostomy Bag Care °· Follow instructions from your health care provider about how to empty or change the colostomy bag. °· Keep colostomy supplies with you at all times. °· Store all supplies in a cool, dry place. °· Empty the colostomy bag: °? Whenever it is one-third to one-half full. °? At bedtime. °· Replace the bag every 2-4 days or as told by your health care provider. °Driving °·   Driving  Do not drive for 24 hours if you received a sedative.  Do not drive or operate heavy machinery while taking prescription pain medicine. General instructions  Follow instructions from your health care provider about eating or drinking restrictions.  Take over-the-counter and prescription medicines only as told by your health care provider.  Avoid wearing clothes that are tight directly over your stoma.  Do not use any tobacco products, such as cigarettes, chewing tobacco, and e-cigarettes. If you need help quitting, ask your health care provider.  (Women) Ask your health care provider about becoming pregnant and about using birth control. Medicines may not be absorbed normally after the procedure.  Keep all follow-up  visits as told by your health care provider. This is important. Contact a health care provider if:  You are having trouble caring for your stoma or changing the colostomy bag.  You feel nauseous or you vomit.  You have a fever.  You havemore redness, swelling, or pain at the site of your stoma or around your anus.  You have more fluid or blood coming from your stoma or your anus.  Your stoma area feels warm to the touch.  You have pus coming from your stoma.  You notice a change in the size or appearance of the stoma.  You have abdominal pain, bloating, pressure, or cramping.  Your have stool more often or less often than your health care provider tells you to expect.  You are not making much urine. This may be a sign of dehydration. Get help right away if:  Your abdominal pain does not go away or it becomes severe.  You keep vomiting.  Your stool is not draining through the stoma.  You have chest pain or an irregular heartbeat. This information is not intended to replace advice given to you by your health care provider. Make sure you discuss any questions you have with your health care provider. Document Released: 12/16/2010 Document Revised: 12/04/2015 Document Reviewed: 04/08/2015 Elsevier Interactive Patient Education  2018 [employer]   MIDLINE WOUND CARE: - midline dressing to be changed twice daily - supplies: sterile saline, kerlix, scissors, ABD pads, tape  - remove dressing and all packing carefully, moistening with sterile saline as needed to avoid packing/internal dressing sticking to the wound. - clean edges of skin around the wound with water/gauze, making sure there is no tape debris or leakage left on skin that could cause skin irritation or breakdown. - dampen and clean kerlix with sterile saline and pack wound from wound base to skin level, making sure to take note of any possible areas of wound tracking, tunneling and packing appropriately. Wound can  be packed loosely. Trim kerlix to size if a whole kerlix is not required. - cover wound with a dry ABD pad and secure with tape.  - write the date/time on the dry dressing/tape to better track when the last dressing change occurred. - apply any skin protectant/powder recommended by clinician to protect skin/skin folds. - change dressing as needed if leakage occurs, wound gets contaminated, or patient requests to shower. - patient may shower daily with wound open and following the shower the wound should be dried and a clean dressing placed.

## 2017-09-09 ENCOUNTER — Inpatient Hospital Stay (HOSPITAL_COMMUNITY): Payer: Medicaid Other

## 2017-09-09 DIAGNOSIS — I5022 Chronic systolic (congestive) heart failure: Secondary | ICD-10-CM

## 2017-09-09 DIAGNOSIS — K567 Ileus, unspecified: Secondary | ICD-10-CM

## 2017-09-09 LAB — GLUCOSE, CAPILLARY
GLUCOSE-CAPILLARY: 113 mg/dL — AB (ref 65–99)
GLUCOSE-CAPILLARY: 110 mg/dL — AB (ref 65–99)
GLUCOSE-CAPILLARY: 104 mg/dL — AB (ref 65–99)
Glucose-Capillary: 115 mg/dL — ABNORMAL HIGH (ref 65–99)
Glucose-Capillary: 88 mg/dL (ref 65–99)
Glucose-Capillary: 98 mg/dL (ref 65–99)

## 2017-09-09 MED ORDER — ATORVASTATIN CALCIUM 10 MG PO TABS
10.0000 mg | ORAL_TABLET | Freq: Every day | ORAL | Status: DC
  Administered 2017-09-09 – 2017-09-13 (×5): 10 mg via ORAL
  Filled 2017-09-09 (×5): qty 1

## 2017-09-09 MED ORDER — FUROSEMIDE 20 MG PO TABS
20.0000 mg | ORAL_TABLET | Freq: Every day | ORAL | Status: DC
  Administered 2017-09-09 – 2017-09-13 (×5): 20 mg via ORAL
  Filled 2017-09-09 (×5): qty 1

## 2017-09-09 MED ORDER — FUROSEMIDE 10 MG/ML IJ SOLN
60.0000 mg | Freq: Once | INTRAMUSCULAR | Status: AC
  Administered 2017-09-09: 60 mg via INTRAVENOUS
  Filled 2017-09-09: qty 6

## 2017-09-09 MED ORDER — SIMETHICONE 80 MG PO CHEW
80.0000 mg | CHEWABLE_TABLET | Freq: Once | ORAL | Status: AC
  Administered 2017-09-09: 80 mg via ORAL
  Filled 2017-09-09: qty 1

## 2017-09-09 NOTE — Progress Notes (Signed)
  Progress Note: General Surgery Service   Assessment/Plan: Patient Active Problem List   Diagnosis Date Noted  . Peritonitis (HCC) 09/02/2017  . Acute diverticulitis 08/29/2017  . Asthma with status asthmaticus   . Diabetes mellitus without complication (HCC) 08/24/2017  . CHF, acute on chronic (HCC) 08/24/2017  . Hypertension 08/24/2017  . Acute viral bronchitis 08/24/2017  . Hypertensive urgency 08/24/2017   s/p Procedure(s): HARTMAN'S COLECTOMY AND COLOSTOMY 09/05/2017 -XR today to eval for possible ileus -continue ambulation -continue vac and ostomy management    LOS: 7 days  Chief Complaint/Subjective: Did not eat lunch or dinner due to bloating feeling. Ambulating well  Objective: Vital signs in last 24 hours: Temp:  [97.9 F (36.6 C)-98.6 F (37 C)] 98.6 F (37 C) (02/01 0448) Pulse Rate:  [71-88] 88 (02/01 0448) Resp:  [17-18] 18 (02/01 0448) BP: (158-174)/(90-93) 174/93 (02/01 0448) SpO2:  [99 %-100 %] 99 % (02/01 0448) Last BM Date: 09/08/17  Intake/Output from previous day: 01/31 0701 - 02/01 0700 In: 600 [P.O.:600] Out: 1900 [Urine:1900] Intake/Output this shift: No intake/output data recorded.  Lungs: CTAB  Cardiovascular: RRR  Abd: soft, slight distension, ostomy pink patent with liquid stool in bag  Extremities: no edema  Neuro: AOx4  Lab Results: CBC  Recent Labs    09/07/17 0417  WBC 8.6  HGB 10.8*  HCT 31.8*  PLT 390   BMET Recent Labs    09/07/17 0417  NA 136  K 4.2  CL 105  CO2 25  GLUCOSE 115*  BUN 11  CREATININE 0.97  CALCIUM 8.0*   PT/INR No results for input(s): LABPROT, INR in the last 72 hours. ABG No results for input(s): PHART, HCO3 in the last 72 hours.  Invalid input(s): PCO2, PO2  Studies/Results:  Anti-infectives: Anti-infectives (From admission, onward)   Start     Dose/Rate Route Frequency Ordered Stop   09/02/17 0200  ertapenem (INVANZ) 1 g in sodium chloride 0.9 % 50 mL IVPB  Status:   Discontinued     1 g 100 mL/hr over 30 Minutes Intravenous Every 24 hours 09/02/17 0139 09/08/17 1038   09/02/17 0115  vancomycin (VANCOCIN) IVPB 1000 mg/200 mL premix     1,000 mg 200 mL/hr over 60 Minutes Intravenous  Once 09/02/17 0114 09/02/17 0221   09/02/17 0115  piperacillin-tazobactam (ZOSYN) IVPB 3.375 g  Status:  Discontinued     3.375 g 100 mL/hr over 30 Minutes Intravenous  Once 09/02/17 0114 09/02/17 0139      Medications: Scheduled Meds: . acetaminophen  1,000 mg Oral Q8H  . carvedilol  10 mg Oral Daily  . enoxaparin (LOVENOX) injection  40 mg Subcutaneous Q24H  . famotidine  20 mg Oral BID  . feeding supplement (GLUCERNA SHAKE)  237 mL Oral TID BM  . insulin aspart  0-9 Units Subcutaneous TID WC  . lisinopril  10 mg Oral Daily  . metFORMIN  500 mg Oral BID WC   Continuous Infusions: . sodium chloride 10 mL/hr at 09/07/17 1820  . lactated ringers Stopped (09/05/17 1440)   PRN Meds:.HYDROmorphone (DILAUDID) injection, ondansetron **OR** ondansetron (ZOFRAN) IV, oxyCODONE  Rodman PickleLuke Aaron Sneijder Bernards, MD Pg# [phone] [facility]

## 2017-09-09 NOTE — Consult Note (Signed)
  WOC Nurse ostomy follow up Stoma type/location: LLQ end colostomy Stomal assessment/size: 1 3/4" pouch cut off center to avoid midline Fiancee performed pouch change today.  Understands how to trace and cut pattern.  Rationale and how to apply barrier ring.  She has been emptying pouch and is comfortable with this.  She feels more confident after session today.   Peristomal assessment: intact midline incision.  Used barrier ring due to close proximity of NPWT dressing.  Treatment options for stomal/peristomal skin: barrier ring Output soft brown stool in pouch Ostomy pouching: 2pc. 2 3/4" pouch with barrie rring  Education provided: Fiancee performed  pouch change.  Steffanie RainwaterFiancee understands rationale for barrier ring and off center opening.  Applied pouch without incident.  Discussed twice weekly pouch changes but that may do Mon/Wed/Fri pouch changes while NPWT in place. Discussed activity and showering.  HE currently is uninsured and we discussed the secure start program for ostomy supplies for the uninsured.  Information is provided. Steffanie RainwaterFiancee has already made call to secure start to initiate indigent program for supplies.  [place] in [facility] Discharge program: Yes WOC team will follow.   WOC Nurse wound consult note Reason for Consult:MIdline abdominal wound with NPWT (VAC) therapy Wound type:surgical Pressure Injury POA: NA Measurement: 14 cm x 4 cm x 2.4 cm  Wound ZOX:WRUEAbed:beefy red, no bleeding with dressing change.   Drainage (amount, consistency, odor) Serosanguinous drainage in canister and bleeding with wound care.  Periwound:intact  LLQ colostomy Dressing procedure/placement/frequency:1 piece black foam used.  Change MOn/Wed/Fri  Seal immediately achieved. [facility] in addition to ostomy care/teaching WOC team will follow.   Maple HudsonKaren Connor Meacham RN BSN CWON Pager [phone]

## 2017-09-09 NOTE — Care Management Note (Signed)
Case Management Note  Patient Details  Name: Kenneth Rios MRN: [id number] Date of Birth: Apr 17, 1966  Subjective/Objective:      Admitted with acute diverticulitis.         s/p  open partial colectomy with end colostomy 1/28  Action/Plan: Transition to home when medically stable. Post hospital f/u to establish PCP scheduled for 09[facility]16/2017 @ 8:30 am [facility] Dr. Alvis LemmingsNewlin.  Expected Discharge Date:                  Expected Discharge Plan:  Home/Self Care  In-House Referral:     Discharge planning Services  CM Consult, Medication Assistance, Indigent Health Clinic  Post Acute Care Choice:  Home Health, Durable Medical Equipment Choice offered to:  Patient  DME Arranged:  Vac DME Agency:  [facility], pending charity approval. MD needs to fill out section 2 of VAC authorization form which is attached on front of chart. Once completed per MD form will need faxing to [facility] @ [phone], questions [phone].  HH Arranged:  RN HH Agency:  [facility],  Pending charity approval.   Status of Service:  In process, will continue to follow  If discussed at Long Length of Stay Meetings, dates discussed:    Additional Comments:  Epifanio LeschesCole, Rivers Hamrick Hudson, RN 09/09/2017, 5:12 PM

## 2017-09-09 NOTE — Progress Notes (Signed)
PROGRESS NOTE    Kenneth Rios   ZOX:[id number]  DOB: [birth year]  DOA: 09/01/2017 PCP: Patient, No Pcp Per   Brief Narrative:  Kenneth Rios is a 52 y.o. male with medical history significant of CHF, DM2, HTN, diverticulitis.  Patient was admitted from 1/16 to 1/22.  Initially for CHF exacerbation.  During that admit he developed onset of ABD pain determined on 1/21 to be due to recurrent diverticulitis.  Previously hospitalized for diverticulitis in 2015. Returns to the ED with c/o worsening abd pain.  Severe.  LLQ. Cramping.  Nothing makes it better or worse.   Subjective: 09/09/17: reports of orthopnea. 1 dose lasix given with improvement of symptoms. Denies chest pain or palpitations. Abdominal pain is improving. Stools in colostomy bag. Abd xray negative for obstruction. Good bowel sounds. Tolerates po intake well.    Assessment & Plan:   Principal Problem:   Acute diverticulitis with abscess and Peritonitis   - management by surgery -  1/28- s/p Hartman's colectomy and colostomy  - advance diet per surgery - cut back IVF - wound vac in lower abdomen changed 09/07/17 - antibiotic management per surgery - 09/09/17 abd xray negative for obstruction  Chronic HFREF 45-50% -2D echo 08/27/17 revealed LVEF 45-50% with inferior basal hypokinesis -will need to follow up with cardiology in the outpatient setting -continue coreg, lisinopril -restart lasix po daily  -IV lasix 60 mg once -monitor urine output -struict I&Os, daily weights  Active Problems: Dehydration, resolved  -tolerates po well   Diabetes mellitus without complication  - cont SSI- resumed Metformin      Hypertension -   cont Coreg and Lisinopril  GERD -famotidine  DVT prophylaxis: sq Lovenox Code Status: Full code Family Communication: wife at bedside. All questions answered. Disposition Plan: Home when stable Consultants:   gen sugery Procedures:    Antimicrobials:  Anti-infectives (From  admission, onward)   Start     Dose/Rate Route Frequency Ordered Stop   09/02/17 0200  ertapenem (INVANZ) 1 g in sodium chloride 0.9 % 50 mL IVPB  Status:  Discontinued     1 g 100 mL/hr over 30 Minutes Intravenous Every 24 hours 09/02/17 0139 09/08/17 1038   09/02/17 0115  vancomycin (VANCOCIN) IVPB 1000 mg/200 mL premix     1,000 mg 200 mL/hr over 60 Minutes Intravenous  Once 09/02/17 0114 09/02/17 0221   09/02/17 0115  piperacillin-tazobactam (ZOSYN) IVPB 3.375 g  Status:  Discontinued     3.375 g 100 mL/hr over 30 Minutes Intravenous  Once 09/02/17 0114 09/02/17 0139       Objective: Vitals:   09/08/17 2127 09/08/17 2127 09/09/17 0448 09/09/17 1559  BP: (!) 165/93 (!) 165/93 (!) 174/93 (!) 147/95  Pulse: 71 71 88 88  Resp: 18 18 18 19   Temp: 97.9 F (36.6 C) 97.9 F (36.6 C) 98.6 F (37 C) 97.9 F (36.6 C)  TempSrc: Oral Oral Oral Oral  SpO2: 100% 100% 99% 100%  Weight:      Height:        Intake/Output Summary (Last 24 hours) at 09/09/2017 1646 Last data filed at 09/09/2017 1300 Gross per 24 hour  Intake 480 ml  Output 1950 ml  Net -1470 ml   Filed Weights   09/07/17 0636  Weight: 99.3 kg (218 lb 14.4 oz)    Examination:09/09/17 patient seen and examined  General exam: Appears comfortable  HEENT: PERRLA, oral mucosa moist, no sclera icterus or thrush Respiratory system: Clear to auscultation.  Respiratory effort normal. Cardiovascular system: S1 & S2 heard, RRR.  No murmurs  Gastrointestinal system: Abdomen soft, mild- mod tenderness across lower abdomen, nondistended. Normal bowel sound. colostomy bag present with stools present, wound vac present.  Central nervous system: Alert and oriented. No focal neurological deficits. Extremities: No cyanosis, clubbing or edema Skin: No rashes or ulcers Psychiatry:  Mood & affect appropriate.     Data Reviewed: I have personally reviewed following labs and imaging studies  CBC: Recent Labs  Lab 09/03/17 0350  09/04/17 0255 09/04/17 2343 09/06/17 0751 09/07/17 0417  WBC 7.6 6.8 6.8 10.0 8.6  HGB 12.9* 12.4* 12.0* 12.4* 10.8*  HCT 38.6* 36.4* 34.8* 34.6* 31.8*  MCV 83.4 82.0 80.4 80.5 81.1  PLT 391 417* 415* 431* 390   Basic Metabolic Panel: Recent Labs  Lab 09/03/17 0350 09/04/17 0255 09/04/17 2343 09/07/17 0417  NA 137 133* 134* 136  K 4.3 4.0 4.0 4.2  CL 108 103 104 105  CO2 23 23 22 25   GLUCOSE 74 94 99 115*  BUN 10 9 6 11   CREATININE 0.97 0.88 0.90 0.97  CALCIUM 8.1* 7.8* 7.7* 8.0*   GFR: Estimated Creatinine Clearance: 104.6 mL/min (by C-G formula based on SCr of 0.97 mg/dL). Liver Function Tests: No results for input(s): AST, ALT, ALKPHOS, BILITOT, PROT, ALBUMIN in the last 168 hours. No results for input(s): LIPASE, AMYLASE in the last 168 hours. No results for input(s): AMMONIA in the last 168 hours. Coagulation Profile: No results for input(s): INR, PROTIME in the last 168 hours. Cardiac Enzymes: No results for input(s): CKTOTAL, CKMB, CKMBINDEX, TROPONINI in the last 168 hours. BNP (last 3 results) No results for input(s): PROBNP in the last 8760 hours. HbA1C: No results for input(s): HGBA1C in the last 72 hours. CBG: Recent Labs  Lab 09/09/17 0033 09/09/17 0445 09/09/17 0756 09/09/17 1146 09/09/17 1556  GLUCAP 98 88 113* 115* 110*   Lipid Profile: No results for input(s): CHOL, HDL, LDLCALC, TRIG, CHOLHDL, LDLDIRECT in the last 72 hours. Thyroid Function Tests: No results for input(s): TSH, T4TOTAL, FREET4, T3FREE, THYROIDAB in the last 72 hours. Anemia Panel: No results for input(s): VITAMINB12, FOLATE, FERRITIN, TIBC, IRON, RETICCTPCT in the last 72 hours. Urine analysis:    Component Value Date/Time   COLORURINE AMBER (A) 09/01/2017 1730   APPEARANCEUR CLEAR 09/01/2017 1730   APPEARANCEUR Clear 12/11/2013 0100   LABSPEC 1.036 (H) 09/01/2017 1730   LABSPEC 1.054 12/11/2013 0100   PHURINE 7.0 09/01/2017 1730   GLUCOSEU NEGATIVE 09/01/2017 1730    GLUCOSEU Negative 12/11/2013 0100   HGBUR NEGATIVE 09/01/2017 1730   BILIRUBINUR SMALL (A) 09/01/2017 1730   BILIRUBINUR Negative 12/11/2013 0100   KETONESUR NEGATIVE 09/01/2017 1730   PROTEINUR 30 (A) 09/01/2017 1730   NITRITE NEGATIVE 09/01/2017 1730   LEUKOCYTESUR SMALL (A) 09/01/2017 1730   LEUKOCYTESUR Negative 12/11/2013 0100   Sepsis Labs: @LABRCNTIP (procalcitonin:4,lacticidven:4) ) Recent Results (from the past 240 hour(s))  MRSA PCR Screening     Status: None   Collection Time: 09/05/17 10:03 AM  Result Value Ref Range Status   MRSA by PCR NEGATIVE NEGATIVE Final    Comment:        The GeneXpert MRSA Assay (FDA approved for NASAL specimens only), is one component of a comprehensive MRSA colonization surveillance program. It is not intended to diagnose MRSA infection nor to guide or monitor treatment for MRSA infections.          Radiology Studies: Dg Abd Portable 1v  Result  Date: 09/09/2017 CLINICAL DATA:  Abdominal distention, but tightness has improved over last 24 hr. Feels air moving proximal bowels only. Abdominal surgery with colestomy 5 days ago. EXAM: PORTABLE ABDOMEN - 1 VIEW COMPARISON:  CT, 09/02/2017.  Radiographs, 08/28/2017. FINDINGS: Normal bowel gas pattern. Left lower quadrant colostomy. No evidence of renal or ureteral stones. Soft tissues are otherwise unremarkable. No significant skeletal abnormality. IMPRESSION: 1. No acute findings. No evidence of bowel obstruction or significant generalized adynamic ileus. Electronically Signed   By: Amie Portland M.D.   On: 09/09/2017 10:43      Scheduled Meds: . acetaminophen  1,000 mg Oral Q8H  . carvedilol  10 mg Oral Daily  . enoxaparin (LOVENOX) injection  40 mg Subcutaneous Q24H  . famotidine  20 mg Oral BID  . feeding supplement (GLUCERNA SHAKE)  237 mL Oral TID BM  . insulin aspart  0-9 Units Subcutaneous TID WC  . lisinopril  10 mg Oral Daily  . metFORMIN  500 mg Oral BID WC   Continuous  Infusions: . sodium chloride 10 mL/hr at 09/07/17 1820  . lactated ringers Stopped (09/05/17 1440)     LOS: 7 days    Time spent in minutes: 35    Darlin Drop, MD [facility] Pager: [email or web address] Password [facility] 09/09/2017, 4:46 PM

## 2017-09-10 LAB — BASIC METABOLIC PANEL
Anion gap: 11 (ref 5–15)
BUN: 9 mg/dL (ref 6–20)
CHLORIDE: 99 mmol/L — AB (ref 101–111)
CO2: 26 mmol/L (ref 22–32)
Calcium: 9.3 mg/dL (ref 8.9–10.3)
Creatinine, Ser: 1.1 mg/dL (ref 0.61–1.24)
GFR calc non Af Amer: >60 mL/min (ref >60)
Glucose, Bld: 151 mg/dL — ABNORMAL HIGH (ref 65–99)
Potassium: 4.3 mmol/L (ref 3.5–5.1)
Sodium: 136 mmol/L (ref 135–145)

## 2017-09-10 LAB — GLUCOSE, CAPILLARY
GLUCOSE-CAPILLARY: 109 mg/dL — AB (ref 65–99)
GLUCOSE-CAPILLARY: 152 mg/dL — AB (ref 65–99)
Glucose-Capillary: 102 mg/dL — ABNORMAL HIGH (ref 65–99)
Glucose-Capillary: 111 mg/dL — ABNORMAL HIGH (ref 65–99)
Glucose-Capillary: 103 mg/dL — ABNORMAL HIGH (ref 65–99)
Glucose-Capillary: 106 mg/dL — ABNORMAL HIGH (ref 65–99)
Glucose-Capillary: 117 mg/dL — ABNORMAL HIGH (ref 65–99)

## 2017-09-10 LAB — CBC
HCT: 36.4 % — ABNORMAL LOW (ref 39.0–52.0)
HEMOGLOBIN: 12.3 g/dL — AB (ref 13.0–17.0)
MCH: 27.6 pg (ref 26.0–34.0)
MCHC: 33.8 g/dL (ref 30.0–36.0)
MCV: 81.8 fL (ref 78.0–100.0)
PLATELETS: 407 10*3/uL — AB (ref 150–400)
RBC: 4.45 MIL/uL (ref 4.22–5.81)
RDW: 13.1 % (ref 11.5–15.5)
WBC: 6.7 10*3/uL (ref 4.0–10.5)

## 2017-09-10 MED ORDER — METHOCARBAMOL 500 MG PO TABS
500.0000 mg | ORAL_TABLET | Freq: Three times a day (TID) | ORAL | Status: DC
  Administered 2017-09-10 – 2017-09-13 (×11): 500 mg via ORAL
  Filled 2017-09-10 (×11): qty 1

## 2017-09-10 MED ORDER — POLYETHYLENE GLYCOL 3350 17 G PO PACK
17.0000 g | PACK | Freq: Every day | ORAL | Status: DC
  Administered 2017-09-10 – 2017-09-13 (×3): 17 g via ORAL
  Filled 2017-09-10 (×4): qty 1

## 2017-09-10 NOTE — Progress Notes (Signed)
PROGRESS NOTE    Kenneth Rios   ZOX:[id number]  DOB: January 31, 1966  DOA: 09/01/2017 PCP: Patient, No Pcp Per   Brief Narrative:  Kenneth Rios is a 52 y.o. male with past medical history significant for CHF, DM2, HTN, diverticulitis.  Patient was admitted from 1/16 to 1/22, initially for CHF exacerbation.  During that admit, patient developed onset of abdominal pain determined on 1/21 to be due to recurrent diverticulitis.  Previously hospitalized for diverticulitis in 2015.  Patient returned to the hospital on 09/01/2017 with worsening abdominal pain, severe, involving the left lower abdominal quadrant.   Subjective: 09/10/17: Patient reports abdominal distention and nonspecific discomfort.  Patient seems to have ileus.  Surgical input is appreciated.  The patient has been advised to be more mobile.  We will continue to monitor for now.  No fever or chills.  No nausea.  Patient is breaking wind.  Patient has had bowel movements.     Assessment & Plan:   Principal Problem:   Acute diverticulitis with abscess and Peritonitis   - management by surgery -  1/28- s/p Hartman's colectomy and colostomy  - advance diet per surgery - cut back IVF - wound vac in lower abdomen changed 09/07/17 - antibiotic management per surgery - 09/09/17 abd xray negative for obstruction -Patient has developed ileus. -Keep potassium above 4. -Mobilize. -Repeat abdominal x-ray in the morning.  Chronic HFREF 45-50% -2D echo 08/27/17 revealed LVEF 45-50% with inferior basal hypokinesis -will need to follow up with cardiology in the outpatient setting -continue coreg, lisinopril -restart lasix po daily  -IV lasix 60 mg once -monitor urine output -struict I&Os, daily weights  Active Problems: Dehydration, resolved  -tolerates po well   Diabetes mellitus without complication  - cont SSI- resumed Metformin      Hypertension -   cont Coreg and Lisinopril  GERD -famotidine  DVT prophylaxis: sq  Lovenox Code Status: Full code Family Communication:  Disposition Plan: Home eventually.  Consultants: General Surgery. 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 Antimicrobials: None. Anti-infectives (From admission, onward)   Start     Dose/Rate Route Frequency Ordered Stop   09/02/17 0200  ertapenem (INVANZ) 1 g in sodium chloride 0.9 % 50 mL IVPB  Status:  Discontinued     1 g 100 mL/hr over 30 Minutes Intravenous Every 24 hours 09/02/17 0139 09/08/17 1038   09/02/17 0115  vancomycin (VANCOCIN) IVPB 1000 mg/200 mL premix     1,000 mg 200 mL/hr over 60 Minutes Intravenous  Once 09/02/17 0114 09/02/17 0221   09/02/17 0115  piperacillin-tazobactam (ZOSYN) IVPB 3.375 g  Status:  Discontinued     3.375 g 100 mL/hr over 30 Minutes Intravenous  Once 09/02/17 0114 09/02/17 0139       Objective: Vitals:   09/09/17 1559 09/09/17 2027 09/10/17 0450 09/10/17 1003  BP: (!) 147/95 (!) 160/96 (!) 157/95 (!) 132/97  Pulse: 88 88 90   Resp: 19 16 18    Temp: 97.9 F (36.6 C) 98 F (36.7 C) 98.2 F (36.8 C)   TempSrc: Oral     SpO2: 100% 98% 98%   Weight:      Height:        Intake/Output Summary (Last 24 hours) at 09/10/2017 1025 Last data filed at 09/10/2017 0450 Gross per 24 hour  Intake 240 ml  Output 1550 ml  Net -1310 ml   Filed Weights   09/07/17 0636  Weight: 99.3 kg (218 lb 14.4 oz)  Examination:09/09/17 patient seen and examined  General exam: Patient is not in any distress.  The patient is awake and alert.  Appears comfortable  HEENT: Mild pallor, no jaundice.   Respiratory system: Clear to auscultation. Respiratory effort normal. Cardiovascular system: S1 & S2 heard, RRR.  No murmurs  Gastrointestinal system: Abdomen soft, the patient has a colostomy bag towards the left lower abdominal quadrant area.  There is also a drain around the umbilical area.  Hypoactive bowel sounds.   Central nervous system: Alert and oriented. No focal  neurological deficits. Extremities: No cyanosis, clubbing or edema   Data Reviewed: I have personally reviewed following labs and imaging studies  CBC: Recent Labs  Lab 09/04/17 0255 09/04/17 2343 09/06/17 0751 09/07/17 0417  WBC 6.8 6.8 10.0 8.6  HGB 12.4* 12.0* 12.4* 10.8*  HCT 36.4* 34.8* 34.6* 31.8*  MCV 82.0 80.4 80.5 81.1  PLT 417* 415* 431* 390   Basic Metabolic Panel: Recent Labs  Lab 09/04/17 0255 09/04/17 2343 09/07/17 0417  NA 133* 134* 136  K 4.0 4.0 4.2  CL 103 104 105  CO2 23 22 25   GLUCOSE 94 99 115*  BUN 9 6 11   CREATININE 0.88 0.90 0.97  CALCIUM 7.8* 7.7* 8.0*   GFR: Estimated Creatinine Clearance: 104.6 mL/min (by C-G formula based on SCr of 0.97 mg/dL). Liver Function Tests: No results for input(s): AST, ALT, ALKPHOS, BILITOT, PROT, ALBUMIN in the last 168 hours. No results for input(s): LIPASE, AMYLASE in the last 168 hours. No results for input(s): AMMONIA in the last 168 hours. Coagulation Profile: No results for input(s): INR, PROTIME in the last 168 hours. Cardiac Enzymes: No results for input(s): CKTOTAL, CKMB, CKMBINDEX, TROPONINI in the last 168 hours. BNP (last 3 results) No results for input(s): PROBNP in the last 8760 hours. HbA1C: No results for input(s): HGBA1C in the last 72 hours. CBG: Recent Labs  Lab 09/09/17 1556 09/09/17 2025 09/10/17 0033 09/10/17 0448 09/10/17 0753  GLUCAP 110* 104* 109* 102* 111*   Lipid Profile: No results for input(s): CHOL, HDL, LDLCALC, TRIG, CHOLHDL, LDLDIRECT in the last 72 hours. Thyroid Function Tests: No results for input(s): TSH, T4TOTAL, FREET4, T3FREE, THYROIDAB in the last 72 hours. Anemia Panel: No results for input(s): VITAMINB12, FOLATE, FERRITIN, TIBC, IRON, RETICCTPCT in the last 72 hours. Urine analysis:    Component Value Date/Time   COLORURINE AMBER (A) 09/01/2017 1730   APPEARANCEUR CLEAR 09/01/2017 1730   APPEARANCEUR Clear 12/11/2013 0100   LABSPEC 1.036 (H)  09/01/2017 1730   LABSPEC 1.054 12/11/2013 0100   PHURINE 7.0 09/01/2017 1730   GLUCOSEU NEGATIVE 09/01/2017 1730   GLUCOSEU Negative 12/11/2013 0100   HGBUR NEGATIVE 09/01/2017 1730   BILIRUBINUR SMALL (A) 09/01/2017 1730   BILIRUBINUR Negative 12/11/2013 0100   KETONESUR NEGATIVE 09/01/2017 1730   PROTEINUR 30 (A) 09/01/2017 1730   NITRITE NEGATIVE 09/01/2017 1730   LEUKOCYTESUR SMALL (A) 09/01/2017 1730   LEUKOCYTESUR Negative 12/11/2013 0100   Sepsis Labs: @LABRCNTIP (procalcitonin:4,lacticidven:4) ) Recent Results (from the past 240 hour(s))  MRSA PCR Screening     Status: None   Collection Time: 09/05/17 10:03 AM  Result Value Ref Range Status   MRSA by PCR NEGATIVE NEGATIVE Final    Comment:        The GeneXpert MRSA Assay (FDA approved for NASAL specimens only), is one component of a comprehensive MRSA colonization surveillance program. It is not intended to diagnose MRSA infection nor to guide or monitor treatment for MRSA infections.  Radiology Studies: Dg Abd Portable 1v  Result Date: 09/09/2017 CLINICAL DATA:  Abdominal distention, but tightness has improved over last 24 hr. Feels air moving proximal bowels only. Abdominal surgery with colestomy 5 days ago. EXAM: PORTABLE ABDOMEN - 1 VIEW COMPARISON:  CT, 09/02/2017.  Radiographs, 08/28/2017. FINDINGS: Normal bowel gas pattern. Left lower quadrant colostomy. No evidence of renal or ureteral stones. Soft tissues are otherwise unremarkable. No significant skeletal abnormality. IMPRESSION: 1. No acute findings. No evidence of bowel obstruction or significant generalized adynamic ileus. Electronically Signed   By: Amie Portlandavid  Ormond M.D.   On: 09/09/2017 10:43      Scheduled Meds: . acetaminophen  1,000 mg Oral Q8H  . atorvastatin  10 mg Oral Daily  . carvedilol  10 mg Oral Daily  . enoxaparin (LOVENOX) injection  40 mg Subcutaneous Q24H  . famotidine  20 mg Oral BID  . feeding supplement (GLUCERNA SHAKE)   237 mL Oral TID BM  . furosemide  20 mg Oral Daily  . insulin aspart  0-9 Units Subcutaneous TID WC  . lisinopril  10 mg Oral Daily  . metFORMIN  500 mg Oral BID WC  . methocarbamol  500 mg Oral TID  . polyethylene glycol  17 g Oral Daily   Continuous Infusions: . sodium chloride 10 mL/hr at 09/07/17 1820  . lactated ringers Stopped (09/05/17 1440)     LOS: 8 days    Time spent in minutes: 35    [name] I Coleen Cardiff, MD [facility] Pager: [phone] [email or web address] Password [email or web address] 09/10/2017, 10:25 AM

## 2017-09-10 NOTE — Plan of Care (Signed)
  Pain Managment: General experience of comfort will improve 09/10/2017 0114 - Progressing by Kenneth Rios, Kenneth Strahm, RN  Rates abdominal pain 5/10, declines need for pain medicine at this time.  Refused to take any other medications tonight, since this was the first time stomach felt good did not want to make it feel bloated again with medications.  Will continue to monitor.

## 2017-09-10 NOTE — Progress Notes (Signed)
[facility] Progress Note  5 Days Post-Op  Subjective: CC-  States that he continues to have significant abdominal pain. Little to no air/stool output from ostomy yesterday. States that he feels bloated and has a decreased appetite. Denies n/v. Xray yesterday showed no obstruction or ileus.   Objective: Vital signs in last 24 hours: Temp:  [97.9 F (36.6 C)-98.2 F (36.8 C)] 98.2 F (36.8 C) (02/02 0450) Pulse Rate:  [88-90] 90 (02/02 0450) Resp:  [16-19] 18 (02/02 0450) BP: (147-160)/(95-96) 157/95 (02/02 0450) SpO2:  [98 %-100 %] 98 % (02/02 0450) Last BM Date: 09/08/17  Intake/Output from previous day: 02/01 0701 - 02/02 0700 In: 480 [P.O.:480] Out: 1550 [Urine:1550] Intake/Output this shift: No intake/output data recorded.  PE: Gen:  Alert, NAD HEENT: EOM's intact, pupils equal and round Pulm:  effort normal Abd: Soft, distended, hypoactive BS, midline wound vac in place, LLQ ostomy pink without air or stool in bag Psych: A&Ox3  Skin: no rashes noted, warm and dry  Lab Results:  No results for input(s): WBC, HGB, HCT, PLT in the last 72 hours. BMET No results for input(s): NA, K, CL, CO2, GLUCOSE, BUN, CREATININE, CALCIUM in the last 72 hours. PT/INR No results for input(s): LABPROT, INR in the last 72 hours. CMP     Component Value Date/Time   NA 136 09/07/2017 0417   NA 133 (L) 03/16/2014 0534   K 4.2 09/07/2017 0417   K 4.1 03/16/2014 0534   CL 105 09/07/2017 0417   CL 100 03/16/2014 0534   CO2 25 09/07/2017 0417   CO2 28 03/16/2014 0534   GLUCOSE 115 (H) 09/07/2017 0417   GLUCOSE 278 (H) 03/16/2014 0534   BUN 11 09/07/2017 0417   BUN 21 (H) 03/16/2014 0534   CREATININE 0.97 09/07/2017 0417   CREATININE 1.00 03/16/2014 0534   CALCIUM 8.0 (L) 09/07/2017 0417   CALCIUM 8.5 03/16/2014 0534   PROT 6.4 (L) 09/01/2017 1724   PROT 7.3 03/13/2014 1124   ALBUMIN 3.1 (L) 09/01/2017 1724   ALBUMIN 3.6 03/13/2014 1124   AST 20 09/01/2017 1724    AST 18 03/13/2014 1124   ALT 17 09/01/2017 1724   ALT 27 03/13/2014 1124   ALKPHOS 48 09/01/2017 1724   ALKPHOS 55 03/13/2014 1124   BILITOT 0.9 09/01/2017 1724   BILITOT 0.5 03/13/2014 1124   GFRNONAA >60 09/07/2017 0417   GFRNONAA >60 03/16/2014 0534   GFRAA >60 09/07/2017 0417   GFRAA >60 03/16/2014 0534   Lipase     Component Value Date/Time   LIPASE 21 09/01/2017 1724   LIPASE 135 12/10/2013 2156       Studies/Results: Dg Abd Portable 1v  Result Date: 09/09/2017 CLINICAL DATA:  Abdominal distention, but tightness has improved over last 24 hr. Feels air moving proximal bowels only. Abdominal surgery with colestomy 5 days ago. EXAM: PORTABLE ABDOMEN - 1 VIEW COMPARISON:  CT, 09/02/2017.  Radiographs, 08/28/2017. FINDINGS: Normal bowel gas pattern. Left lower quadrant colostomy. No evidence of renal or ureteral stones. Soft tissues are otherwise unremarkable. No significant skeletal abnormality. IMPRESSION: 1. No acute findings. No evidence of bowel obstruction or significant generalized adynamic ileus. Electronically Signed   By: Amie Portland M.D.   On: 09/09/2017 10:43    Anti-infectives: Anti-infectives (From admission, onward)   Start     Dose/Rate Route Frequency Ordered Stop   09/02/17 0200  ertapenem (INVANZ) 1 g in sodium chloride 0.9 % 50 mL IVPB  Status:  Discontinued  1 g 100 mL/hr over 30 Minutes Intravenous Every 24 hours 09/02/17 0139 09/08/17 1038   09/02/17 0115  vancomycin (VANCOCIN) IVPB 1000 mg/200 mL premix     1,000 mg 200 mL/hr over 60 Minutes Intravenous  Once 09/02/17 0114 09/02/17 0221   09/02/17 0115  piperacillin-tazobactam (ZOSYN) IVPB 3.375 g  Status:  Discontinued     3.375 g 100 mL/hr over 30 Minutes Intravenous  Once 09/02/17 0114 09/02/17 0139       Assessment/Plan HLD HTN Chronic 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 Sheliah HatchKinsinger - POD 5 - path: BENIGN COLORECTAL TYPE  MUCOSA WITH DIVERTICULAR DISEASE AND ASSOCIATED ABSCESS - vac changes MWF  ID - none currently FEN - carb modified diet VTE - lovenox Foley - none Follow up - Dr. Sheliah HatchKinsinger  Plan - Xray yesterday showed no obstruction or ileus, but clinically patient seems to have an ileus. Continue ambulating. Schedule robaxin for better pain control; minimize narcotics. Add daily miralax. Labs in AM.   LOS: 8 days    Franne FortsBrooke A Kiele Heavrin , [facility] 09/10/2017, 7:52 AM Pager: [phone] Consults: [phone] Mon-Fri 7:00 am-4:30 pm Sat-Sun 7:00 am-11:30 am

## 2017-09-11 ENCOUNTER — Inpatient Hospital Stay (HOSPITAL_COMMUNITY): Payer: Medicaid Other

## 2017-09-11 LAB — GLUCOSE, CAPILLARY
GLUCOSE-CAPILLARY: 95 mg/dL (ref 65–99)
GLUCOSE-CAPILLARY: 86 mg/dL (ref 65–99)
Glucose-Capillary: 102 mg/dL — ABNORMAL HIGH (ref 65–99)

## 2017-09-11 MED ORDER — HYDROMORPHONE HCL 1 MG/ML IJ SOLN
1.0000 mg | Freq: Four times a day (QID) | INTRAMUSCULAR | Status: DC | PRN
  Administered 2017-09-11 (×3): 1 mg via INTRAVENOUS
  Filled 2017-09-11 (×5): qty 1

## 2017-09-11 NOTE — Progress Notes (Signed)
PROGRESS NOTE    Kenneth Rios   AOZ:[id number]  DOB: [birth year]  DOA: 09/01/2017 PCP: Patient, No Pcp Per   Brief Narrative:  Kenneth Rios is a 52 y.o. male with past medical history significant for CHF, DM2, HTN, diverticulitis.  Patient was admitted from 1/16 to 1/22, initially for CHF exacerbation.  During that admit, patient developed onset of abdominal pain determined on 1/21 to be due to recurrent diverticulitis.  Previously hospitalized for diverticulitis in 2015.  Patient returned to the hospital on 09/01/2017 with worsening abdominal pain, severe, involving the left lower abdominal quadrant.   Subjective: 09/11/17: Patient seen alongside patient's live-in girlfriend.  Patient looks a lot better today.  Abdominal distention and discomfort have improved.  Patient is having bowel movements.  Will repeat abdominal x-ray.  No fever or chills.  No nausea.     Assessment & Plan:   Principal Problem:   Acute diverticulitis with abscess and Peritonitis   - management by surgery -  1/28- s/p Hartman's colectomy and colostomy  - advance diet per surgery - cut back IVF - wound vac in lower abdomen changed 09/07/17 - antibiotic management per surgery - 09/09/17 abd xray negative for obstruction -Repeat abdominal x-ray. -Ileus has resolved significantly.   -Keep potassium above 4.   Chronic HFREF 45-50% -2D echo 08/27/17 revealed LVEF 45-50% with inferior basal hypokinesis -will need to follow up with cardiology in the outpatient setting -continue coreg, lisinopril -restart lasix po daily  -IV lasix 60 mg once -monitor urine output -struict I&Os, daily weights  Active Problems: Dehydration -Resolved.   Diabetes mellitus without complication  - cont SSI- resumed Metformin  -Blood sugar ranges from 102-152.    Hypertension -   cont Coreg and Lisinopril -Blood pressure is 143/86 mmHg this morning. -Continue to monitor blood pressure closely.  GERD -famotidine  DVT  prophylaxis: sq Lovenox Code Status: Full code Family Communication:  Disposition Plan: Home eventually.  Consultants: General Surgery. 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 Antimicrobials: None. Anti-infectives (From admission, onward)   Start     Dose/Rate Route Frequency Ordered Stop   09/02/17 0200  ertapenem (INVANZ) 1 g in sodium chloride 0.9 % 50 mL IVPB  Status:  Discontinued     1 g 100 mL/hr over 30 Minutes Intravenous Every 24 hours 09/02/17 0139 09/08/17 1038   09/02/17 0115  vancomycin (VANCOCIN) IVPB 1000 mg/200 mL premix     1,000 mg 200 mL/hr over 60 Minutes Intravenous  Once 09/02/17 0114 09/02/17 0221   09/02/17 0115  piperacillin-tazobactam (ZOSYN) IVPB 3.375 g  Status:  Discontinued     3.375 g 100 mL/hr over 30 Minutes Intravenous  Once 09/02/17 0114 09/02/17 0139       Objective: Vitals:   09/10/17 1400 09/10/17 2059 09/11/17 0444 09/11/17 0912  BP: 127/83 (!) 143/81 (!) 142/99 (!) 143/86  Pulse: 79 85 76   Resp: 18 18 18    Temp: 98.6 F (37 C) 97.7 F (36.5 C) 97.8 F (36.6 C)   TempSrc: Oral Oral Oral   SpO2: 99% 98% 97%   Weight:      Height:        Intake/Output Summary (Last 24 hours) at 09/11/2017 0949 Last data filed at 09/10/2017 2055 Gross per 24 hour  Intake 394 ml  Output 1100 ml  Net -706 ml   Filed Weights   09/07/17 0636  Weight: 99.3 kg (218 lb 14.4 oz)    Examination:09/09/17 patient seen  and examined  General exam: Patient is not in any distress.  The patient is awake and alert.  Appears comfortable  HEENT: Mild pallor, no jaundice.   Respiratory system: Clear to auscultation. Respiratory effort normal. Cardiovascular system: S1 & S2 heard, RRR.  No murmurs  Gastrointestinal system: Abdomen soft, the patient has a colostomy bag towards the left lower abdominal quadrant area.  There is also a drain around the umbilical area.  Patient has normoactive bowel sounds.     Central nervous system: Alert  and oriented. No focal neurological deficits. Extremities: No cyanosis, clubbing or edema   Data Reviewed: I have personally reviewed following labs and imaging studies  CBC: Recent Labs  Lab 09/04/17 2343 09/06/17 0751 09/07/17 0417 09/10/17 1002  WBC 6.8 10.0 8.6 6.7  HGB 12.0* 12.4* 10.8* 12.3*  HCT 34.8* 34.6* 31.8* 36.4*  MCV 80.4 80.5 81.1 81.8  PLT 415* 431* 390 407*   Basic Metabolic Panel: Recent Labs  Lab 09/04/17 2343 09/07/17 0417 09/10/17 1002  NA 134* 136 136  K 4.0 4.2 4.3  CL 104 105 99*  CO2 22 25 26   GLUCOSE 99 115* 151*  BUN 6 11 9   CREATININE 0.90 0.97 1.10  CALCIUM 7.7* 8.0* 9.3   GFR: Estimated Creatinine Clearance: 92.3 mL/min (by C-G formula based on SCr of 1.1 mg/dL). Liver Function Tests: No results for input(s): AST, ALT, ALKPHOS, BILITOT, PROT, ALBUMIN in the last 168 hours. No results for input(s): LIPASE, AMYLASE in the last 168 hours. No results for input(s): AMMONIA in the last 168 hours. Coagulation Profile: No results for input(s): INR, PROTIME in the last 168 hours. Cardiac Enzymes: No results for input(s): CKTOTAL, CKMB, CKMBINDEX, TROPONINI in the last 168 hours. BNP (last 3 results) No results for input(s): PROBNP in the last 8760 hours. HbA1C: No results for input(s): HGBA1C in the last 72 hours. CBG: Recent Labs  Lab 09/10/17 1212 09/10/17 1631 09/10/17 2056 09/10/17 2350 09/11/17 0439  GLUCAP 103* 106* 117* 152* 102*   Lipid Profile: No results for input(s): CHOL, HDL, LDLCALC, TRIG, CHOLHDL, LDLDIRECT in the last 72 hours. Thyroid Function Tests: No results for input(s): TSH, T4TOTAL, FREET4, T3FREE, THYROIDAB in the last 72 hours. Anemia Panel: No results for input(s): VITAMINB12, FOLATE, FERRITIN, TIBC, IRON, RETICCTPCT in the last 72 hours. Urine analysis:    Component Value Date/Time   COLORURINE AMBER (A) 09/01/2017 1730   APPEARANCEUR CLEAR 09/01/2017 1730   APPEARANCEUR Clear 12/11/2013 0100    LABSPEC 1.036 (H) 09/01/2017 1730   LABSPEC 1.054 12/11/2013 0100   PHURINE 7.0 09/01/2017 1730   GLUCOSEU NEGATIVE 09/01/2017 1730   GLUCOSEU Negative 12/11/2013 0100   HGBUR NEGATIVE 09/01/2017 1730   BILIRUBINUR SMALL (A) 09/01/2017 1730   BILIRUBINUR Negative 12/11/2013 0100   KETONESUR NEGATIVE 09/01/2017 1730   PROTEINUR 30 (A) 09/01/2017 1730   NITRITE NEGATIVE 09/01/2017 1730   LEUKOCYTESUR SMALL (A) 09/01/2017 1730   LEUKOCYTESUR Negative 12/11/2013 0100   Sepsis Labs: @LABRCNTIP (procalcitonin:4,lacticidven:4) ) Recent Results (from the past 240 hour(s))  MRSA PCR Screening     Status: None   Collection Time: 09/05/17 10:03 AM  Result Value Ref Range Status   MRSA by PCR NEGATIVE NEGATIVE Final    Comment:        The GeneXpert MRSA Assay (FDA approved for NASAL specimens only), is one component of a comprehensive MRSA colonization surveillance program. It is not intended to diagnose MRSA infection nor to guide or monitor treatment for MRSA infections.  Radiology Studies: Dg Abd Portable 1v  Result Date: 09/09/2017 CLINICAL DATA:  Abdominal distention, but tightness has improved over last 24 hr. Feels air moving proximal bowels only. Abdominal surgery with colestomy 5 days ago. EXAM: PORTABLE ABDOMEN - 1 VIEW COMPARISON:  CT, 09/02/2017.  Radiographs, 08/28/2017. FINDINGS: Normal bowel gas pattern. Left lower quadrant colostomy. No evidence of renal or ureteral stones. Soft tissues are otherwise unremarkable. No significant skeletal abnormality. IMPRESSION: 1. No acute findings. No evidence of bowel obstruction or significant generalized adynamic ileus. Electronically Signed   By: Amie Portland M.D.   On: 09/09/2017 10:43      Scheduled Meds: . acetaminophen  1,000 mg Oral Q8H  . atorvastatin  10 mg Oral Daily  . carvedilol  10 mg Oral Daily  . enoxaparin (LOVENOX) injection  40 mg Subcutaneous Q24H  . famotidine  20 mg Oral BID  . feeding  supplement (GLUCERNA SHAKE)  237 mL Oral TID BM  . furosemide  20 mg Oral Daily  . insulin aspart  0-9 Units Subcutaneous TID WC  . lisinopril  10 mg Oral Daily  . metFORMIN  500 mg Oral BID WC  . methocarbamol  500 mg Oral TID  . polyethylene glycol  17 g Oral Daily   Continuous Infusions: . sodium chloride 10 mL/hr at 09/07/17 1820  . lactated ringers Stopped (09/05/17 1440)     LOS: 9 days    Time spent in minutes: 35    Barnetta Chapel, MD [facility] Pager: [phone] [email or web address] Password [facility] 09/11/2017, 9:49 AM

## 2017-09-11 NOTE — Progress Notes (Signed)
RN paged Kenneth FlemingsX. Blount, NP and reported patient's respiration rate of 34/minute.  Other VSS, O2 sat 100 %.  Patient states he just doesn't feel right.  Awaiting response.  P.J. Henderson NewcomerSexton, RN

## 2017-09-11 NOTE — Progress Notes (Signed)
Patient's respirations are 30/ minute at this time, but he states he feels normal for him right now.  Patient states he has CHF and this respiratory pattern is his baseline. RN will continue to monitor patient and report any changes as needed.  P.J. Henderson NewcomerSexton, RN

## 2017-09-11 NOTE — Progress Notes (Signed)
[facility] Progress Note  6 Days Post-Op  Subjective: CC-  States that he is having more output from colostomy. Continues to have abdominal pain up to 7/10, but it is improved from yesterday. Tolerating diet. Denies n/v. Ambulated multiple times in the halls yesterday. Required 5 doses of IV dilaudid.  Objective: Vital signs in last 24 hours: Temp:  [97.7 F (36.5 C)-98.6 F (37 C)] 97.8 F (36.6 C) (02/03 0444) Pulse Rate:  [76-85] 76 (02/03 0444) Resp:  [18] 18 (02/03 0444) BP: (127-143)/(81-99) 142/99 (02/03 0444) SpO2:  [97 %-99 %] 97 % (02/03 0444) Last BM Date: 09/10/17(through ostomy)  Intake/Output from previous day: 02/02 0701 - 02/03 0700 In: 874 [P.O.:840; I.V.:34] Out: 1100 [Urine:850; Stool:250] Intake/Output this shift: No intake/output data recorded.  PE: Gen:  Alert, NAD HEENT: EOM's intact, pupils equal and round Pulm:  effort normal Abd: Soft, distended, + BS, midline wound vac in place, LLQ ostomy pink with air and liquid stool in bag Psych: A&Ox3  Skin: no rashes noted, warm and dry  Lab Results:  Recent Labs    09/10/17 1002  WBC 6.7  HGB 12.3*  HCT 36.4*  PLT 407*   BMET Recent Labs    09/10/17 1002  NA 136  K 4.3  CL 99*  CO2 26  GLUCOSE 151*  BUN 9  CREATININE 1.10  CALCIUM 9.3   PT/INR No results for input(s): LABPROT, INR in the last 72 hours. CMP     Component Value Date/Time   NA 136 09/10/2017 1002   NA 133 (L) 03/16/2014 0534   K 4.3 09/10/2017 1002   K 4.1 03/16/2014 0534   CL 99 (L) 09/10/2017 1002   CL 100 03/16/2014 0534   CO2 26 09/10/2017 1002   CO2 28 03/16/2014 0534   GLUCOSE 151 (H) 09/10/2017 1002   GLUCOSE 278 (H) 03/16/2014 0534   BUN 9 09/10/2017 1002   BUN 21 (H) 03/16/2014 0534   CREATININE 1.10 09/10/2017 1002   CREATININE 1.00 03/16/2014 0534   CALCIUM 9.3 09/10/2017 1002   CALCIUM 8.5 03/16/2014 0534   PROT 6.4 (L) 09/01/2017 1724   PROT 7.3 03/13/2014 1124   ALBUMIN 3.1  (L) 09/01/2017 1724   ALBUMIN 3.6 03/13/2014 1124   AST 20 09/01/2017 1724   AST 18 03/13/2014 1124   ALT 17 09/01/2017 1724   ALT 27 03/13/2014 1124   ALKPHOS 48 09/01/2017 1724   ALKPHOS 55 03/13/2014 1124   BILITOT 0.9 09/01/2017 1724   BILITOT 0.5 03/13/2014 1124   GFRNONAA >60 09/10/2017 1002   GFRNONAA >60 03/16/2014 0534   GFRAA >60 09/10/2017 1002   GFRAA >60 03/16/2014 0534   Lipase     Component Value Date/Time   LIPASE 21 09/01/2017 1724   LIPASE 135 12/10/2013 2156       Studies/Results: Dg Abd Portable 1v  Result Date: 09/09/2017 CLINICAL DATA:  Abdominal distention, but tightness has improved over last 24 hr. Feels air moving proximal bowels only. Abdominal surgery with colestomy 5 days ago. EXAM: PORTABLE ABDOMEN - 1 VIEW COMPARISON:  CT, 09/02/2017.  Radiographs, 08/28/2017. FINDINGS: Normal bowel gas pattern. Left lower quadrant colostomy. No evidence of renal or ureteral stones. Soft tissues are otherwise unremarkable. No significant skeletal abnormality. IMPRESSION: 1. No acute findings. No evidence of bowel obstruction or significant generalized adynamic ileus. Electronically Signed   By: Amie Portland M.D.   On: 09/09/2017 10:43    Anti-infectives: Anti-infectives (From admission, onward)   Start  Dose/Rate Route Frequency Ordered Stop   09/02/17 0200  ertapenem (INVANZ) 1 g in sodium chloride 0.9 % 50 mL IVPB  Status:  Discontinued     1 g 100 mL/hr over 30 Minutes Intravenous Every 24 hours 09/02/17 0139 09/08/17 1038   09/02/17 0115  vancomycin (VANCOCIN) IVPB 1000 mg/200 mL premix     1,000 mg 200 mL/hr over 60 Minutes Intravenous  Once 09/02/17 0114 09/02/17 0221   09/02/17 0115  piperacillin-tazobactam (ZOSYN) IVPB 3.375 g  Status:  Discontinued     3.375 g 100 mL/hr over 30 Minutes Intravenous  Once 09/02/17 0114 09/02/17 0139       Assessment/Plan HLD HTN Chronic HFREF 45-50% DM GERD  Diverticulitis S/p open partial colectomy with  end colostomy 1/28 Dr. Sheliah HatchKinsinger - POD 6 - path: BENIGN COLORECTAL TYPE MUCOSA WITH DIVERTICULAR DISEASE AND ASSOCIATED ABSCESS - vac changes MWF  ID - none currently FEN - carb modified diet, miralax VTE - lovenox Foley - none Follow up - Dr. Sheliah HatchKinsinger  Plan - Increased ostomy output, better BS today, and pain slowly improving. Plan to wean IV dilaudid today. Continue ambulating. Continue bowel regimen.    LOS: 9 days    Franne FortsBrooke A Meuth , [facility] 09/11/2017, 7:45 AM Pager: [phone] Consults: [phone] Mon-Fri 7:00 am-4:30 pm Sat-Sun 7:00 am-11:30 am

## 2017-09-11 NOTE — Progress Notes (Signed)
Received call back from Linton FlemingsX. Blount, NP and advised to continue to monitor patient and see if he feels better since he has received Oxycodone 10 mg.  She feels patient may be having anxiety related to pain as he complains of lower back pain and pain at surgical site on abdomen at this time.  RN will continue to monitor patient and report any changes to NP as needed. P.J. Henderson NewcomerSexton, RN

## 2017-09-11 NOTE — Progress Notes (Signed)
Patient is complaining of pain in his lower back and thinks it is from lying in bed, describes pain as a spasm.  RN paged Linton FlemingsX. Blount, NP and requested order for k pad, awaiting response.  P.J. Henderson NewcomerSexton, RN

## 2017-09-12 ENCOUNTER — Inpatient Hospital Stay (HOSPITAL_COMMUNITY): Payer: Medicaid Other

## 2017-09-12 DIAGNOSIS — R0601 Orthopnea: Secondary | ICD-10-CM

## 2017-09-12 DIAGNOSIS — R0603 Acute respiratory distress: Secondary | ICD-10-CM

## 2017-09-12 LAB — COMPREHENSIVE METABOLIC PANEL
ALK PHOS: 57 U/L (ref 38–126)
ALT: 30 U/L (ref 17–63)
ANION GAP: 12 (ref 5–15)
AST: 24 U/L (ref 15–41)
Albumin: 2.9 g/dL — ABNORMAL LOW (ref 3.5–5.0)
BUN: 11 mg/dL (ref 6–20)
CALCIUM: 9 mg/dL (ref 8.9–10.3)
CO2: 23 mmol/L (ref 22–32)
Chloride: 103 mmol/L (ref 101–111)
Creatinine, Ser: 0.89 mg/dL (ref 0.61–1.24)
GFR calc non Af Amer: >60 mL/min (ref >60)
Glucose, Bld: 131 mg/dL — ABNORMAL HIGH (ref 65–99)
Potassium: 4.1 mmol/L (ref 3.5–5.1)
SODIUM: 138 mmol/L (ref 135–145)
Total Bilirubin: 0.6 mg/dL (ref 0.3–1.2)
Total Protein: 6.7 g/dL (ref 6.5–8.1)

## 2017-09-12 LAB — CBC
HCT: 33.8 % — ABNORMAL LOW (ref 39.0–52.0)
HEMOGLOBIN: 11.2 g/dL — AB (ref 13.0–17.0)
MCH: 27.4 pg (ref 26.0–34.0)
MCHC: 33.1 g/dL (ref 30.0–36.0)
MCV: 82.6 fL (ref 78.0–100.0)
PLATELETS: 334 10*3/uL (ref 150–400)
RBC: 4.09 MIL/uL — ABNORMAL LOW (ref 4.22–5.81)
RDW: 12.9 % (ref 11.5–15.5)
WBC: 4.8 10*3/uL (ref 4.0–10.5)

## 2017-09-12 LAB — GLUCOSE, CAPILLARY
GLUCOSE-CAPILLARY: 130 mg/dL — AB (ref 65–99)
Glucose-Capillary: 87 mg/dL (ref 65–99)
Glucose-Capillary: 120 mg/dL — ABNORMAL HIGH (ref 65–99)

## 2017-09-12 MED ORDER — HYDROMORPHONE HCL 1 MG/ML IJ SOLN
0.5000 mg | Freq: Four times a day (QID) | INTRAMUSCULAR | Status: DC | PRN
  Administered 2017-09-12 – 2017-09-13 (×3): 0.5 mg via INTRAVENOUS
  Filled 2017-09-12 (×3): qty 1

## 2017-09-12 MED ORDER — FUROSEMIDE 10 MG/ML IJ SOLN
60.0000 mg | Freq: Once | INTRAMUSCULAR | Status: AC
  Administered 2017-09-12: 60 mg via INTRAVENOUS
  Filled 2017-09-12: qty 6

## 2017-09-12 NOTE — Progress Notes (Addendum)
PROGRESS NOTE    REA RESER   ZHY:[id number]  DOB: March 10, 1966  DOA: 09/01/2017 PCP: Patient, No Pcp Per   Brief Narrative:  Kenneth Rios is a 53 y.o. male with past medical history significant for CHF, DM2, HTN, diverticulitis.  Patient was admitted from 1/16 to 1/22, initially for CHF exacerbation.  During that admit, patient developed onset of abdominal pain determined on 1/21 to be due to recurrent diverticulitis.  Previously hospitalized for diverticulitis in 2015.  Patient returned to the hospital on 09/01/2017 with worsening abdominal pain, severe, involving the left lower abdominal quadrant.   Subjective: 09/12/17: states abdominal pain is 7/10. abd xray no obstruction. Has good bowel sounds. Reports orthopnea overnight. cxr ordered. No evidence of acute cardiopulmonary disease. Denies chest pain or palpitations. Possible discharge to home tomorrow 09/13/17 if ok with surgery. Today patient concern about dyspnea when he lays down. Monitor on telemetry overnight.   Assessment & Plan:   Principal Problem:   Acute diverticulitis with abscess and Peritonitis   - management by surgery -  1/28- s/p Hartman's colectomy and colostomy  - advance diet per surgery - cut back IVF - wound vac in lower abdomen changed 09/07/17 - antibiotic management per surgery - 09/09/17 and 09/11/17 abd xray negative for obstruction -Ileus has resolved significantly.   -Keep potassium above 4.  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   Diabetes mellitus without complication  - cont SSI- resumed Metformin  -Blood sugar ranges from 102-152. - A1C 7.8 (08/25/17)    Hypertension -   cont Coreg and Lisinopril -Blood pressure is 143/86 mmHg this morning. -Continue to monitor blood pressure closely.  GERD -famotidine  DVT prophylaxis: sq Lovenox Code Status: Full code Family Communication:  Disposition Plan: Home possibly tomorrow 09/13/17 if no events overnight Consultants: General Surgery. 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 Antimicrobials: None. Anti-infectives (From admission, onward)   Start     Dose/Rate Route Frequency Ordered Stop   09/02/17 0200  ertapenem (INVANZ) 1 g in sodium chloride 0.9 % 50 mL IVPB  Status:  Discontinued     1 g 100 mL/hr over 30 Minutes Intravenous Every 24 hours 09/02/17 0139 09/08/17 1038   09/02/17 0115  vancomycin (VANCOCIN) IVPB 1000 mg/200 mL premix     1,000 mg 200 mL/hr over 60 Minutes Intravenous  Once 09/02/17 0114 09/02/17 0221   09/02/17 0115  piperacillin-tazobactam (ZOSYN) IVPB 3.375 g  Status:  Discontinued     3.375 g 100 mL/hr over 30 Minutes Intravenous  Once 09/02/17 0114 09/02/17 0139       Objective: Vitals:   09/11/17 1846 09/11/17 2028 09/11/17 2211 09/12/17 0604  BP: 137/77 139/80  (!) 148/83  Pulse: 87 85  77  Resp: 20 (!) 34 (!) 30 18  Temp: 97.6 F (36.4 C) 97.6 F (36.4 C)  98.1 F (36.7 C)  TempSrc: Oral Oral    SpO2: 95% 100%  100%  Weight:      Height:        Intake/Output Summary (Last 24 hours) at 09/12/2017 0839 Last data filed at 09/12/2017 0118 Gross per 24 hour  Intake 229.83 ml  Output 1575 ml  Net -1345.17 ml   Filed Weights   09/07/17 0636  Weight: 99.3 kg (218 lb 14.4 oz)    Examination: 09/12/17 patient seen and examined  General exam: Patient  is not in any distress.  The patient is awake and alert x3 HEENT: Mild pallor, no jaundice.   Respiratory system: Clear to auscultation. Respiratory effort normal. Cardiovascular system: S1 & S2 heard, RRR.  No murmurs  Gastrointestinal system: Abdomen soft, the patient has a colostomy bag towards the left lower abdominal quadrant area.  There is also a drain around the umbilical area.  Patient has normoactive bowel sounds.     Central nervous system: Alert and oriented. No focal neurological deficits. Extremities: No cyanosis, clubbing or edema   Data Reviewed: I have personally reviewed following labs and imaging studies  CBC: Recent Labs  Lab 09/06/17 0751 09/07/17 0417 09/10/17 1002  WBC 10.0 8.6 6.7  HGB 12.4* 10.8* 12.3*  HCT 34.6* 31.8* 36.4*  MCV 80.5 81.1 81.8  PLT 431* 390 407*   Basic Metabolic Panel: Recent Labs  Lab 09/07/17 0417 09/10/17 1002  NA 136 136  K 4.2 4.3  CL 105 99*  CO2 25 26  GLUCOSE 115* 151*  BUN 11 9  CREATININE 0.97 1.10  CALCIUM 8.0* 9.3   GFR: Estimated Creatinine Clearance: 92.3 mL/min (by C-G formula based on SCr of 1.1 mg/dL). Liver Function Tests: No results for input(s): AST, ALT, ALKPHOS, BILITOT, PROT, ALBUMIN in the last 168 hours. No results for input(s): LIPASE, AMYLASE in the last 168 hours. No results for input(s): AMMONIA in the last 168 hours. Coagulation Profile: No results for input(s): INR, PROTIME in the last 168 hours. Cardiac Enzymes: No results for input(s): CKTOTAL, CKMB, CKMBINDEX, TROPONINI in the last 168 hours. BNP (last 3 results) No results for input(s): PROBNP in the last 8760 hours. HbA1C: No results for input(s): HGBA1C in the last 72 hours. CBG: Recent Labs  Lab 09/10/17 2350 09/11/17 0439 09/11/17 1640 09/11/17 2038 09/12/17 0754  GLUCAP 152* 102* 95 86 87   Lipid Profile: No results for input(s): CHOL, HDL, LDLCALC, TRIG, CHOLHDL, LDLDIRECT in the last 72 hours. Thyroid Function  Tests: No results for input(s): TSH, T4TOTAL, FREET4, T3FREE, THYROIDAB in the last 72 hours. Anemia Panel: No results for input(s): VITAMINB12, FOLATE, FERRITIN, TIBC, IRON, RETICCTPCT in the last 72 hours. Urine analysis:    Component Value Date/Time   COLORURINE AMBER (A) 09/01/2017 1730   APPEARANCEUR CLEAR 09/01/2017 1730   APPEARANCEUR Clear 12/11/2013 0100   LABSPEC 1.036 (H) 09/01/2017 1730   LABSPEC 1.054 12/11/2013 0100   PHURINE 7.0 09/01/2017 1730   GLUCOSEU NEGATIVE 09/01/2017 1730   GLUCOSEU Negative 12/11/2013 0100   HGBUR NEGATIVE 09/01/2017 1730   BILIRUBINUR SMALL (A) 09/01/2017 1730   BILIRUBINUR Negative 12/11/2013 0100   KETONESUR NEGATIVE 09/01/2017 1730   PROTEINUR 30 (A) 09/01/2017 1730   NITRITE NEGATIVE 09/01/2017 1730   LEUKOCYTESUR SMALL (A) 09/01/2017 1730   LEUKOCYTESUR Negative 12/11/2013 0100   Sepsis Labs: @LABRCNTIP (procalcitonin:4,lacticidven:4) ) Recent Results (from the past 240 hour(s))  MRSA PCR Screening     Status: None   Collection Time: 09/05/17 10:03 AM  Result Value Ref Range Status   MRSA by PCR NEGATIVE NEGATIVE Final  Comment:        The GeneXpert MRSA Assay (FDA approved for NASAL specimens only), is one component of a comprehensive MRSA colonization surveillance program. It is not intended to diagnose MRSA infection nor to guide or monitor treatment for MRSA infections.          Radiology Studies: Dg Abd 2 Views  Result Date: 09/11/2017 CLINICAL DATA:  Ileus EXAM: ABDOMEN - 2 VIEW COMPARISON:  09/09/2017; CT abdomen and pelvis - 09/02/2017 FINDINGS: Nonobstructive bowel gas pattern. Presumed ostomy overlies the left lower abdomen. No pneumoperitoneum, pneumatosis or portal venous gas No definitive abnormal intra-calcifications. Limited visualization of lower thorax is normal. No acute osseus abnormalities. IMPRESSION: No evidence of enteric obstruction. Electronically Signed   By: Simonne Come M.D.   On: 09/11/2017  12:13      Scheduled Meds: . acetaminophen  1,000 mg Oral Q8H  . atorvastatin  10 mg Oral Daily  . carvedilol  10 mg Oral Daily  . enoxaparin (LOVENOX) injection  40 mg Subcutaneous Q24H  . famotidine  20 mg Oral BID  . feeding supplement (GLUCERNA SHAKE)  237 mL Oral TID BM  . furosemide  20 mg Oral Daily  . insulin aspart  0-9 Units Subcutaneous TID WC  . lisinopril  10 mg Oral Daily  . metFORMIN  500 mg Oral BID WC  . methocarbamol  500 mg Oral TID  . polyethylene glycol  17 g Oral Daily   Continuous Infusions: . sodium chloride 10 mL/hr at 09/07/17 1820  . lactated ringers Stopped (09/05/17 1440)     LOS: 10 days    Time spent in minutes: 35    Darlin Drop, MD [facility] Pager: [phone] [email or web address] Password [facility] 09/12/2017, 8:39 AM

## 2017-09-12 NOTE — Progress Notes (Signed)
Examined wound and it is reasonable for pt to switch to wet to dry dressings and discontinue the wound vac. Pt was agreeable to this plan.     Mattie MarlinJessica Krystl Wickware, [facility] Pager [phone]

## 2017-09-12 NOTE — Consult Note (Signed)
WOC Nurse wound follow up Wound type: Mid-abdominal surgical site Measurement: 12.2cm x 3.6cm x 1.2cm Wound bed:100% pink granulation tissue Drainage:  No drainage, no odor Periwound: Intact Dressing procedure/placement/frequency: NPWT discontinued; surgeon at bedside.  Saline moistened gauze placed to wound bed, covered with dry ABD pad, taped in place. Cammie Mcgeeawn Ayinde Swim MSN, RN, CWOCN, [place], CNS [phone]

## 2017-09-12 NOTE — Progress Notes (Signed)
[facility] Progress Note  7 Days Post-Op   Assessment/Plan HLD HTN Chronic HFREF 45-50% DM GERD  Diverticulitis S/popen partial colectomy with end colostomy1/28 Dr. Sheliah HatchKinsinger -POD 6 - path:BENIGN COLORECTAL TYPE MUCOSA WITH DIVERTICULAR DISEASE AND ASSOCIATED ABSCESS -vac changes MWF  ID -none currently FEN -carb modified diet, miralax VTE -lovenox Foley -none Follow up -Dr. Sheliah HatchKinsinger  Plan- Increased ostomy output. Continue ambulating. Continue bowel regimen. Will look at wound today to see if pt need to continue wound vac. Pt is clear for discharge from a surgical standpoint.     LOS: 10 days    Subjective: CC: abdominal pain  Pain is improving. He is tolerating diet. Having ostomy output. No new complaints.   Objective: Vital signs in last 24 hours: Temp:  [97.6 F (36.4 C)-98.1 F (36.7 C)] 98.1 F (36.7 C) (02/04 0604) Pulse Rate:  [77-87] 77 (02/04 0604) Resp:  [18-34] 18 (02/04 0604) BP: (137-148)/(77-83) 148/83 (02/04 0604) SpO2:  [95 %-100 %] 100 % (02/04 0604) Last BM Date: 09/11/17  Intake/Output from previous day: 02/03 0701 - 02/04 0700 In: 229.8 [I.V.:229.8] Out: 1575 [Urine:1125; Stool:450] Intake/Output this shift: No intake/output data recorded.  PE: Gen: Alert, NAD Pulm:effort normal Abd: Soft,mildly distended,+BS,midline wound vac in place, LLQ ostomy pink bag just changed Psych: A&Ox3  Skin: no rashes noted, warm and dry   Anti-infectives: Anti-infectives (From admission, onward)   Start     Dose/Rate Route Frequency Ordered Stop   09/02/17 0200  ertapenem (INVANZ) 1 g in sodium chloride 0.9 % 50 mL IVPB  Status:  Discontinued     1 g 100 mL/hr over 30 Minutes Intravenous Every 24 hours 09/02/17 0139 09/08/17 1038   09/02/17 0115  vancomycin (VANCOCIN) IVPB 1000 mg/200 mL premix     1,000 mg 200 mL/hr over 60 Minutes Intravenous  Once 09/02/17 0114 09/02/17 0221   09/02/17 0115   piperacillin-tazobactam (ZOSYN) IVPB 3.375 g  Status:  Discontinued     3.375 g 100 mL/hr over 30 Minutes Intravenous  Once 09/02/17 0114 09/02/17 0139      Lab Results:  Recent Labs    09/10/17 1002 09/12/17 0924  WBC 6.7 4.8  HGB 12.3* 11.2*  HCT 36.4* 33.8*  PLT 407* 334   BMET Recent Labs    09/10/17 1002 09/12/17 0924  NA 136 138  K 4.3 4.1  CL 99* 103  CO2 26 23  GLUCOSE 151* 131*  BUN 9 11  CREATININE 1.10 0.89  CALCIUM 9.3 9.0   PT/INR No results for input(s): LABPROT, INR in the last 72 hours. CMP     Component Value Date/Time   NA 138 09/12/2017 0924   NA 133 (L) 03/16/2014 0534   K 4.1 09/12/2017 0924   K 4.1 03/16/2014 0534   CL 103 09/12/2017 0924   CL 100 03/16/2014 0534   CO2 23 09/12/2017 0924   CO2 28 03/16/2014 0534   GLUCOSE 131 (H) 09/12/2017 0924   GLUCOSE 278 (H) 03/16/2014 0534   BUN 11 09/12/2017 0924   BUN 21 (H) 03/16/2014 0534   CREATININE 0.89 09/12/2017 0924   CREATININE 1.00 03/16/2014 0534   CALCIUM 9.0 09/12/2017 0924   CALCIUM 8.5 03/16/2014 0534   PROT 6.7 09/12/2017 0924   PROT 7.3 03/13/2014 1124   ALBUMIN 2.9 (L) 09/12/2017 0924   ALBUMIN 3.6 03/13/2014 1124   AST 24 09/12/2017 0924   AST 18 03/13/2014 1124   ALT 30 09/12/2017 0924   ALT 27  03/13/2014 1124   ALKPHOS 57 09/12/2017 0924   ALKPHOS 55 03/13/2014 1124   BILITOT 0.6 09/12/2017 0924   BILITOT 0.5 03/13/2014 1124   GFRNONAA >60 09/12/2017 0924   GFRNONAA >60 03/16/2014 0534   GFRAA >60 09/12/2017 0924   GFRAA >60 03/16/2014 0534   Lipase     Component Value Date/Time   LIPASE 21 09/01/2017 1724   LIPASE 135 12/10/2013 2156    Studies/Results: Dg Abd 2 Views  Result Date: 09/11/2017 CLINICAL DATA:  Ileus EXAM: ABDOMEN - 2 VIEW COMPARISON:  09/09/2017; CT abdomen and pelvis - 09/02/2017 FINDINGS: Nonobstructive bowel gas pattern. Presumed ostomy overlies the left lower abdomen. No pneumoperitoneum, pneumatosis or portal venous gas No definitive  abnormal intra-calcifications. Limited visualization of lower thorax is normal. No acute osseus abnormalities. IMPRESSION: No evidence of enteric obstruction. Electronically Signed   By: Simonne Come M.D.   On: 09/11/2017 12:13      Jerre Simon , [facility] 09/12/2017, 11:58 AM Pager: [phone] Consults: [phone] Mon-Fri 7:00 am-4:30 pm Sat-Sun 7:00 am-11:30 am

## 2017-09-12 NOTE — Care Management Note (Signed)
Case Management Note  Patient Details  Name: Kenneth Rios MRN: [id number] Date of Birth: [birth year]  Subjective/Objective:        Diverticulitis               Open pressure colectomy with end colostomy on 09/05/2017  PCP: Dr.Newlin  Action/Plan: Transition to home with home health services (RN) to follow. Pt approved for home health services [place] [facility]- charity case.   Expected Discharge Date:    09/12/2016             Expected Discharge Plan:  Home/Self Care  In-House Referral:     Discharge planning Services  CM Consult, Medication Assistance, Indigent Health Clinic  Post Acute Care Choice:  Home Health, Durable Medical Equipment Choice offered to:  Patient  DME Arranged:   DME Agency:    HH Arranged:  RN HH Agency:  [facility]  Status of Service:  completed  If discussed at Long Length of Stay Meetings, dates discussed:    Additional Comments:  Epifanio LeschesCole, Kenneth Spader Hudson, RN 09/12/2017, 4:01 PM

## 2017-09-13 DIAGNOSIS — K5792 Diverticulitis of intestine, part unspecified, without perforation or abscess without bleeding: Secondary | ICD-10-CM

## 2017-09-13 LAB — GLUCOSE, CAPILLARY
GLUCOSE-CAPILLARY: 109 mg/dL — AB (ref 65–99)
Glucose-Capillary: 104 mg/dL — ABNORMAL HIGH (ref 65–99)
Glucose-Capillary: 101 mg/dL — ABNORMAL HIGH (ref 65–99)
Glucose-Capillary: 127 mg/dL — ABNORMAL HIGH (ref 65–99)

## 2017-09-13 MED ORDER — GLUCERNA SHAKE PO LIQD: 237.0000 mL | Freq: Three times a day (TID) | ORAL | 0 refills | Status: DC

## 2017-09-13 MED ORDER — POLYETHYLENE GLYCOL 3350 17 G PO PACK: 17.0000 g | PACK | Freq: Every day | ORAL | 0 refills | Status: DC

## 2017-09-13 NOTE — Progress Notes (Signed)
Pt being discharged home via wheelchair with family. Pt alert and oriented x4. VSS. Pt c/o no pain at this time. No signs of respiratory distress. Education complete and care plans resolved. IV removed with catheter intact and pt tolerated well. No further issues at this time. Pt to follow up with PCP. Graydon Fofana R, RN 

## 2017-09-13 NOTE — Progress Notes (Signed)
[facility] Progress Note  8 Days Post-Op   Assessment/Plan HLD HTN Chronic HFREF 45-50% DM GERD  Diverticulitis S/popen partial colectomy with end colostomy1/28 Dr. Sheliah Hatch - path:BENIGN COLORECTAL TYPE MUCOSA WITH DIVERTICULAR DISEASE AND ASSOCIATED ABSCESS -wet to dry dressing changes BID   ID -none currently FEN -carb modified diet, miralax VTE -lovenox Foley -none Follow up -Dr. Sheliah Hatch  Plan-Continue ambulating. Continue bowel regimen. Pt is clear for discharge from a surgical standpoint.     LOS: 11 days    Subjective: CC: abdominal pain  Pain is the same no worse. He is tolerating diet. Having ostomy output. No new complaints. GF/wife at bedside.   Objective: Vital signs in last 24 hours: Temp:  [97.9 F (36.6 C)-98 F (36.7 C)] 98 F (36.7 C) (02/05 0525) Pulse Rate:  [73-77] 76 (02/05 0525) Resp:  [14-20] 14 (02/05 0525) BP: (132-148)/(79-89) 133/80 (02/05 0525) SpO2:  [98 %-100 %] 100 % (02/05 0525) Last BM Date: 09/12/17  Intake/Output from previous day: 02/04 0701 - 02/05 0700 In: -  Out: 1150 [Urine:1150] Intake/Output this shift: No intake/output data recorded.  PE: Gen: Alert, NAD Pulm:effort normal Abd: Soft,ND,+BS,midline wound C/D/I, LLQ ostomy pink with tan semisolid stool in bag, mild TTP without guarding Psych: A&Ox3  Skin: no rashes noted, warm and dry   Anti-infectives: Anti-infectives (From admission, onward)   Start     Dose/Rate Route Frequency Ordered Stop   09/02/17 0200  ertapenem (INVANZ) 1 g in sodium chloride 0.9 % 50 mL IVPB  Status:  Discontinued     1 g 100 mL/hr over 30 Minutes Intravenous Every 24 hours 09/02/17 0139 09/08/17 1038   09/02/17 0115  vancomycin (VANCOCIN) IVPB 1000 mg/200 mL premix     1,000 mg 200 mL/hr over 60 Minutes Intravenous  Once 09/02/17 0114 09/02/17 0221   09/02/17 0115  piperacillin-tazobactam (ZOSYN) IVPB 3.375 g  Status:  Discontinued      3.375 g 100 mL/hr over 30 Minutes Intravenous  Once 09/02/17 0114 09/02/17 0139      Lab Results:  Recent Labs    09/12/17 0924  WBC 4.8  HGB 11.2*  HCT 33.8*  PLT 334   BMET Recent Labs    09/12/17 0924  NA 138  K 4.1  CL 103  CO2 23  GLUCOSE 131*  BUN 11  CREATININE 0.89  CALCIUM 9.0   PT/INR No results for input(s): LABPROT, INR in the last 72 hours. CMP     Component Value Date/Time   NA 138 09/12/2017 0924   NA 133 (L) 03/16/2014 0534   K 4.1 09/12/2017 0924   K 4.1 03/16/2014 0534   CL 103 09/12/2017 0924   CL 100 03/16/2014 0534   CO2 23 09/12/2017 0924   CO2 28 03/16/2014 0534   GLUCOSE 131 (H) 09/12/2017 0924   GLUCOSE 278 (H) 03/16/2014 0534   BUN 11 09/12/2017 0924   BUN 21 (H) 03/16/2014 0534   CREATININE 0.89 09/12/2017 0924   CREATININE 1.00 03/16/2014 0534   CALCIUM 9.0 09/12/2017 0924   CALCIUM 8.5 03/16/2014 0534   PROT 6.7 09/12/2017 0924   PROT 7.3 03/13/2014 1124   ALBUMIN 2.9 (L) 09/12/2017 0924   ALBUMIN 3.6 03/13/2014 1124   AST 24 09/12/2017 0924   AST 18 03/13/2014 1124   ALT 30 09/12/2017 0924   ALT 27 03/13/2014 1124   ALKPHOS 57 09/12/2017 0924   ALKPHOS 55 03/13/2014 1124   BILITOT 0.6 09/12/2017 0924   BILITOT  0.5 03/13/2014 1124   GFRNONAA >60 09/12/2017 0924   GFRNONAA >60 03/16/2014 0534   GFRAA >60 09/12/2017 0924   GFRAA >60 03/16/2014 0534   Lipase     Component Value Date/Time   LIPASE 21 09/01/2017 1724   LIPASE 135 12/10/2013 2156    Studies/Results: Dg Chest Port 1 View  Result Date: 09/12/2017 CLINICAL DATA:  Shortness of breath EXAM: PORTABLE CHEST 1 VIEW COMPARISON:  CTA chest dated 08/26/2017 FINDINGS: Lungs are clear.  No pleural effusion or pneumothorax. Mild cardiomegaly. IMPRESSION: No evidence of acute cardiopulmonary disease. Electronically Signed   By: Charline BillsSriyesh  Krishnan M.D.   On: 09/12/2017 15:26   Dg Abd 2 Views  Result Date: 09/11/2017 CLINICAL DATA:  Ileus EXAM: ABDOMEN - 2 VIEW  COMPARISON:  09/09/2017; CT abdomen and pelvis - 09/02/2017 FINDINGS: Nonobstructive bowel gas pattern. Presumed ostomy overlies the left lower abdomen. No pneumoperitoneum, pneumatosis or portal venous gas No definitive abnormal intra-calcifications. Limited visualization of lower thorax is normal. No acute osseus abnormalities. IMPRESSION: No evidence of enteric obstruction. Electronically Signed   By: Simonne ComeJohn  Watts M.D.   On: 09/11/2017 12:13      Jerre SimonJessica L Focht , [facility] 09/13/2017, 10:15 AM Pager: [phone] Consults: [phone] Mon-Fri 7:00 am-4:30 pm Sat-Sun 7:00 am-11:30 am

## 2017-09-13 NOTE — Discharge Summary (Signed)
Physician Discharge Summary  Patient ID: Kenneth Rios MRN: [id number] DOB/AGE: 1966-01-05 52 y.o.  Admit date: 09/01/2017 Discharge date: 09/13/2017  Admission Diagnoses:  Discharge Diagnoses:  Principal Problem:   Acute diverticulitis Active Problems:   Diabetes mellitus without complication (HCC)   Hypertension   Peritonitis (HCC)   Discharged Condition: stable  Hospital Course: Patient is a 52 year old African-American male with past medical history significant for chronic systolic congestive heart failure, hypertension, diverticulitis and type 2 diabetes mellitus.  Patient developed abdominal pain 3 days prior to presentation.  The patient is known to have recurrent diverticulitis.  Patient's abdominal pain was reported to be severe, mainly involving the left lower quadrant of the abdomen.  CT scan of the abdomen and pelvis done revealed progression of the inflammatory changes in the left pelvis area, consistent with progression of acute diverticulitis.  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  Patient was managed with IV antibiotics, and the surgical team was consulted.  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  Wound VAC was also placed.  Postop.  Was managed mainly by the surgical team, and was significant for ileus.  Wound wound VAC has been taken out.  Ileus has resolved.  Patient will be discharged back home today with home health nursing to follow.  Wet-to-dry dressing dressing will be continued.  Acute diverticulitis with abscess and Peritonitis -patient underwent Hartman's colectomy and colostomy on 09/05/2017.  Wound VAC was placed during surgery, and change on 09/07/2017.  The wound VAC has been taken out.  Postop, the patient developed ileus.  The ileus has resolved.  Patient has been cleared for discharge by the surgical team.  Patient will be followed by home health nursing for wound care and discharge.  Chronic HFREF  45-50%: 2D echo done on 08/27/2017 revealed left ventricular ejection fraction of 45-50%, with inferior basal hypokinesis.  Patient is to continue Coreg and lisinopril.  On diuretics.  We need to monitor patient's renal function, electrolytes and the volume status.  Dehydration: This has resolved.  Diabetes mellitus without complication: Patient be discharged on metformin.  Currently managed expectantly.  Hypertension: Continue Coreg and lisinopril.  This is optimize.   Consults: general surgery  Significant Diagnostic Studies: radiology: CT scan of the abdomen and pelvis revealed Progression of inflammatory changes in the left pelvis consistent with significant progression of acute diverticulitis. 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 No free air. Aortic atherosclerosis. Small esophageal hiatal hernia.  Treatments: surgery  Discharge Exam: Blood pressure 133/80, pulse 76, temperature 98 F (36.7 C), temperature source Oral, resp. rate 14, height 5\' 9"  (1.753 m), weight 99.3 kg (218 lb 14.4 oz), SpO2 100 %.   Disposition: 01-Home or Self Care  Discharge Instructions    Call MD for:   Complete by:  As directed    Please call MD if the symptoms worsen.   Diet - low sodium heart healthy   Complete by:  As directed    Diet Carb Modified   Complete by:  As directed    Face-to-face encounter (required for Medicare/Medicaid patients)   Complete by:  As directed    I [name] I [name] certify that this patient is under my care and that I, or a nurse practitioner or physician's assistant working with me, had a face-to-face encounter that meets the physician face-to-face encounter requirements with this patient on 09/13/2017. The encounter with the patient was in whole, or in  part for the following medical condition(s) which is the primary reason for home health care (List medical condition):   The encounter with the patient was in whole, or in part, for  the following medical condition, which is the primary reason for home health care:  diverticular abscess   I certify that, based on my findings, the following services are medically necessary home health services:  Nursing   Reason for Medically Necessary Home Health Services:  Skilled Nursing- Post-Surgical Wound Assessment and Care   My clinical findings support the need for the above services:  Unable to leave home safely without assistance and/or assistive device   Further, I certify that my clinical findings support that this patient is homebound due to:  Unable to leave home safely without assistance   Home Health   Complete by:  As directed    To provide the following care/treatments:  RN   Increase activity slowly   Complete by:  As directed      Allergies as of 09/13/2017   No Known Allergies     Medication List    STOP taking these medications   levofloxacin 750 MG tablet Commonly known as:  LEVAQUIN   metroNIDAZOLE 500 MG tablet Commonly known as:  FLAGYL   sodium-potassium bicarbonate Tbef dissolvable tablet Commonly known as:  ALKA-SELTZER GOLD   THERAFLU MULTI SYMPTOM PO     TAKE these medications   atorvastatin 10 MG tablet Commonly known as:  LIPITOR Take 10 mg by mouth daily.   carvedilol 10 MG 24 hr capsule Commonly known as:  COREG CR Take 10 mg by mouth daily.   feeding supplement (GLUCERNA SHAKE) Liqd Take 237 mLs by mouth 3 (three) times daily between meals.   furosemide 20 MG tablet Commonly known as:  LASIX Take 20 mg by mouth daily.   lisinopril 10 MG tablet Commonly known as:  PRINIVIL,ZESTRIL Take 10 mg by mouth daily.   metFORMIN 500 MG tablet Commonly known as:  GLUCOPHAGE Take 500 mg by mouth 2 (two) times daily with a meal.   oxyCODONE-acetaminophen 5-325 MG tablet Commonly known as:  PERCOCET Take 1 tablet by mouth every 6 (six) hours as needed for severe pain.   polyethylene glycol packet Commonly known as:  MIRALAX /  GLYCOLAX Take 17 g by mouth daily.      Follow-up Information    [facility]. Go on 09/16/2017.   Why:  8:30 am with Dr.Newlin Contact information: [address] [place] [address] [phone]       [name], De Blanch, MD. Call on 09/29/2017.   Specialty:  General Surgery Why:  @ 9:00AM. Please arrive 30 minutes prior to complete paperwork.  Contact information: [address] [place] [address] [phone]          33 minutes spent discharging this patient.  SignedBarnetta Chapel 09/13/2017, 10:20 AM

## 2017-09-16 ENCOUNTER — Ambulatory Visit: Payer: Medicaid Other | Attending: Family Medicine | Admitting: Family Medicine

## 2017-09-16 VITALS — BP 132/76 | HR 74 | Temp 97.6°F | Ht 69.0 in | Wt 201.8 lb

## 2017-09-16 DIAGNOSIS — Z79899 Other long term (current) drug therapy: Secondary | ICD-10-CM | POA: Diagnosis not present

## 2017-09-16 DIAGNOSIS — I509 Heart failure, unspecified: Secondary | ICD-10-CM | POA: Insufficient documentation

## 2017-09-16 DIAGNOSIS — I110 Hypertensive heart disease with heart failure: Secondary | ICD-10-CM | POA: Diagnosis not present

## 2017-09-16 DIAGNOSIS — Z933 Colostomy status: Secondary | ICD-10-CM

## 2017-09-16 DIAGNOSIS — K5792 Diverticulitis of intestine, part unspecified, without perforation or abscess without bleeding: Secondary | ICD-10-CM | POA: Diagnosis not present

## 2017-09-16 DIAGNOSIS — E119 Type 2 diabetes mellitus without complications: Secondary | ICD-10-CM | POA: Diagnosis present

## 2017-09-16 DIAGNOSIS — Z7984 Long term (current) use of oral hypoglycemic drugs: Secondary | ICD-10-CM | POA: Insufficient documentation

## 2017-09-16 DIAGNOSIS — Z9049 Acquired absence of other specified parts of digestive tract: Secondary | ICD-10-CM | POA: Insufficient documentation

## 2017-09-16 DIAGNOSIS — I10 Essential (primary) hypertension: Secondary | ICD-10-CM

## 2017-09-16 HISTORY — DX: Colostomy status: Z93.3

## 2017-09-16 LAB — GLUCOSE, POCT (MANUAL RESULT ENTRY): POC Glucose: 175 mg/dl — AB (ref 70–99)

## 2017-09-16 MED ORDER — ATORVASTATIN CALCIUM 10 MG PO TABS: 10.0000 mg | ORAL_TABLET | Freq: Every day | ORAL | 3 refills | Status: DC

## 2017-09-16 MED ORDER — METFORMIN HCL 500 MG PO TABS: 1000.0000 mg | ORAL_TABLET | Freq: Two times a day (BID) | ORAL | 3 refills | Status: DC

## 2017-09-16 MED ORDER — ACETAMINOPHEN-CODEINE #3 300-30 MG PO TABS: 1.0000 | ORAL_TABLET | Freq: Three times a day (TID) | ORAL | 0 refills | Status: DC | PRN

## 2017-09-16 MED ORDER — FUROSEMIDE 20 MG PO TABS: 20.0000 mg | ORAL_TABLET | Freq: Every day | ORAL | 3 refills | Status: DC

## 2017-09-16 MED ORDER — KETOROLAC TROMETHAMINE 60 MG/2ML IM SOLN
60.0000 mg | Freq: Once | INTRAMUSCULAR | Status: AC
  Administered 2017-09-16: 60 mg via INTRAMUSCULAR

## 2017-09-16 MED ORDER — LISINOPRIL 5 MG PO TABS: 5.0000 mg | ORAL_TABLET | Freq: Every day | ORAL | 3 refills | Status: DC

## 2017-09-16 MED FILL — LISINOPRIL 5 MG TABLET: 5 | 30 days supply | Qty: 30 | Fill #0

## 2017-09-16 MED FILL — ACETAMINOPHEN/COD #3 TABLET: 300-30 | 20 days supply | Qty: 60 | Fill #0

## 2017-09-16 MED FILL — ?FUROSEMIDE 20 MG TABLET: 20 | 30 days supply | Qty: 30 | Fill #0

## 2017-09-16 MED FILL — ?METFORMIN HCL 500MG TABLET: 500 | 30 days supply | Qty: 120 | Fill #0

## 2017-09-16 MED FILL — ?ATORVASTATIN 10 MG TABLET: 10 | 30 days supply | Qty: 30 | Fill #0

## 2017-09-16 NOTE — Progress Notes (Signed)
Subjective:  Patient ID: Kenneth Rios, male    DOB: 1965/12/12  Age: 52 y.o. MRN: [id number]  CC: Hospitalization Follow-up   HPI Kenneth Rios is a 52 year old male with a history of type 2 diabetes mellitus (A1c 7.8), hypertension, CHF (EF 45-50% from echo 08/2017) with recent hospitalization at [facility] for Acute diverticulitis with perforation status post Hartmann's colectomy and colostomy.  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  General surgery was consulted and he underwent Hartman's colectomy and colostomy on 09/05/17 with subsequent placement of wound VAC; he was also on antibiotics. Postop course was plicated by ileus which resolved.  Echocardiogram 08/27/17: Study Conclusions  - Left ventricle: Inferior / Inferior basal hypokinesis The cavity   size was mildly dilated. Wall thickness was increased in a   pattern of mild LVH. Systolic function was mildly reduced. The   estimated ejection fraction was in the range of 45% to 50%.   Doppler parameters are consistent with elevated ventricular   end-diastolic filling pressure. - Mitral valve: Restricted posterior leaflet motion. There was mild   regurgitation. - Left atrium: The atrium was moderately dilated. - Atrial septum: No defect or patent foramen ovale was identified.  Wound VAC was subsequently discontinued and he was discharged with recommendations to follow-up with general surgery.  He is accompanied by a male friend today and informs me he [facility] who last visited 2 days ago.  He complains of severe pain in his abdomen, reduced appetite.  Past Medical History:  Diagnosis Date  . CHF (congestive heart failure) (HCC)   . Diabetes mellitus without complication (HCC)   . Diverticulitis   . Hypertension     Past Surgical History:  Procedure  Laterality Date  . COLON RESECTION N/A 09/05/2017   Procedure: HARTMAN'S COLECTOMY AND COLOSTOMY;  Surgeon: Sheliah Hatch De Blanch, MD;  Location: [facility] OR;  Service: General;  Laterality: N/A;    No Known Allergies   Outpatient Medications Prior to Visit  Medication Sig Dispense Refill  . carvedilol (COREG CR) 10 MG 24 hr capsule Take 10 mg by mouth daily.    Marland Kitchen atorvastatin (LIPITOR) 10 MG tablet Take 10 mg by mouth daily.    . feeding supplement, GLUCERNA SHAKE, (GLUCERNA SHAKE) LIQD Take 237 mLs by mouth 3 (three) times daily between meals. (Patient not taking: Reported on 09/16/2017) 90 Can 0  . oxyCODONE-acetaminophen (PERCOCET) 5-325 MG tablet Take 1 tablet by mouth every 6 (six) hours as needed for severe pain. (Patient not taking: Reported on 09/16/2017) 10 tablet 0  . polyethylene glycol (MIRALAX / GLYCOLAX) packet Take 17 g by mouth daily. (Patient not taking: Reported on 09/16/2017) 14 each 0  . furosemide (LASIX) 20 MG tablet Take 20 mg by mouth daily.    Marland Kitchen lisinopril (PRINIVIL,ZESTRIL) 10 MG tablet Take 10 mg by mouth daily.    . metFORMIN (GLUCOPHAGE) 500 MG tablet Take 500 mg by mouth 2 (two) times daily with a meal.     No facility-administered medications prior to visit.     ROS Review of Systems  Constitutional: Positive for appetite change. Negative for activity change.  HENT: Negative for sinus pressure and sore throat.   Eyes: Negative for visual disturbance.  Respiratory: Negative for cough, chest tightness and shortness of breath.   Cardiovascular: Negative for chest pain and leg swelling.  Gastrointestinal: Positive  for abdominal pain. Negative for abdominal distention, constipation and diarrhea.  Endocrine: Negative.   Genitourinary: Negative for dysuria.  Musculoskeletal: Negative for joint swelling and myalgias.  Skin: Negative for rash.  Allergic/Immunologic: Negative.   Neurological: Negative for weakness, light-headedness and numbness.  Psychiatric/Behavioral:  Negative for dysphoric mood and suicidal ideas.    Objective:  BP 132/76   Pulse 74   Temp 97.6 F (36.4 C) (Oral)   Ht 5\' 9"  (1.753 m)   Wt 201 lb 12.8 oz (91.5 kg)   SpO2 100%   BMI 29.80 kg/m   BP/Weight 09/16/2017 09/13/2017 09/07/2017  Systolic BP 132 133 -  Diastolic BP 76 80 -  Wt. (Lbs) 201.8 - 218.9  BMI 29.8 - 32.33      Physical Exam  Constitutional: He is oriented to person, place, and time.  In acute pain  Cardiovascular: Normal rate, normal heart sounds and intact distal pulses.  No murmur heard. Pulmonary/Chest: Effort normal and breath sounds normal. He has no wheezes. He has no rales. He exhibits no tenderness.  Abdominal: Soft. Bowel sounds are normal. He exhibits mass (colostomy bag). He exhibits no distension. There is tenderness.  Open vertical abdominal wound with granulation tissue at base of wound  Musculoskeletal: Normal range of motion.  Neurological: He is alert and oriented to person, place, and time.  Skin: Skin is warm and dry.  Psychiatric: He has a normal mood and affect.    Lab Results  Component Value Date   HGBA1C 7.8 (H) 08/25/2017    Assessment & Plan:   1. Diabetes mellitus without complication (HCC) A1c of 7.8 Continue current medications, diabetic diet and lifestyle modifications - POCT glucose (manual entry) - metFORMIN (GLUCOPHAGE) 500 MG tablet; Take 2 tablets (1,000 mg total) by mouth 2 (two) times daily with a meal.  Dispense: 120 tablet; Refill: 3 - atorvastatin (LIPITOR) 10 MG tablet; Take 1 tablet (10 mg total) by mouth daily.  Dispense: 30 tablet; Refill: 3  2. Acute diverticulitis Dressing change performed in the clinic Advised to keep appointment with general surgery - acetaminophen-codeine (TYLENOL #3) 300-30 MG tablet; Take 1 tablet by mouth every 8 (eight) hours as needed for moderate pain.  Dispense: 60 tablet; Refill: 0  3. Acute on chronic congestive heart failure, unspecified heart failure type (HCC) EF  45-50% from echo of 11/2017 Euvolemic Continue medications, daily weights, heart healthy low-sodium diet - furosemide (LASIX) 20 MG tablet; Take 1 tablet (20 mg total) by mouth daily.  Dispense: 30 tablet; Refill: 3  4. S/P colostomy (HCC) Continue colostomy care Provided ensure samples from the clinic  5. Essential hypertension Controlled Counseled on blood pressure goal of less than 130/80, low-sodium, DASH diet, medication compliance, 150 minutes of moderate intensity exercise per week. Discussed medication compliance, adverse effects. - lisinopril (PRINIVIL,ZESTRIL) 5 MG tablet; Take 1 tablet (5 mg total) by mouth daily.  Dispense: 30 tablet; Refill: 3   Meds ordered this encounter  Medications  . acetaminophen-codeine (TYLENOL #3) 300-30 MG tablet    Sig: Take 1 tablet by mouth every 8 (eight) hours as needed for moderate pain.    Dispense:  60 tablet    Refill:  0  . metFORMIN (GLUCOPHAGE) 500 MG tablet    Sig: Take 2 tablets (1,000 mg total) by mouth 2 (two) times daily with a meal.    Dispense:  120 tablet    Refill:  3  . furosemide (LASIX) 20 MG tablet    Sig: Take 1  tablet (20 mg total) by mouth daily.    Dispense:  30 tablet    Refill:  3  . lisinopril (PRINIVIL,ZESTRIL) 5 MG tablet    Sig: Take 1 tablet (5 mg total) by mouth daily.    Dispense:  30 tablet    Refill:  3  . atorvastatin (LIPITOR) 10 MG tablet    Sig: Take 1 tablet (10 mg total) by mouth daily.    Dispense:  30 tablet    Refill:  3    Follow-up: Return in about 1 month (around 10/14/2017) for Follow-up of diabetes mellitus.   Hoy Register MD

## 2017-09-16 NOTE — Progress Notes (Signed)
Patient has been out of medication for 3 months.

## 2017-09-17 ENCOUNTER — Encounter: Payer: Self-pay | Admitting: Family Medicine

## 2017-09-19 ENCOUNTER — Telehealth: Payer: Self-pay | Admitting: General Practice

## 2017-09-19 NOTE — Telephone Encounter (Signed)
[facility] called to inform you that the patient was advised to go be evaluated because he was having thoughts of self harm

## 2017-09-19 NOTE — Telephone Encounter (Signed)
Caseworker IT trainerAlice Rios from [facility] called to Inform that this Pt has been having suicidal thoughts, such as "jumping off a bridge". Please follow up as soon as possible.

## 2017-09-20 ENCOUNTER — Telehealth: Payer: Self-pay | Admitting: Licensed Clinical Social Worker

## 2017-09-20 NOTE — Telephone Encounter (Signed)
Needs to be referred to the ED 

## 2017-09-20 NOTE — Telephone Encounter (Signed)
Kenneth Rios was informed of PCP's response, and she will arrange his visit to the ED.Thank you

## 2017-09-20 NOTE — Telephone Encounter (Signed)
[facility] made contact with pt to follow up on behavioral health.   Pt shared that his increase in depression and anxiety symptoms stem from ongoing medical concerns. He is currently on restrictions resulting in his inability to financially provide for his family. Pt reports hx of suicidal ideations within the last two weeks; however, denies current SI/HI/AVH.   [facility] validated pt's feelings and offered encouragement. [facility] educated pt on correlation between one's physical and mental health and discussed healthy coping skills to decrease symptoms. Pt has a safety plan established. He copes with stressors by utilizing his support system (girlfriend and mother) and spending time with his 83five month old puppy.   [facility] to [facility] with crisis intervention, psychotherapy, medication management, and rent to his address on file. He agreed to referral to Legal Aid to assist with disability appeal.   Pt was strongly encouraged to contact PCP and [facility] if symptoms worsen or fail to improve. Pt verbalized understanding.

## 2017-09-20 NOTE — Telephone Encounter (Signed)
Caseworker IT trainerAlice Rios from [facility] called to say that Pt refused ED visit. However a safety plan has been formulated to prevent any self damage. Pt is feeling much better, and will be speaking with family members to reduce any future risks.

## 2017-09-20 NOTE — Telephone Encounter (Signed)
Needs to be referred to the ED

## 2017-09-20 NOTE — Telephone Encounter (Signed)
Will route  to PCP and Child psychotherapistsocial worker.

## 2017-10-14 MED FILL — FUROSEMIDE 20 MG TABLET: 20 | 30 days supply | Qty: 30 | Fill #1

## 2017-10-14 MED FILL — LISINOPRIL 5 MG TAB: 5 | 30 days supply | Qty: 30 | Fill #1

## 2017-10-18 ENCOUNTER — Ambulatory Visit: Payer: Self-pay | Attending: Family Medicine

## 2017-11-03 ENCOUNTER — Ambulatory Visit: Payer: Medicaid Other | Attending: Family Medicine | Admitting: Family Medicine

## 2017-11-03 ENCOUNTER — Encounter: Payer: Self-pay | Admitting: Family Medicine

## 2017-11-03 VITALS — BP 165/111 | HR 79 | Temp 97.7°F | Ht 69.0 in | Wt 215.2 lb

## 2017-11-03 DIAGNOSIS — Z7984 Long term (current) use of oral hypoglycemic drugs: Secondary | ICD-10-CM | POA: Insufficient documentation

## 2017-11-03 DIAGNOSIS — I110 Hypertensive heart disease with heart failure: Secondary | ICD-10-CM | POA: Diagnosis not present

## 2017-11-03 DIAGNOSIS — E119 Type 2 diabetes mellitus without complications: Secondary | ICD-10-CM

## 2017-11-03 DIAGNOSIS — Z933 Colostomy status: Secondary | ICD-10-CM | POA: Diagnosis not present

## 2017-11-03 DIAGNOSIS — Z79899 Other long term (current) drug therapy: Secondary | ICD-10-CM | POA: Insufficient documentation

## 2017-11-03 DIAGNOSIS — I509 Heart failure, unspecified: Secondary | ICD-10-CM | POA: Diagnosis not present

## 2017-11-03 DIAGNOSIS — I10 Essential (primary) hypertension: Secondary | ICD-10-CM

## 2017-11-03 LAB — GLUCOSE, POCT (MANUAL RESULT ENTRY): POC GLUCOSE: 166 mg/dL — AB (ref 70–99)

## 2017-11-03 MED ORDER — CARVEDILOL PHOSPHATE ER 10 MG PO CP24: 10.0000 mg | ORAL_CAPSULE | Freq: Every day | ORAL | 3 refills | Status: DC

## 2017-11-03 MED ORDER — CARVEDILOL 6.25 MG PO TABS: 6.2500 mg | ORAL_TABLET | Freq: Two times a day (BID) | ORAL | 3 refills | Status: DC

## 2017-11-03 MED ORDER — LISINOPRIL 10 MG PO TABS: 10.0000 mg | ORAL_TABLET | Freq: Every day | ORAL | 3 refills | Status: DC

## 2017-11-03 MED FILL — ?ATORVASTATIN 10 MG TABLET: 10 | 30 days supply | Qty: 30 | Fill #1

## 2017-11-03 MED FILL — ?CARVEDILOL 6.25 MG TABLET: 6.25 | 30 days supply | Qty: 60 | Fill #0

## 2017-11-03 MED FILL — ?METFORMIN HCL 500MG TABLET: 500 | 30 days supply | Qty: 120 | Fill #1

## 2017-11-03 MED FILL — LISINOPRIL 10 MG TABS: 10 | 30 days supply | Qty: 30 | Fill #0

## 2017-11-03 NOTE — Patient Instructions (Signed)

## 2017-11-03 NOTE — Progress Notes (Signed)
Subjective:  Patient ID: Kenneth Rios, male    DOB: 07-03-1966  Age: 52 y.o. 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  He has been to see his surgeon and his  abdominal wound is healing well by secondary intention; he has an upcoming appointment next month.  Doing well with his colostomy but however he is anxious to have his colostomy reversed as he is unable to function at home to his full capacity. His mood has improved and he is coping with the situation well he states as he denies abdominal pain.  We had discussed possible initiation of  an SSRI at his last visit and again today which he declines. Daily wound dressing changes were performed by his friend at home. Tolerating his antihypertensive but his blood pressure is elevated today despite compliance. Doing well on metformin and denies hypoglycemia, numbness in extremities..  Trying to comply with a diabetic diet With regards to his CHF he denies pedal edema, shortness of breath, orthopnea and his exercise tolerance is good.  Past Medical History:  Diagnosis Date  . CHF (congestive heart failure) (HCC)   . Diabetes mellitus without complication (HCC)   . Diverticulitis   . Hypertension     Past Surgical History:  Procedure Laterality Date  . COLON RESECTION N/A 09/05/2017   Procedure: HARTMAN'S COLECTOMY AND COLOSTOMY;  Surgeon: Sheliah Hatch De Blanch, MD;  Location: [facility] OR;  Service: General;  Laterality: N/A;    No Known Allergies   Outpatient Medications Prior to Visit  Medication Sig Dispense Refill  . atorvastatin (LIPITOR) 10 MG tablet Take 1 tablet (10 mg total) by mouth daily. 30 tablet 3  . furosemide (LASIX) 20 MG tablet  Take 1 tablet (20 mg total) by mouth daily. 30 tablet 3  . metFORMIN (GLUCOPHAGE) 500 MG tablet Take 2 tablets (1,000 mg total) by mouth 2 (two) times daily with a meal. 120 tablet 3  . carvedilol (COREG CR) 10 MG 24 hr capsule Take 10 mg by mouth daily.    Marland Kitchen lisinopril (PRINIVIL,ZESTRIL) 5 MG tablet Take 1 tablet (5 mg total) by mouth daily. 30 tablet 3  . feeding supplement, GLUCERNA SHAKE, (GLUCERNA SHAKE) LIQD Take 237 mLs by mouth 3 (three) times daily between meals. (Patient not taking: Reported on 09/16/2017) 90 Can 0  . polyethylene glycol (MIRALAX / GLYCOLAX) packet Take 17 g by mouth daily. (Patient not taking: Reported on 09/16/2017) 14 each 0  . acetaminophen-codeine (TYLENOL #3) 300-30 MG tablet Take 1 tablet by mouth every 8 (eight) hours as needed for moderate pain. (Patient not taking: Reported on 11/03/2017) 60 tablet 0  . oxyCODONE-acetaminophen (PERCOCET) 5-325 MG tablet Take 1 tablet by mouth every 6 (six) hours as needed for severe pain. (Patient not taking: Reported on 09/16/2017) 10 tablet 0   No facility-administered medications prior to visit.     ROS Review of Systems  Constitutional: Negative for activity change and appetite change.  HENT: Negative for sinus pressure and sore throat.   Eyes: Negative for visual disturbance.  Respiratory: Negative for cough, chest tightness and shortness of breath.   Cardiovascular: Negative for chest pain and leg swelling.  Gastrointestinal: Negative for abdominal distention, abdominal pain, constipation and diarrhea.  Endocrine: Negative.   Genitourinary: Negative for dysuria.  Musculoskeletal: Negative for joint swelling and myalgias.  Skin: Positive for wound. Negative for rash.  Allergic/Immunologic: Negative.   Neurological: Negative for weakness, light-headedness and numbness.  Psychiatric/Behavioral: Negative for dysphoric mood and suicidal ideas.    Objective:  BP (!) 165/111   Pulse 79   Temp 97.7 F (36.5 C) (Oral)   Ht  5\' 9"  (1.753 m)   Wt 215 lb 3.2 oz (97.6 kg)   SpO2 100%   BMI 31.78 kg/m   BP/Weight 11/03/2017 09/16/2017 09/13/2017  Systolic BP 165 132 133  Diastolic BP 111 76 80  Wt. (Lbs) 215.2 201.8 -  BMI 31.78 29.8 -      Physical Exam  Constitutional: He is oriented to person, place, and time. He appears well-developed and well-nourished.  Cardiovascular: Normal rate, normal heart sounds and intact distal pulses.  No murmur heard. Pulmonary/Chest: Effort normal and breath sounds normal. He has no wheezes. He has no rales. He exhibits no tenderness.  Abdominal: Soft. Bowel sounds are normal. He exhibits mass (colostomy bag in place). He exhibits no distension. There is no tenderness.  Vertical abdominal wound healing by secondary intention and has almost closed completely with the exception of the lower part with opposed edges and minimal drainage.  Musculoskeletal: Normal range of motion.  Neurological: He is alert and oriented to person, place, and time.  Skin: Skin is warm and dry.  Psychiatric: He has a normal mood and affect.    Lab Results  Component Value Date   HGBA1C 7.8 (H) 08/25/2017    Assessment & Plan:   1. Diabetes mellitus without complication (HCC) A1c of 7.8 - not fully optimized No regimen change today and will await next A1c - POCT glucose (manual entry)  2. Essential hypertension Uncontrolled Increase dose of lisinopril DC Coreg CR due to cost and placed on Coreg 6.25mg  bid Counseled on blood pressure goal of less than 130/80, low-sodium, DASH diet, medication compliance, 150 minutes of moderate intensity exercise per week. Discussed medication compliance, adverse effects. - lisinopril (PRINIVIL,ZESTRIL) 10 MG tablet; Take 1 tablet (10 mg total) by mouth daily.  Dispense: 30 tablet; Refill: 3  3. S/P colostomy (HCC) Abdominal wound is healing and he is in better spirits Declines medications as he states his depression has improved but he is hoping for an  early reversal of his colostomy Continue colostomy care Follow up general surgery regarding reversal  Meds ordered this encounter  Medications  . lisinopril (PRINIVIL,ZESTRIL) 10 MG tablet    Sig: Take 1 tablet (10 mg total) by mouth daily.    Dispense:  30 tablet    Refill:  3  . carvedilol (COREG CR) 10 MG 24 hr capsule    Sig: Take 1 capsule (10 mg total) by mouth daily.    Dispense:  30 capsule    Refill:  3    Follow-up: Return in about 3 months (around 02/03/2018) for follow up of diabetes and hypertension.   Hoy RegisterEnobong Shanessa Hodak MD

## 2017-11-04 ENCOUNTER — Telehealth: Payer: Self-pay | Admitting: Family Medicine

## 2017-11-04 NOTE — Telephone Encounter (Signed)
Pt came in to request a letter for his disability application, he needs a letter stating his problems or issues. Please follow up

## 2017-11-04 NOTE — Telephone Encounter (Signed)
Will route to PCP 

## 2017-11-07 ENCOUNTER — Other Ambulatory Visit: Payer: Self-pay | Admitting: Family Medicine

## 2017-11-07 NOTE — Telephone Encounter (Signed)
Ready for pick up

## 2017-11-08 NOTE — Telephone Encounter (Signed)
Patient was called and informed that letter is ready for pick up. 

## 2017-11-16 ENCOUNTER — Telehealth: Payer: Self-pay | Admitting: Family Medicine

## 2017-11-16 DIAGNOSIS — Z1211 Encounter for screening for malignant neoplasm of colon: Secondary | ICD-10-CM

## 2017-11-16 NOTE — Telephone Encounter (Signed)
Pt wife call since she is requesting a Colonoscopy for her [facility], Pt has the CAF and the OC, please follow up

## 2017-11-18 ENCOUNTER — Encounter: Payer: Self-pay | Admitting: Gastroenterology

## 2017-11-18 NOTE — Telephone Encounter (Signed)
Referred has been placed.  

## 2017-11-21 MED FILL — ?FUROSEMIDE 20 MG TABLET: 20 | 30 days supply | Qty: 30 | Fill #2

## 2017-12-01 ENCOUNTER — Telehealth: Payer: Self-pay | Admitting: Family Medicine

## 2017-12-01 NOTE — Telephone Encounter (Signed)
Pt came to the office to drop up disability paper for the pcp to be fill out, please fax it to [phone] and call Pt when  is done since he need the original, Pt was informed of the 10 to 14 business day process

## 2017-12-01 NOTE — Telephone Encounter (Signed)
Paperwork has been received and put in PCP office to fill out. Patient will be called once paperwork is ready.

## 2017-12-16 NOTE — Telephone Encounter (Signed)
noted 

## 2017-12-16 NOTE — Telephone Encounter (Signed)
Patient Fiance to check on the status of patients disability paperwork. Patient has a hearing on 12/22/17. Please f/u

## 2018-01-04 ENCOUNTER — Ambulatory Visit: Payer: Self-pay | Admitting: Gastroenterology

## 2018-01-19 MED FILL — ?FUROSEMIDE 20 MG TABLET: 20 | 30 days supply | Qty: 30 | Fill #3

## 2018-01-19 MED FILL — LISINOPRIL 10 MG TABS: 10 | 30 days supply | Qty: 30 | Fill #1

## 2018-02-02 ENCOUNTER — Ambulatory Visit: Payer: Self-pay | Admitting: Family Medicine

## 2018-02-06 ENCOUNTER — Ambulatory Visit: Payer: Medicaid Other | Attending: Family Medicine | Admitting: Family Medicine

## 2018-02-06 ENCOUNTER — Encounter: Payer: Self-pay | Admitting: Family Medicine

## 2018-02-06 ENCOUNTER — Other Ambulatory Visit: Payer: Self-pay

## 2018-02-06 VITALS — BP 151/86 | HR 70 | Temp 98.0°F | Ht 69.0 in | Wt 216.6 lb

## 2018-02-06 DIAGNOSIS — E119 Type 2 diabetes mellitus without complications: Secondary | ICD-10-CM | POA: Insufficient documentation

## 2018-02-06 DIAGNOSIS — Z79899 Other long term (current) drug therapy: Secondary | ICD-10-CM | POA: Insufficient documentation

## 2018-02-06 DIAGNOSIS — I5042 Chronic combined systolic (congestive) and diastolic (congestive) heart failure: Secondary | ICD-10-CM

## 2018-02-06 DIAGNOSIS — I110 Hypertensive heart disease with heart failure: Secondary | ICD-10-CM | POA: Insufficient documentation

## 2018-02-06 DIAGNOSIS — I10 Essential (primary) hypertension: Secondary | ICD-10-CM

## 2018-02-06 DIAGNOSIS — F419 Anxiety disorder, unspecified: Secondary | ICD-10-CM | POA: Insufficient documentation

## 2018-02-06 DIAGNOSIS — Z7984 Long term (current) use of oral hypoglycemic drugs: Secondary | ICD-10-CM | POA: Diagnosis not present

## 2018-02-06 DIAGNOSIS — Z933 Colostomy status: Secondary | ICD-10-CM | POA: Insufficient documentation

## 2018-02-06 DIAGNOSIS — N528 Other male erectile dysfunction: Secondary | ICD-10-CM

## 2018-02-06 DIAGNOSIS — F32A Depression, unspecified: Secondary | ICD-10-CM | POA: Insufficient documentation

## 2018-02-06 DIAGNOSIS — F329 Major depressive disorder, single episode, unspecified: Secondary | ICD-10-CM

## 2018-02-06 DIAGNOSIS — R9431 Abnormal electrocardiogram [ECG] [EKG]: Secondary | ICD-10-CM

## 2018-02-06 DIAGNOSIS — N529 Male erectile dysfunction, unspecified: Secondary | ICD-10-CM | POA: Diagnosis not present

## 2018-02-06 HISTORY — DX: Anxiety disorder, unspecified: F41.9

## 2018-02-06 HISTORY — DX: Depression, unspecified: F32.A

## 2018-02-06 HISTORY — DX: Abnormal electrocardiogram (ECG) (EKG): R94.31

## 2018-02-06 LAB — GLUCOSE, POCT (MANUAL RESULT ENTRY): POC GLUCOSE: 266 mg/dL — AB (ref 70–99)

## 2018-02-06 LAB — POCT GLYCOSYLATED HEMOGLOBIN (HGB A1C): HBA1C, POC (CONTROLLED DIABETIC RANGE): 9.8 % — AB (ref 0.0–7.0)

## 2018-02-06 MED ORDER — EMPAGLIFLOZIN 10 MG PO TABS: 10.0000 mg | ORAL_TABLET | Freq: Every day | ORAL | 3 refills | Status: DC

## 2018-02-06 MED ORDER — CARVEDILOL 6.25 MG PO TABS: 6.2500 mg | ORAL_TABLET | Freq: Two times a day (BID) | ORAL | 3 refills | Status: DC

## 2018-02-06 MED ORDER — LISINOPRIL 10 MG PO TABS: 10.0000 mg | ORAL_TABLET | Freq: Every day | ORAL | 3 refills | Status: DC

## 2018-02-06 MED ORDER — FUROSEMIDE 20 MG PO TABS: 20.0000 mg | ORAL_TABLET | Freq: Every day | ORAL | 3 refills | Status: DC

## 2018-02-06 MED ORDER — METFORMIN HCL 500 MG PO TABS: 1000.0000 mg | ORAL_TABLET | Freq: Two times a day (BID) | ORAL | 3 refills | Status: DC

## 2018-02-06 MED FILL — ?CARVEDILOL 6.25 MG TABLET: 6.25 | 30 days supply | Qty: 60 | Fill #0

## 2018-02-06 MED FILL — ?METFORMIN HCL 500MG TABS: 500 | 30 days supply | Qty: 120 | Fill #0

## 2018-02-06 NOTE — Patient Instructions (Signed)
Diabetes Mellitus and Nutrition When you have diabetes (diabetes mellitus), it is very important to have healthy eating habits because your blood sugar (glucose) levels are greatly affected by what you eat and drink. Eating healthy foods in the appropriate amounts, at about the same times every day, can help you:  Control your blood glucose.  Lower your risk of heart disease.  Improve your blood pressure.  Reach or maintain a healthy weight.  Every person with diabetes is different, and each person has different needs for a meal plan. Your health care provider may recommend that you work with a diet and nutrition specialist (dietitian) to make a meal plan that is best for you. Your meal plan may vary depending on factors such as:  The calories you need.  The medicines you take.  Your weight.  Your blood glucose, blood pressure, and cholesterol levels.  Your activity level.  Other health conditions you have, such as heart or kidney disease.  How do carbohydrates affect me? Carbohydrates affect your blood glucose level more than any other type of food. Eating carbohydrates naturally increases the amount of glucose in your blood. Carbohydrate counting is a method for keeping track of how many carbohydrates you eat. Counting carbohydrates is important to keep your blood glucose at a healthy level, especially if you use insulin or take certain oral diabetes medicines. It is important to know how many carbohydrates you can safely have in each meal. This is different for every person. Your dietitian can help you calculate how many carbohydrates you should have at each meal and for snack. Foods that contain carbohydrates include:  Bread, cereal, rice, pasta, and crackers.  Potatoes and corn.  Peas, beans, and lentils.  Milk and yogurt.  Fruit and juice.  Desserts, such as cakes, cookies, ice cream, and candy.  How does alcohol affect me? Alcohol can cause a sudden decrease in blood  glucose (hypoglycemia), especially if you use insulin or take certain oral diabetes medicines. Hypoglycemia can be a life-threatening condition. Symptoms of hypoglycemia (sleepiness, dizziness, and confusion) are similar to symptoms of having too much alcohol. If your health care provider says that alcohol is safe for you, follow these guidelines:  Limit alcohol intake to no more than 1 drink per day for nonpregnant women and 2 drinks per day for men. One drink equals 12 oz of beer, 5 oz of wine, or 1 oz of hard liquor.  Do not drink on an empty stomach.  Keep yourself hydrated with water, diet soda, or unsweetened iced tea.  Keep in mind that regular soda, juice, and other mixers may contain a lot of sugar and must be counted as carbohydrates.  What are tips for following this plan? Reading food labels  Start by checking the serving size on the label. The amount of calories, carbohydrates, fats, and other nutrients listed on the label are based on one serving of the food. Many foods contain more than one serving per package.  Check the total grams (g) of carbohydrates in one serving. You can calculate the number of servings of carbohydrates in one serving by dividing the total carbohydrates by 15. For example, if a food has 30 g of total carbohydrates, it would be equal to 2 servings of carbohydrates.  Check the number of grams (g) of saturated and trans fats in one serving. Choose foods that have low or no amount of these fats.  Check the number of milligrams (mg) of sodium in one serving. Most people   should limit total sodium intake to less than 2,300 mg per day.  Always check the nutrition information of foods labeled as "low-fat" or "nonfat". These foods may be higher in added sugar or refined carbohydrates and should be avoided.  Talk to your dietitian to identify your daily goals for nutrients listed on the label. Shopping  Avoid buying canned, premade, or processed foods. These  foods tend to be high in fat, sodium, and added sugar.  Shop around the outside edge of the grocery store. This includes fresh fruits and vegetables, bulk grains, fresh meats, and fresh dairy. Cooking  Use low-heat cooking methods, such as baking, instead of high-heat cooking methods like deep frying.  Cook using healthy oils, such as olive, canola, or sunflower oil.  Avoid cooking with butter, cream, or high-fat meats. Meal planning  Eat meals and snacks regularly, preferably at the same times every day. Avoid going long periods of time without eating.  Eat foods high in fiber, such as fresh fruits, vegetables, beans, and whole grains. Talk to your dietitian about how many servings of carbohydrates you can eat at each meal.  Eat 4-6 ounces of lean protein each day, such as lean meat, chicken, fish, eggs, or tofu. 1 ounce is equal to 1 ounce of meat, chicken, or fish, 1 egg, or 1/4 cup of tofu.  Eat some foods each day that contain healthy fats, such as avocado, nuts, seeds, and fish. Lifestyle   Check your blood glucose regularly.  Exercise at least 30 minutes 5 or more days each week, or as told by your health care provider.  Take medicines as told by your health care provider.  Do not use any products that contain nicotine or tobacco, such as cigarettes and e-cigarettes. If you need help quitting, ask your health care provider.  Work with a counselor or diabetes educator to identify strategies to manage stress and any emotional and social challenges. What are some questions to ask my health care provider?  Do I need to meet with a diabetes educator?  Do I need to meet with a dietitian?  What number can I call if I have questions?  When are the best times to check my blood glucose? Where to find more information:  American Diabetes Association: diabetes.org/food-and-fitness/food  Academy of Nutrition and Dietetics:  www.eatright.org/resources/health/diseases-and-conditions/diabetes  National Institute of Diabetes and Digestive and Kidney Diseases (NIH): www.niddk.nih.gov/health-information/diabetes/overview/diet-eating-physical-activity Summary  A healthy meal plan will help you control your blood glucose and maintain a healthy lifestyle.  Working with a diet and nutrition specialist (dietitian) can help you make a meal plan that is best for you.  Keep in mind that carbohydrates and alcohol have immediate effects on your blood glucose levels. It is important to count carbohydrates and to use alcohol carefully. This information is not intended to replace advice given to you by your health care provider. Make sure you discuss any questions you have with your health care provider. Document Released: 04/22/2005 Document Revised: 08/30/2016 Document Reviewed: 08/30/2016 Elsevier Interactive Patient Education  2018 Elsevier Inc.  

## 2018-02-06 NOTE — Progress Notes (Signed)
Subjective:  Patient ID: Kenneth Rios, male    DOB: 07-17-1966  Age: 52 y.o. 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 His A1c is 9.8 which has trended up from 7.8 previously and he is accompanied by a male friend who states the patient has not been compliant with a diabetic diet and has been eating lots of popsicles.  He denies numbness in his extremities, visual concerns or hypoglycemia. He does complain of dyspnea on exertion and is unable to walk around the block and has also noticed some palpitations but no chest pain.  He endorses anxiety and depression but denies suicidal ideation or intents.  Attributes his symptoms to current psychosocial stressors and informs me the stressors should be over the next few weeks.  He is not open to speaking with an LCSW today. Prior to relocating to [facility] he was followed by a cardiologist but does not have one at this time. He has been compliant with his general surgery appointments and is anxious to have his colostomy reversed but informs me he was advised this would be done in the near future. He also complains of some erectile dysfunction.  Past Medical History:  Diagnosis Date  . CHF (congestive heart failure) (HCC)   . Diabetes mellitus without complication (HCC)   . Diverticulitis   . Hypertension     Past Surgical History:  Procedure Laterality Date  . COLON RESECTION N/A 09/05/2017   Procedure: HARTMAN'S COLECTOMY AND COLOSTOMY;  Surgeon: Sheliah Hatch De Blanch, MD;  Location: [facility] OR;  Service: General;  Laterality: N/A;    No Known Allergies   Outpatient Medications Prior to Visit  Medication Sig Dispense Refill  . atorvastatin  (LIPITOR) 10 MG tablet Take 1 tablet (10 mg total) by mouth daily. 30 tablet 3  . carvedilol (COREG CR) 10 MG 24 hr capsule Take 1 capsule (10 mg total) by mouth daily. 30 capsule 3  . furosemide (LASIX) 20 MG tablet Take 1 tablet (20 mg total) by mouth daily. 30 tablet 3  . lisinopril (PRINIVIL,ZESTRIL) 10 MG tablet Take 1 tablet (10 mg total) by mouth daily. 30 tablet 3  . metFORMIN (GLUCOPHAGE) 500 MG tablet Take 2 tablets (1,000 mg total) by mouth 2 (two) times daily with a meal. 120 tablet 3  . feeding supplement, GLUCERNA SHAKE, (GLUCERNA SHAKE) LIQD Take 237 mLs by mouth 3 (three) times daily between meals. (Patient not taking: Reported on 09/16/2017) 90 Can 0  . polyethylene glycol (MIRALAX / GLYCOLAX) packet Take 17 g by mouth daily. (Patient not taking: Reported on 09/16/2017) 14 each 0  . carvedilol (COREG) 6.25 MG tablet Take 1 tablet (6.25 mg total) by mouth 2 (two) times daily with a meal. (Patient not taking: Reported on 02/06/2018) 60 tablet 3   No facility-administered medications prior to visit.     ROS Review of Systems  Constitutional: Negative for activity change and appetite change.  HENT: Negative for sinus pressure and sore throat.   Eyes: Negative for visual disturbance.  Respiratory: Positive for shortness of breath. Negative for cough and chest tightness.   Cardiovascular: Positive for palpitations. Negative for chest pain and leg swelling.  Gastrointestinal: Negative for abdominal distention, abdominal pain, constipation and diarrhea.  Endocrine: Negative.   Genitourinary: Negative for dysuria.  Musculoskeletal: Negative for joint swelling and myalgias.  Skin: Negative for rash.  Allergic/Immunologic: Negative.   Neurological: Negative for weakness, light-headedness and numbness.  Psychiatric/Behavioral: Negative for dysphoric mood and suicidal ideas.    Objective:  BP (!) 151/86   Pulse 70   Temp 98 F (36.7 C) (Oral)   Ht 5\' 9"  (1.753 m)   Wt 216 lb 9.6 oz  (98.2 kg)   SpO2 99%   BMI 31.99 kg/m   BP/Weight 02/06/2018 11/03/2017 09/16/2017  Systolic BP 151 165 132  Diastolic BP 86 111 76  Wt. (Lbs) 216.6 215.2 201.8  BMI 31.99 31.78 29.8      Physical Exam  Constitutional: He is oriented to person, place, and time. He appears well-developed and well-nourished.  Cardiovascular: Normal rate, normal heart sounds and intact distal pulses.  No murmur heard. Pulmonary/Chest: Effort normal and breath sounds normal. He has no wheezes. He has no rales. He exhibits no tenderness.  Abdominal: Soft. Bowel sounds are normal. He exhibits no distension and no mass. There is no tenderness.  Colostomy in place  Musculoskeletal: Normal range of motion.  Neurological: He is alert and oriented to person, place, and time.  Skin: Skin is warm and dry.  Psychiatric: He has a normal mood and affect.     Depression screen [facility] 2/9 02/06/2018 11/03/2017 09/16/2017  Decreased Interest 3 3 3   Down, Depressed, Hopeless 3 3 3   PHQ - 2 Score 6 6 6   Altered sleeping 3 3 2   Tired, decreased energy 3 3 3   Change in appetite 3 2 3   Feeling bad or failure about yourself  3 3 3   Trouble concentrating 3 2 1   Moving slowly or fidgety/restless 3 0 0  Suicidal thoughts 1 0 1  PHQ-9 Score 25 19 19     GAD 7 : Generalized Anxiety Score 02/06/2018 11/03/2017 09/16/2017  Nervous, Anxious, on Edge 3 0 3  Control/stop worrying 3 3 3   Worry too much - different things 3 3 3   Trouble relaxing 3 3 3   Restless 3 3 3   Easily annoyed or irritable 3 3 3   Afraid - awful might happen 3 0 0  Total GAD 7 Score 21 15 18      CMP Latest Ref Rng & Units 09/12/2017 09/10/2017 09/07/2017  Glucose 65 - 99 mg/dL 161(W) 960(A) 540(J)  BUN 6 - 20 mg/dL 11 9 11   Creatinine 0.61 - 1.24 mg/dL 8.11 9.14 7.82  Sodium 135 - 145 mmol/L 138 136 136  Potassium 3.5 - 5.1 mmol/L 4.1 4.3 4.2  Chloride 101 - 111 mmol/L 103 99(L) 105  CO2 22 - 32 mmol/L 23 26 25   Calcium 8.9 - 10.3 mg/dL 9.0 9.3 8.0(L)  Total  Protein 6.5 - 8.1 g/dL 6.7 - -  Total Bilirubin 0.3 - 1.2 mg/dL 0.6 - -  Alkaline Phos 38 - 126 U/L 57 - -  AST 15 - 41 U/L 24 - -  ALT 17 - 63 U/L 30 - -     Lab Results  Component Value Date   HGBA1C 9.8 (A) 02/06/2018    Assessment & Plan:   1. Diabetes mellitus without complication (HCC) Uncontrolled with A1c of 9.8 He is opposed to using an injectable We will commence on Jardiance  while he works on his lifestyle modifications and if still elevated will have to discuss initiating Victoza Counseled on Diabetic diet, my plate method, 956 minutes of moderate  intensity exercise/week Keep blood sugar logs with fasting goals of 80-120 mg/dl, random of less than 161 and in the event of sugars less than 60 mg/dl or greater than 096 mg/dl please notify the clinic ASAP. It is recommended that you undergo annual eye exams and annual foot exams. Pneumonia vaccine is recommended. - POCT glucose (manual entry) - POCT glycosylated hemoglobin (Hb A1C) - empagliflozin (JARDIANCE) 10 MG TABS tablet; Take 10 mg by mouth daily.  Dispense: 30 tablet; Refill: 3 - metFORMIN (GLUCOPHAGE) 500 MG tablet; Take 2 tablets (1,000 mg total) by mouth 2 (two) times daily with a meal.  Dispense: 120 tablet; Refill: 3  2. Chronic combined systolic and diastolic congestive heart failure (HCC) EF of 45 to 50% He appears euvolemic, weight is stable but he complains of dyspnea We have discussed optimization of his medications and medical conditions with the aid of improving his cardiac function. Daily weights, limit fluid intake to 2 L/day EKG performed today is unchanged from previous-reveals LVH, left atrial enlargement, prolonged QT - furosemide (LASIX) 20 MG tablet; Take 1 tablet (20 mg total) by mouth daily.  Dispense: 30 tablet; Refill: 3  3. Essential hypertension Uncontrolled He does have Coreg CR 10 mg and Coreg 6.25 mg twice daily on his med list and is unsure what he is taking I have discontinued  Coreg CR We will reassess blood pressure at next visit Counseled on blood pressure goal of less than 130/80, low-sodium, DASH diet, medication compliance, 150 minutes of moderate intensity exercise per week. Discussed medication compliance, adverse effects. - carvedilol (COREG) 6.25 MG tablet; Take 1 tablet (6.25 mg total) by mouth 2 (two) times daily with a meal.  Dispense: 60 tablet; Refill: 3 - lisinopril (PRINIVIL,ZESTRIL) 10 MG tablet; Take 1 tablet (10 mg total) by mouth daily.  Dispense: 30 tablet; Refill: 3  4. Other male erectile dysfunction Advised that we will need to optimize his diabetes and hypertension and we will discuss his erectile dysfunction at the subsequent visit  5. S/P colostomy (HCC) Closely followed by general surgery with plans for revision in the near future  6. Anxiety and depression Secondary to psychosocial stressors; palpitations could be due to his anxiety Declines initiation of medications Declines psychotherapy by LCSW and promises to reach out to the clinic in the event that he needs help.   Meds ordered this encounter  Medications  . empagliflozin (JARDIANCE) 10 MG TABS tablet    Sig: Take 10 mg by mouth daily.    Dispense:  30 tablet    Refill:  3  . carvedilol (COREG) 6.25 MG tablet    Sig: Take 1 tablet (6.25 mg total) by mouth 2 (two) times daily with a meal.    Dispense:  60 tablet    Refill:  3    Discontinue Coreg CR 10mg   . furosemide (LASIX) 20 MG tablet    Sig: Take 1 tablet (20 mg total) by mouth daily.    Dispense:  30 tablet    Refill:  3  . lisinopril (PRINIVIL,ZESTRIL) 10 MG tablet    Sig: Take 1 tablet (10 mg total) by mouth daily.    Dispense:  30 tablet    Refill:  3  . metFORMIN (GLUCOPHAGE) 500 MG tablet    Sig: Take 2 tablets (1,000 mg total) by mouth 2 (two) times daily with a meal.    Dispense:  120 tablet    Refill:  3    Follow-up: Return in about 3 months (  around 05/09/2018) for Follow-up of chronic medical  conditions.   Hoy RegisterEnobong Azayla Polo MD

## 2018-02-17 ENCOUNTER — Ambulatory Visit: Payer: Self-pay | Admitting: Cardiology

## 2018-03-09 MED FILL — LISINOPRIL 10 MG TABS: 10 | 30 days supply | Qty: 30 | Fill #2

## 2018-03-09 MED FILL — ?ATORVASTATIN 10 MG TABLET: 10 | 30 days supply | Qty: 30 | Fill #2

## 2018-03-28 ENCOUNTER — Ambulatory Visit: Payer: Self-pay | Admitting: Gastroenterology

## 2018-03-28 ENCOUNTER — Encounter (INDEPENDENT_AMBULATORY_CARE_PROVIDER_SITE_OTHER): Payer: Self-pay

## 2018-03-28 ENCOUNTER — Encounter: Payer: Self-pay | Admitting: Gastroenterology

## 2018-03-28 VITALS — BP 130/90 | HR 76 | Ht 69.0 in | Wt 209.4 lb

## 2018-03-28 DIAGNOSIS — Z933 Colostomy status: Secondary | ICD-10-CM

## 2018-03-28 DIAGNOSIS — Z1211 Encounter for screening for malignant neoplasm of colon: Secondary | ICD-10-CM

## 2018-03-28 DIAGNOSIS — Z1212 Encounter for screening for malignant neoplasm of rectum: Secondary | ICD-10-CM

## 2018-03-28 MED ORDER — PEG-KCL-NACL-NASULF-NA ASC-C 140 G PO SOLR: 1.0000 | Freq: Once | ORAL | 0 refills | Status: AC

## 2018-03-28 NOTE — Patient Instructions (Signed)
You have been scheduled for a colonoscopy. Please follow written instructions given to you at your visit today.  Please pick up your prep supplies at the pharmacy within the next 1-3 days. If you use inhalers (even only as needed), please bring them with you on the day of your procedure. Your physician has requested that you go to www.startemmi.com and enter the access code given to you at your visit today. This web site gives a general overview about your procedure. However, you should still follow specific instructions given to you by our office regarding your preparation for the procedure.  Normal BMI (Body Mass Index- based on height and weight) is between 19 and 25. Your BMI today is Body mass index is 30.92 kg/m. . Please consider follow up  regarding your BMI with your Primary Care Provider.  Thank you for choosing me and [facility].  Malcolm T. Stark, Jr., MD., FACG  

## 2018-03-28 NOTE — Progress Notes (Signed)
History of Present Illness: This is a 52 year old referred by Hoy RegisterNewlin, Enobong, MD and Feliciana RossettiLuke [name], MD for the evaluation S/P partial colectomy with end colostomy for perforated diverticulitis. Dr. Sheliah HatchKinsinger requests colonoscopy prior to planned colostomy takedown.  Patient has no gastrointestinal complaints.  No prior colonoscopy.  Denies weight loss, abdominal pain, constipation, diarrhea, change in stool caliber, melena, hematochezia, nausea, vomiting, dysphagia, reflux symptoms, chest pain.     No Known Allergies Outpatient Medications Prior to Visit  Medication Sig Dispense Refill  . atorvastatin (LIPITOR) 10 MG tablet Take 1 tablet (10 mg total) by mouth daily. 30 tablet 3  . carvedilol (COREG) 6.25 MG tablet Take 1 tablet (6.25 mg total) by mouth 2 (two) times daily with a meal. 60 tablet 3  . empagliflozin (JARDIANCE) 10 MG TABS tablet Take 10 mg by mouth daily. 30 tablet 3  . furosemide (LASIX) 20 MG tablet Take 1 tablet (20 mg total) by mouth daily. 30 tablet 3  . lisinopril (PRINIVIL,ZESTRIL) 10 MG tablet Take 1 tablet (10 mg total) by mouth daily. 30 tablet 3  . metFORMIN (GLUCOPHAGE) 500 MG tablet Take 2 tablets (1,000 mg total) by mouth 2 (two) times daily with a meal. 120 tablet 3  . polyethylene glycol (MIRALAX / GLYCOLAX) packet Take 17 g by mouth daily. (Patient taking differently: Take 17 g by mouth daily as needed. ) 14 each 0  . feeding supplement, GLUCERNA SHAKE, (GLUCERNA SHAKE) LIQD Take 237 mLs by mouth 3 (three) times daily between meals. 90 Can 0   No facility-administered medications prior to visit.    Past Medical History:  Diagnosis Date  . CHF (congestive heart failure) (HCC)   . Diabetes mellitus without complication (HCC)   . Diverticulitis   . Hypertension    Past Surgical History:  Procedure Laterality Date  . COLON RESECTION N/A 09/05/2017   Procedure: HARTMAN'S COLECTOMY AND COLOSTOMY;  Surgeon: Sheliah HatchKinsinger, De BlanchLuke Aaron, MD;  Location: [facility] OR;   Service: General;  Laterality: N/A;   Social History   Socioeconomic History  . Marital status: Single    Spouse name: Not on file  . Number of children: 3  . Years of education: Not on file  . Highest education level: Not on file  Occupational History  . Occupation: Disabled  Social Needs  . Financial resource strain: Not on file  . Food insecurity:    Worry: Not on file    Inability: Not on file  . Transportation needs:    Medical: Not on file    Non-medical: Not on file  Tobacco Use  . Smoking status: Former Games developermoker  . Smokeless tobacco: Never Used  Substance and Sexual Activity  . Alcohol use: Yes  . Drug use: No  . Sexual activity: Yes    Partners: Female  Lifestyle  . Physical activity:    Days per week: Not on file    Minutes per session: Not on file  . Stress: Not on file  Relationships  . Social connections:    Talks on phone: Not on file    Gets together: Not on file    Attends religious service: Not on file    Active member of club or organization: Not on file    Attends meetings of clubs or organizations: Not on file    Relationship status: Not on file  Other Topics Concern  . Not on file  Social History Narrative  . Not on file   Family History  Problem Relation Age of Onset  . Heart disease Mother   . Heart disease Sister   . Diabetes Maternal Aunt   . Heart disease Maternal Aunt   . Diabetes Maternal Uncle   . Sudden Cardiac Death Neg Hx      Review of Systems: Pertinent positive and negative review of systems were noted in the above HPI section. All other review of systems were otherwise negative.    Physical Exam: General: Well developed, well nourished, no acute distress Head: Normocephalic and atraumatic Eyes:  sclerae anicteric, EOMI Ears: Normal auditory acuity Mouth: No deformity or lesions Neck: Supple, no masses or thyromegaly Lungs: Clear throughout to auscultation Heart: Regular rate and rhythm; no murmurs, rubs or  bruits Abdomen: Soft, non tender and non distended.  Colostomy in left lower quadrant appears healthy.  No masses, hepatosplenomegaly or hernias noted. Normal Bowel sounds Rectal: Deferred to colonoscopy Musculoskeletal: Symmetrical with no gross deformities  Skin: No lesions on visible extremities Pulses:  Normal pulses noted Extremities: No clubbing, cyanosis, edema or deformities noted Neurological: Alert oriented x 4, grossly nonfocal Cervical Nodes:  No significant cervical adenopathy Inguinal Nodes: No significant inguinal adenopathy Psychological:  Alert and cooperative. Normal mood and affect  Assessment and Recommendations:  1.  Status post partial colectomy and end colostomy for perforated diverticulitis in January.  He has not undergone colonoscopy for colorectal screening.  Schedule colonoscopy. The risks (including bleeding, perforation, infection, missed lesions, medication reactions and possible hospitalization or surgery if complications occur), benefits, and alternatives to colonoscopy with possible biopsy and possible polypectomy were discussed with the patient and they consent to proceed.     cc: Hoy RegisterNewlin, Enobong, MD [address] [place], [place] [id number]

## 2018-03-29 ENCOUNTER — Encounter: Payer: Self-pay | Admitting: Cardiology

## 2018-03-29 ENCOUNTER — Ambulatory Visit (INDEPENDENT_AMBULATORY_CARE_PROVIDER_SITE_OTHER): Payer: Self-pay | Admitting: Cardiology

## 2018-03-29 ENCOUNTER — Ambulatory Visit (HOSPITAL_BASED_OUTPATIENT_CLINIC_OR_DEPARTMENT_OTHER)
Admission: RE | Admit: 2018-03-29 | Discharge: 2018-03-29 | Disposition: A | Discharge status: Home / Self Care | Payer: Medicaid Other | Source: Ambulatory Visit | Attending: Cardiology | Admitting: Cardiology

## 2018-03-29 VITALS — BP 130/74 | HR 83 | Ht 69.0 in | Wt 211.0 lb

## 2018-03-29 DIAGNOSIS — I209 Angina pectoris, unspecified: Secondary | ICD-10-CM | POA: Insufficient documentation

## 2018-03-29 DIAGNOSIS — I200 Unstable angina: Secondary | ICD-10-CM

## 2018-03-29 DIAGNOSIS — Z01818 Encounter for other preprocedural examination: Secondary | ICD-10-CM

## 2018-03-29 DIAGNOSIS — E119 Type 2 diabetes mellitus without complications: Secondary | ICD-10-CM

## 2018-03-29 DIAGNOSIS — R0789 Other chest pain: Secondary | ICD-10-CM

## 2018-03-29 DIAGNOSIS — I10 Essential (primary) hypertension: Secondary | ICD-10-CM

## 2018-03-29 DIAGNOSIS — R002 Palpitations: Secondary | ICD-10-CM | POA: Insufficient documentation

## 2018-03-29 HISTORY — DX: Other chest pain: R07.89

## 2018-03-29 HISTORY — DX: Palpitations: R00.2

## 2018-03-29 HISTORY — DX: Unstable angina: I20.0

## 2018-03-29 HISTORY — DX: Angina pectoris, unspecified: I20.9

## 2018-03-29 MED ORDER — ASPIRIN EC 81 MG PO TBEC: 81.0000 mg | DELAYED_RELEASE_TABLET | Freq: Every day | ORAL | 3 refills | Status: DC

## 2018-03-29 MED ORDER — NITROGLYCERIN 0.4 MG SL SUBL: 0.4000 mg | SUBLINGUAL_TABLET | SUBLINGUAL | 11 refills | Status: DC | PRN

## 2018-03-29 NOTE — Addendum Note (Signed)
Addended by: Crist FatLOCKHART, Ladarian Bonczek P on: 03/29/2018 11:16 AM   Modules accepted: Orders

## 2018-03-29 NOTE — Addendum Note (Signed)
Addended by: Crist FatLOCKHART, Aliviana Burdell P on: 03/29/2018 11:09 AM   Modules accepted: Orders

## 2018-03-29 NOTE — Progress Notes (Signed)
Cardiology Office Note:    Date:  03/29/2018   ID:  Kenneth Rios, DOB 1965/12/23, MRN [id number]  PCP:  Kenneth Rios, Enobong, Kenneth Rios  Cardiologist:  Kenneth Brothersajan R Oreste Majeed, Kenneth Rios   Referring Kenneth Rios: Kenneth Rios, Enobong, Kenneth Rios    ASSESSMENT:    1. Angina pectoris (HCC)   2. Essential hypertension   3. Diabetes mellitus without complication (HCC)   4. Chest tightness   5. Palpitations    PLAN:    In order of problems listed above:  1. Primary prevention stressed with the patient.  Importance of compliance with diet and medication stressed and he vocalized understanding.  His blood pressure is stable.  Diet was discussed for dyslipidemia and diabetes mellitus. 2. Sublingual nitroglycerin prescription was sent, its protocol and 911 protocol explained and the patient vocalized understanding questions were answered to the patient's satisfaction 3. In view of the patient's symptoms, I discussed with the patient options for evaluation. Invasive and noninvasive options were given to the patient. I discussed stress testing and coronary angiography and left heart catheterization at length. Benefits, pros and cons of each approach were discussed at length. Patient had multiple questions which were answered to the patient's satisfaction. Patient opted for invasive evaluation and we will set up for coronary angiography and left heart catheterization. Further recommendations will be made based on the findings with coronary angiography. In the interim if the patient has any significant symptoms in hospital to the nearest emergency room. 4. I will obtain a  magnesium level in view of his prolonged QT in the past EKG. 5. Sublingual nitroglycerin prescription was sent, its protocol and 911 protocol explained and the patient vocalized understanding questions were answered to the patient's satisfaction   Medication Adjustments/Labs and Tests Ordered: Current medicines are reviewed at length with the patient today.  Concerns  regarding medicines are outlined above.  No orders of the defined types were placed in this encounter.  No orders of the defined types were placed in this encounter.    History of Present Illness:    Kenneth Rios is a 52 y.o. male who is being seen today for the evaluation of cardiomyopathy and angina pectoris at the request of Kenneth Rios, Enobong, Kenneth Rios.  Patient is a pleasant 52 year old male.  He is accompanied by his family member.  He has past medical history of essential hypertension, diabetes mellitus and dyslipidemia.  He leads a sedentary lifestyle.  He mentions to me that he was recently diagnosed to have congestive heart failure.  Echocardiogram reveals mildly depressed left ventricular systolic function.  The patient mentions to me that his shortness of breath and chest tightness on exertion which is very concerning.  No radiation to the neck or to the arms.  He does not have any nitroglycerin with him at this time.  He also leads a sedentary lifestyle and is not sexually active because of erectile dysfunction.  He comes to me with also history of palpitations occurring on and off.  At the time of my evaluation, the patient is alert awake oriented and in no distress.  Past Medical History:  Diagnosis Date  . CHF (congestive heart failure) (HCC)   . Diabetes mellitus without complication (HCC)   . Diverticulitis   . Hypertension     Past Surgical History:  Procedure Laterality Date  . COLON RESECTION N/A 09/05/2017   Procedure: HARTMAN'S COLECTOMY AND COLOSTOMY;  Surgeon: Sheliah HatchKinsinger, De BlanchLuke Aaron, Kenneth Rios;  [facility];  Service: General;  Laterality: N/A;  Current Medications: Current Meds  Medication Sig  . atorvastatin (LIPITOR) 10 MG tablet Take 1 tablet (10 mg total) by mouth daily.  . carvedilol (COREG) 6.25 MG tablet Take 1 tablet (6.25 mg total) by mouth 2 (two) times daily with a meal.  . empagliflozin (JARDIANCE) 10 MG TABS tablet Take 10 mg by mouth daily.  .  furosemide (LASIX) 20 MG tablet Take 1 tablet (20 mg total) by mouth daily.  Marland Kitchen. lisinopril (PRINIVIL,ZESTRIL) 10 MG tablet Take 1 tablet (10 mg total) by mouth daily.  . metFORMIN (GLUCOPHAGE) 500 MG tablet Take 2 tablets (1,000 mg total) by mouth 2 (two) times daily with a meal.  . polyethylene glycol (MIRALAX / GLYCOLAX) packet Take 17 g by mouth daily. (Patient taking differently: Take 17 g by mouth daily as needed. )     Allergies:   Patient has no known allergies.   Social History   Socioeconomic History  . Marital status: Single    Spouse name: Not on file  . Number of children: 3  . Years of education: Not on file  . Highest education level: Not on file  Occupational History  . Occupation: Disabled  Social Needs  . Financial resource strain: Not on file  . Food insecurity:    Worry: Not on file    Inability: Not on file  . Transportation needs:    Medical: Not on file    Non-medical: Not on file  Tobacco Use  . Smoking status: Former Games developermoker  . Smokeless tobacco: Never Used  Substance and Sexual Activity  . Alcohol use: Yes  . Drug use: No  . Sexual activity: Yes    Partners: Female  Lifestyle  . Physical activity:    Days per week: Not on file    Minutes per session: Not on file  . Stress: Not on file  Relationships  . Social connections:    Talks on phone: Not on file    Gets together: Not on file    Attends religious service: Not on file    Active member of club or organization: Not on file    Attends meetings of clubs or organizations: Not on file    Relationship status: Not on file  Other Topics Concern  . Not on file  Social History Narrative  . Not on file     Family History: The patient's family history includes Diabetes in his maternal aunt and maternal uncle; Heart disease in his maternal aunt, mother, and sister. There is no history of Sudden Cardiac Death.  ROS:   Please see the history of present illness.    All other systems reviewed and  are negative.  EKGs/Labs/Other Studies Reviewed:    The following studies were reviewed today: I discussed the findings of my evaluation with the patient at length and he vocalized understanding.  The records were reviewed including the EKG.   Recent Labs: 08/28/2017: B Natriuretic Peptide 57.3 08/29/2017: Magnesium 2.4 09/12/2017: ALT 30; BUN 11; Creatinine, Ser 0.89; Hemoglobin 11.2; Platelets 334; Potassium 4.1; Sodium 138  Recent Lipid Panel No results found for: CHOL, TRIG, HDL, CHOLHDL, VLDL, LDLCALC, LDLDIRECT  Physical Exam:    VS:  BP 130/74   Pulse 83   Ht 5\' 9"  (1.753 m)   Wt 211 lb (95.7 kg)   SpO2 99%   BMI 31.16 kg/m     Wt Readings from Last 3 Encounters:  03/29/18 211 lb (95.7 kg)  03/28/18 209 lb 6 oz (95 kg)  02/06/18 216 lb 9.6 oz (98.2 kg)     GEN: Patient is in no acute distress HEENT: Normal NECK: No JVD; No carotid bruits LYMPHATICS: No lymphadenopathy CARDIAC: S1 S2 regular, 2/6 systolic murmur at the apex. RESPIRATORY:  Clear to auscultation without rales, wheezing or rhonchi  ABDOMEN: Soft, non-tender, non-distended MUSCULOSKELETAL:  No edema; No deformity  SKIN: Warm and dry NEUROLOGIC:  Alert and oriented x 3 PSYCHIATRIC:  Normal affect    Signed, Kenneth Brothers, Kenneth Rios  03/29/2018 10:30 AM    [facility]

## 2018-03-29 NOTE — H&P (View-Only) (Signed)
Cardiology Office Note:    Date:  03/29/2018   ID:  Kenneth Rios, DOB 07/19/1966, MRN [id number]  PCP:  Newlin, Enobong, MD  Cardiologist:  Ardit Danh R Jaxsyn Azam, MD   Referring MD: Newlin, Enobong, MD    ASSESSMENT:    1. Angina pectoris (HCC)   2. Essential hypertension   3. Diabetes mellitus without complication (HCC)   4. Chest tightness   5. Palpitations    PLAN:    In order of problems listed above:  1. Primary prevention stressed with the patient.  Importance of compliance with diet and medication stressed and he vocalized understanding.  His blood pressure is stable.  Diet was discussed for dyslipidemia and diabetes mellitus. 2. Sublingual nitroglycerin prescription was sent, its protocol and 911 protocol explained and the patient vocalized understanding questions were answered to the patient's satisfaction 3. In view of the patient's symptoms, I discussed with the patient options for evaluation. Invasive and noninvasive options were given to the patient. I discussed stress testing and coronary angiography and left heart catheterization at length. Benefits, pros and cons of each approach were discussed at length. Patient had multiple questions which were answered to the patient's satisfaction. Patient opted for invasive evaluation and we will set up for coronary angiography and left heart catheterization. Further recommendations will be made based on the findings with coronary angiography. In the interim if the patient has any significant symptoms in hospital to the nearest emergency room. 4. I will obtain a  magnesium level in view of his prolonged QT in the past EKG. 5. Sublingual nitroglycerin prescription was sent, its protocol and 911 protocol explained and the patient vocalized understanding questions were answered to the patient's satisfaction   Medication Adjustments/Labs and Tests Ordered: Current medicines are reviewed at length with the patient today.  Concerns  regarding medicines are outlined above.  No orders of the defined types were placed in this encounter.  No orders of the defined types were placed in this encounter.    History of Present Illness:    Kenneth Rios is a 52 y.o. male who is being seen today for the evaluation of cardiomyopathy and angina pectoris at the request of Newlin, Enobong, MD.  Patient is a pleasant 52-year-old male.  He is accompanied by his family member.  He has past medical history of essential hypertension, diabetes mellitus and dyslipidemia.  He leads a sedentary lifestyle.  He mentions to me that he was recently diagnosed to have congestive heart failure.  Echocardiogram reveals mildly depressed left ventricular systolic function.  The patient mentions to me that his shortness of breath and chest tightness on exertion which is very concerning.  No radiation to the neck or to the arms.  He does not have any nitroglycerin with him at this time.  He also leads a sedentary lifestyle and is not sexually active because of erectile dysfunction.  He comes to me with also history of palpitations occurring on and off.  At the time of my evaluation, the patient is alert awake oriented and in no distress.  Past Medical History:  Diagnosis Date  . CHF (congestive heart failure) (HCC)   . Diabetes mellitus without complication (HCC)   . Diverticulitis   . Hypertension     Past Surgical History:  Procedure Laterality Date  . COLON RESECTION N/A 09/05/2017   Procedure: HARTMAN'S COLECTOMY AND COLOSTOMY;  Surgeon: [name], Luke Aaron, MD;  Location: [facility] OR;  Service: General;  Laterality: N/A;      Current Medications: Current Meds  Medication Sig  . atorvastatin (LIPITOR) 10 MG tablet Take 1 tablet (10 mg total) by mouth daily.  . carvedilol (COREG) 6.25 MG tablet Take 1 tablet (6.25 mg total) by mouth 2 (two) times daily with a meal.  . empagliflozin (JARDIANCE) 10 MG TABS tablet Take 10 mg by mouth daily.  .  furosemide (LASIX) 20 MG tablet Take 1 tablet (20 mg total) by mouth daily.  . lisinopril (PRINIVIL,ZESTRIL) 10 MG tablet Take 1 tablet (10 mg total) by mouth daily.  . metFORMIN (GLUCOPHAGE) 500 MG tablet Take 2 tablets (1,000 mg total) by mouth 2 (two) times daily with a meal.  . polyethylene glycol (MIRALAX / GLYCOLAX) packet Take 17 g by mouth daily. (Patient taking differently: Take 17 g by mouth daily as needed. )     Allergies:   Patient has no known allergies.   Social History   Socioeconomic History  . Marital status: Single    Spouse name: Not on file  . Number of children: 3  . Years of education: Not on file  . Highest education level: Not on file  Occupational History  . Occupation: Disabled  Social Needs  . Financial resource strain: Not on file  . Food insecurity:    Worry: Not on file    Inability: Not on file  . Transportation needs:    Medical: Not on file    Non-medical: Not on file  Tobacco Use  . Smoking status: Former Smoker  . Smokeless tobacco: Never Used  Substance and Sexual Activity  . Alcohol use: Yes  . Drug use: No  . Sexual activity: Yes    Partners: Female  Lifestyle  . Physical activity:    Days per week: Not on file    Minutes per session: Not on file  . Stress: Not on file  Relationships  . Social connections:    Talks on phone: Not on file    Gets together: Not on file    Attends religious service: Not on file    Active member of club or organization: Not on file    Attends meetings of clubs or organizations: Not on file    Relationship status: Not on file  Other Topics Concern  . Not on file  Social History Narrative  . Not on file     Family History: The patient's family history includes Diabetes in his maternal aunt and maternal uncle; Heart disease in his maternal aunt, mother, and sister. There is no history of Sudden Cardiac Death.  ROS:   Please see the history of present illness.    All other systems reviewed and  are negative.  EKGs/Labs/Other Studies Reviewed:    The following studies were reviewed today: I discussed the findings of my evaluation with the patient at length and he vocalized understanding.  The records were reviewed including the EKG.   Recent Labs: 08/28/2017: B Natriuretic Peptide 57.3 08/29/2017: Magnesium 2.4 09/12/2017: ALT 30; BUN 11; Creatinine, Ser 0.89; Hemoglobin 11.2; Platelets 334; Potassium 4.1; Sodium 138  Recent Lipid Panel No results found for: CHOL, TRIG, HDL, CHOLHDL, VLDL, LDLCALC, LDLDIRECT  Physical Exam:    VS:  BP 130/74   Pulse 83   Ht 5' 9" (1.753 m)   Wt 211 lb (95.7 kg)   SpO2 99%   BMI 31.16 kg/m     Wt Readings from Last 3 Encounters:  03/29/18 211 lb (95.7 kg)  03/28/18 209 lb 6 oz (95 kg)    02/06/18 216 lb 9.6 oz (98.2 kg)     GEN: Patient is in no acute distress HEENT: Normal NECK: No JVD; No carotid bruits LYMPHATICS: No lymphadenopathy CARDIAC: S1 S2 regular, 2/6 systolic murmur at the apex. RESPIRATORY:  Clear to auscultation without rales, wheezing or rhonchi  ABDOMEN: Soft, non-tender, non-distended MUSCULOSKELETAL:  No edema; No deformity  SKIN: Warm and dry NEUROLOGIC:  Alert and oriented x 3 PSYCHIATRIC:  Normal affect    Signed, Decorey Wahlert R Lynell Kussman, MD  03/29/2018 10:30 AM    [facility]   

## 2018-03-29 NOTE — Patient Instructions (Addendum)
Medication Instructions:  Your physician has recommended you make the following change in your medication:  START aspirin 81 mg daily: samples provided  START nitroglycerin as needed for chest pain: When having chest pain, stop what you are doing and sit down. Take 1 nitro, wait 5 minutes. Still having chest pain, take 1 nitro, wait 5 minutes. Still having chest pain, take 1 nitro, dial 911. Total of 3 nitro in 15 minutes.   Labwork: Your physician recommends that you return for lab work today: TSH, Magnesium, CBC, BMP.   Testing/Procedures: You had an EKG today.   A chest x-ray takes a picture of the organs and structures inside the chest, including the heart, lungs, and blood vessels. This test can show several things, including, whether the heart is enlarges; whether fluid is building up in the lungs; and whether pacemaker / defibrillator leads are still in place.  Your physician has recommended that you wear an event monitor. Event monitors are medical devices that record the heart's electrical activity. Doctors most often us these monitors to diagnose arrhythmias. Arrhythmias are problems with the speed or rhythm of the heartbeat. The monitor is a small, portable device. You can wear one while you do your normal daily activities. This is usually used to diagnose what is causing palpitations/syncope (passing out). Wear for 30 days.      [facility] [place] [address], [address] [place] [id number] Dept: [phone] Loc: [phone]  Kenneth Rios  03/29/2018  You are scheduled for a Cardiac Catheterization on Thursday, August 22 with Dr. Cristal Deerhristopher End.  1. Please arrive [facility] at [facility]: [address] [place], [place] [address] at 12:00 PM (This time is two hours before your procedure to ensure your preparation). Free valet parking service is available.    Special note: Every effort is made to have your procedure done on time. Please understand that emergencies sometimes delay scheduled procedures.  2. Diet: Do not eat solid foods after midnight.  The patient may have clear liquids until 5am upon the day of the procedure.  3. Labs: None needed.  4. Medication instructions in preparation for your procedure:   Contrast Allergy: No  Stop taking furosemide (lasix) on Thursday, 03/30/18.   Stop Taking PO Diabetes Meds Glucophage (Metformin)on Thursday, August 22. Do not resume this medication until 48 hours after cardiac catheterization is completed. Please hold your jardiance the morning of your procedure and you can resume this medication as prescribed once procedure is completed.   On the morning of your procedure, take your Aspirin and any morning medicines NOT listed above.  You may use sips of water.  5. Plan for one night stay--bring personal belongings. 6. Bring a current list of your medications and current insurance cards. 7. You MUST have a responsible person to drive you home. 8. Someone MUST be with you the first 24 hours after you arrive home or your discharge will be delayed. 9. Please wear clothes that are easy to get on and off and wear slip-on shoes.  Thank you for allowing us to care for you!   -- [name]   Follow-Up: Your physician wants you to follow-up in: 4 months. You will receive a reminder letter in the mail two months in advance. If you don't receive a letter, please call our office to schedule the follow-up appointment.   If  you need a refill on your cardiac medications before your next appointment, please call your pharmacy.   Thank you for choosing [facility]! Mady Gemma, RN [phone]    Aspirin and Your Heart Aspirin is a medicine that affects the way blood clots. Aspirin can be used to help reduce the risk of blood clots, heart attacks, and other  heart-related problems. Should I take aspirin? Your health care provider will help you determine whether it is safe and beneficial for you to take aspirin daily. Taking aspirin daily may be beneficial if you:  Have had a heart attack or chest pain.  Have undergone open heart surgery such as coronary artery bypass surgery (CABG).  Have had coronary angioplasty.  Have experienced a stroke or transient ischemic attack (TIA).  Have peripheral vascular disease (PVD).  Have chronic heart rhythm problems such as atrial fibrillation.  Are there any risks of taking aspirin daily? Daily use of aspirin can increase your risk of side effects. Some of these include:  Bleeding. Bleeding problems can be minor or serious. An example of a minor problem is a cut that does not stop bleeding. An example of a more serious problem is stomach bleeding or bleeding into the brain. Your risk of bleeding is increased if you are also taking non-steroidal anti-inflammatory medicine (NSAIDs).  Increased bruising.  Upset stomach.  An allergic reaction. People who have nasal polyps have an increased risk of developing an aspirin allergy.  What are some guidelines I should follow when taking aspirin?  Take aspirin only as directed by your health care provider. Make sure you understand how much you should take and what form you should take. The two forms of aspirin are: ? Non-enteric-coated. This type of aspirin does not have a coating and is absorbed quickly. Non-enteric-coated aspirin is usually recommended for people with chest pain. This type of aspirin also comes in a chewable form. ? Enteric-coated. This type of aspirin has a special coating that releases the medicine very slowly. Enteric-coated aspirin causes less stomach upset than non-enteric-coated aspirin. This type of aspirin should not be chewed or crushed.  Drink alcohol in moderation. Drinking alcohol increases your risk of bleeding. When should I  seek medical care?  You have unusual bleeding or bruising.  You have stomach pain.  You have an allergic reaction. Symptoms of an allergic reaction include: ? Hives. ? Itchy skin. ? Swelling of the lips, tongue, or face.  You have ringing in your ears. When should I seek immediate medical care?  Your bowel movements are bloody, dark red, or black in color.  You vomit or cough up blood.  You have blood in your urine.  You cough, wheeze, or feel short of breath. If you have any of the following symptoms, this is an emergency. Do not wait to see if the pain will go away. Get medical help at once. Call your local emergency services (911 in the [place] Do not drive yourself to the hospital.  You have severe chest pain, especially if the pain is crushing or pressure-like and spreads to the arms, back, neck, or jaw.  You have stroke-like symptoms, such as: ? Loss of vision. ? Difficulty talking. ? Numbness or weakness on one side of your body. ? Numbness or weakness in your arm or leg. ? Not thinking clearly or feeling confused.  This information is not intended to replace advice given to you by your health care provider. Make sure you discuss any questions you have  with your health care provider. Document Released: 07/08/2008 Document Revised: 12/03/2015 Document Reviewed: 10/31/2013 Elsevier Interactive Patient Education  2018 [employer].   Nitroglycerin sublingual tablets What is this medicine? NITROGLYCERIN (nye troe GLI ser in) is a type of vasodilator. It relaxes blood vessels, increasing the blood and oxygen supply to your heart. This medicine is used to relieve chest pain caused by angina. It is also used to prevent chest pain before activities like climbing stairs, going outdoors in cold weather, or sexual activity. This medicine may be used for other purposes; ask your health care provider or pharmacist if you have questions. COMMON BRAND NAME(S): Nitroquick, Nitrostat,  Nitrotab What should I tell my health care provider before I take this medicine? They need to know if you have any of these conditions: -anemia -head injury, recent stroke, or bleeding in the brain -liver disease -previous heart attack -an unusual or allergic reaction to nitroglycerin, other medicines, foods, dyes, or preservatives -pregnant or trying to get pregnant -breast-feeding How should I use this medicine? Take this medicine by mouth as needed. At the first sign of an angina attack (chest pain or tightness) place one tablet under your tongue. You can also take this medicine 5 to 10 minutes before an event likely to produce chest pain. Follow the directions on the prescription label. Let the tablet dissolve under the tongue. Do not swallow whole. Replace the dose if you accidentally swallow it. It will help if your mouth is not dry. Saliva around the tablet will help it to dissolve more quickly. Do not eat or drink, smoke or chew tobacco while a tablet is dissolving. If you are not better within 5 minutes after taking ONE dose of nitroglycerin, call 9-1-1 immediately to seek emergency medical care. Do not take more than 3 nitroglycerin tablets over 15 minutes. If you take this medicine often to relieve symptoms of angina, your doctor or health care professional may provide you with different instructions to manage your symptoms. If symptoms do not go away after following these instructions, it is important to call 9-1-1 immediately. Do not take more than 3 nitroglycerin tablets over 15 minutes. Talk to your pediatrician regarding the use of this medicine in children. Special care may be needed. Overdosage: If you think you have taken too much of this medicine contact a poison control center or emergency room at once. NOTE: This medicine is only for you. Do not share this medicine with others. What if I miss a dose? This does not apply. This medicine is only used as needed. What may interact  with this medicine? Do not take this medicine with any of the following medications: -certain migraine medicines like ergotamine and dihydroergotamine (DHE) -medicines used to treat erectile dysfunction like sildenafil, tadalafil, and vardenafil -riociguat This medicine may also interact with the following medications: -alteplase -aspirin -heparin -medicines for high blood pressure -medicines for mental depression -other medicines used to treat angina -phenothiazines like chlorpromazine, mesoridazine, prochlorperazine, thioridazine This list may not describe all possible interactions. Give your health care provider a list of all the medicines, herbs, non-prescription drugs, or dietary supplements you use. Also tell them if you smoke, drink alcohol, or use illegal drugs. Some items may interact with your medicine. What should I watch for while using this medicine? Tell your doctor or health care professional if you feel your medicine is no longer working. Keep this medicine with you at all times. Sit or lie down when you take your medicine to prevent  falling if you feel dizzy or faint after using it. Try to remain calm. This will help you to feel better faster. If you feel dizzy, take several deep breaths and lie down with your feet propped up, or bend forward with your head resting between your knees. You may get drowsy or dizzy. Do not drive, use machinery, or do anything that needs mental alertness until you know how this drug affects you. Do not stand or sit up quickly, especially if you are an older patient. This reduces the risk of dizzy or fainting spells. Alcohol can make you more drowsy and dizzy. Avoid alcoholic drinks. Do not treat yourself for coughs, colds, or pain while you are taking this medicine without asking your doctor or health care professional for advice. Some ingredients may increase your blood pressure. What side effects may I notice from receiving this medicine? Side  effects that you should report to your doctor or health care professional as soon as possible: -blurred vision -dry mouth -skin rash -sweating -the feeling of extreme pressure in the head -unusually weak or tired Side effects that usually do not require medical attention (report to your doctor or health care professional if they continue or are bothersome): -flushing of the face or neck -headache -irregular heartbeat, palpitations -nausea, vomiting This list may not describe all possible side effects. Call your doctor for medical advice about side effects. You may report side effects to FDA at 1-800-FDA-1088. Where should I keep my medicine? Keep out of the reach of children. Store at room temperature between 20 and 25 degrees C (68 and 77 degrees F). Store in Retail buyer. Protect from light and moisture. Keep tightly closed. Throw away any unused medicine after the expiration date. NOTE: This sheet is a summary. It may not cover all possible information. If you have questions about this medicine, talk to your doctor, pharmacist, or health care provider.  2018 Elsevier/Gold Standard (2013-05-24 17:57:36)    Carotid Angioplasty With Stent, Care After These instructions give you information about caring for yourself after your procedure. Your doctor may also give you more specific instructions. Call your doctor if you have any problems or questions after your procedure. Follow these instructions at home: Medicines  Take over-the-counter and prescription medicines only as told by your doctor.  If you were prescribed an antibiotic medicine, take it as told by your doctor. Do not stop taking the antibiotic even if you start to feel better. Caring for Your Cut from Surgery   Keep your cut clean and dry.  Follow instructions from your doctor about how to take care of your cut from surgery (incision). Make sure you: ? Wash your hands with soap and water before you change your  bandage (dressing). If you cannot use soap and water, use hand sanitizer. ? Change your bandage as told by your doctor. ? Leave stitches (sutures), skin glue, or skin tape (adhesive) strips in place. They may need to stay in place for 2 weeks or longer. If tape strips get loose and curl up, you may trim the loose edges. Do not remove tape strips completely unless your doctor says it is okay.  Check the area around your cut every day for signs of infection. Check for: ? More redness, swelling, or pain. ? More fluid or blood. ? Warmth. ? Pus or a bad smell. Activity  Return to your normal activities as told by your doctor. Ask your doctor what activities are safe for you.  Do not  lift anything that is heavier than 10 lb (4.5 kg) until your doctor says it is okay.  Avoid sexual activity until your doctor says that this is safe for you.  Exercise regularly, as told by your doctor. Ask your doctor what types of exercise are safe for you. Eating and drinking   Follow instructions from your doctor about what you cannot eat or drink.  Drink enough fluid to keep your pee (urine) clear or pale yellow.  Eat a heart-healthy diet. This should include lots of fresh fruits and vegetables. Meat should be lean cuts. Avoid foods that are: ? High in salt, saturated fat, or sugar. ? Canned or highly processed. ? Fried. Lifestyle  Limit alcohol intake to no more than 1 drink per day for nonpregnant women and 2 drinks per day for men. One drink equals 12 oz of beer, 5 oz of wine, or 1 oz of hard liquor.  Do not use any tobacco products, such as cigarettes, chewing tobacco, or e-cigarettes. If you need help quitting, ask your doctor.  Work with your doctor to keep your blood pressure under control.  Stay at a healthy weight. General instructions  Do not drive or use heavy machinery while taking prescription pain medicine.  Do not take baths, swim, or use a hot tub until your doctor  approves.  Tell all doctors who care for you that you have a stent.  Keep all follow-up visits as told by your doctor. This is important. Contact a doctor if:  You have more redness, swelling, or pain around your cut.  You have more fluid or blood coming from your cut.  The area around your cut feels warm to the touch.  You have pus or a bad smell coming from your cut.  You have a lump caused by bleeding under your skin (hematoma), and the lump does not go away after 2 weeks.  You have a fever. Get help right away if:  You have vision changes or loss of vision.  You have numbness or weakness on one side of your body.  You have trouble talking.  You have slurred speech or you cannot speak (aphasia).  You suddenly feel very confused.  You notice a lump caused by bleeding under your skin and the lump is quickly getting larger (expanding).  You suddenly get pain in the area where your stent was placed.  Your cut from surgery starts to bleed and does not stop after you hold pressure on it for 30 minutes. These symptoms may be an emergency. Do not wait to see if the symptoms will go away. Get help right away. Call your local emergency services (911 in [place] Do not drive yourself to the hospital. This information is not intended to replace advice given to you by your health care provider. Make sure you discuss any questions you have with your health care provider. Document Released: 07/31/2013 Document Revised: 01/01/2016 Document Reviewed: 04/20/2015 Elsevier Interactive Patient Education  [employer]2018 Elsevier Inc.

## 2018-03-30 ENCOUNTER — Ambulatory Visit (HOSPITAL_COMMUNITY)
Admission: RE | Admit: 2018-03-30 | Discharge: 2018-03-31 | Disposition: A | Discharge status: Home / Self Care | Payer: Medicaid Other | Source: Ambulatory Visit | Attending: Internal Medicine | Admitting: Internal Medicine

## 2018-03-30 ENCOUNTER — Encounter (HOSPITAL_COMMUNITY)
Admission: RE | Disposition: A | Discharge status: Home / Self Care | Payer: Self-pay | Source: Ambulatory Visit | Attending: Internal Medicine

## 2018-03-30 ENCOUNTER — Other Ambulatory Visit: Payer: Self-pay

## 2018-03-30 DIAGNOSIS — Z8249 Family history of ischemic heart disease and other diseases of the circulatory system: Secondary | ICD-10-CM | POA: Diagnosis not present

## 2018-03-30 DIAGNOSIS — R002 Palpitations: Secondary | ICD-10-CM | POA: Diagnosis not present

## 2018-03-30 DIAGNOSIS — Z933 Colostomy status: Secondary | ICD-10-CM | POA: Diagnosis not present

## 2018-03-30 DIAGNOSIS — Z833 Family history of diabetes mellitus: Secondary | ICD-10-CM | POA: Insufficient documentation

## 2018-03-30 DIAGNOSIS — Z7984 Long term (current) use of oral hypoglycemic drugs: Secondary | ICD-10-CM | POA: Insufficient documentation

## 2018-03-30 DIAGNOSIS — N529 Male erectile dysfunction, unspecified: Secondary | ICD-10-CM | POA: Diagnosis not present

## 2018-03-30 DIAGNOSIS — E785 Hyperlipidemia, unspecified: Secondary | ICD-10-CM | POA: Diagnosis not present

## 2018-03-30 DIAGNOSIS — I5032 Chronic diastolic (congestive) heart failure: Secondary | ICD-10-CM | POA: Diagnosis not present

## 2018-03-30 DIAGNOSIS — Z9049 Acquired absence of other specified parts of digestive tract: Secondary | ICD-10-CM | POA: Diagnosis not present

## 2018-03-30 DIAGNOSIS — I110 Hypertensive heart disease with heart failure: Secondary | ICD-10-CM | POA: Insufficient documentation

## 2018-03-30 DIAGNOSIS — R0789 Other chest pain: Secondary | ICD-10-CM

## 2018-03-30 DIAGNOSIS — Z87891 Personal history of nicotine dependence: Secondary | ICD-10-CM | POA: Insufficient documentation

## 2018-03-30 DIAGNOSIS — Z79899 Other long term (current) drug therapy: Secondary | ICD-10-CM | POA: Diagnosis not present

## 2018-03-30 DIAGNOSIS — I429 Cardiomyopathy, unspecified: Secondary | ICD-10-CM | POA: Insufficient documentation

## 2018-03-30 DIAGNOSIS — E781 Pure hyperglyceridemia: Secondary | ICD-10-CM | POA: Diagnosis not present

## 2018-03-30 DIAGNOSIS — I2582 Chronic total occlusion of coronary artery: Secondary | ICD-10-CM | POA: Diagnosis not present

## 2018-03-30 DIAGNOSIS — E119 Type 2 diabetes mellitus without complications: Secondary | ICD-10-CM | POA: Diagnosis not present

## 2018-03-30 DIAGNOSIS — I10 Essential (primary) hypertension: Secondary | ICD-10-CM

## 2018-03-30 DIAGNOSIS — I5030 Unspecified diastolic (congestive) heart failure: Secondary | ICD-10-CM | POA: Diagnosis present

## 2018-03-30 DIAGNOSIS — I25119 Atherosclerotic heart disease of native coronary artery with unspecified angina pectoris: Secondary | ICD-10-CM | POA: Diagnosis not present

## 2018-03-30 DIAGNOSIS — F1010 Alcohol abuse, uncomplicated: Secondary | ICD-10-CM | POA: Insufficient documentation

## 2018-03-30 DIAGNOSIS — I209 Angina pectoris, unspecified: Secondary | ICD-10-CM | POA: Diagnosis present

## 2018-03-30 HISTORY — PX: CARDIAC CATHETERIZATION: SHX172

## 2018-03-30 HISTORY — PX: INTRAVASCULAR PRESSURE WIRE/FFR STUDY: CATH118243

## 2018-03-30 HISTORY — PX: LEFT HEART CATH AND CORONARY ANGIOGRAPHY: CATH118249

## 2018-03-30 HISTORY — DX: Unspecified diastolic (congestive) heart failure: I50.30

## 2018-03-30 LAB — GLUCOSE, CAPILLARY
GLUCOSE-CAPILLARY: 346 mg/dL — AB (ref 70–99)
GLUCOSE-CAPILLARY: 208 mg/dL — AB (ref 70–99)
GLUCOSE-CAPILLARY: 350 mg/dL — AB (ref 70–99)

## 2018-03-30 LAB — BASIC METABOLIC PANEL
BUN/Creatinine Ratio: 12 (ref 9–20)
BUN: 15 mg/dL (ref 6–24)
CALCIUM: 9.8 mg/dL (ref 8.7–10.2)
CHLORIDE: 96 mmol/L (ref 96–106)
CO2: 21 mmol/L (ref 20–29)
Creatinine, Ser: 1.25 mg/dL (ref 0.76–1.27)
GFR calc non Af Amer: 66 mL/min/{1.73_m2} (ref >59)
GFR, EST AFRICAN AMERICAN: 76 mL/min/{1.73_m2} (ref >59)
GLUCOSE: 375 mg/dL — AB (ref 65–99)
POTASSIUM: 4.6 mmol/L (ref 3.5–5.2)
Sodium: 135 mmol/L (ref 134–144)

## 2018-03-30 LAB — CBC
Hematocrit: 42.2 % (ref 37.5–51.0)
Hemoglobin: 14.9 g/dL (ref 13.0–17.7)
MCH: 29.7 pg (ref 26.6–33.0)
MCHC: 35.3 g/dL (ref 31.5–35.7)
MCV: 84 fL (ref 79–97)
PLATELETS: 300 10*3/uL (ref 150–450)
RBC: 5.02 x10E6/uL (ref 4.14–5.80)
RDW: 13.7 % (ref 12.3–15.4)
WBC: 5.7 10*3/uL (ref 3.4–10.8)

## 2018-03-30 LAB — POCT ACTIVATED CLOTTING TIME: ACTIVATED CLOTTING TIME: 219 s

## 2018-03-30 LAB — TSH: TSH: 1.22 u[IU]/mL (ref 0.450–4.500)

## 2018-03-30 LAB — MAGNESIUM: MAGNESIUM: 2 mg/dL (ref 1.6–2.3)

## 2018-03-30 SURGERY — LEFT HEART CATH AND CORONARY ANGIOGRAPHY
Anesthesia: LOCAL

## 2018-03-30 MED ORDER — LIDOCAINE HCL (PF) 1 % IJ SOLN
INTRAMUSCULAR | Status: AC
  Filled 2018-03-30: qty 30

## 2018-03-30 MED ORDER — POLYETHYLENE GLYCOL 3350 17 G PO PACK
17.0000 g | PACK | Freq: Every day | ORAL | Status: DC
  Filled 2018-03-30 (×2): qty 1

## 2018-03-30 MED ORDER — INSULIN ASPART 100 UNIT/ML ~~LOC~~ SOLN
5.0000 [IU] | Freq: Once | SUBCUTANEOUS | Status: AC
  Administered 2018-03-30: 5 [IU] via SUBCUTANEOUS
  Filled 2018-03-30: qty 0.05

## 2018-03-30 MED ORDER — HEPARIN SODIUM (PORCINE) 1000 UNIT/ML IJ SOLN
INTRAMUSCULAR | Status: DC | PRN
  Administered 2018-03-30 (×2): 5000 [IU] via INTRAVENOUS
  Administered 2018-03-30: 3000 [IU] via INTRAVENOUS

## 2018-03-30 MED ORDER — SODIUM CHLORIDE 0.9 % WEIGHT BASED INFUSION: 1.0000 mL/kg/h | INTRAVENOUS | Status: DC

## 2018-03-30 MED ORDER — NITROGLYCERIN 1 MG/10 ML FOR IR/CATH LAB
INTRA_ARTERIAL | Status: DC | PRN
  Administered 2018-03-30: 200 ug via INTRACORONARY

## 2018-03-30 MED ORDER — ADENOSINE (DIAGNOSTIC) 140MCG/KG/MIN
INTRAVENOUS | Status: DC | PRN
  Administered 2018-03-30: 140 ug/kg/min via INTRAVENOUS

## 2018-03-30 MED ORDER — ASPIRIN EC 81 MG PO TBEC
81.0000 mg | DELAYED_RELEASE_TABLET | Freq: Every day | ORAL | Status: DC
  Administered 2018-03-31: 81 mg via ORAL
  Filled 2018-03-30: qty 1

## 2018-03-30 MED ORDER — SODIUM CHLORIDE 0.9% FLUSH: 3.0000 mL | INTRAVENOUS | Status: DC | PRN

## 2018-03-30 MED ORDER — CARVEDILOL 3.125 MG PO TABS
6.2500 mg | ORAL_TABLET | Freq: Two times a day (BID) | ORAL | Status: DC
  Administered 2018-03-31: 08:00:00 6.25 mg via ORAL
  Filled 2018-03-30: qty 2

## 2018-03-30 MED ORDER — HEPARIN (PORCINE) IN NACL 1000-0.9 UT/500ML-% IV SOLN
INTRAVENOUS | Status: AC
  Filled 2018-03-30: qty 1000

## 2018-03-30 MED ORDER — INSULIN ASPART 100 UNIT/ML ~~LOC~~ SOLN
0.0000 [IU] | Freq: Three times a day (TID) | SUBCUTANEOUS | Status: DC
  Administered 2018-03-31: 07:00:00 8 [IU] via SUBCUTANEOUS
  Administered 2018-03-31: 13:00:00 11 [IU] via SUBCUTANEOUS

## 2018-03-30 MED ORDER — HYDRALAZINE HCL 20 MG/ML IJ SOLN: 5.0000 mg | INTRAMUSCULAR | Status: AC | PRN

## 2018-03-30 MED ORDER — MIDAZOLAM HCL 2 MG/2ML IJ SOLN
INTRAMUSCULAR | Status: AC
  Filled 2018-03-30: qty 2

## 2018-03-30 MED ORDER — MIDAZOLAM HCL 2 MG/2ML IJ SOLN
INTRAMUSCULAR | Status: DC | PRN
  Administered 2018-03-30 (×2): 1 mg via INTRAVENOUS

## 2018-03-30 MED ORDER — IOHEXOL 350 MG/ML SOLN
INTRAVENOUS | Status: DC | PRN
  Administered 2018-03-30: 125 mL via INTRAVENOUS

## 2018-03-30 MED ORDER — VERAPAMIL HCL 2.5 MG/ML IV SOLN
INTRAVENOUS | Status: DC | PRN
  Administered 2018-03-30: 10 mL via INTRA_ARTERIAL

## 2018-03-30 MED ORDER — LISINOPRIL 10 MG PO TABS
10.0000 mg | ORAL_TABLET | Freq: Every day | ORAL | Status: DC
  Administered 2018-03-31: 10:00:00 10 mg via ORAL
  Filled 2018-03-30: qty 1

## 2018-03-30 MED ORDER — SODIUM CHLORIDE 0.9 % IV SOLN: 250.0000 mL | INTRAVENOUS | Status: DC | PRN

## 2018-03-30 MED ORDER — ATORVASTATIN CALCIUM 20 MG PO TABS: 20.0000 mg | ORAL_TABLET | Freq: Every day | ORAL | Status: DC

## 2018-03-30 MED ORDER — HEPARIN (PORCINE) IN NACL 1000-0.9 UT/500ML-% IV SOLN
INTRAVENOUS | Status: DC | PRN
  Administered 2018-03-30 (×2): 500 mL

## 2018-03-30 MED ORDER — ACETAMINOPHEN 325 MG PO TABS
650.0000 mg | ORAL_TABLET | ORAL | Status: DC | PRN
  Administered 2018-03-30: 650 mg via ORAL
  Filled 2018-03-30: qty 2

## 2018-03-30 MED ORDER — NITROGLYCERIN 0.4 MG SL SUBL: 0.4000 mg | SUBLINGUAL_TABLET | SUBLINGUAL | Status: DC | PRN

## 2018-03-30 MED ORDER — ISOSORBIDE MONONITRATE ER 30 MG PO TB24
30.0000 mg | ORAL_TABLET | Freq: Every day | ORAL | Status: DC
  Administered 2018-03-31: 10:00:00 30 mg via ORAL
  Filled 2018-03-30: qty 1

## 2018-03-30 MED ORDER — LIDOCAINE HCL (PF) 1 % IJ SOLN
INTRAMUSCULAR | Status: DC | PRN
  Administered 2018-03-30: 2 mL

## 2018-03-30 MED ORDER — INSULIN ASPART 100 UNIT/ML ~~LOC~~ SOLN
SUBCUTANEOUS | Status: AC
  Filled 2018-03-30: qty 1

## 2018-03-30 MED ORDER — LABETALOL HCL 5 MG/ML IV SOLN: 10.0000 mg | INTRAVENOUS | Status: AC | PRN

## 2018-03-30 MED ORDER — SODIUM CHLORIDE 0.9 % WEIGHT BASED INFUSION
3.0000 mL/kg/h | INTRAVENOUS | Status: DC
  Administered 2018-03-30: 3 mL/kg/h via INTRAVENOUS

## 2018-03-30 MED ORDER — HEPARIN SODIUM (PORCINE) 1000 UNIT/ML IJ SOLN
INTRAMUSCULAR | Status: AC
  Filled 2018-03-30: qty 1

## 2018-03-30 MED ORDER — SODIUM CHLORIDE 0.9% FLUSH: 3.0000 mL | Freq: Two times a day (BID) | INTRAVENOUS | Status: DC

## 2018-03-30 MED ORDER — ASPIRIN 81 MG PO CHEW
81.0000 mg | CHEWABLE_TABLET | ORAL | Status: AC
  Administered 2018-03-30: 81 mg via ORAL
  Filled 2018-03-30: qty 1

## 2018-03-30 MED ORDER — ALBUTEROL SULFATE (2.5 MG/3ML) 0.083% IN NEBU: 3.0000 mL | INHALATION_SOLUTION | Freq: Four times a day (QID) | RESPIRATORY_TRACT | Status: DC | PRN

## 2018-03-30 MED ORDER — FENTANYL CITRATE (PF) 100 MCG/2ML IJ SOLN
INTRAMUSCULAR | Status: AC
  Filled 2018-03-30: qty 2

## 2018-03-30 MED ORDER — VERAPAMIL HCL 2.5 MG/ML IV SOLN
INTRAVENOUS | Status: AC
  Filled 2018-03-30: qty 2

## 2018-03-30 MED ORDER — SODIUM CHLORIDE 0.9 % IV SOLN
INTRAVENOUS | Status: AC
  Administered 2018-03-30: 18:00:00 via INTRAVENOUS

## 2018-03-30 MED ORDER — FENTANYL CITRATE (PF) 100 MCG/2ML IJ SOLN
INTRAMUSCULAR | Status: DC | PRN
  Administered 2018-03-30: 25 ug via INTRAVENOUS
  Administered 2018-03-30: 50 ug via INTRAVENOUS

## 2018-03-30 MED ORDER — ASPIRIN 325 MG PO TABS
ORAL_TABLET | ORAL | Status: AC
  Filled 2018-03-30: qty 1

## 2018-03-30 MED ORDER — INSULIN ASPART 100 UNIT/ML ~~LOC~~ SOLN
0.0000 [IU] | Freq: Every day | SUBCUTANEOUS | Status: DC
  Administered 2018-03-30: 22:00:00 4 [IU] via SUBCUTANEOUS

## 2018-03-30 MED ORDER — ADENOSINE 12 MG/4ML IV SOLN
INTRAVENOUS | Status: AC
  Filled 2018-03-30: qty 16

## 2018-03-30 MED ORDER — ENOXAPARIN SODIUM 40 MG/0.4ML ~~LOC~~ SOLN
40.0000 mg | SUBCUTANEOUS | Status: DC
  Administered 2018-03-31: 08:00:00 40 mg via SUBCUTANEOUS
  Filled 2018-03-30: qty 0.4

## 2018-03-30 SURGICAL SUPPLY — 13 items
CATH IMPULSE 5F ANG/FL3.5 (CATHETERS) ×2 IMPLANT
CATH LAUNCHER 6FR EBU3.5 (CATHETERS) ×2 IMPLANT
DEVICE RAD COMP TR BAND LRG (VASCULAR PRODUCTS) ×2 IMPLANT
GLIDESHEATH SLEND SS 6F .021 (SHEATH) ×2 IMPLANT
GUIDEWIRE INQWIRE 1.5J.035X260 (WIRE) ×1 IMPLANT
GUIDEWIRE PRESSURE COMET II (WIRE) ×2 IMPLANT
INQWIRE 1.5J .035X260CM (WIRE) ×2
KIT ESSENTIALS PG (KITS) ×2 IMPLANT
KIT HEART LEFT (KITS) ×2 IMPLANT
PACK CARDIAC CATHETERIZATION (CUSTOM PROCEDURE TRAY) ×2 IMPLANT
SYR MEDRAD MARK V 150ML (SYRINGE) ×2 IMPLANT
TRANSDUCER W/STOPCOCK (MISCELLANEOUS) ×2 IMPLANT
TUBING CIL FLEX 10 FLL-RA (TUBING) ×2 IMPLANT

## 2018-03-30 NOTE — Brief Op Note (Signed)
BRIEF CARDIAC CATHETERIZATION NOTE  DATE: 03/30/2018 TIME: 5:23 PM  PATIENT:  Kenneth Rios  52 y.o. male  PRE-OPERATIVE DIAGNOSIS:  Angina and HFpEF  POST-OPERATIVE DIAGNOSIS:  Same  PROCEDURE:  Procedure(s): LEFT HEART CATH AND CORONARY ANGIOGRAPHY (N/A) INTRAVASCULAR PRESSURE WIRE/FFR STUDY (N/A)  SURGEON:  Surgeon(s) and Role:    * Angeline Trick, MD - Primary  FINDINGS: 1. 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  FFR of LAD and LCx are not hemodynamically significant by FFR. 2. Probably normal LVEF.  Question apical hypertrophy though LV opacification with hand and power injection was suboptimal. 3. Normal LVEDP.  RECOMMENDATIONS: 1. Medical therapy; will add long-acting nitrate. 2. Escalate statin therapy. 3. Due to shortness of breath and late timing of case, will keep overnight for extended recovery.  Likely d/c home in AM.  [name], MD [facility] Pager: [phone]

## 2018-03-30 NOTE — Progress Notes (Signed)
TR BAND REMOVAL  LOCATION:    right radial  DEFLATED PER PROTOCOL:    Yes.    TIME BAND OFF / DRESSING APPLIED:    22:15   SITE UPON ARRIVAL:    Level 0  SITE AFTER BAND REMOVAL:    Level 0  CIRCULATION SENSATION AND MOVEMENT:    Within Normal Limits   Yes.    COMMENTS:  Post TR band instructions given. Pt tolerated well. 

## 2018-03-30 NOTE — Interval H&P Note (Signed)
History and Physical Interval Note:  03/30/2018 3:56 PM  Kenneth Rios  has presented today for cardiac catheterization, with the diagnosis of angina and cardiomyopathy. The various methods of treatment have been discussed with the patient and family. After consideration of risks, benefits and other options for treatment, the patient has consented to  Procedure(s): LEFT HEART CATH AND CORONARY ANGIOGRAPHY (N/A) as a surgical intervention .  The patient's history has been reviewed, patient examined, no change in status, stable for surgery.  I have reviewed the patient's chart and labs.  Questions were answered to the patient's satisfaction.    Cath Lab Visit (complete for each Cath Lab visit)  Clinical Evaluation Leading to the Procedure:   ACS: No.  Non-ACS:    Anginal Classification: CCS IV  Anti-ischemic medical therapy: Minimal Therapy (1 class of medications)  Non-Invasive Test Results: No non-invasive testing performed.  LVEF mildly reduced with inferior wall motion abnormality by echo - intermediate risk.  Prior CABG: No previous CABG  Kenneth Rios

## 2018-03-30 NOTE — Progress Notes (Signed)
Kenneth StallsJennifer O' Neal, Rn called and informed of CBG and also that pt may need to spend the night due to no one at home to help care for him after procedure.

## 2018-03-30 NOTE — Discharge Instructions (Signed)
Angiogram  An angiogram, also called angiography, is a procedure used to look at the blood vessels. In this procedure, dye is injected through a long, thin tube (catheter) into an artery. X-rays are then taken. The X-rays will show if there is a blockage or problem in a blood vessel.  Tell a health care provider about:   Any allergies you have, including allergies to shellfish or contrast dye.   All medicines you are taking, including vitamins, herbs, eye drops, creams, and over-the-counter medicines.   Any problems you or family members have had with anesthetic medicines.   Any blood disorders you have.   Any surgeries you have had.   Any previous kidney problems or failure you have had.   Any medical conditions you have.   Possibility of pregnancy, if this applies.  What are the risks?  Generally, an angiogram is a safe procedure. However, as with any procedure, problems can occur. Possible problems include:   Injury to the blood vessels, including rupture or bleeding.   Infection or bruising at the catheter site.   Allergic reaction to the dye or contrast used.   Kidney damage from the dye or contrast used.   Blood clots that can lead to a stroke or heart attack.    What happens before the procedure?   Do not eat or drink after midnight on the night before the procedure, or as directed by your health care provider.   Ask your health care provider if you may drink enough water to take any needed medicines the morning of the procedure.  What happens during the procedure?   You may be given a medicine to help you relax (sedative) before and during the procedure. This medicine is given through an IV access tube that is inserted into one of your veins.   The area where the catheter will be inserted will be washed and shaved. This is usually done in the groin but may be done in the fold of your arm (near your elbow) or in the wrist.   A medicine will be given to numb the area where the catheter will  be inserted (local anesthetic).   The catheter will be inserted with a guide wire into an artery. The catheter is guided by using a type of X-ray (fluoroscopy) to the blood vessel being examined.   Dye is then injected into the catheter, and X-rays are taken. The dye helps to show where any narrowing or blockages are located.  What happens after the procedure?   If the procedure is done through the leg, you will be kept in bed lying flat for several hours. You will be instructed to not bend or cross your legs.   The insertion site will be checked frequently.   The pulse in your feet or wrist will be checked frequently.   Additional blood tests, X-rays, and electrocardiography may be done.   You may need to stay in the hospital overnight for observation.  This information is not intended to replace advice given to you by your health care provider. Make sure you discuss any questions you have with your health care provider.  Document Released: 05/05/2005 Document Revised: 01/07/2016 Document Reviewed: 12/27/2012  Elsevier Interactive Patient Education  2017 Elsevier Inc.

## 2018-03-31 ENCOUNTER — Encounter (HOSPITAL_COMMUNITY): Payer: Self-pay | Admitting: Internal Medicine

## 2018-03-31 DIAGNOSIS — I25119 Atherosclerotic heart disease of native coronary artery with unspecified angina pectoris: Secondary | ICD-10-CM | POA: Diagnosis not present

## 2018-03-31 DIAGNOSIS — I5032 Chronic diastolic (congestive) heart failure: Secondary | ICD-10-CM | POA: Diagnosis not present

## 2018-03-31 DIAGNOSIS — I25118 Atherosclerotic heart disease of native coronary artery with other forms of angina pectoris: Secondary | ICD-10-CM

## 2018-03-31 DIAGNOSIS — I110 Hypertensive heart disease with heart failure: Secondary | ICD-10-CM | POA: Diagnosis not present

## 2018-03-31 DIAGNOSIS — I2582 Chronic total occlusion of coronary artery: Secondary | ICD-10-CM | POA: Diagnosis not present

## 2018-03-31 LAB — BASIC METABOLIC PANEL
ANION GAP: 5 (ref 5–15)
BUN: 17 mg/dL (ref 6–20)
CHLORIDE: 105 mmol/L (ref 98–111)
CO2: 25 mmol/L (ref 22–32)
CREATININE: 1.28 mg/dL — AB (ref 0.61–1.24)
Calcium: 8.2 mg/dL — ABNORMAL LOW (ref 8.9–10.3)
GFR calc non Af Amer: >60 mL/min (ref >60)
Glucose, Bld: 296 mg/dL — ABNORMAL HIGH (ref 70–99)
Potassium: 3.9 mmol/L (ref 3.5–5.1)
SODIUM: 135 mmol/L (ref 135–145)

## 2018-03-31 LAB — LIPID PANEL
CHOL/HDL RATIO: 6.1 ratio
CHOLESTEROL: 165 mg/dL (ref 0–200)
HDL: 27 mg/dL — AB (ref >40)
LDL Cholesterol: UNDETERMINED mg/dL (ref 0–99)
Triglycerides: 431 mg/dL — ABNORMAL HIGH (ref <150)
VLDL: UNDETERMINED mg/dL (ref 0–40)

## 2018-03-31 LAB — CBC
HCT: 34 % — ABNORMAL LOW (ref 39.0–52.0)
HEMOGLOBIN: 11.8 g/dL — AB (ref 13.0–17.0)
MCH: 28.6 pg (ref 26.0–34.0)
MCHC: 34.7 g/dL (ref 30.0–36.0)
MCV: 82.5 fL (ref 78.0–100.0)
PLATELETS: 245 10*3/uL (ref 150–400)
RBC: 4.12 MIL/uL — AB (ref 4.22–5.81)
RDW: 12.1 % (ref 11.5–15.5)
WBC: 6.9 10*3/uL (ref 4.0–10.5)

## 2018-03-31 LAB — GLUCOSE, CAPILLARY
GLUCOSE-CAPILLARY: 297 mg/dL — AB (ref 70–99)
GLUCOSE-CAPILLARY: 329 mg/dL — AB (ref 70–99)

## 2018-03-31 MED ORDER — ATORVASTATIN CALCIUM 80 MG PO TABS: 80.0000 mg | ORAL_TABLET | Freq: Every day | ORAL | 3 refills | Status: DC

## 2018-03-31 MED ORDER — ATORVASTATIN CALCIUM 80 MG PO TABS
80.0000 mg | ORAL_TABLET | Freq: Every day | ORAL | Status: DC
  Administered 2018-03-31: 80 mg via ORAL
  Filled 2018-03-31: qty 1

## 2018-03-31 MED ORDER — ISOSORBIDE MONONITRATE ER 30 MG PO TB24: 30.0000 mg | ORAL_TABLET | Freq: Every day | ORAL | 6 refills | Status: DC

## 2018-03-31 MED ORDER — LIVING WELL WITH DIABETES BOOK
Freq: Once | Status: AC
  Administered 2018-03-31: 1
  Filled 2018-03-31: qty 1

## 2018-03-31 MED FILL — ATORVASTATIN 80 MG TABLET: 80 | 30 days supply | Qty: 30 | Fill #0

## 2018-03-31 MED FILL — ISOSORBIDE MN ER 30 MG TAB: 30 | 30 days supply | Qty: 30 | Fill #0

## 2018-03-31 NOTE — Progress Notes (Signed)
Progress Note  Patient Name: Kenneth Rios Date of Encounter: 03/31/2018  Primary Cardiologist: Garwin Brothers, MD  Subjective   Feeling well. No chest pain, sob or palpitations.   Inpatient Medications    Scheduled Meds: . aspirin EC  81 mg Oral Daily  . atorvastatin  20 mg Oral Daily  . carvedilol  6.25 mg Oral BID WC  . enoxaparin (LOVENOX) injection  40 mg Subcutaneous Q24H  . insulin aspart  0-15 Units Subcutaneous TID WC  . insulin aspart  0-5 Units Subcutaneous QHS  . isosorbide mononitrate  30 mg Oral Daily  . lisinopril  10 mg Oral Daily  . polyethylene glycol  17 g Oral Daily  . sodium chloride flush  3 mL Intravenous Q12H   Continuous Infusions: . sodium chloride     PRN Meds: sodium chloride, acetaminophen, albuterol, nitroGLYCERIN, sodium chloride flush   Vital Signs    Vitals:   03/30/18 2000 03/30/18 2014 03/30/18 2200 03/31/18 0540  BP: (!) 145/78 (!) 145/78 138/60 (!) 154/96  Pulse: 81 80 86 82  Resp: (!) 24 (!) 22 (!) 22 (!) 22  Temp:  97.7 F (36.5 C)  97.8 F (36.6 C)  TempSrc:  Oral  Oral  SpO2: 100% 100% 99% 100%  Weight:    97.1 kg  Height:        Intake/Output Summary (Last 24 hours) at 03/31/2018 0743 Last data filed at 03/30/2018 2100 Gross per 24 hour  Intake 223.33 ml  Output -  Net 223.33 ml   Filed Weights   03/30/18 1154 03/31/18 0540  Weight: 97.5 kg 97.1 kg    Telemetry    SR - Personally Reviewed  ECG    NSR at rate of 69 bpm  - Personally Reviewed  Physical Exam   GEN: No acute distress.   Neck: No JVD Cardiac: RRR, no murmurs, rubs, or gallops. Radial cath stable.  Respiratory: Clear to auscultation bilaterally. GI: Soft, nontender, non-distended. Colestomy beg.  MS: No edema; No deformity. Neuro:  Nonfocal  Psych: Normal affect   Labs    Chemistry Recent Labs  Lab 03/29/18 1120 03/31/18 0227  NA 135 135  K 4.6 3.9  CL 96 105  CO2 21 25  GLUCOSE 375* 296*  BUN 15 17  CREATININE 1.25  1.28*  CALCIUM 9.8 8.2*  GFRNONAA 66 >60  GFRAA 76 >60  ANIONGAP  --  5     Hematology Recent Labs  Lab 03/29/18 1120 03/31/18 0227  WBC 5.7 6.9  RBC 5.02 4.12*  HGB 14.9 11.8*  HCT 42.2 34.0*  MCV 84 82.5  MCH 29.7 28.6  MCHC 35.3 34.7  RDW 13.7 12.1  PLT 300 245    Radiology    Dg Chest 2 View  Result Date: 03/29/2018 CLINICAL DATA:  Preop.  CHF and shortness of breath EXAM: CHEST - 2 VIEW COMPARISON:  09/12/2017 FINDINGS: Normal heart size and mediastinal contours. No acute infiltrate or edema. Increased density over the anterior right first rib correlates with costochondral junction spurring on a January 2019 chest CT. No effusion or pneumothorax. No acute osseous findings. IMPRESSION: No evidence of active disease. Electronically Signed   By: Marnee Spring M.D.   On: 03/29/2018 14:14    Cardiac Studies   INTRAVASCULAR PRESSURE WIRE/FFR STUDY  LEFT HEART CATH AND CORONARY ANGIOGRAPHY  Conclusion   Conclusions: 1. 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  Otherwise, mild to moderate 3-vessel CAD. FFR of LAD and  LCx are not hemodynamically significant by FFR. 2. Probably normal LVEF, though opacification of the apical portion of the left ventricle was suboptimal with hand and power injections. 3. Normal LVEDP.  Recommendations: 1. Medical therapy; will add long-acting nitrate. 2. Escalate statin therapy and check lipid panel with morning labs. 3. Due to shortness of breath and late timing of case, will keep overnight for extended recovery. Likely discharge home in AM. 4. Recommend indefinite single antiplatelet therapy with aspirin 81 mg daily due to moderate coronary artery disease.    Diagnostic Diagram        Patient Profile     52 y.o. male with hx of HTN, HLD and HLD presented for outpatient cath.   Echocardiogram 08/2017 with  mildly depressed left ventricular systolic function.    Assessment & Plan    1. CAD - Mild to moderate diease as  noted above. 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  FFR of LAD and LCx are not hemodynamically significant by FFR. - Continue ASA, statin, BB, Imdur and ACE.   2. HLD - 03/31/2018: Cholesterol 165; HDL 27; LDL Cholesterol UNABLE TO CALCULATE IF TRIGLYCERIDE OVER 400 mg/dL; Triglycerides 431; VLDL UNABLE TO CALCULATE IF TRIGLYCERIDE OVER 400 mg/dL  - Will increase lipitor to 80mg  qd. Recheck labs in few weeks.   3. DM - metformin on hold  4. HTN - Minimally elevated. Up- titrate medication as needed as outpatient.   5. Alcohol abuse - says he drinks 6 pack/week. Encouraged cessation.   6. Mild cardiomyopathy - LVEF of 45-50% by echo 08/2017. Probable normal LVEF by cath. Normal LVEDP. euvolemic by exam. Continue BB and ACE.    For questions or updates, please contact [facility] Please consult [email or web address] for contact info under Cardiology/STEMI.      Lorelei PontSigned, Bo Rogue, PA  03/31/2018, 7:43 AM

## 2018-03-31 NOTE — Progress Notes (Signed)
Inpatient Diabetes Program Recommendations  AACE/ADA: New Consensus Statement on Inpatient Glycemic Control (2015)  Target Ranges:  Prepandial:   less than 140 mg/dL      Peak postprandial:   less than 180 mg/dL (1-2 hours)      Critically ill patients:  140 - 180 mg/dL   Spoke with patient about diabetes and home regimen for diabetes control. Patient reports that he has made some changes to his diet and walks the dogs everyday. Per patient he is weaning himself off sweets still eating 2 oatmeal cookies and eating a quart of ice cream a week. Spoke with patient about eating those items sparingly in the future. Patient reports since starting Jardiance his glucose went from 400 to 200's. Spoke with patient about A1c and glucose goals. Told patient his average needs to be more in the 100's in order for him to decrease his cardiac risk factors.  Patient kept telling me he knows how to lower his glucose and will lower it. Spoke with patient to continue to follow with his PCP. Mentioned possibility of insulin in the future. Patient is adamant about not starting insulin.  Patient had a strong personality and did not see an urgency for glucose control and believes it is a process.  Patient verbalized understandingof information discussed and he states that he has no further questions at this time related to diabetes.  Thanks,  Christena DeemShannon Audrianna Driskill RN, MSN, BC-ADM Inpatient Diabetes Coordinator Team Pager [phone] (8a-5p)

## 2018-03-31 NOTE — Progress Notes (Signed)
Inpatient Diabetes Program Recommendations  AACE/ADA: New Consensus Statement on Inpatient Glycemic Control (2015)  Target Ranges:  Prepandial:   less than 140 mg/dL      Peak postprandial:   less than 180 mg/dL (1-2 hours)      Critically ill patients:  140 - 180 mg/dL   Review of Glycemic Control  Diabetes history: DM 2 Outpatient Diabetes medications: Metformin 1000 mg BID (on hold from cath), Jardiance 10 mg Daily Current orders for Inpatient glycemic control: Novolog Moderate Correction 0-15 units tid, Novolog 0-5 units qhs   [facility] and is followed very closely every 3 months. Patient was started on Jardiance in July when A1c increased from 7.8 to 9.8%, Patient reported at that time not following a DM diet and eating a lot of popcicles. Patient follow up 3 months from that visit and MD already has a plan in place to start Victoza if A1c is still elevated.  Will speak with patient today to inquire on his lifestyle changes with exercise and diet from last PCP visit.  Noted glucose levels elevated while here in the 200-300 range while on a controlled diet. RN reports patient possibly going home today. Patient will need to continue with close PCP follow up.  Thanks,  Christena DeemShannon Graciana Sessa RN, MSN, BC-ADM Inpatient Diabetes Coordinator Team Pager [phone] (8a-5p)

## 2018-03-31 NOTE — Discharge Summary (Addendum)
Discharge Summary    Patient ID: Kenneth Rios,  MRN: [id number], DOB/AGE: August 09, 1966 52 y.o.  Admit date: 03/30/2018 Discharge date: 03/31/2018  Primary Care Provider: Hoy RegisterNewlin, Enobong Primary Cardiologist: Garwin Brothersajan R Revankar, MD  Discharge Diagnoses    Active Problems:   (HFpEF) heart failure with preserved ejection fraction (HCC)   CAD   HLD with hypertriglyceridemia   Alcohol abuse   HTN   DM  Allergies No Known Allergies  Diagnostic Studies/Procedures    INTRAVASCULAR PRESSURE WIRE/FFR STUDY  LEFT HEART CATH AND CORONARY ANGIOGRAPHY  Conclusion   Conclusions: 1. 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 Otherwise, mild to moderate 3-vessel CAD. FFR of LAD and LCx are not hemodynamically significant by FFR. 2. Probably normal LVEF, though opacification of the apical portion of the left ventricle was suboptimal with hand and power injections. 3. Normal LVEDP.  Recommendations: 1. Medical therapy; will add long-acting nitrate. 2. Escalate statin therapy and check lipid panel with morning labs. 3. Due to shortness of breath and late timing of case, will keep overnight for extended recovery. Likely discharge home in AM. 4. Recommend indefinite single antiplatelet therapy with aspirin 81 mg daily due to moderate coronary artery disease.    Diagnostic Diagram       History of Present Illness     52 y.o. male with hx of HTN, HLD and HLD presented for outpatient cath.   Echocardiogram 08/2017 with  mildly depressed left ventricular systolic function. Seen by Dr. Consuello Bossierevanker 8/21 with symptoms concerning for angina. Had exertional dyspnea and chest tightness. Recommended cath. Recommended 30 days event monitor for palpitations.   Hospital Course     Consultants: None  1. CAD - Cath showed Mild to moderate diease as noted above. 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 FFR of LAD and LCx are not hemodynamically significant by FFR. - Continue ASA,  statin, BB, Imdur and ACE.   2. HLD - 03/31/2018: Cholesterol 165; HDL 27; LDL Cholesterol UNABLE TO CALCULATE IF TRIGLYCERIDE OVER 400 mg/dL; Triglycerides 431; VLDL UNABLE TO CALCULATE IF TRIGLYCERIDE OVER 400 mg/dL  - Will increase lipitor to 80mg  qd. Recheck labs in few weeks.   3. DM - metformin hold for cath. Seen by Diabetic coordinator. Recommended lifestyle changes with exercise and diet.   4. HTN - Minimally elevated. Up- titrate medication as needed as outpatient.   5. Alcohol abuse - says he drinks 6 pack/week. Encouraged cessation.   6. Mild cardiomyopathy - LVEF of 45-50% by echo 08/2017. Probable normal LVEF by cath. Normal LVEDP. euvolemic by exam. Continue BB and ACE.    Discharge Vitals Blood pressure (!) 154/96, pulse 82, temperature 97.8 F (36.6 C), temperature source Oral, resp. rate (!) 22, height 5\' 9"  (1.753 m), weight 97.1 kg, SpO2 100 %.  Filed Weights   03/30/18 1154 03/31/18 0540  Weight: 97.5 kg 97.1 kg    Labs & Radiologic Studies    CBC Recent Labs    03/29/18 1120 03/31/18 0227  WBC 5.7 6.9  HGB 14.9 11.8*  HCT 42.2 34.0*  MCV 84 82.5  PLT 300 245   Basic Metabolic Panel Recent Labs    40/98/1108/21/19 1120 03/31/18 0227  NA 135 135  K 4.6 3.9  CL 96 105  CO2 21 25  GLUCOSE 375* 296*  BUN 15 17  CREATININE 1.25 1.28*  CALCIUM 9.8 8.2*  MG 2.0  --    Fasting Lipid Panel Recent Labs    03/31/18 0227  CHOL 165  HDL 27*  LDLCALC UNABLE TO CALCULATE IF TRIGLYCERIDE OVER 400 mg/dL  TRIG 161*  CHOLHDL 6.1   Thyroid Function Tests Recent Labs    03/29/18 1120  TSH 1.220   _____________  Dg Chest 2 View  Result Date: 03/29/2018 CLINICAL DATA:  Preop.  CHF and shortness of breath EXAM: CHEST - 2 VIEW COMPARISON:  09/12/2017 FINDINGS: Normal heart size and mediastinal contours. No acute infiltrate or edema. Increased density over the anterior right first rib correlates with costochondral junction spurring on a January 2019  chest CT. No effusion or pneumothorax. No acute osseous findings. IMPRESSION: No evidence of active disease. Electronically Signed   By: Marnee Spring M.D.   On: 03/29/2018 14:14   Disposition   Pt is being discharged home today in good condition.  Follow-up Plans & Appointments    Follow-up Information    [facility] Follow up on 04/03/2018.   Why:  4:10 for hospital follow up Contact information: [address] [place] [address] [phone]       Revankar, Aundra Dubin, MD. Go on 05/12/2018.   Specialty:  Cardiology Why:  @1 :20pm for hospital follow up  Contact information: [address] [place]  [place] [address] [phone]          Discharge Instructions    Diet - low sodium heart healthy   Complete by:  As directed    Discharge instructions   Complete by:  As directed    No driving for 48 hours. No lifting over 5 lbs for 1 week. No sexual activity for 1 week. You may return to work on 04/04/18. Keep procedure site clean & dry. If you notice increased pain, swelling, bleeding or pus, call/return!  You may shower, but no soaking baths/hot tubs/pools for 1 week.  Hold metformin for 2 days. Resume on Sunday.   Increase activity slowly   Complete by:  As directed       Discharge Medications   Allergies as of 03/31/2018   No Known Allergies     Medication List    TAKE these medications   albuterol 108 (90 Base) MCG/ACT inhaler Commonly known as:  PROVENTIL HFA;VENTOLIN HFA Inhale 2 puffs into the lungs every 6 (six) hours as needed for wheezing or shortness of breath.   aspirin EC 81 MG tablet Take 81 mg by mouth daily.   atorvastatin 80 MG tablet Commonly known as:  LIPITOR Take 1 tablet (80 mg total) by mouth daily. What changed:    medication strength  how much to take   carvedilol 6.25 MG tablet Commonly known as:  COREG Take 1 tablet (6.25 mg total) by mouth 2 (two) times daily  with a meal.   empagliflozin 10 MG Tabs tablet Commonly known as:  JARDIANCE Take 10 mg by mouth daily.   furosemide 20 MG tablet Commonly known as:  LASIX Take 1 tablet (20 mg total) by mouth daily.   isosorbide mononitrate 30 MG 24 hr tablet Commonly known as:  IMDUR Take 1 tablet (30 mg total) by mouth daily.   lisinopril 10 MG tablet Commonly known as:  PRINIVIL,ZESTRIL Take 1 tablet (10 mg total) by mouth daily.   metFORMIN 500 MG tablet Commonly known as:  GLUCOPHAGE Take 2 tablets (1,000 mg total) by mouth 2 (two) times daily with a meal.   nitroGLYCERIN 0.4 MG SL tablet Commonly known as:  NITROSTAT Place 1 tablet (0.4 mg  total) under the tongue every 5 (five) minutes as needed for chest pain.   polyethylene glycol packet Commonly known as:  MIRALAX / GLYCOLAX Take 17 g by mouth daily. What changed:    when to take this  reasons to take this        Acute coronary syndrome (MI, NSTEMI, STEMI, etc) this admission?: No.    Outstanding Labs/Studies   Consider OP f/u labs 6-8 weeks given statin up-titration this admission.  Duration of Discharge Encounter   Greater than 30 minutes including physician time.  Signed, Manson Passey, PA 03/31/2018, 10:18 AM

## 2018-03-31 NOTE — Care Management Note (Addendum)
Case Management Note  Patient Details  Name: Kenneth Rios MRN: [id number] Date of Birth: Jan 08, 1966  Subjective/Objective:   From home, s/p clean cath, patient is already established with the [facility], will need to go there to get medication ast at discharge.  Patient informed of this information.                   Action/Plan: DC home when ready.   Expected Discharge Date:  03/31/18               Expected Discharge Plan:  Home/Self Care  In-House Referral:     Discharge planning Services  CM Consult, Indigent Health Clinic, Follow-up appt scheduled, Medication Assistance  Post Acute Care Choice:    Choice offered to:     DME Arranged:    DME Agency:     HH Arranged:    HH Agency:     Status of Service:  Completed, signed off  If discussed at [employer] Meetings, dates discussed:    Additional Comments:  Leone Havenaylor, Lorrin Nawrot Clinton, RN 03/31/2018, 10:31 AM

## 2018-04-03 ENCOUNTER — Inpatient Hospital Stay: Payer: No Typology Code available for payment source | Admitting: Nurse Practitioner

## 2018-04-05 ENCOUNTER — Ambulatory Visit (INDEPENDENT_AMBULATORY_CARE_PROVIDER_SITE_OTHER): Payer: No Typology Code available for payment source

## 2018-04-05 DIAGNOSIS — I209 Angina pectoris, unspecified: Secondary | ICD-10-CM

## 2018-04-05 DIAGNOSIS — R002 Palpitations: Secondary | ICD-10-CM

## 2018-04-05 MED FILL — FUROSEMIDE 20 MG TABLET: 20 | 30 days supply | Qty: 30 | Fill #0

## 2018-04-05 MED FILL — metFORMIN HCL 500 MG TABS: 500 | 30 days supply | Qty: 120 | Fill #1

## 2018-04-07 ENCOUNTER — Encounter (HOSPITAL_COMMUNITY): Payer: Self-pay | Admitting: Internal Medicine

## 2018-04-12 ENCOUNTER — Inpatient Hospital Stay: Payer: No Typology Code available for payment source

## 2018-04-12 NOTE — Progress Notes (Deleted)
Patient ID: Kenneth Rios, male   DOB: 1965/09/14, 52 y.o.   MRN: [id number]   After being hospitalized overnight for observation s/p cardiac cath 8/22-8/23/2019.  From d/c summary:  INTRAVASCULAR PRESSURE WIRE/FFR STUDY  LEFT HEART CATH AND CORONARY ANGIOGRAPHY  Conclusion   Conclusions: 1. 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 Otherwise, mild to moderate 3-vessel CAD. FFR of LAD and LCx are not hemodynamically significant by FFR. 2. Probably normal LVEF, though opacification of the apical portion of the left ventricle was suboptimal with hand and power injections. 3. Normal LVEDP.  Recommendations: 1. Medical therapy; will add long-acting nitrate. 2. Escalate statin therapy and check lipid panel with morning labs. 3. Due to shortness of breath and late timing of case, will keep overnight for extended recovery. Likely discharge home in AM. 4. Recommend indefinite single antiplatelet therapy with aspirin 81 mg daily due to moderate coronary artery disease.    1. 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 FFR of LAD and LCx are not hemodynamically significant by FFR. - Continue ASA, statin, BB, Imdur and ACE.   2. HLD -03/31/2018: Cholesterol 165; HDL 27; LDL Cholesterol UNABLE TO CALCULATE IF TRIGLYCERIDE OVER 400 mg/dL; Triglycerides 431; VLDL UNABLE TO CALCULATE IF TRIGLYCERIDE OVER 400 mg/dL - Will increase lipitor to 80mg  qd. Recheck labs in few weeks.   3. DM - metformin hold for cath. Seen by Diabetic coordinator. Recommended lifestyle changes with exercise and diet.   4. HTN - Minimally elevated. Up- titrate medication as needed as outpatient.   5. Alcohol abuse - says he drinks 6 pack/week. Encouraged cessation.   6. Mild cardiomyopathy - LVEF of 45-50% by echo 08/2017. Probable normal LVEF by cath. Normal LVEDP. euvolemic by exam. Continue BB and ACE.

## 2018-04-14 NOTE — Progress Notes (Signed)
Cathlyn Parsons, CRNA reviewed chart as patient had been hospitalized for chest pain after [facility] office visit. Per JN, ok to proceed with colon 04/18/18 at [facility].

## 2018-04-18 ENCOUNTER — Ambulatory Visit (AMBULATORY_SURGERY_CENTER): Payer: No Typology Code available for payment source | Admitting: Gastroenterology

## 2018-04-18 ENCOUNTER — Encounter: Payer: Self-pay | Admitting: Gastroenterology

## 2018-04-18 VITALS — BP 166/99 | HR 76 | Temp 98.7°F | Resp 30 | Ht 69.0 in | Wt 209.0 lb

## 2018-04-18 DIAGNOSIS — Z1211 Encounter for screening for malignant neoplasm of colon: Secondary | ICD-10-CM

## 2018-04-18 MED ORDER — SODIUM CHLORIDE 0.9 % IV SOLN: 500.0000 mL | Freq: Once | INTRAVENOUS | Status: DC

## 2018-04-18 NOTE — Progress Notes (Signed)
A and O x3. Report to RN. Tolerated MAC anesthesia well.

## 2018-04-18 NOTE — Patient Instructions (Signed)
   INFORMATION ON DIVERTICULOSIS & HEMORRHOIDS & HIGH FIBER DIET  GIVEN TO YOU TODAY  FOLLOW UP WITH DR Sheliah Hatch FOR CONSIDERATION OF COLOSTOMY REVERSAL    YOU HAD AN ENDOSCOPIC PROCEDURE TODAY AT THE  ENDOSCOPY CENTER:   Refer to the procedure report that was given to you for any specific questions about what was found during the examination.  If the procedure report does not answer your questions, please call your gastroenterologist to clarify.  If you requested that your care partner not be given the details of your procedure findings, then the procedure report has been included in a sealed envelope for you to review at your convenience later.  YOU SHOULD EXPECT: Some feelings of bloating in the abdomen. Passage of more gas than usual.  Walking can help get rid of the air that was put into your GI tract during the procedure and reduce the bloating. If you had a lower endoscopy (such as a colonoscopy or flexible sigmoidoscopy) you may notice spotting of blood in your stool or on the toilet paper. If you underwent a bowel prep for your procedure, you may not have a normal bowel movement for a few days.  Please Note:  You might notice some irritation and congestion in your nose or some drainage.  This is from the oxygen used during your procedure.  There is no need for concern and it should clear up in a day or so.  SYMPTOMS TO REPORT IMMEDIATELY:   Following lower endoscopy (colonoscopy or flexible sigmoidoscopy):  Excessive amounts of blood in the stool  Significant tenderness or worsening of abdominal pains  Swelling of the abdomen that is new, acute  Fever of 100F or higher    For urgent or emergent issues, a gastroenterologist can be reached at any hour by calling [phone].   DIET:  We do recommend a small meal at first, but then you may proceed to your regular diet.  Drink plenty of fluids but you should avoid alcoholic beverages for 24 hours.  ACTIVITY:  You should  plan to take it easy for the rest of today and you should NOT DRIVE or use heavy machinery until tomorrow (because of the sedation medicines used during the test).    FOLLOW UP: Our staff will call the number listed on your records the next business day following your procedure to check on you and address any questions or concerns that you may have regarding the information given to you following your procedure. If we do not reach you, we will leave a message.  However, if you are feeling well and you are not experiencing any problems, there is no need to return our call.  We will assume that you have returned to your regular daily activities without incident.  If any biopsies were taken you will be contacted by phone or by letter within the next 1-3 weeks.  Please call us at [phone] if you have not heard about the biopsies in 3 weeks.    SIGNATURES/CONFIDENTIALITY: You and/or your care partner have signed paperwork which will be entered into your electronic medical record.  These signatures attest to the fact that that the information above on your After Visit Summary has been reviewed and is understood.  Full responsibility of the confidentiality of this discharge information lies with you and/or your care-partner.

## 2018-04-18 NOTE — Op Note (Signed)
[facility] Patient Name: Kenneth Rios Procedure Date: 04/18/2018 2:55 PM MRN: [id number] Endoscopist: Meryl Dare , MD Age: 52 Referring MD:  Date of Birth: 15-Mar-1966 Gender: Male Account #: [id number] Procedure:                Colonoscopy Indications:              Screening for colorectal malignant neoplasm Medicines:                Monitored Anesthesia Care Procedure:                Pre-Anesthesia Assessment:                           - Prior to the procedure, a History and Physical                            was performed, and patient medications and                            allergies were reviewed. The patient's tolerance of                            previous anesthesia was also reviewed. The risks                            and benefits of the procedure and the sedation                            options and risks were discussed with the patient.                            All questions were answered, and informed consent                            was obtained. Prior Anticoagulants: The patient has                            taken no previous anticoagulant or antiplatelet                            agents. ASA Grade Assessment: II - A patient with                            mild systemic disease. After reviewing the risks                            and benefits, the patient was deemed in                            satisfactory condition to undergo the procedure.                           After obtaining informed consent, the colonoscope  was passed under direct vision. Throughout the                            procedure, the patient's blood pressure, pulse, and                            oxygen saturations were monitored continuously. The                            Colonoscope was introduced through the descending                            colostomy and advanced to the the cecum, identified                            by  appendiceal orifice and ileocecal valve and                            advanced through the rectum to the sigmoid. The                            colonoscopy was performed without difficulty. The                            patient tolerated the procedure well. The quality                            of the bowel preparation was good. Scope In: 3:02:12 PM Scope Out: 3:16:35 PM Scope Withdrawal Time: 0 hours 8 minutes 19 seconds  Total Procedure Duration: 0 hours 14 minutes 23 seconds  Findings:                 The perianal and digital rectal examinations were                            normal.                           There was evidence of a widely patent end colostomy                            in the descending colon. This was characterized by                            healthy appearing mucosa.                           A few small-mouthed diverticula were found in the                            descending colon. There was no evidence of                            diverticular bleeding.  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 This was was characterized by                            healthy appearing mucosa.                           Internal hemorrhoids were found during                            retroflexion. The hemorrhoids were medium-sized and                            Grade I (internal hemorrhoids that do not prolapse).                           The exam was otherwise normal throughout the                            examined colon. Complications:            No immediate complications. Estimated Blood Loss:     Estimated blood loss: none. Impression:               - Widely patent end colostomy with healthy                            appearing mucosa in the descending colon.                           - Mild diverticulosis in the descending colon.                            There was no evidence of  diverticular bleeding.                           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                           - Internal hemorrhoids.                           - No specimens collected. Recommendation:           - Patient has a contact number available for                            emergencies. The signs and symptoms of potential                            delayed complications were discussed with the  patient. Return to normal activities tomorrow.                            Written discharge instructions were provided to the                            patient.                           - High fiber diet.                           - Continue present medications.                           - Repeat colonoscopy in 10 years for screening                            purposes.                           - Follow up with Dr. Sheliah Hatch for consideration of                            colostomy reversal. Meryl Dare, MD 04/18/2018 3:27:13 PM This report has been signed electronically.

## 2018-04-19 ENCOUNTER — Telehealth: Payer: Self-pay | Admitting: *Deleted

## 2018-04-19 ENCOUNTER — Telehealth: Payer: Self-pay

## 2018-04-19 NOTE — Telephone Encounter (Signed)
  Follow up Call-  Call back number 04/18/2018  Post procedure Call Back phone  # [phone]  Permission to leave phone message No  Some recent data might be hidden     Left message

## 2018-04-19 NOTE — Telephone Encounter (Signed)
  Follow up Call-  Call back number 04/18/2018  Post procedure Call Back phone  # [phone]  Permission to leave phone message No  Some recent data might be hidden     Patient questions:  Do you have a fever, pain , or abdominal swelling? No. Pain Score  0 *  Have you tolerated food without any problems? Yes.    Have you been able to return to your normal activities? Yes.    Do you have any questions about your discharge instructions: Diet   No. Medications  No. Follow up visit  No.  Do you have questions or concerns about your Care? No.  Actions: * If pain score is 4 or above: No action needed, pain <4.

## 2018-05-02 DIAGNOSIS — Z933 Colostomy status: Secondary | ICD-10-CM | POA: Diagnosis not present

## 2018-05-10 ENCOUNTER — Ambulatory Visit: Payer: Self-pay | Admitting: Family Medicine

## 2018-05-12 ENCOUNTER — Ambulatory Visit: Payer: Self-pay | Admitting: Cardiology

## 2018-05-17 DIAGNOSIS — K5780 Diverticulitis of intestine, part unspecified, with perforation and abscess without bleeding: Secondary | ICD-10-CM | POA: Diagnosis not present

## 2018-05-17 DIAGNOSIS — Z433 Encounter for attention to colostomy: Secondary | ICD-10-CM | POA: Diagnosis not present

## 2018-05-18 MED FILL — NEOMYCIN 500 MG TABLET: 500 | 1 days supply | Qty: 6 | Fill #0

## 2018-05-18 MED FILL — metroNIDAZOLE 500 MG TABS: 500 | 1 days supply | Qty: 6 | Fill #0

## 2018-05-19 ENCOUNTER — Ambulatory Visit: Payer: Self-pay | Admitting: General Surgery

## 2018-05-25 ENCOUNTER — Inpatient Hospital Stay (HOSPITAL_COMMUNITY): Admission: RE | Admit: 2018-05-25 | Payer: Medicaid Other | Source: Ambulatory Visit

## 2018-05-25 NOTE — Pre-Procedure Instructions (Signed)
Kenneth Rios  05/25/2018      [facility] - [place], [place] - [place] [address] [place] [address] Phone: [phone] Fax: [phone]    Your procedure is scheduled on May 31, 2018.  Report to [facility] Admitting at 1000 AM.  Call this number if you have problems the morning of surgery:  [phone]   Remember:  Do not eat or drink after midnight.  You may drink clear liquids until 900 AM.  Clear liquids allowed are: Ensure pre-surgery drink,  Water, Juice (non-citric and without pulp), Clear Tea, Black Coffee only and Gatorade    Take these medicines the morning of surgery with A SIP OF WATER  Carvedilol (coreg) Nitrostat-if needed for chest pain Albuterol inhaler-if needed-bring inhaler with you  Follow your surgeon's instructions on when to hold/resume aspirin.  If no instructions were given call the office to determine how they would like to you take aspirin  7 days prior to surgery STOP taking any Aspirin (unless otherwise instructed by your surgeon), Aleve, Naproxen, Ibuprofen, Motrin, Advil, Goody's, BC's, all herbal medications, fish oil, and all vitamins   WHAT DO I DO ABOUT MY DIABETES MEDICATION?   Marland Kitchen Do not take oral diabetes medicines (pills) the morning of surgery-metformin (glucophage).  . The day of surgery, do not take other diabetes injectables, including Byetta (exenatide), Bydureon (exenatide ER), Victoza (liraglutide), or Trulicity (dulaglutide).  Reviewed and Endorsed by [facility] Patient Education Committee, August 2015   How to Manage Your Diabetes Before and After Surgery  Why is it important to control my blood sugar before and after surgery? . Improving blood sugar levels before and after surgery helps healing and can limit problems. . A way of improving blood sugar control is eating a healthy diet by: o  Eating less sugar and carbohydrates o  Increasing activity/exercise o   Talking with your doctor about reaching your blood sugar goals . High blood sugars (greater than 180 mg/dL) can raise your risk of infections and slow your recovery, so you will need to focus on controlling your diabetes during the weeks before surgery. . Make sure that the doctor who takes care of your diabetes knows about your planned surgery including the date and location.  How do I manage my blood sugar before surgery? . Check your blood sugar at least 4 times a day, starting 2 days before surgery, to make sure that the level is not too high or low. o Check your blood sugar the morning of your surgery when you wake up and every 2 hours until you get to the Short Stay unit. . If your blood sugar is less than 70 mg/dL, you will need to treat for low blood sugar: o Do not take insulin. o Treat a low blood sugar (less than 70 mg/dL) with  cup of clear juice (cranberry or apple), 4 glucose tablets, OR glucose gel. Recheck blood sugar in 15 minutes after treatment (to make sure it is greater than 70 mg/dL). If your blood sugar is not greater than 70 mg/dL on recheck, call [phone] o  for further instructions. . Report your blood sugar to the short stay nurse when you get to Short Stay.  . If you are admitted to the hospital after surgery: o Your blood sugar will be checked by the staff and you will probably be given insulin after surgery (instead of oral diabetes medicines) to make sure you have  good blood sugar levels. o The goal for blood sugar control after surgery is 80-180 mg/dL.   [place]- Preparing For Surgery  Before surgery, you can play an important role. Because skin is not sterile, your skin needs to be as free of germs as possible. You can reduce the number of germs on your skin by washing with CHG (chlorahexidine gluconate) Soap before surgery.  CHG is an antiseptic cleaner which kills germs and bonds with the skin to continue killing germs even after washing.    Oral  Hygiene is also important to reduce your risk of infection.  Remember - BRUSH YOUR TEETH THE MORNING OF SURGERY WITH YOUR REGULAR TOOTHPASTE  Please do not use if you have an allergy to CHG or antibacterial soaps. If your skin becomes reddened/irritated stop using the CHG.  Do not shave (including legs and underarms) for at least 48 hours prior to first CHG shower. It is OK to shave your face.  Please follow these instructions carefully.   1. Shower the NIGHT BEFORE SURGERY and the MORNING OF SURGERY with CHG.   2. If you chose to wash your hair, wash your hair first as usual with your normal shampoo.  3. After you shampoo, rinse your hair and body thoroughly to remove the shampoo.  4. Use CHG as you would any other liquid soap. You can apply CHG directly to the skin and wash gently with a scrungie or a clean washcloth.   5. Apply the CHG Soap to your body ONLY FROM THE NECK DOWN.  Do not use on open wounds or open sores. Avoid contact with your eyes, ears, mouth and genitals (private parts). Wash Face and genitals (private parts)  with your normal soap.  6. Wash thoroughly, paying special attention to the area where your surgery will be performed.  7. Thoroughly rinse your body with warm water from the neck down.  8. DO NOT shower/wash with your normal soap after using and rinsing off the CHG Soap.  9. Pat yourself dry with a CLEAN TOWEL.  10. Wear CLEAN PAJAMAS to bed the night before surgery, wear comfortable clothes the morning of surgery  11. Place CLEAN SHEETS on your bed the night of your first shower and DO NOT SLEEP WITH PETS.  Day of Surgery:  Do not apply any deodorants/lotions.  Please wear clean clothes to the hospital/surgery center.   Remember to brush your teeth WITH YOUR REGULAR TOOTHPASTE.   Do not wear jewelry  Do not wear lotions, powders, or colognes, or deodorant.  Men may shave face and neck.  Do not bring valuables to the hospital.  [facility] is not  responsible for any belongings or valuables.  Contacts, dentures or bridgework may not be worn into surgery.  Leave your suitcase in the car.  After surgery it may be brought to your room.  For patients admitted to the hospital, discharge time will be determined by your treatment team.  Patients discharged the day of surgery will not be allowed to drive home.   Please read over the following fact sheets that you were given. Pain Booklet, Coughing and Deep Breathing and Surgical Site Infection Prevention

## 2018-05-29 ENCOUNTER — Other Ambulatory Visit: Payer: Self-pay

## 2018-05-29 ENCOUNTER — Encounter (HOSPITAL_COMMUNITY): Payer: Self-pay

## 2018-05-29 ENCOUNTER — Encounter (HOSPITAL_COMMUNITY)
Admission: RE | Admit: 2018-05-29 | Discharge: 2018-05-29 | Disposition: A | Discharge status: Home / Self Care | Payer: Medicaid Other | Source: Ambulatory Visit | Attending: General Surgery | Admitting: General Surgery

## 2018-05-29 DIAGNOSIS — Z01812 Encounter for preprocedural laboratory examination: Secondary | ICD-10-CM | POA: Insufficient documentation

## 2018-05-29 DIAGNOSIS — K5792 Diverticulitis of intestine, part unspecified, without perforation or abscess without bleeding: Secondary | ICD-10-CM | POA: Diagnosis not present

## 2018-05-29 DIAGNOSIS — F419 Anxiety disorder, unspecified: Secondary | ICD-10-CM | POA: Insufficient documentation

## 2018-05-29 DIAGNOSIS — F1010 Alcohol abuse, uncomplicated: Secondary | ICD-10-CM | POA: Insufficient documentation

## 2018-05-29 DIAGNOSIS — I429 Cardiomyopathy, unspecified: Secondary | ICD-10-CM | POA: Insufficient documentation

## 2018-05-29 DIAGNOSIS — I2510 Atherosclerotic heart disease of native coronary artery without angina pectoris: Secondary | ICD-10-CM | POA: Diagnosis not present

## 2018-05-29 DIAGNOSIS — I10 Essential (primary) hypertension: Secondary | ICD-10-CM | POA: Insufficient documentation

## 2018-05-29 DIAGNOSIS — Z79899 Other long term (current) drug therapy: Secondary | ICD-10-CM | POA: Insufficient documentation

## 2018-05-29 DIAGNOSIS — Z87891 Personal history of nicotine dependence: Secondary | ICD-10-CM | POA: Diagnosis not present

## 2018-05-29 DIAGNOSIS — Z7984 Long term (current) use of oral hypoglycemic drugs: Secondary | ICD-10-CM | POA: Diagnosis not present

## 2018-05-29 DIAGNOSIS — E119 Type 2 diabetes mellitus without complications: Secondary | ICD-10-CM | POA: Insufficient documentation

## 2018-05-29 DIAGNOSIS — E785 Hyperlipidemia, unspecified: Secondary | ICD-10-CM | POA: Diagnosis not present

## 2018-05-29 LAB — CBC WITH DIFFERENTIAL/PLATELET
ABS IMMATURE GRANULOCYTES: 0.03 10*3/uL (ref 0.00–0.07)
BASOS ABS: 0.1 10*3/uL (ref 0.0–0.1)
BASOS PCT: 1 %
EOS ABS: 0.4 10*3/uL (ref 0.0–0.5)
Eosinophils Relative: 6 %
HCT: 43.8 % (ref 39.0–52.0)
Hemoglobin: 14.9 g/dL (ref 13.0–17.0)
Immature Granulocytes: 1 %
Lymphocytes Relative: 49 %
Lymphs Abs: 3.3 10*3/uL (ref 0.7–4.0)
MCH: 27.6 pg (ref 26.0–34.0)
MCHC: 34 g/dL (ref 30.0–36.0)
MCV: 81.1 fL (ref 80.0–100.0)
Monocytes Absolute: 0.4 10*3/uL (ref 0.1–1.0)
Monocytes Relative: 6 %
NEUTROS ABS: 2.4 10*3/uL (ref 1.7–7.7)
NEUTROS PCT: 37 %
NRBC: 0 % (ref 0.0–0.2)
PLATELETS: 329 10*3/uL (ref 150–400)
RBC: 5.4 MIL/uL (ref 4.22–5.81)
RDW: 11.9 % (ref 11.5–15.5)
WBC: 6.6 10*3/uL (ref 4.0–10.5)

## 2018-05-29 LAB — BASIC METABOLIC PANEL
ANION GAP: 14 (ref 5–15)
BUN: 11 mg/dL (ref 6–20)
CALCIUM: 9.3 mg/dL (ref 8.9–10.3)
CO2: 21 mmol/L — AB (ref 22–32)
Chloride: 104 mmol/L (ref 98–111)
Creatinine, Ser: 0.98 mg/dL (ref 0.61–1.24)
Glucose, Bld: 267 mg/dL — ABNORMAL HIGH (ref 70–99)
POTASSIUM: 3.5 mmol/L (ref 3.5–5.1)
Sodium: 139 mmol/L (ref 135–145)

## 2018-05-29 LAB — GLUCOSE, CAPILLARY: GLUCOSE-CAPILLARY: 258 mg/dL — AB (ref 70–99)

## 2018-05-29 NOTE — Progress Notes (Addendum)
Arrival time to PAT, his blood pressure was "high", he states he did not take his coreg this am.  Will recheck prior to him leaving PAT.  Repeat bp  172/116, 192/106, 184/105.  I did speak with Fayrene Fearing PA concerning this issue. PCP is Dr. Hoy Register  LOV 02/2018 Cardio is [facility] LOV 03/2018 Echo 08/2017 Cath 03/2018 He states he has "fluttering" all the time (EKG done 03/2018 shows NSR), and that "you all don't know what my heart is doing, I can feel it" Doesn't check his sugars on daily basis.  Unaware of lows and highs. He states that he doesn't take Coreg/ Carvedilol. When he was instructed about the removal of jewelry (ear piercings), "they don't come out" When questioned about his bowel prep, he appears clueless. He does have lady friend with him, whom appears to know all the info concerning prep, antibiotics, medications. I did call and speak with Toniann Fail at [facility] about patient issues.  I did give her the phone # to Luther Bradley  S/O  [phone] - she will call and touch base His s/o understands all the potential issues that may arise the dos if instructions and plans are not carried out.

## 2018-05-29 NOTE — Pre-Procedure Instructions (Signed)
Kenneth Rios  05/29/2018      [facility] - [place], [place] - [place] [address] [place] [address] Phone: [phone] Fax: [phone]    Your procedure is scheduled on Wednesday, May 31, 2018.   Report to [facility] Admitting at 1000 AM.             (posted surgery time 12noon- 3p)   Call this number if you have problems the morning of surgery:  [phone]   Remember:  Do not eat any foods after midnight Tuesday.   You may drink clear liquids until 900 AM.  Clear liquids allowed are: Ensure pre-surgery drink,  Water, Juice (non-citric and without pulp), Clear Tea, Black Coffee only and Gatorade    Take these medicines the morning of surgery with A SIP OF WATER  Carvedilol (coreg) Nitrostat-if needed for chest pain Albuterol inhaler-if needed-bring inhaler with you  Follow your surgeon's instructions on when to hold/resume aspirin.  If no instructions were given call the office to determine how they would like to you take aspirin  7 days prior to surgery STOP taking any Aspirin (unless otherwise instructed by your surgeon), Aleve, Naproxen, Ibuprofen, Motrin, Advil, Goody's, BC's, all herbal medications, fish oil, and all vitamins   WHAT DO I DO ABOUT MY DIABETES MEDICATION?   Marland Kitchen Do not take oral diabetes medicines (pills) the morning of surgery-metformin (glucophage).  . The day of surgery, do not take other diabetes injectables, including Byetta (exenatide), Bydureon (exenatide ER), Victoza (liraglutide), or Trulicity (dulaglutide).  Reviewed and Endorsed by [facility] Patient Education Committee, August 2015   How to Manage Your Diabetes Before and After Surgery  Why is it important to control my blood sugar before and after surgery? . Improving blood sugar levels before and after surgery helps healing and can limit problems. . A way of improving blood sugar control is eating a healthy diet by: o   Eating less sugar and carbohydrates o  Increasing activity/exercise o  Talking with your doctor about reaching your blood sugar goals . High blood sugars (greater than 180 mg/dL) can raise your risk of infections and slow your recovery, so you will need to focus on controlling your diabetes during the weeks before surgery. . Make sure that the doctor who takes care of your diabetes knows about your planned surgery including the date and location.  How do I manage my blood sugar before surgery? . Check your blood sugar at least 4 times a day, starting 2 days before surgery, to make sure that the level is not too high or low. o Check your blood sugar the morning of your surgery when you wake up and every 2 hours until you get to the Short Stay unit. . If your blood sugar is less than 70 mg/dL, you will need to treat for low blood sugar: o Do not take insulin. o Treat a low blood sugar (less than 70 mg/dL) with  cup of clear juice (cranberry or apple), 4 glucose tablets, OR glucose gel. Recheck blood sugar in 15 minutes after treatment (to make sure it is greater than 70 mg/dL). If your blood sugar is not greater than 70 mg/dL on recheck, call [phone] o  for further instructions. . Report your blood sugar to the short stay nurse when you get to Short Stay.  . If you are admitted to the hospital after surgery: o Your blood sugar will be checked  by the staff and you will probably be given insulin after surgery (instead of oral diabetes medicines) to make sure you have good blood sugar levels. o The goal for blood sugar control after surgery is 80-180 mg/dL.   [place]- Preparing For Surgery  Before surgery, you can play an important role. Because skin is not sterile, your skin needs to be as free of germs as possible. You can reduce the number of germs on your skin by washing with CHG (chlorahexidine gluconate) Soap before surgery.  CHG is an antiseptic cleaner which kills germs and bonds  with the skin to continue killing germs even after washing.    Oral Hygiene is also important to reduce your risk of infection.  Remember - BRUSH YOUR TEETH THE MORNING OF SURGERY WITH YOUR REGULAR TOOTHPASTE  Please do not use if you have an allergy to CHG or antibacterial soaps. If your skin becomes reddened/irritated stop using the CHG.  Do not shave (including legs and underarms) for at least 48 hours prior to first CHG shower. It is OK to shave your face.  Please follow these instructions carefully.   1. Shower the NIGHT BEFORE SURGERY and the MORNING OF SURGERY with CHG.   2. If you chose to wash your hair, wash your hair first as usual with your normal shampoo.  3. After you shampoo, rinse your hair and body thoroughly to remove the shampoo.  4. Use CHG as you would any other liquid soap. You can apply CHG directly to the skin and wash gently with a scrungie or a clean washcloth.   5. Apply the CHG Soap to your body ONLY FROM THE NECK DOWN.  Do not use on open wounds or open sores. Avoid contact with your eyes, ears, mouth and genitals (private parts). Wash Face and genitals (private parts)  with your normal soap.  6. Wash thoroughly, paying special attention to the area where your surgery will be performed.  7. Thoroughly rinse your body with warm water from the neck down.  8. DO NOT shower/wash with your normal soap after using and rinsing off the CHG Soap.  9. Pat yourself dry with a CLEAN TOWEL.  10. Wear CLEAN PAJAMAS to bed the night before surgery, wear comfortable clothes the morning of surgery  11. Place CLEAN SHEETS on your bed the night of your first shower and DO NOT SLEEP WITH PETS.  Day of Surgery:  Do not apply any deodorants/lotions.  Please wear clean clothes to the hospital/surgery center.   Remember to brush your teeth WITH YOUR REGULAR TOOTHPASTE.   Do not wear jewelry  Do not wear lotions, powders, or colognes, or deodorant.  Men may shave face and  neck.  Do not bring valuables to the hospital.  [facility] is not responsible for any belongings or valuables.  Contacts, dentures or bridgework may not be worn into surgery.  Leave your suitcase in the car.  After surgery it may be brought to your room.  For patients admitted to the hospital, discharge time will be determined by your treatment team.  Patients discharged the day of surgery will not be allowed to drive home.   Please read over the following fact sheets that you were given. Pain Booklet, Coughing and Deep Breathing and Surgical Site Infection Prevention

## 2018-05-29 NOTE — Pre-Procedure Instructions (Signed)
Kenneth Rios  05/29/2018      [facility] - [place], [place] - [place] [address] [place] [address] Phone: [phone] Fax: [phone]    Your procedure is scheduled on Wednesday, May 31, 2018.   Report to [facility] Admitting at 1000 AM.             (posted surgery time 12noon- 3p)   Call this number if you have problems the morning of surgery:  [phone]   Remember:  Do not eat any foods after midnight Tuesday.   You may drink clear liquids until 900 AM.  Clear liquids allowed are: Ensure pre-surgery drink,  Water, Juice (non-citric and without pulp), Clear Tea, Black Coffee only and Gatorade    Take these medicines the morning of surgery with A SIP OF WATER  Carvedilol (coreg) Nitrostat-if needed for chest pain Albuterol inhaler-if needed-bring inhaler with you  Follow your surgeon's instructions on when to hold/resume aspirin.  If no instructions were given call the office to determine how they would like to you take aspirin  7 days prior to surgery STOP taking any Aspirin (unless otherwise instructed by your surgeon), Aleve, Naproxen, Ibuprofen, Motrin, Advil, Goody's, BC's, all herbal medications, fish oil, and all vitamins  Day of Surgery:  Do not apply any deodorants/lotions.  Please wear clean clothes to the hospital/surgery center.   Remember to brush your teeth WITH YOUR REGULAR TOOTHPASTE.   Do not wear jewelry - NO RINGS  Do not wear lotions,  colognes, or deodorant.  Men may shave face and neck.   Do not bring valuables to the hospital.  [facility] is not responsible for any belongings or valuables.  Contacts, dentures or bridgework may not be worn into surgery.  Leave your suitcase in the car.  After surgery it may be brought to your room.  For patients admitted to the hospital, discharge time will be determined by your treatment team.  Patients discharged the day of surgery will not  be allowed to drive home.   Please read over the following fact sheets that you were given. Pain Booklet, Coughing and Deep Breathing and Surgical Site Infection Prevention    [place]- Preparing For Surgery  Before surgery, you can play an important role. Because skin is not sterile, your skin needs to be as free of germs as possible. You can reduce the number of germs on your skin by washing with CHG (chlorahexidine gluconate) Soap before surgery.  CHG is an antiseptic cleaner which kills germs and bonds with the skin to continue killing germs even after washing.    Oral Hygiene is also important to reduce your risk of infection.    Remember - BRUSH YOUR TEETH THE MORNING OF SURGERY WITH YOUR REGULAR TOOTHPASTE  Please do not use if you have an allergy to CHG or antibacterial soaps. If your skin becomes reddened/irritated stop using the CHG.  Do not shave (including legs and underarms) for at least 48 hours prior to first CHG shower. It is OK to shave your face.  Please follow these instructions carefully.   1. Shower the NIGHT BEFORE SURGERY and the MORNING OF SURGERY with CHG.   2. If you chose to wash your hair, wash your hair first as usual with your normal shampoo.  3. After you shampoo, rinse your hair and body thoroughly to remove the shampoo.  4. Use CHG as you would any other liquid  soap. You can apply CHG directly to the skin and wash gently with a scrungie or a clean washcloth.   5. Apply the CHG Soap to your body ONLY FROM THE NECK DOWN.  Do not use on open wounds or open sores. Avoid contact with your eyes, ears, mouth and genitals (private parts). Wash Face and genitals (private parts)  with your normal soap.  6. Wash thoroughly, paying special attention to the area where your surgery will be performed.  7. Thoroughly rinse your body with warm water from the neck down.  8. DO NOT shower/wash with your normal soap after using and rinsing off the CHG Soap.  9. Pat  yourself dry with a CLEAN TOWEL.  10. Wear CLEAN PAJAMAS to bed the night before surgery, wear comfortable clothes the morning of surgery  11. Place CLEAN SHEETS on your bed the night of your first shower and DO NOT SLEEP WITH PETS.         How to Manage Your Diabetes Before and After Surgery  Why is it important to control my blood sugar before and after surgery? . Improving blood sugar levels before and after surgery helps healing and can limit problems. . A way of improving blood sugar control is eating a healthy diet by: o  Eating less sugar and carbohydrates o  Increasing activity/exercise o  Talking with your doctor about reaching your blood sugar goals . High blood sugars (greater than 180 mg/dL) can raise your risk of infections and slow your recovery, so you will need to focus on controlling your diabetes during the weeks before surgery. . Make sure that the doctor who takes care of your diabetes knows about your planned surgery including the date and location.  How do I manage my blood sugar before surgery? . Check your blood sugar at least 4 times a day, starting 2 days before surgery, to make sure that the level is not too high or low. Marland Kitchen  o Check your blood sugar the morning of your surgery when you wake up and every 2 hours until you get to the Short Stay unit. . If your blood sugar is less than 70 mg/dL, you will need to treat for low blood sugar: .  o Do not take insulin. o  o Treat a low blood sugar (less than 70 mg/dL) with  cup of clear juice (cranberry or apple), 4 glucose tablets, OR glucose gel.  NO ORANGE JUICE. o  Recheck blood sugar in 15 minutes after treatment (to make sure it is greater than 70 mg/dL). If your blood sugar is not greater than 70 mg/dL on recheck, call [phone] o  for further instructions. . Report your blood sugar to the short stay nurse when you get to Short Stay.  . If you are admitted to the hospital after surgery: o Your blood  sugar will be checked by the staff and you will probably be given insulin after surgery (instead of oral diabetes medicines) to make sure you have good blood sugar levels. o The goal for blood sugar control after surgery is 80-180 mg/dL.   WHAT DO I DO ABOUT MY DIABETES MEDICATION?   Marland Kitchen Do not take oral diabetes medicines (pills) the morning of surgery.  Other Instructions:          Patient Signature:  Date:   Nurse Signature:  Date:   Reviewed and Endorsed by [facility] Patient Education Committee, August 2015

## 2018-05-29 NOTE — Progress Notes (Signed)
   05/29/18 0827  OBSTRUCTIVE SLEEP APNEA  Have you ever been diagnosed with sleep apnea through a sleep study? No  Do you snore loudly (loud enough to be heard through closed doors)?  1  Do you often feel tired, fatigued, or sleepy during the daytime (such as falling asleep during driving or talking to someone)? 1  Has anyone observed you stop breathing during your sleep? 1  Do you have, or are you being treated for high blood pressure? 1  BMI more than 35 kg/m2? 0  Age > 50 (1-yes) 1  Male Gender (Yes=1) 1  Obstructive Sleep Apnea Score 6  Score 5 or greater  Results sent to PCP

## 2018-05-30 LAB — HEMOGLOBIN A1C
Hgb A1c MFr Bld: 10.6 % — ABNORMAL HIGH (ref 4.8–5.6)
Mean Plasma Glucose: 258 mg/dL

## 2018-05-30 MED ORDER — BUPIVACAINE LIPOSOME 1.3 % IJ SUSP
20.0000 mL | Freq: Once | INTRAMUSCULAR | Status: AC
  Administered 2018-05-31: 20 mL
  Filled 2018-05-30: qty 20

## 2018-05-30 NOTE — Progress Notes (Signed)
Anesthesia Chart Review:  Case:  [id number] Date/Time:  05/31/18 1145   Procedure:  LAPAROSCOPIC COLOSTOMY REVERSAL COLERCTAL ANASTOMOSIS ERAS PATHWAY (N/A )   Anesthesia type:  General   Pre-op diagnosis:  diverticulitis   [facility] ROOM 08 / [facility]   Surgeon:  [name], De Blanch, MD      DISCUSSION: 52 yo male former smoker. Pertinent hx includes CAD (Mild to moderate diease by cath 03/2018. 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 FFR of LAD and LCx are not hemodynamically significant by FFR.), DMII, HTN, ETOH abuse, Mild cardiomyopathy (LVEF of 45-50% by echo 08/2017. Probable normal LVEF by cath 03/2018. Normal LVEDP.)  Pt recently had cath for eval of cardiomyopathy and angina. Cath showed chronic total occlusion of the apical LAD. Otherwise, mild to moderate 3-vessel CAD. FFR of LAD and LCx are not hemodynamically significant by FFR. Probably normal LVEF, though opacification of the apical portion of the left ventricle was suboptimal with hand and power injections. Normal LVEDP. Medical therapy recommended.  Pet PAT nurse, pt does display medical noncompliance. He does not monitor his blood sugar and admits to not taking his antiHTN meds on the day of his PAT appt. BP elevated 182/100. Blood glucose elevated at 258 with A1c 10.6. Pt made aware that if his BG markedly uncontrolled on DOS that his case may have to be postponed. Toniann Fail at [facility] was made aware and said she will reach out to pt.  Anticipate he can proceed as planned if BG acceptable on DOS.  VS: BP (!) 182/100   Pulse 70   Temp 36.6 C   Resp 20   Ht 5\' 9"  (1.753 m)   Wt 97.1 kg   SpO2 100%   BMI 31.60 kg/m   PROVIDERS: Hoy Register, MD is PCP  Belva Crome, MD is Cardiologist  LABS: Poor BG control.  (all labs ordered are listed, but only abnormal results are displayed)  Labs Reviewed  GLUCOSE, CAPILLARY - Abnormal; Notable for the following components:      Result Value   Glucose-Capillary 258 (*)     All other components within normal limits  BASIC METABOLIC PANEL - Abnormal; Notable for the following components:   CO2 21 (*)    Glucose, Bld 267 (*)    All other components within normal limits  HEMOGLOBIN A1C - Abnormal; Notable for the following components:   Hgb A1c MFr Bld 10.6 (*)    All other components within normal limits  CBC WITH DIFFERENTIAL/PLATELET     IMAGES: CHEST - 2 VIEW 03/29/2018  COMPARISON:  09/12/2017  FINDINGS: Normal heart size and mediastinal contours. No acute infiltrate or edema. Increased density over the anterior right first rib correlates with costochondral junction spurring on a January 2019 chest CT. No effusion or pneumothorax. No acute osseous findings.  IMPRESSION: No evidence of active disease.   EKG: 03/31/2018: Normal sinus rhythm. Possible Left atrial enlargement. Cannot rule out Septal infarct New since previous tracing  CV: Cardiac event monitor 04/05/2018: EVENT MONITOR REPORT:   Patient was monitored from 04/05/2018 to 05/04/2018. Indication:                    Palpitations Ordering physician:  Garwin Brothers, MD  Referring physician:        Garwin Brothers, MD    Baseline rhythm: Sinus rhythm with baseline heart rate of 71  Minimum heart rate: 56 BPM. Maximal heart rate 140 BPM Atrial arrhythmia: None  Ventricular arrhythmia: None  Conduction abnormality: None  Symptoms: None   Conclusion:  Normal event monitoring.  Interpreting  cardiologist: Garwin Brothers, MD  Date: 05/11/2018 12:06 PM  Cath 03/30/2018: 1. 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  Otherwise, mild to moderate 3-vessel CAD. FFR of LAD and LCx are not hemodynamically significant by FFR. 2. Probably normal LVEF, though opacification of the apical portion of the left ventricle was suboptimal with hand and power injections. 3. Normal LVEDP.  Recommendations: 1. Medical therapy; will add long-acting nitrate. 2. Escalate  statin therapy and check lipid panel with morning labs. 3. Due to shortness of breath and late timing of case, will keep overnight for extended recovery. Likely discharge home in AM. 4. Recommend indefinite single antiplatelet therapy with aspirin 81 mg daily due to moderate coronary artery disease.  TTE 08/27/2017: Study Conclusions  - Left ventricle: Inferior / Inferior basal hypokinesis The cavity   size was mildly dilated. Wall thickness was increased in a   pattern of mild LVH. Systolic function was mildly reduced. The   estimated ejection fraction was in the range of 45% to 50%.   Doppler parameters are consistent with elevated ventricular   end-diastolic filling pressure. - Mitral valve: Restricted posterior leaflet motion. There was mild   regurgitation. - Left atrium: The atrium was moderately dilated. - Atrial septum: No defect or patent foramen ovale was identified.   Past Medical History:  Diagnosis Date  . Anxiety   . CHF (congestive heart failure) (HCC)    "fluttering"  . Depression   . Diabetes mellitus without complication (HCC)   . Diverticulitis   . GERD (gastroesophageal reflux disease)   . Hyperlipidemia   . Hypertension     Past Surgical History:  Procedure Laterality Date  . CARDIAC CATHETERIZATION  03/30/2018  . COLON RESECTION N/A 09/05/2017   Procedure: HARTMAN'S COLECTOMY AND COLOSTOMY;  Surgeon: Sheliah Hatch De Blanch, MD;  Location: [facility] OR;  Service: General;  Laterality: N/A;  . COLON SURGERY    . INTRAVASCULAR PRESSURE WIRE/FFR STUDY N/A 03/30/2018   Procedure: INTRAVASCULAR PRESSURE WIRE/FFR STUDY;  Surgeon: Yvonne Kendall, MD;  Location: [facility] INVASIVE CV LAB;  Service: Cardiovascular;  Laterality: N/A;  . LEFT HEART CATH AND CORONARY ANGIOGRAPHY N/A 03/30/2018   Procedure: LEFT HEART CATH AND CORONARY ANGIOGRAPHY;  Surgeon: Yvonne Kendall, MD;  Location: [facility] INVASIVE CV LAB;  Service: Cardiovascular;  Laterality: N/A;    MEDICATIONS: .  albuterol (PROVENTIL HFA;VENTOLIN HFA) 108 (90 Base) MCG/ACT inhaler  . aspirin EC 81 MG tablet  . atorvastatin (LIPITOR) 80 MG tablet  . carvedilol (COREG) 6.25 MG tablet  . empagliflozin (JARDIANCE) 10 MG TABS tablet  . furosemide (LASIX) 20 MG tablet  . isosorbide mononitrate (IMDUR) 30 MG 24 hr tablet  . lisinopril (PRINIVIL,ZESTRIL) 10 MG tablet  . metFORMIN (GLUCOPHAGE) 500 MG tablet  . metroNIDAZOLE (FLAGYL) 500 MG tablet  . neomycin (MYCIFRADIN) 500 MG tablet  . nitroGLYCERIN (NITROSTAT) 0.4 MG SL tablet  . polyethylene glycol (MIRALAX / GLYCOLAX) packet   No current facility-administered medications for this encounter.    Melene Muller ON 05/31/2018] bupivacaine liposome (EXPAREL) 1.3 % injection 266 mg    Zannie Cove [facility] Short Stay Center/Anesthesiology Phone [phone] 05/30/2018 9:17 AM

## 2018-05-31 ENCOUNTER — Encounter (HOSPITAL_COMMUNITY)
Admission: RE | Disposition: A | Discharge status: Home Health | Payer: Self-pay | Source: Home / Self Care | Attending: General Surgery

## 2018-05-31 ENCOUNTER — Encounter (HOSPITAL_COMMUNITY): Payer: Self-pay

## 2018-05-31 ENCOUNTER — Other Ambulatory Visit: Payer: Self-pay

## 2018-05-31 ENCOUNTER — Inpatient Hospital Stay (HOSPITAL_COMMUNITY): Payer: Medicaid Other | Admitting: Anesthesiology

## 2018-05-31 ENCOUNTER — Inpatient Hospital Stay (HOSPITAL_COMMUNITY)
Admission: RE | Admit: 2018-05-31 | Discharge: 2018-06-07 | DRG: 330 | Disposition: A | Discharge status: Home Health | Payer: Medicaid Other | Attending: General Surgery | Admitting: General Surgery

## 2018-05-31 ENCOUNTER — Inpatient Hospital Stay (HOSPITAL_COMMUNITY): Payer: Medicaid Other | Admitting: Physician Assistant

## 2018-05-31 DIAGNOSIS — Z8249 Family history of ischemic heart disease and other diseases of the circulatory system: Secondary | ICD-10-CM | POA: Diagnosis not present

## 2018-05-31 DIAGNOSIS — Z6832 Body mass index (BMI) 32.0-32.9, adult: Secondary | ICD-10-CM | POA: Diagnosis not present

## 2018-05-31 DIAGNOSIS — E785 Hyperlipidemia, unspecified: Secondary | ICD-10-CM | POA: Diagnosis not present

## 2018-05-31 DIAGNOSIS — E1165 Type 2 diabetes mellitus with hyperglycemia: Secondary | ICD-10-CM | POA: Diagnosis present

## 2018-05-31 DIAGNOSIS — D62 Acute posthemorrhagic anemia: Secondary | ICD-10-CM | POA: Diagnosis not present

## 2018-05-31 DIAGNOSIS — K660 Peritoneal adhesions (postprocedural) (postinfection): Secondary | ICD-10-CM | POA: Diagnosis not present

## 2018-05-31 DIAGNOSIS — K219 Gastro-esophageal reflux disease without esophagitis: Secondary | ICD-10-CM | POA: Diagnosis not present

## 2018-05-31 DIAGNOSIS — I110 Hypertensive heart disease with heart failure: Secondary | ICD-10-CM | POA: Diagnosis present

## 2018-05-31 DIAGNOSIS — K5732 Diverticulitis of large intestine without perforation or abscess without bleeding: Secondary | ICD-10-CM

## 2018-05-31 DIAGNOSIS — E119 Type 2 diabetes mellitus without complications: Secondary | ICD-10-CM | POA: Diagnosis not present

## 2018-05-31 DIAGNOSIS — I5022 Chronic systolic (congestive) heart failure: Secondary | ICD-10-CM | POA: Diagnosis present

## 2018-05-31 DIAGNOSIS — K5720 Diverticulitis of large intestine with perforation and abscess without bleeding: Secondary | ICD-10-CM | POA: Diagnosis not present

## 2018-05-31 DIAGNOSIS — R51 Headache: Secondary | ICD-10-CM | POA: Diagnosis not present

## 2018-05-31 DIAGNOSIS — Z23 Encounter for immunization: Secondary | ICD-10-CM

## 2018-05-31 DIAGNOSIS — Z433 Encounter for attention to colostomy: Principal | ICD-10-CM

## 2018-05-31 DIAGNOSIS — I10 Essential (primary) hypertension: Secondary | ICD-10-CM | POA: Diagnosis present

## 2018-05-31 DIAGNOSIS — Z833 Family history of diabetes mellitus: Secondary | ICD-10-CM

## 2018-05-31 DIAGNOSIS — K435 Parastomal hernia without obstruction or  gangrene: Secondary | ICD-10-CM | POA: Diagnosis not present

## 2018-05-31 DIAGNOSIS — E669 Obesity, unspecified: Secondary | ICD-10-CM | POA: Diagnosis not present

## 2018-05-31 DIAGNOSIS — I509 Heart failure, unspecified: Secondary | ICD-10-CM | POA: Diagnosis not present

## 2018-05-31 DIAGNOSIS — Z87891 Personal history of nicotine dependence: Secondary | ICD-10-CM

## 2018-05-31 HISTORY — PX: COLOSTOMY TAKEDOWN: SHX5258

## 2018-05-31 HISTORY — DX: Diverticulitis of large intestine without perforation or abscess without bleeding: K57.32

## 2018-05-31 LAB — GLUCOSE, CAPILLARY
Glucose-Capillary: 237 mg/dL — ABNORMAL HIGH (ref 70–99)
Glucose-Capillary: 215 mg/dL — ABNORMAL HIGH (ref 70–99)
Glucose-Capillary: 223 mg/dL — ABNORMAL HIGH (ref 70–99)

## 2018-05-31 SURGERY — CLOSURE, COLOSTOMY, LAPAROSCOPIC
Anesthesia: General | Site: Abdomen

## 2018-05-31 MED ORDER — LISINOPRIL 10 MG PO TABS
10.0000 mg | ORAL_TABLET | Freq: Once | ORAL | Status: AC
  Administered 2018-05-31: 10 mg via ORAL
  Filled 2018-05-31: qty 1

## 2018-05-31 MED ORDER — MIDAZOLAM HCL 5 MG/5ML IJ SOLN
INTRAMUSCULAR | Status: DC | PRN
  Administered 2018-05-31: 2 mg via INTRAVENOUS

## 2018-05-31 MED ORDER — MORPHINE SULFATE (PF) 2 MG/ML IV SOLN
2.0000 mg | INTRAVENOUS | Status: DC | PRN
  Administered 2018-05-31 – 2018-06-02 (×20): 2 mg via INTRAVENOUS
  Filled 2018-05-31 (×20): qty 1

## 2018-05-31 MED ORDER — FENTANYL CITRATE (PF) 100 MCG/2ML IJ SOLN
INTRAMUSCULAR | Status: AC
  Administered 2018-05-31: 50 ug via INTRAVENOUS
  Filled 2018-05-31: qty 2

## 2018-05-31 MED ORDER — PROPOFOL 10 MG/ML IV BOLUS
INTRAVENOUS | Status: AC
  Filled 2018-05-31: qty 20

## 2018-05-31 MED ORDER — LABETALOL HCL 5 MG/ML IV SOLN
5.0000 mg | Freq: Once | INTRAVENOUS | Status: AC
  Administered 2018-05-31: 5 mg via INTRAVENOUS

## 2018-05-31 MED ORDER — SCOPOLAMINE 1 MG/3DAYS TD PT72
1.0000 | MEDICATED_PATCH | TRANSDERMAL | Status: DC
  Administered 2018-05-31: 1.5 mg via TRANSDERMAL
  Filled 2018-05-31: qty 1

## 2018-05-31 MED ORDER — ISOSORBIDE MONONITRATE ER 30 MG PO TB24
30.0000 mg | ORAL_TABLET | Freq: Every day | ORAL | Status: DC
  Administered 2018-05-31 – 2018-06-06 (×7): 30 mg via ORAL
  Filled 2018-05-31 (×7): qty 1

## 2018-05-31 MED ORDER — PHENYLEPHRINE 40 MCG/ML (10ML) SYRINGE FOR IV PUSH (FOR BLOOD PRESSURE SUPPORT)
PREFILLED_SYRINGE | INTRAVENOUS | Status: AC
  Filled 2018-05-31: qty 10

## 2018-05-31 MED ORDER — HYDROCODONE-ACETAMINOPHEN 5-325 MG PO TABS
ORAL_TABLET | ORAL | Status: AC
  Filled 2018-05-31: qty 2

## 2018-05-31 MED ORDER — DEXAMETHASONE SODIUM PHOSPHATE 10 MG/ML IJ SOLN
INTRAMUSCULAR | Status: DC | PRN
  Administered 2018-05-31: 10 mg via INTRAVENOUS

## 2018-05-31 MED ORDER — ACETAMINOPHEN 500 MG PO TABS
1000.0000 mg | ORAL_TABLET | ORAL | Status: AC
  Administered 2018-05-31: 1000 mg via ORAL

## 2018-05-31 MED ORDER — FENTANYL CITRATE (PF) 250 MCG/5ML IJ SOLN
INTRAMUSCULAR | Status: AC
  Filled 2018-05-31: qty 5

## 2018-05-31 MED ORDER — GLYCOPYRROLATE PF 0.2 MG/ML IJ SOSY
PREFILLED_SYRINGE | INTRAMUSCULAR | Status: AC
  Filled 2018-05-31: qty 1

## 2018-05-31 MED ORDER — ONDANSETRON HCL 4 MG/2ML IJ SOLN
INTRAMUSCULAR | Status: DC | PRN
  Administered 2018-05-31: 4 mg via INTRAVENOUS

## 2018-05-31 MED ORDER — HYDROCODONE-ACETAMINOPHEN 5-325 MG PO TABS
1.0000 | ORAL_TABLET | ORAL | Status: DC | PRN
  Administered 2018-05-31 – 2018-06-02 (×6): 2 via ORAL
  Filled 2018-05-31 (×7): qty 2

## 2018-05-31 MED ORDER — ONDANSETRON HCL 4 MG/2ML IJ SOLN
4.0000 mg | Freq: Four times a day (QID) | INTRAMUSCULAR | Status: DC | PRN
  Administered 2018-06-03: 4 mg via INTRAVENOUS
  Filled 2018-05-31: qty 2

## 2018-05-31 MED ORDER — BUPIVACAINE-EPINEPHRINE (PF) 0.25% -1:200000 IJ SOLN
INTRAMUSCULAR | Status: AC
  Filled 2018-05-31: qty 30

## 2018-05-31 MED ORDER — LIDOCAINE 2% (20 MG/ML) 5 ML SYRINGE
INTRAMUSCULAR | Status: DC | PRN
  Administered 2018-05-31: 100 mg via INTRAVENOUS

## 2018-05-31 MED ORDER — CEFOTETAN DISODIUM-DEXTROSE 2-2.08 GM-%(50ML) IV SOLR
2.0000 g | INTRAVENOUS | Status: AC
  Administered 2018-05-31: 2 g via INTRAVENOUS

## 2018-05-31 MED ORDER — LABETALOL HCL 5 MG/ML IV SOLN
INTRAVENOUS | Status: AC
  Filled 2018-05-31: qty 4

## 2018-05-31 MED ORDER — HYDRALAZINE HCL 20 MG/ML IJ SOLN
10.0000 mg | INTRAMUSCULAR | Status: DC | PRN
  Administered 2018-06-03 – 2018-06-05 (×2): 10 mg via INTRAVENOUS
  Filled 2018-05-31 (×4): qty 1

## 2018-05-31 MED ORDER — ACETAMINOPHEN 500 MG PO TABS
ORAL_TABLET | ORAL | Status: AC
  Administered 2018-05-31: 1000 mg via ORAL
  Filled 2018-05-31: qty 2

## 2018-05-31 MED ORDER — HYDROMORPHONE HCL 1 MG/ML IJ SOLN
0.5000 mg | INTRAMUSCULAR | Status: DC | PRN
  Administered 2018-05-31 (×2): 0.5 mg via INTRAVENOUS

## 2018-05-31 MED ORDER — CARVEDILOL 3.125 MG PO TABS
6.2500 mg | ORAL_TABLET | Freq: Once | ORAL | Status: AC
  Administered 2018-05-31: 6.25 mg via ORAL

## 2018-05-31 MED ORDER — 0.9 % SODIUM CHLORIDE (POUR BTL) OPTIME
TOPICAL | Status: DC | PRN
  Administered 2018-05-31 (×2): 1000 mL

## 2018-05-31 MED ORDER — CEFOTETAN DISODIUM-DEXTROSE 2-2.08 GM-%(50ML) IV SOLR
INTRAVENOUS | Status: AC
  Filled 2018-05-31: qty 50

## 2018-05-31 MED ORDER — DEXTROSE-NACL 5-0.45 % IV SOLN
INTRAVENOUS | Status: DC
  Administered 2018-05-31 – 2018-06-04 (×7): via INTRAVENOUS

## 2018-05-31 MED ORDER — STERILE WATER FOR IRRIGATION IR SOLN
Status: DC | PRN
  Administered 2018-05-31: 1000 mL

## 2018-05-31 MED ORDER — LIDOCAINE 2% (20 MG/ML) 5 ML SYRINGE
INTRAMUSCULAR | Status: AC
  Filled 2018-05-31: qty 5

## 2018-05-31 MED ORDER — BUPIVACAINE-EPINEPHRINE 0.25% -1:200000 IJ SOLN
INTRAMUSCULAR | Status: DC | PRN
  Administered 2018-05-31: 30 mL

## 2018-05-31 MED ORDER — DEXAMETHASONE SODIUM PHOSPHATE 10 MG/ML IJ SOLN
INTRAMUSCULAR | Status: AC
  Filled 2018-05-31: qty 1

## 2018-05-31 MED ORDER — CARVEDILOL 3.125 MG PO TABS
ORAL_TABLET | ORAL | Status: AC
  Administered 2018-05-31: 6.25 mg via ORAL
  Filled 2018-05-31: qty 2

## 2018-05-31 MED ORDER — CARVEDILOL 6.25 MG PO TABS
6.2500 mg | ORAL_TABLET | Freq: Once | ORAL | Status: AC
  Administered 2018-05-31: 6.25 mg via ORAL
  Filled 2018-05-31: qty 1

## 2018-05-31 MED ORDER — CARVEDILOL 6.25 MG PO TABS
6.2500 mg | ORAL_TABLET | Freq: Two times a day (BID) | ORAL | Status: DC
  Administered 2018-05-31 – 2018-06-07 (×14): 6.25 mg via ORAL
  Filled 2018-05-31 (×14): qty 1

## 2018-05-31 MED ORDER — NITROGLYCERIN 0.4 MG SL SUBL: 0.4000 mg | SUBLINGUAL_TABLET | SUBLINGUAL | Status: DC | PRN

## 2018-05-31 MED ORDER — HEPARIN SODIUM (PORCINE) 5000 UNIT/ML IJ SOLN
INTRAMUSCULAR | Status: AC
  Administered 2018-05-31: 5000 [IU] via SUBCUTANEOUS
  Filled 2018-05-31: qty 1

## 2018-05-31 MED ORDER — SODIUM CHLORIDE 0.9 % IV SOLN
INTRAVENOUS | Status: DC | PRN
  Administered 2018-05-31: 20 ug/min via INTRAVENOUS

## 2018-05-31 MED ORDER — ENOXAPARIN SODIUM 40 MG/0.4ML ~~LOC~~ SOLN
40.0000 mg | SUBCUTANEOUS | Status: DC
  Administered 2018-06-01 – 2018-06-07 (×7): 40 mg via SUBCUTANEOUS
  Filled 2018-05-31 (×7): qty 0.4

## 2018-05-31 MED ORDER — HEPARIN SODIUM (PORCINE) 5000 UNIT/ML IJ SOLN
5000.0000 [IU] | Freq: Once | INTRAMUSCULAR | Status: AC
  Administered 2018-05-31: 5000 [IU] via SUBCUTANEOUS

## 2018-05-31 MED ORDER — CHLORHEXIDINE GLUCONATE 4 % EX LIQD: 60.0000 mL | Freq: Once | CUTANEOUS | Status: DC

## 2018-05-31 MED ORDER — FENTANYL CITRATE (PF) 100 MCG/2ML IJ SOLN
25.0000 ug | INTRAMUSCULAR | Status: DC | PRN
  Administered 2018-05-31 (×3): 50 ug via INTRAVENOUS

## 2018-05-31 MED ORDER — KETOROLAC TROMETHAMINE 30 MG/ML IJ SOLN: 30.0000 mg | Freq: Four times a day (QID) | INTRAMUSCULAR | Status: DC

## 2018-05-31 MED ORDER — MIDAZOLAM HCL 2 MG/2ML IJ SOLN
INTRAMUSCULAR | Status: AC
  Filled 2018-05-31: qty 2

## 2018-05-31 MED ORDER — BACITRACIN ZINC 500 UNIT/GM EX OINT
TOPICAL_OINTMENT | CUTANEOUS | Status: AC
  Filled 2018-05-31: qty 28.35

## 2018-05-31 MED ORDER — ALVIMOPAN 12 MG PO CAPS
12.0000 mg | ORAL_CAPSULE | ORAL | Status: AC
  Administered 2018-05-31: 12 mg via ORAL

## 2018-05-31 MED ORDER — LIDOCAINE-EPINEPHRINE 1 %-1:100000 IJ SOLN
INTRAMUSCULAR | Status: AC
  Filled 2018-05-31: qty 1

## 2018-05-31 MED ORDER — ONDANSETRON 4 MG PO TBDP: 4.0000 mg | ORAL_TABLET | Freq: Four times a day (QID) | ORAL | Status: DC | PRN

## 2018-05-31 MED ORDER — KETOROLAC TROMETHAMINE 30 MG/ML IJ SOLN: 30.0000 mg | Freq: Four times a day (QID) | INTRAMUSCULAR | Status: DC | PRN

## 2018-05-31 MED ORDER — SUGAMMADEX SODIUM 200 MG/2ML IV SOLN
INTRAVENOUS | Status: DC | PRN
  Administered 2018-05-31: 400 mg via INTRAVENOUS

## 2018-05-31 MED ORDER — LABETALOL HCL 5 MG/ML IV SOLN
10.0000 mg | Freq: Once | INTRAVENOUS | Status: AC
  Administered 2018-05-31: 10 mg via INTRAVENOUS
  Filled 2018-05-31: qty 4

## 2018-05-31 MED ORDER — ALBUTEROL SULFATE (2.5 MG/3ML) 0.083% IN NEBU: 2.5000 mg | INHALATION_SOLUTION | Freq: Four times a day (QID) | RESPIRATORY_TRACT | Status: DC | PRN

## 2018-05-31 MED ORDER — LIDOCAINE HCL 4 % MT SOLN
OROMUCOSAL | Status: DC | PRN
  Administered 2018-05-31: 4 mL via TOPICAL

## 2018-05-31 MED ORDER — ALVIMOPAN 12 MG PO CAPS
ORAL_CAPSULE | ORAL | Status: AC
  Administered 2018-05-31: 12 mg via ORAL
  Filled 2018-05-31: qty 1

## 2018-05-31 MED ORDER — ALBUMIN HUMAN 5 % IV SOLN
INTRAVENOUS | Status: DC | PRN
  Administered 2018-05-31 (×2): via INTRAVENOUS

## 2018-05-31 MED ORDER — FENTANYL CITRATE (PF) 250 MCG/5ML IJ SOLN
INTRAMUSCULAR | Status: DC | PRN
  Administered 2018-05-31 (×5): 50 ug via INTRAVENOUS

## 2018-05-31 MED ORDER — BACITRACIN ZINC 500 UNIT/GM EX OINT
TOPICAL_OINTMENT | Freq: Every day | CUTANEOUS | Status: DC
  Administered 2018-05-31: 20:00:00 via TOPICAL

## 2018-05-31 MED ORDER — LACTATED RINGERS IV SOLN
INTRAVENOUS | Status: DC | PRN
  Administered 2018-05-31 (×2): via INTRAVENOUS

## 2018-05-31 MED ORDER — ROCURONIUM BROMIDE 50 MG/5ML IV SOSY
PREFILLED_SYRINGE | INTRAVENOUS | Status: AC
  Filled 2018-05-31: qty 5

## 2018-05-31 MED ORDER — ACETAMINOPHEN 10 MG/ML IV SOLN
1000.0000 mg | Freq: Four times a day (QID) | INTRAVENOUS | Status: AC
  Administered 2018-05-31 – 2018-06-01 (×3): 1000 mg via INTRAVENOUS
  Filled 2018-05-31 (×2): qty 100

## 2018-05-31 MED ORDER — CARVEDILOL 3.125 MG PO TABS: 3.1250 mg | ORAL_TABLET | Freq: Once | ORAL | Status: DC

## 2018-05-31 MED ORDER — ONDANSETRON HCL 4 MG/2ML IJ SOLN
INTRAMUSCULAR | Status: AC
  Filled 2018-05-31: qty 2

## 2018-05-31 MED ORDER — GABAPENTIN 300 MG PO CAPS
300.0000 mg | ORAL_CAPSULE | Freq: Two times a day (BID) | ORAL | Status: DC
  Administered 2018-05-31 – 2018-06-07 (×14): 300 mg via ORAL
  Filled 2018-05-31 (×14): qty 1

## 2018-05-31 MED ORDER — HYDROMORPHONE HCL 1 MG/ML IJ SOLN
INTRAMUSCULAR | Status: AC
  Administered 2018-05-31: 0.5 mg via INTRAVENOUS
  Filled 2018-05-31: qty 2

## 2018-05-31 MED ORDER — ACETAMINOPHEN 10 MG/ML IV SOLN
INTRAVENOUS | Status: AC
  Filled 2018-05-31: qty 100

## 2018-05-31 MED ORDER — ROCURONIUM BROMIDE 10 MG/ML (PF) SYRINGE
PREFILLED_SYRINGE | INTRAVENOUS | Status: DC | PRN
  Administered 2018-05-31: 10 mg via INTRAVENOUS
  Administered 2018-05-31: 20 mg via INTRAVENOUS
  Administered 2018-05-31: 10 mg via INTRAVENOUS
  Administered 2018-05-31: 100 mg via INTRAVENOUS

## 2018-05-31 MED ORDER — PROPOFOL 10 MG/ML IV BOLUS
INTRAVENOUS | Status: DC | PRN
  Administered 2018-05-31: 200 mg via INTRAVENOUS

## 2018-05-31 MED ORDER — LABETALOL HCL 5 MG/ML IV SOLN
INTRAVENOUS | Status: DC | PRN
  Administered 2018-05-31 (×2): 5 mg via INTRAVENOUS

## 2018-05-31 SURGICAL SUPPLY — 58 items
BANDAGE ADH SHEER 1  50/CT (GAUZE/BANDAGES/DRESSINGS) ×9 IMPLANT
BENZOIN TINCTURE PRP APPL 2/3 (GAUZE/BANDAGES/DRESSINGS) ×3 IMPLANT
BNDG GAUZE ELAST 4 BULKY (GAUZE/BANDAGES/DRESSINGS) ×3 IMPLANT
CANISTER SUCT 3000ML PPV (MISCELLANEOUS) ×3 IMPLANT
CLOSURE WOUND 1/2 X4 (GAUZE/BANDAGES/DRESSINGS) ×1
COVER SURGICAL LIGHT HANDLE (MISCELLANEOUS) ×6 IMPLANT
COVER WAND RF STERILE (DRAPES) ×3 IMPLANT
ELECT CAUTERY BLADE 6.4 (BLADE) ×6 IMPLANT
ELECT REM PT RETURN 9FT ADLT (ELECTROSURGICAL) ×3
ELECTRODE REM PT RTRN 9FT ADLT (ELECTROSURGICAL) ×1 IMPLANT
GAUZE SPONGE 2X2 8PLY STRL LF (GAUZE/BANDAGES/DRESSINGS) ×1 IMPLANT
GEL ULTRASOUND 20GR AQUASONIC (MISCELLANEOUS) ×6 IMPLANT
GLOVE BIO SURGEON STRL SZ7 (GLOVE) ×3 IMPLANT
GLOVE BIO SURGEON STRL SZ7.5 (GLOVE) ×6 IMPLANT
GLOVE BIOGEL PI IND STRL 6.5 (GLOVE) ×3 IMPLANT
GLOVE BIOGEL PI IND STRL 7.0 (GLOVE) ×6 IMPLANT
GLOVE BIOGEL PI IND STRL 7.5 (GLOVE) ×2 IMPLANT
GLOVE BIOGEL PI INDICATOR 6.5 (GLOVE) ×6
GLOVE BIOGEL PI INDICATOR 7.0 (GLOVE) ×12
GLOVE BIOGEL PI INDICATOR 7.5 (GLOVE) ×4
GLOVE SURG SS PI 6.0 STRL IVOR (GLOVE) ×9 IMPLANT
GLOVE SURG SS PI 6.5 STRL IVOR (GLOVE) ×6 IMPLANT
GLOVE SURG SS PI 7.0 STRL IVOR (GLOVE) ×15 IMPLANT
GOWN STRL REUS W/ TWL LRG LVL3 (GOWN DISPOSABLE) ×11 IMPLANT
GOWN STRL REUS W/TWL LRG LVL3 (GOWN DISPOSABLE) ×22
KIT TURNOVER KIT B (KITS) ×3 IMPLANT
NEEDLE 22X1 1/2 (OR ONLY) (NEEDLE) IMPLANT
NS IRRIG 1000ML POUR BTL (IV SOLUTION) ×6 IMPLANT
PACK COLON (CUSTOM PROCEDURE TRAY) ×3 IMPLANT
PAD ARMBOARD 7.5X6 YLW CONV (MISCELLANEOUS) ×6 IMPLANT
PENCIL BUTTON HOLSTER BLD 10FT (ELECTRODE) ×3 IMPLANT
PORT LAP GEL ALEXIS MED 5-9CM (MISCELLANEOUS) ×3 IMPLANT
SCISSORS LAP 5X35 DISP (ENDOMECHANICALS) ×3 IMPLANT
SET IRRIG TUBING LAPAROSCOPIC (IRRIGATION / IRRIGATOR) ×6 IMPLANT
SHEARS HARMONIC ACE PLUS 36CM (ENDOMECHANICALS) ×3 IMPLANT
SLEEVE ENDOPATH XCEL 5M (ENDOMECHANICALS) ×6 IMPLANT
SPECIMEN JAR LARGE (MISCELLANEOUS) ×3 IMPLANT
SPONGE GAUZE 2X2 STER 10/PKG (GAUZE/BANDAGES/DRESSINGS) ×2
STAPLER CIRC ILS CVD 33MM 37CM (STAPLE) ×3 IMPLANT
STAPLER VISISTAT 35W (STAPLE) ×3 IMPLANT
STRIP CLOSURE SKIN 1/2X4 (GAUZE/BANDAGES/DRESSINGS) ×2 IMPLANT
SUT MNCRL AB 4-0 PS2 18 (SUTURE) ×6 IMPLANT
SUT PDS AB 0 CT 36 (SUTURE) ×6 IMPLANT
SUT PROLENE 2 0 KS (SUTURE) ×3 IMPLANT
SUT SILK 2 0 SH CR/8 (SUTURE) ×3 IMPLANT
SUT SILK 2 0 TIES 10X30 (SUTURE) ×3 IMPLANT
SUT SILK 3 0 SH CR/8 (SUTURE) ×3 IMPLANT
SUT SILK 3 0 TIES 10X30 (SUTURE) ×3 IMPLANT
SUT VIC AB 3-0 SH 27 (SUTURE) ×4
SUT VIC AB 3-0 SH 27X BRD (SUTURE) ×2 IMPLANT
TAPE CLOTH SURG 4X10 WHT LF (GAUZE/BANDAGES/DRESSINGS) ×3 IMPLANT
TRAY FOLEY MTR SLVR 16FR STAT (SET/KITS/TRAYS/PACK) ×3 IMPLANT
TRAY PROCTOSCOPIC FIBER OPTIC (SET/KITS/TRAYS/PACK) ×3 IMPLANT
TROCAR XCEL NON-BLD 5MMX100MML (ENDOMECHANICALS) ×3 IMPLANT
TUBE CONNECTING 12'X1/4 (SUCTIONS) ×2
TUBE CONNECTING 12X1/4 (SUCTIONS) ×4 IMPLANT
TUBING INSUF HEATED (TUBING) ×3 IMPLANT
WATER STERILE IRR 1000ML POUR (IV SOLUTION) ×3 IMPLANT

## 2018-05-31 NOTE — Op Note (Signed)
Preoperative diagnosis: colostomy in place  Postoperative diagnosis: same   Procedure: laparoscopic lysis of adhesions, laparoscopic take down of end colostomy with colorectal anastomosis  Surgeon: Feliciana Rossetti, M.D.  Asst: Magnus Ivan RNFA  Anesthesia: general  Indications for procedure: Kenneth Rios is a 52 y.o. year old male with symptoms of diverticulitis treated with hartman's colectomy. He has resolved and presents for reversal of colostomy.  Description of procedure: The patient was brought into the operative suite. Anesthesia was administered with General endotracheal anesthesia. WHO checklist was applied. The patient was then placed in lithotomy position. The area was prepped and draped in the usual sterile fashion.  Next, a small transverse incision was made under the left costal margin. A 5 mm trocar was used to gain access to the peritoneum and optical entry fashion.  Name is applied with a high flow and low pressure.  Flap was reinserted to confirm position.  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  There were some adhesions of the omentum up to the lower abdominal wall in the midline scar.  2 additional 5 mm trochars are placed one in the right upper quadrant and one in the right lower quadrant.  Dissection of the midline scar and began using Harmonic scalpel the omentum was dissected away from the abdominal wall moving from the umbilicus inferiorly.  Next, attention was turned towards the adhesions up to the surrounding colostomy area.  Blunt dissection and harmonic scalpel were used to dissect the omentum out of the area.  Patient was then placed in steep Trendelenburg position and the intestine was brought out of the pelvis.  This allowed visualization of the rectal stump.  Blunt dissection was used to free the rectal stump from the surrounding pelvic wall.  There is  no kinking or twisting of the rectal stump.  Next, attention was turned towards the left abdominal wall for multiple adhesions of the small intestine to the left lower quadrant.  These were freed using sharp dissection as well as blunt dissection.  This allowed complete mobilization of the small intestine.  Next, the white line of Toldt was examined and there was still some adhesions in the left upper quadrant of the colon to the abdominal wall.  These were taken down with sharp dissection as well as Harmonic scalpel.  Prior to decision for mobilization of the spleen, I decided to take down the colostomy for better assessment of length.  Therefore ellipitical incision was made around the colostomy blunt dissection and cautery was used to dissect down to the subcutaneous issues.  The colostomy was freed from surrounding tissue and 360. Next a pursestring device was used to place a 2-0 Prolene pursestring around the clean area of colon.  Approximately 2 cm beneath the colostomy edge.  Colostomy edge of colon was then removed with cautery. Colon in this area was dilated incision was made to perform proceed with a 33 mm anvil and stapler. Multiple 30 silks were used to suture the pursestring to colon.  Anvil was then placed the pursestring was tied down. Colon was then put back into the abdomen.  A wound protector was put in the place of colostomy incision and the yellow cap was placed on top.  Insufflation was then reestablished.  The reach of the colon was very close for additional adhesiolysis was performed in the left upper quadrant to ensure that  the colon to reach all the way to the rectum without tension.  This did not require complete mobilization of the splenic flexure.  Next, the patient was replaced and sutured in Trendelenburg to allow the small intestine to be retracted out of the pelvis.  At this time I went down to the hernia dilating up the rectum, removing old stool particles, and placed the 33 mm  EEA stapler.  The anvil and stapler were attached in standard fashion.  Stapler was tightened down completely.  Anastomosis was created. Stapler was removed.  A rigid proctoscope was performed.  Anastomosis appeared airtight . No bubbles were seen on leak test. Next, I irrigated the abdomen.  There are no signs of bleeding.  Majority of the irrigant was removed with suction.  Next all dirty instruments were removed.  New draping was performed. The fascial defect of the colostomy site was closed with a running 0 PDS. External Marcaine mix was injected into the rectus muscle in that area as well as subcutaneous tissue surrounding each of the incisions. All skin incisions of laparoscopic areas were closed with 4-0 Monocryl subjective fashion.  The skin of the colostomy site was left partially open and a pursestring using 3-0 Vicryl was performed to close the size of the defect.  Patient then awoke from anesthesia throat pack in stable condition.  All counts are correct.  Findings: airtight anastomosis  Specimen: none  Implant: none   Blood loss:  Local anesthesia: 50 ml Exparel:Marcaine Mix  Complications: none  Feliciana Rossetti, M.D. General, Bariatric, & Minimally Invasive Surgery [facility]

## 2018-05-31 NOTE — Transfer of Care (Signed)
Immediate Anesthesia Transfer of Care Note  Patient: Kenneth Rios  Procedure(s) Performed: LAPAROSCOPIC COLOSTOMY REVERSAL COLORECTAL ANASTOMOSIS ERAS PATHWAY (N/A Abdomen)  Patient Location: PACU  Anesthesia Type:General  Level of Consciousness: awake, alert  and oriented  Airway & Oxygen Therapy: Patient Spontanous Breathing  Post-op Assessment: Report given to RN and Post -op Vital signs reviewed and stable  Post vital signs: Reviewed and stable  Last Vitals:  Vitals Value Taken Time  BP 179/106 05/31/2018  4:05 PM  Temp    Pulse 88 05/31/2018  4:06 PM  Resp 13 05/31/2018  4:06 PM  SpO2 100 % 05/31/2018  4:06 PM  Vitals shown include unvalidated device data.  Last Pain:  Vitals:   05/31/18 0952  TempSrc:   PainSc: 0-No pain         Complications: No apparent anesthesia complications

## 2018-05-31 NOTE — Anesthesia Procedure Notes (Signed)
Procedure Name: Intubation Date/Time: 05/31/2018 12:18 PM Performed by: Marsa Aris, CRNA Pre-anesthesia Checklist: Patient identified, Emergency Drugs available, Suction available and Patient being monitored Patient Re-evaluated:Patient Re-evaluated prior to induction Oxygen Delivery Method: Circle System Utilized Preoxygenation: Pre-oxygenation with 100% oxygen Induction Type: IV induction Ventilation: Mask ventilation without difficulty Laryngoscope Size: Miller and 2 Tube type: Oral Number of attempts: 1 Airway Equipment and Method: Stylet,  Oral airway and LTA kit utilized Placement Confirmation: ETT inserted through vocal cords under direct vision,  positive ETCO2 and breath sounds checked- equal and bilateral Secured at: 22 cm Tube secured with: Tape Dental Injury: Teeth and Oropharynx as per pre-operative assessment  Comments: No change in dentition from pre-procedure

## 2018-05-31 NOTE — Anesthesia Preprocedure Evaluation (Addendum)
Anesthesia Evaluation  Patient identified by MRN, date of birth, ID band Patient awake    Reviewed: Allergy & Precautions, NPO status , Patient's Chart, lab work & pertinent test results, reviewed documented beta blocker date and time   History of Anesthesia Complications Negative for: history of anesthetic complications  Airway Mallampati: II  TM Distance: >3 FB Neck ROM: Full    Dental  (+) Dental Advisory Given, Chipped,    Pulmonary asthma , former smoker,    Pulmonary exam normal        Cardiovascular hypertension, Pt. on home beta blockers + angina + CAD  Normal cardiovascular exam  Conclusions: 1. 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  Otherwise, mild to moderate 3-vessel CAD. FFR of LAD and LCx are not hemodynamically significant by FFR. 2. Probably normal LVEF, though opacification of the apical portion of the left ventricle was suboptimal with hand and power injections. 3. Normal LVEDP.  Recommendations: 1. Medical therapy; will add long-acting nitrate. 2. Escalate statin therapy and check lipid panel with morning labs. 3. Due to shortness of breath and late timing of case, will keep overnight for extended recovery. Likely discharge home in AM. 4. Recommend indefinite single antiplatelet therapy with aspirin 81 mg daily due to moderate coronary artery disease.  Study Conclusions  - Left ventricle: Inferior / Inferior basal hypokinesis The cavity   size was mildly dilated. Wall thickness was increased in a  pattern of mild LVH. Systolic function was mildly reduced. The  estimated ejection fraction was in the range of 45% to 50%.   Doppler parameters are consistent with elevated ventricular  end-diastolic filling pressure. - Mitral valve: Restricted posterior leaflet motion. There was mild  regurgitation. - Left atrium: The atrium was moderately dilated. - Atrial septum: No defect or patent foramen ovale was  identified.   Neuro/Psych PSYCHIATRIC DISORDERS Anxiety Depression negative neurological ROS     GI/Hepatic Neg liver ROS, GERD  ,  Endo/Other  diabetes  Renal/GU negative Renal ROS     Musculoskeletal   Abdominal   Peds  Hematology   Anesthesia Other Findings Pt refuses to remove earrings despite explanation of increases risk of injury to his ear and damage to his jewelry.    Reproductive/Obstetrics                           Anesthesia Physical Anesthesia Plan  ASA: III  Anesthesia Plan: General   Post-op Pain Management:    Induction: Intravenous  PONV Risk Score and Plan: 4 or greater and Ondansetron, Dexamethasone, Scopolamine patch - Pre-op and Diphenhydramine  Airway Management Planned: Oral ETT  Additional Equipment:   Intra-op Plan:   Post-operative Plan: Extubation in OR  Informed Consent: I have reviewed the patients History and Physical, chart, labs and discussed the procedure including the risks, benefits and alternatives for the proposed anesthesia with the patient or authorized representative who has indicated his/her understanding and acceptance.   Dental advisory given  Plan Discussed with: CRNA and Anesthesiologist  Anesthesia Plan Comments:        Anesthesia Quick Evaluation

## 2018-05-31 NOTE — H&P (Signed)
Kenneth Rios is an 52 y.o. male.   Chief Complaint: colostomy HPI: 53 yo male with multiple medical problems presented with diverticulitis and underwent Hartman's colectomy. He has healed and is ready for reversal.  Past Medical History:  Diagnosis Date  . Anxiety   . CHF (congestive heart failure) (HCC)    "fluttering"  . Depression   . Diabetes mellitus without complication (HCC)   . Diverticulitis   . GERD (gastroesophageal reflux disease)   . Hyperlipidemia   . Hypertension     Past Surgical History:  Procedure Laterality Date  . CARDIAC CATHETERIZATION  03/30/2018  . COLON RESECTION N/A 09/05/2017   Procedure: HARTMAN'S COLECTOMY AND COLOSTOMY;  Surgeon: Sheliah Hatch De Blanch, MD;  Location: [facility] OR;  Service: General;  Laterality: N/A;  . COLON SURGERY    . INTRAVASCULAR PRESSURE WIRE/FFR STUDY N/A 03/30/2018   Procedure: INTRAVASCULAR PRESSURE WIRE/FFR STUDY;  Surgeon: Yvonne Kendall, MD;  Location: [facility] INVASIVE CV LAB;  Service: Cardiovascular;  Laterality: N/A;  . LEFT HEART CATH AND CORONARY ANGIOGRAPHY N/A 03/30/2018   Procedure: LEFT HEART CATH AND CORONARY ANGIOGRAPHY;  Surgeon: Yvonne Kendall, MD;  Location: [facility] INVASIVE CV LAB;  Service: Cardiovascular;  Laterality: N/A;    Family History  Problem Relation Age of Onset  . Heart disease Mother   . Heart disease Sister   . Diabetes Maternal Aunt   . Heart disease Maternal Aunt   . Diabetes Maternal Uncle   . Sudden Cardiac Death Neg Hx    Social History:  reports that he has quit smoking. He has never used smokeless tobacco. He reports that he drinks about 6.0 standard drinks of alcohol per week. He reports that he does not use drugs.  Allergies: No Known Allergies  Medications Prior to Admission  Medication Sig Dispense Refill  . aspirin EC 81 MG tablet Take 81 mg by mouth daily.    Marland Kitchen atorvastatin (LIPITOR) 80 MG tablet Take 1 tablet (80 mg total) by mouth daily. 90 tablet 3  . carvedilol (COREG) 6.25 MG  tablet Take 1 tablet (6.25 mg total) by mouth 2 (two) times daily with a meal. 60 tablet 3  . furosemide (LASIX) 20 MG tablet Take 1 tablet (20 mg total) by mouth daily. 30 tablet 3  . isosorbide mononitrate (IMDUR) 30 MG 24 hr tablet Take 1 tablet (30 mg total) by mouth daily. 30 tablet 6  . lisinopril (PRINIVIL,ZESTRIL) 10 MG tablet Take 1 tablet (10 mg total) by mouth daily. 30 tablet 3  . metFORMIN (GLUCOPHAGE) 500 MG tablet Take 2 tablets (1,000 mg total) by mouth 2 (two) times daily with a meal. 120 tablet 3  . metroNIDAZOLE (FLAGYL) 500 MG tablet Take 1,000 mg by mouth See admin instructions. Take 1000 mg by mouth at 1400, 1500 and 2200 the day prior to procedure  0  . neomycin (MYCIFRADIN) 500 MG tablet Take 1,000 mg by mouth See admin instructions. Take 1000 mg by mouth at 1400, 1500 and 2200 the day prior to procedure  0  . albuterol (PROVENTIL HFA;VENTOLIN HFA) 108 (90 Base) MCG/ACT inhaler Inhale 2 puffs into the lungs every 6 (six) hours as needed for wheezing or shortness of breath.    . empagliflozin (JARDIANCE) 10 MG TABS tablet Take 10 mg by mouth daily. (Patient not taking: Reported on 05/23/2018) 30 tablet 3  . nitroGLYCERIN (NITROSTAT) 0.4 MG SL tablet Place 1 tablet (0.4 mg total) under the tongue every 5 (five) minutes as needed for chest pain.  25 tablet 11  . polyethylene glycol (MIRALAX / GLYCOLAX) packet Take 17 g by mouth daily. (Patient not taking: Reported on 05/23/2018) 14 each 0    Results for orders placed or performed during the hospital encounter of 05/31/18 (from the past 48 hour(s))  Glucose, capillary     Status: Abnormal   Collection Time: 05/31/18  9:41 AM  Result Value Ref Range   Glucose-Capillary 237 (H) 70 - 99 mg/dL   Comment 1 Notify RN    Comment 2 Document in Chart   Glucose, capillary     Status: Abnormal   Collection Time: 05/31/18 10:49 AM  Result Value Ref Range   Glucose-Capillary 215 (H) 70 - 99 mg/dL   Comment 1 Notify RN    Comment 2  Document in Chart    No results found.  Review of Systems  Constitutional: Negative for chills and fever.  HENT: Negative for hearing loss.   Eyes: Negative for blurred vision and double vision.  Respiratory: Negative for cough and hemoptysis.   Cardiovascular: Negative for chest pain and palpitations.  Gastrointestinal: Negative for abdominal pain, nausea and vomiting.  Genitourinary: Negative for dysuria and urgency.  Musculoskeletal: Negative for myalgias and neck pain.  Skin: Negative for itching and rash.  Neurological: Negative for dizziness, tingling and headaches.  Endo/Heme/Allergies: Does not bruise/bleed easily.  Psychiatric/Behavioral: Negative for depression and suicidal ideas.    Blood pressure (!) 182/111, pulse 67, temperature 97.7 F (36.5 C), temperature source Oral, resp. rate 20, height 5\' 9"  (1.753 m), weight 99.8 kg, SpO2 100 %. Physical Exam  Vitals reviewed. Constitutional: He is oriented to person, place, and time. He appears well-developed and well-nourished.  HENT:  Head: Normocephalic and atraumatic.  Eyes: Pupils are equal, round, and reactive to light. Conjunctivae and EOM are normal.  Neck: Normal range of motion. Neck supple.  Cardiovascular: Normal rate and regular rhythm.  Respiratory: Effort normal and breath sounds normal.  GI: Soft. Bowel sounds are normal. He exhibits no distension. There is no tenderness.  Left sided colostomy  Musculoskeletal: Normal range of motion.  Neurological: He is alert and oriented to person, place, and time.  Skin: Skin is warm and dry.  Psychiatric: He has a normal mood and affect. His behavior is normal.     Assessment/Plan 52 yo male with end colostomy -lap assisted colostomy reversal with colorectal anastomosis -admit to inpatient postoperatively  Rodman Pickle, MD 05/31/2018, 11:53 AM

## 2018-05-31 NOTE — Progress Notes (Signed)
Received patient from PACU accompanied by wife.  Patient AOx4, with foley cath, VSS with elevated BP at 174/96 d/t pain at 8/10, PR=82,RR=22,O2Sat at 96% on RA, oriented to room, bed controls and call light.  Administered PRN pain med morphine 2mg  Inj per order.  Explained plan of care to patient and wife.  Will monitor.

## 2018-05-31 NOTE — OR Nursing (Signed)
Patient was informed of risks and potential injury if the silver hoop earrings were not removed before surgery.  Patient did process to tee operating room suite wearing earrings. Patient stated, "They were made to never come out."

## 2018-05-31 NOTE — OR Nursing (Signed)
Pt started oozing from lower surgical site. Dr. Dwain Sarna was called and came to bedside. Pt dressing was changed twice per Dr. Dwain Sarna orders prior to arriving to PACU. MD held pressure for a little bit and oozing has stopped. New dressing placed by MD. Will cont to monitor.

## 2018-05-31 NOTE — Progress Notes (Signed)
Patient ID: Kenneth Rios, male   DOB: 1965/12/07, 52 y.o.   MRN: [id number] S/p lap colostomy takedown.  Midline wound bleeding. Stopped initially with pressure but now bleeding more.  Prepped area and then just oversewed with nylon suture after infiltrating with local.  Not bleeding anymore. Dressings placed.  Changed pain control and gave more antihtn. He did not take his own lisinopril or coreg this am so will give a dose now also.  I suspect both his dm and htn are not well controlled at home.

## 2018-06-01 ENCOUNTER — Other Ambulatory Visit: Payer: Self-pay

## 2018-06-01 ENCOUNTER — Encounter (HOSPITAL_COMMUNITY): Payer: Self-pay | Admitting: General Surgery

## 2018-06-01 LAB — CBC WITH DIFFERENTIAL/PLATELET
Abs Immature Granulocytes: 0.03 10*3/uL (ref 0.00–0.07)
Basophils Absolute: 0 10*3/uL (ref 0.0–0.1)
Basophils Relative: 0 %
EOS ABS: 0 10*3/uL (ref 0.0–0.5)
Eosinophils Relative: 0 %
HCT: 32.5 % — ABNORMAL LOW (ref 39.0–52.0)
HEMOGLOBIN: 11 g/dL — AB (ref 13.0–17.0)
Immature Granulocytes: 0 %
LYMPHS ABS: 1.9 10*3/uL (ref 0.7–4.0)
LYMPHS PCT: 22 %
MCH: 27.4 pg (ref 26.0–34.0)
MCHC: 33.8 g/dL (ref 30.0–36.0)
MCV: 80.8 fL (ref 80.0–100.0)
MONOS PCT: 6 %
Monocytes Absolute: 0.5 10*3/uL (ref 0.1–1.0)
NEUTROS PCT: 72 %
Neutro Abs: 6 10*3/uL (ref 1.7–7.7)
Platelets: 291 10*3/uL (ref 150–400)
RBC: 4.02 MIL/uL — ABNORMAL LOW (ref 4.22–5.81)
RDW: 12.1 % (ref 11.5–15.5)
WBC: 8.5 10*3/uL (ref 4.0–10.5)
nRBC: 0 % (ref 0.0–0.2)

## 2018-06-01 LAB — BASIC METABOLIC PANEL
Anion gap: 5 (ref 5–15)
BUN: 9 mg/dL (ref 6–20)
CALCIUM: 7.9 mg/dL — AB (ref 8.9–10.3)
CHLORIDE: 103 mmol/L (ref 98–111)
CO2: 25 mmol/L (ref 22–32)
CREATININE: 1.09 mg/dL (ref 0.61–1.24)
GFR calc non Af Amer: >60 mL/min (ref >60)
Glucose, Bld: 223 mg/dL — ABNORMAL HIGH (ref 70–99)
Potassium: 3.8 mmol/L (ref 3.5–5.1)
SODIUM: 133 mmol/L — AB (ref 135–145)

## 2018-06-01 LAB — GLUCOSE, CAPILLARY
GLUCOSE-CAPILLARY: 248 mg/dL — AB (ref 70–99)
Glucose-Capillary: 244 mg/dL — ABNORMAL HIGH (ref 70–99)
Glucose-Capillary: 223 mg/dL — ABNORMAL HIGH (ref 70–99)

## 2018-06-01 MED ORDER — INSULIN ASPART 100 UNIT/ML ~~LOC~~ SOLN
0.0000 [IU] | Freq: Every day | SUBCUTANEOUS | Status: DC
  Administered 2018-06-01 – 2018-06-02 (×2): 2 [IU] via SUBCUTANEOUS

## 2018-06-01 MED ORDER — INSULIN ASPART 100 UNIT/ML ~~LOC~~ SOLN
0.0000 [IU] | Freq: Three times a day (TID) | SUBCUTANEOUS | Status: DC
  Administered 2018-06-01 – 2018-06-02 (×4): 5 [IU] via SUBCUTANEOUS
  Administered 2018-06-02: 2 [IU] via SUBCUTANEOUS
  Administered 2018-06-03: 3 [IU] via SUBCUTANEOUS
  Administered 2018-06-03: 5 [IU] via SUBCUTANEOUS
  Administered 2018-06-03: 2 [IU] via SUBCUTANEOUS
  Administered 2018-06-04: 3 [IU] via SUBCUTANEOUS
  Administered 2018-06-04: 5 [IU] via SUBCUTANEOUS
  Administered 2018-06-04 – 2018-06-06 (×6): 3 [IU] via SUBCUTANEOUS
  Administered 2018-06-07 (×2): 2 [IU] via SUBCUTANEOUS

## 2018-06-01 MED ORDER — METFORMIN HCL 500 MG PO TABS
1000.0000 mg | ORAL_TABLET | Freq: Two times a day (BID) | ORAL | Status: DC
  Administered 2018-06-01 – 2018-06-07 (×12): 1000 mg via ORAL
  Filled 2018-06-01 (×12): qty 2

## 2018-06-01 MED ORDER — INFLUENZA VAC SPLIT QUAD 0.5 ML IM SUSY
0.5000 mL | PREFILLED_SYRINGE | INTRAMUSCULAR | Status: AC
  Administered 2018-06-02: 0.5 mL via INTRAMUSCULAR
  Filled 2018-06-01: qty 0.5

## 2018-06-01 NOTE — Progress Notes (Signed)
Patient complaining of shooting pain in the abdomen. States he feels soft food initiated the pain. MD notified.

## 2018-06-01 NOTE — Progress Notes (Addendum)
Inpatient Diabetes Program Recommendations  AACE/ADA: New Consensus Statement on Inpatient Glycemic Control (2015)  Target Ranges:  Prepandial:   less than 140 mg/dL      Peak postprandial:   less than 180 mg/dL (1-2 hours)      Critically ill patients:  140 - 180 mg/dL   Lab Results  Component Value Date   GLUCAP 223 (H) 05/31/2018   HGBA1C 10.6 (H) 05/29/2018   Review of Glycemic Control  Diabetes history: DM 2 Outpatient Diabetes medications: Metformin 1000 mg BID Current orders for Inpatient glycemic control: None  Inpatient Diabetes Program Recommendations:    Patient with History of DM 2. A1c significantly elevated at 10.6%. Presenting glucose to the OR was 237 mg/dl. Decadron 10 mg also given at 1518 yesterday.   Patient may benefit from long acting insulin at time of d/c to prevent post op complications.  While inpatient consider CBGs ACHS and Lantus 12-15 units Q24 hours and Novolog 0-15 units tid correction scale.  Will see patient today in regards to outpatient glucose control and insulin potential at time of d/c if they are open to it.  Thanks,  Christena Deem RN, MSN, BC-ADM Inpatient Diabetes Coordinator Team Pager [phone] (8a-5p)  1400 pm: Spoke with patient and wife in room. Discussed A1c level and concern for wound healing while only on metformin at home. Patient and wife agreeable to insulin at the end of conversation. Showed patient and wife the insulin pen. Reviewed all steps if insulin pen including attachment of needle, 2-unit air shot, dialing up dose, giving injection, removing needle, disposal of sharps, storage of unused insulin, disposal of insulin etc.

## 2018-06-01 NOTE — Progress Notes (Signed)
Patient had moderate liquid clear red stools x2 around 0044 and 0545 today.

## 2018-06-01 NOTE — Progress Notes (Signed)
  Progress Note: General Surgery Service   Assessment/Plan: Active Problems:   Diverticulitis large intestine  s/p Procedure(s): LAPAROSCOPIC COLOSTOMY REVERSAL COLORECTAL ANASTOMOSIS ERAS PATHWAY 05/31/2018 Required stitch of subcutaneous bleeding yesterday -advance diet -up to chair -dc foley -continue multimodal pain therapy    LOS: 1 day  Chief Complaint/Subjective: 2 bloody bowel movements overnight, moderate pain over left sided incision, no nausea  Objective: Vital signs in last 24 hours: Temp:  [97.1 F (36.2 C)-99.2 F (37.3 C)] 98.2 F (36.8 C) (10/24 0534) Pulse Rate:  [67-89] 78 (10/24 0534) Resp:  [16-31] 16 (10/24 0534) BP: (132-189)/(83-111) 132/83 (10/24 0534) SpO2:  [96 %-100 %] 98 % (10/24 0534) Weight:  [99.8 kg] 99.8 kg (10/23 0936)    Intake/Output from previous day: 10/23 0701 - 10/24 0700 In: 3752.6 [P.O.:420; I.V.:1875.7; IV Piggyback:1456.9] Out: 1555 [Urine:1450; Blood:105] Intake/Output this shift: No intake/output data recorded.  Lungs: CTAB  Cardiovascular: RRR  Abd: soft, ATTP, open wound with clean base  Extremities: no edema  Neuro: AOx4  Lab Results: CBC  No results for input(s): WBC, HGB, HCT, PLT in the last 72 hours. BMET No results for input(s): NA, K, CL, CO2, GLUCOSE, BUN, CREATININE, CALCIUM in the last 72 hours. PT/INR No results for input(s): LABPROT, INR in the last 72 hours. ABG No results for input(s): PHART, HCO3 in the last 72 hours.  Invalid input(s): PCO2, PO2  Studies/Results:  Anti-infectives: Anti-infectives (From admission, onward)   Start     Dose/Rate Route Frequency Ordered Stop   05/31/18 0930  cefoTEtan in Dextrose 5% (CEFOTAN) IVPB 2 g     2 g 100 mL/hr over 30 Minutes Intravenous On call to O.R. 05/31/18 0924 05/31/18 1235      Medications: Scheduled Meds: . carvedilol  6.25 mg Oral BID WC  . enoxaparin (LOVENOX) injection  40 mg Subcutaneous Q24H  . gabapentin  300 mg Oral BID  .  isosorbide mononitrate  30 mg Oral Daily   Continuous Infusions: . acetaminophen Stopped (06/01/18 0624)  . dextrose 5 % and 0.45% NaCl 100 mL/hr at 06/01/18 0834   PRN Meds:.albuterol, hydrALAZINE, HYDROcodone-acetaminophen, morphine injection, nitroGLYCERIN, ondansetron **OR** ondansetron (ZOFRAN) IV  Rodman Pickle, MD Pg# [phone] [facility]

## 2018-06-02 LAB — BASIC METABOLIC PANEL
Anion gap: 5 (ref 5–15)
Anion gap: 4 — ABNORMAL LOW (ref 5–15)
BUN: 9 mg/dL (ref 6–20)
BUN: 8 mg/dL (ref 6–20)
CALCIUM: 8.2 mg/dL — AB (ref 8.9–10.3)
CO2: 26 mmol/L (ref 22–32)
CO2: 27 mmol/L (ref 22–32)
CREATININE: 1.07 mg/dL (ref 0.61–1.24)
CREATININE: 0.93 mg/dL (ref 0.61–1.24)
Calcium: 8 mg/dL — ABNORMAL LOW (ref 8.9–10.3)
Chloride: 106 mmol/L (ref 98–111)
Chloride: 107 mmol/L (ref 98–111)
GFR calc Af Amer: >60 mL/min (ref >60)
GFR calc Af Amer: >60 mL/min (ref >60)
GLUCOSE: 254 mg/dL — AB (ref 70–99)
GLUCOSE: 235 mg/dL — AB (ref 70–99)
POTASSIUM: 3.9 mmol/L (ref 3.5–5.1)
POTASSIUM: 3.6 mmol/L (ref 3.5–5.1)
SODIUM: 137 mmol/L (ref 135–145)
SODIUM: 138 mmol/L (ref 135–145)

## 2018-06-02 LAB — CBC WITH DIFFERENTIAL/PLATELET
Abs Immature Granulocytes: 0.03 10*3/uL (ref 0.00–0.07)
BASOS PCT: 1 %
Basophils Absolute: 0 10*3/uL (ref 0.0–0.1)
EOS ABS: 0.4 10*3/uL (ref 0.0–0.5)
Eosinophils Relative: 4 %
HCT: 28.9 % — ABNORMAL LOW (ref 39.0–52.0)
Hemoglobin: 9.9 g/dL — ABNORMAL LOW (ref 13.0–17.0)
Immature Granulocytes: 0 %
Lymphocytes Relative: 38 %
Lymphs Abs: 3.4 10*3/uL (ref 0.7–4.0)
MCH: 28.2 pg (ref 26.0–34.0)
MCHC: 34.3 g/dL (ref 30.0–36.0)
MCV: 82.3 fL (ref 80.0–100.0)
MONO ABS: 0.5 10*3/uL (ref 0.1–1.0)
MONOS PCT: 6 %
NEUTROS ABS: 4.5 10*3/uL (ref 1.7–7.7)
Neutrophils Relative %: 51 %
PLATELETS: 286 10*3/uL (ref 150–400)
RBC: 3.51 MIL/uL — AB (ref 4.22–5.81)
RDW: 12.6 % (ref 11.5–15.5)
WBC: 8.9 10*3/uL (ref 4.0–10.5)
nRBC: 0 % (ref 0.0–0.2)

## 2018-06-02 LAB — CBC
HCT: 25.3 % — ABNORMAL LOW (ref 39.0–52.0)
HEMOGLOBIN: 8.7 g/dL — AB (ref 13.0–17.0)
MCH: 28.1 pg (ref 26.0–34.0)
MCHC: 34.4 g/dL (ref 30.0–36.0)
MCV: 81.6 fL (ref 80.0–100.0)
Platelets: 231 10*3/uL (ref 150–400)
RBC: 3.1 MIL/uL — ABNORMAL LOW (ref 4.22–5.81)
RDW: 12.6 % (ref 11.5–15.5)
WBC: 6.6 10*3/uL (ref 4.0–10.5)
nRBC: 0 % (ref 0.0–0.2)

## 2018-06-02 LAB — GLUCOSE, CAPILLARY
GLUCOSE-CAPILLARY: 222 mg/dL — AB (ref 70–99)
Glucose-Capillary: 135 mg/dL — ABNORMAL HIGH (ref 70–99)
Glucose-Capillary: 214 mg/dL — ABNORMAL HIGH (ref 70–99)

## 2018-06-02 MED ORDER — OXYCODONE HCL 5 MG PO TABS
5.0000 mg | ORAL_TABLET | ORAL | Status: DC | PRN
  Administered 2018-06-03 – 2018-06-05 (×7): 10 mg via ORAL
  Filled 2018-06-02 (×9): qty 2

## 2018-06-02 MED ORDER — METHOCARBAMOL 1000 MG/10ML IJ SOLN
500.0000 mg | Freq: Three times a day (TID) | INTRAVENOUS | Status: DC | PRN
  Administered 2018-06-03 – 2018-06-06 (×4): 500 mg via INTRAVENOUS
  Filled 2018-06-02 (×6): qty 5

## 2018-06-02 MED ORDER — INSULIN GLARGINE 100 UNIT/ML ~~LOC~~ SOLN
10.0000 [IU] | Freq: Every day | SUBCUTANEOUS | Status: DC
  Administered 2018-06-03 – 2018-06-04 (×3): 10 [IU] via SUBCUTANEOUS
  Filled 2018-06-02 (×4): qty 0.1

## 2018-06-02 MED ORDER — ALVIMOPAN 12 MG PO CAPS
12.0000 mg | ORAL_CAPSULE | Freq: Two times a day (BID) | ORAL | Status: DC
  Administered 2018-06-02 – 2018-06-04 (×5): 12 mg via ORAL
  Filled 2018-06-02 (×5): qty 1

## 2018-06-02 MED ORDER — KETOROLAC TROMETHAMINE 15 MG/ML IJ SOLN
15.0000 mg | Freq: Once | INTRAMUSCULAR | Status: AC
  Administered 2018-06-02: 15 mg via INTRAVENOUS
  Filled 2018-06-02: qty 1

## 2018-06-02 MED ORDER — ENSURE SURGERY PO LIQD
237.0000 mL | Freq: Two times a day (BID) | ORAL | Status: DC
  Administered 2018-06-02 – 2018-06-06 (×8): 237 mL via ORAL
  Filled 2018-06-02 (×11): qty 237

## 2018-06-02 MED ORDER — BACITRACIN ZINC 500 UNIT/GM EX OINT
TOPICAL_OINTMENT | CUTANEOUS | Status: AC
  Filled 2018-06-02: qty 28.35

## 2018-06-02 MED ORDER — ACETAMINOPHEN 325 MG PO TABS
650.0000 mg | ORAL_TABLET | Freq: Four times a day (QID) | ORAL | Status: DC
  Administered 2018-06-02 – 2018-06-07 (×18): 650 mg via ORAL
  Filled 2018-06-02 (×19): qty 2

## 2018-06-02 MED ORDER — HYDROMORPHONE HCL 1 MG/ML IJ SOLN
1.0000 mg | INTRAMUSCULAR | Status: DC | PRN
  Administered 2018-06-03 – 2018-06-07 (×14): 1 mg via INTRAVENOUS
  Filled 2018-06-02 (×14): qty 1

## 2018-06-02 NOTE — Progress Notes (Signed)
  Progress Note: General Surgery Service   Assessment/Plan: Active Problems:   Diverticulitis large intestine  s/p Procedure(s): LAPAROSCOPIC COLOSTOMY REVERSAL COLORECTAL ANASTOMOSIS ERAS PATHWAY 05/31/2018 -continue soft diet -continue pain control -f/u labs -ambulate -await return bowel function    LOS: 2 days  Chief Complaint/Subjective: Bloody bowel movements yesterday, continue soreness and left sided abdominal pain  Objective: Vital signs in last 24 hours: Temp:  [98.2 F (36.8 C)-99.6 F (37.6 C)] 98.2 F (36.8 C) (10/25 0424) Pulse Rate:  [78-96] 84 (10/25 0424) Resp:  [16-18] 16 (10/25 0424) BP: (123-157)/(58-90) 140/84 (10/25 0424) SpO2:  [100 %] 100 % (10/25 0424) Last BM Date: 06/01/18  Intake/Output from previous day: 10/24 0701 - 10/25 0700 In: 1545 [P.O.:720; I.V.:825] Out: 1095 [Urine:1095] Intake/Output this shift: No intake/output data recorded.  Lungs: nonlabored breathing  Cardiovascular: RRR  Abd: soft, ATTP, incisions c/d/i, left sided incision open with clean base  Extremities: no edema  Neuro: AOx4  Lab Results: CBC  Recent Labs    06/01/18 0832  WBC 8.5  HGB 11.0*  HCT 32.5*  PLT 291   BMET Recent Labs    06/01/18 0832  NA 133*  K 3.8  CL 103  CO2 25  GLUCOSE 223*  BUN 9  CREATININE 1.09  CALCIUM 7.9*   PT/INR No results for input(s): LABPROT, INR in the last 72 hours. ABG No results for input(s): PHART, HCO3 in the last 72 hours.  Invalid input(s): PCO2, PO2  Studies/Results:  Anti-infectives: Anti-infectives (From admission, onward)   Start     Dose/Rate Route Frequency Ordered Stop   05/31/18 0930  cefoTEtan in Dextrose 5% (CEFOTAN) IVPB 2 g     2 g 100 mL/hr over 30 Minutes Intravenous On call to O.R. 05/31/18 0924 05/31/18 1235      Medications: Scheduled Meds: . carvedilol  6.25 mg Oral BID WC  . enoxaparin (LOVENOX) injection  40 mg Subcutaneous Q24H  . gabapentin  300 mg Oral BID  .  Influenza vac split quadrivalent PF  0.5 mL Intramuscular Tomorrow-1000  . insulin aspart  0-15 Units Subcutaneous TID WC  . insulin aspart  0-5 Units Subcutaneous QHS  . isosorbide mononitrate  30 mg Oral Daily  . metFORMIN  1,000 mg Oral BID WC   Continuous Infusions: . dextrose 5 % and 0.45% NaCl 100 mL/hr at 06/02/18 0413   PRN Meds:.albuterol, hydrALAZINE, HYDROcodone-acetaminophen, morphine injection, nitroGLYCERIN, ondansetron **OR** ondansetron (ZOFRAN) IV  Rodman Pickle, MD Pg# [phone] [facility]

## 2018-06-02 NOTE — Progress Notes (Signed)
Inpatient Diabetes Program Recommendations  AACE/ADA: New Consensus Statement on Inpatient Glycemic Control (2015)  Target Ranges:  Prepandial:   less than 140 mg/dL      Peak postprandial:   less than 180 mg/dL (1-2 hours)      Critically ill patients:  140 - 180 mg/dL   Review of Glycemic Control  Diabetes history: DM 2 Outpatient Diabetes medications: Metformin 1000 mg BID Current orders for Inpatient glycemic control: None  Inpatient Diabetes Program Recommendations:    Patient with History of DM 2. A1c significantly elevated at 10.6%. Presenting glucose to the OR was 237 mg/dl.    Patient may benefit from long acting insulin at time of d/c to prevent post op complications.  While inpatient consider CBGs ACHS and Lantus 12-15 units Q24 hours and Novolog 0-15 units tid correction scale.  Patient and wife open to insulin at time of d/c.  Thanks,  Christena Deem RN, MSN, BC-ADM Inpatient Diabetes Coordinator Team Pager [phone] (8a-5p)

## 2018-06-03 LAB — CBC
HCT: 24.6 % — ABNORMAL LOW (ref 39.0–52.0)
Hemoglobin: 8.4 g/dL — ABNORMAL LOW (ref 13.0–17.0)
MCH: 27.9 pg (ref 26.0–34.0)
MCHC: 34.1 g/dL (ref 30.0–36.0)
MCV: 81.7 fL (ref 80.0–100.0)
Platelets: 230 10*3/uL (ref 150–400)
RBC: 3.01 MIL/uL — ABNORMAL LOW (ref 4.22–5.81)
RDW: 12.5 % (ref 11.5–15.5)
WBC: 6.3 10*3/uL (ref 4.0–10.5)
nRBC: 0 % (ref 0.0–0.2)

## 2018-06-03 LAB — GLUCOSE, CAPILLARY
GLUCOSE-CAPILLARY: 197 mg/dL — AB (ref 70–99)
GLUCOSE-CAPILLARY: 242 mg/dL — AB (ref 70–99)
Glucose-Capillary: 148 mg/dL — ABNORMAL HIGH (ref 70–99)
Glucose-Capillary: 161 mg/dL — ABNORMAL HIGH (ref 70–99)

## 2018-06-03 LAB — BASIC METABOLIC PANEL
ANION GAP: 7 (ref 5–15)
BUN: 8 mg/dL (ref 6–20)
CALCIUM: 8.3 mg/dL — AB (ref 8.9–10.3)
CO2: 27 mmol/L (ref 22–32)
Chloride: 105 mmol/L (ref 98–111)
Creatinine, Ser: 0.91 mg/dL (ref 0.61–1.24)
GFR calc Af Amer: >60 mL/min (ref >60)
GFR calc non Af Amer: >60 mL/min (ref >60)
GLUCOSE: 165 mg/dL — AB (ref 70–99)
Potassium: 3.5 mmol/L (ref 3.5–5.1)
SODIUM: 139 mmol/L (ref 135–145)

## 2018-06-03 NOTE — Progress Notes (Signed)
[facility]:  [phone] General Surgery Progress Note   LOS: 3 days  POD -  3 Days Post-Op  Chief Complaint: colostomy  Assessment and Plan: 1.  LAPAROSCOPIC COLOSTOMY REVERSAL COLORECTAL ANASTOMOSIS - 05/31/2018 - [place]  Pain control is the issue - though he seems better today.  Needs to ambulate more.   2.  DVT prophylaxis - Lovenox 3.  DM  Gluc - 165 - 06/03/2018 4.  HTN 5.  Anemia   Hgb - 8.4 - 06/03/2018   Active Problems:   Diverticulitis large intestine  Subjective:  Discussed pain control, which seems better.  No flatus or BM.  He needs to ambulate more.  No IS.  Mother, Earnie Larsson, and [place], [place], at bedside  Objective:   Vitals:   06/03/18 0429 06/03/18 0635  BP: (!) 182/98 (!) 169/95  Pulse: 83   Resp: 16   Temp: 98.2 F (36.8 C)   SpO2: 100%      Intake/Output from previous day:  10/25 0701 - 10/26 0700 In: 1963.8 [P.O.:440; I.V.:1473.8; IV Piggyback:50] Out: 700 [Urine:700]  Intake/Output this shift:  No intake/output data recorded.   Physical Exam:   General: WN AA M who is alert and oriented.    HEENT: Normal. Pupils equal. .   Lungs: Clear   Abdomen: Distended, but not tight.  Has BS.   Wound: Clean.   Lab Results:    Recent Labs    06/02/18 1220 06/03/18 0404  WBC 6.6 6.3  HGB 8.7* 8.4*  HCT 25.3* 24.6*  PLT 231 230    BMET   Recent Labs    06/02/18 1220 06/03/18 0404  NA 138 139  K 3.6 3.5  CL 107 105  CO2 27 27  GLUCOSE 235* 165*  BUN 8 8  CREATININE 0.93 0.91  CALCIUM 8.0* 8.3*    PT/INR  No results for input(s): LABPROT, INR in the last 72 hours.  ABG  No results for input(s): PHART, HCO3 in the last 72 hours.  Invalid input(s): PCO2, PO2   Studies/Results:  No results found.   Anti-infectives:   Anti-infectives (From admission, onward)   Start     Dose/Rate Route Frequency Ordered Stop   05/31/18 0930  cefoTEtan in Dextrose 5% (CEFOTAN) IVPB 2 g     2 g 100 mL/hr  over 30 Minutes Intravenous On call to O.R. 05/31/18 0924 05/31/18 1235      Ovidio Kin, MD, FACS Pager: [phone] [facility] Office: [phone] 06/03/2018

## 2018-06-03 NOTE — Plan of Care (Signed)
  Problem: Pain Managment: Goal: General experience of comfort will improve Outcome: Progressing   Problem: Elimination: Goal: Will not experience complications related to bowel motility Outcome: Progressing   

## 2018-06-03 NOTE — Progress Notes (Signed)
MD on call notified that patient pain management is not working for the patient. Informed that patient is tolerating his diet and having bloody stool and mucus. New orders received will continue to monitor. Ilean Skill LPN

## 2018-06-04 LAB — GLUCOSE, CAPILLARY
GLUCOSE-CAPILLARY: 242 mg/dL — AB (ref 70–99)
Glucose-Capillary: 163 mg/dL — ABNORMAL HIGH (ref 70–99)
Glucose-Capillary: 158 mg/dL — ABNORMAL HIGH (ref 70–99)

## 2018-06-04 MED ORDER — KETOROLAC TROMETHAMINE 30 MG/ML IJ SOLN
30.0000 mg | Freq: Three times a day (TID) | INTRAMUSCULAR | Status: DC | PRN
  Administered 2018-06-04 – 2018-06-07 (×4): 30 mg via INTRAVENOUS
  Filled 2018-06-04 (×6): qty 1

## 2018-06-04 NOTE — Progress Notes (Signed)
[facility]:  [phone] General Surgery Progress Note   LOS: 4 days  POD -  4 Days Post-Op  Chief Complaint: colostomy  Assessment and Plan: 1.  LAPAROSCOPIC COLOSTOMY REVERSAL COLORECTAL ANASTOMOSIS - 05/31/2018 - [place]  Pain control better.  Still needs to ambulate more, but doing better.  Looks better today  On soft diet   2.  DVT prophylaxis - Lovenox 3.  DM  Gluc - 163 - 06/04/2018 4.  HTN 5.  Anemia   Hgb - 8.4 - 06/03/2018   Active Problems:   Diverticulitis large intestine  Subjective:  Looks much better today.  Stressed ambulation.  Will leave on soft diet, he is not ready to advance.  He has had some BM's.  Tiffany, fiancee, at bedside  Objective:   Vitals:   06/04/18 0129 06/04/18 0446  BP: (!) 163/91 (!) 166/97  Pulse: 87 93  Resp:  18  Temp:  98.6 F (37 C)  SpO2:  100%     Intake/Output from previous day:  10/26 0701 - 10/27 0700 In: 2381.9 [P.O.:660; I.V.:1675.7; IV Piggyback:46.2] Out: -   Intake/Output this shift:  No intake/output data recorded.   Physical Exam:   General: WN AA M who is alert and oriented.    HEENT: Normal. Pupils equal. .   Lungs: Clear.  IS=1,500 cc   Abdomen: Abdomen softer. Has BS.   Wound: Clean.   Lab Results:    Recent Labs    06/02/18 1220 06/03/18 0404  WBC 6.6 6.3  HGB 8.7* 8.4*  HCT 25.3* 24.6*  PLT 231 230    BMET   Recent Labs    06/02/18 1220 06/03/18 0404  NA 138 139  K 3.6 3.5  CL 107 105  CO2 27 27  GLUCOSE 235* 165*  BUN 8 8  CREATININE 0.93 0.91  CALCIUM 8.0* 8.3*    PT/INR  No results for input(s): LABPROT, INR in the last 72 hours.  ABG  No results for input(s): PHART, HCO3 in the last 72 hours.  Invalid input(s): PCO2, PO2   Studies/Results:  No results found.   Anti-infectives:   Anti-infectives (From admission, onward)   Start     Dose/Rate Route Frequency Ordered Stop   05/31/18 0930  cefoTEtan in Dextrose 5% (CEFOTAN) IVPB 2 g     2  g 100 mL/hr over 30 Minutes Intravenous On call to O.R. 05/31/18 0924 05/31/18 1235      Ovidio Kin, MD, FACS Pager: [phone] [facility] Office: [phone] 06/04/2018

## 2018-06-04 NOTE — Anesthesia Postprocedure Evaluation (Signed)
Anesthesia Post Note  Patient: Kenneth Rios  Procedure(s) Performed: LAPAROSCOPIC COLOSTOMY REVERSAL COLORECTAL ANASTOMOSIS ERAS PATHWAY (N/A Abdomen)     Patient location during evaluation: PACU Anesthesia Type: General Level of consciousness: sedated Pain management: pain level controlled Vital Signs Assessment: post-procedure vital signs reviewed and stable Respiratory status: spontaneous breathing and respiratory function stable Cardiovascular status: stable Postop Assessment: no apparent nausea or vomiting Anesthetic complications: no                Lakindra Wible DANIEL

## 2018-06-05 DIAGNOSIS — I10 Essential (primary) hypertension: Secondary | ICD-10-CM

## 2018-06-05 DIAGNOSIS — E119 Type 2 diabetes mellitus without complications: Secondary | ICD-10-CM

## 2018-06-05 HISTORY — DX: Essential (primary) hypertension: I10

## 2018-06-05 LAB — GLUCOSE, CAPILLARY
GLUCOSE-CAPILLARY: 175 mg/dL — AB (ref 70–99)
GLUCOSE-CAPILLARY: 164 mg/dL — AB (ref 70–99)
Glucose-Capillary: 242 mg/dL — ABNORMAL HIGH (ref 70–99)
Glucose-Capillary: 186 mg/dL — ABNORMAL HIGH (ref 70–99)
Glucose-Capillary: 170 mg/dL — ABNORMAL HIGH (ref 70–99)
Glucose-Capillary: 128 mg/dL — ABNORMAL HIGH (ref 70–99)

## 2018-06-05 LAB — MAGNESIUM: Magnesium: 1.7 mg/dL (ref 1.7–2.4)

## 2018-06-05 LAB — CBC WITH DIFFERENTIAL/PLATELET
Abs Immature Granulocytes: 0.04 10*3/uL (ref 0.00–0.07)
BASOS ABS: 0 10*3/uL (ref 0.0–0.1)
Basophils Relative: 0 %
EOS ABS: 0.4 10*3/uL (ref 0.0–0.5)
Eosinophils Relative: 7 %
HEMATOCRIT: 27.2 % — AB (ref 39.0–52.0)
Hemoglobin: 9.2 g/dL — ABNORMAL LOW (ref 13.0–17.0)
Immature Granulocytes: 1 %
LYMPHS ABS: 2.4 10*3/uL (ref 0.7–4.0)
LYMPHS PCT: 41 %
MCH: 27.5 pg (ref 26.0–34.0)
MCHC: 33.8 g/dL (ref 30.0–36.0)
MCV: 81.2 fL (ref 80.0–100.0)
Monocytes Absolute: 0.4 10*3/uL (ref 0.1–1.0)
Monocytes Relative: 7 %
NRBC: 0 % (ref 0.0–0.2)
Neutro Abs: 2.6 10*3/uL (ref 1.7–7.7)
Neutrophils Relative %: 44 %
Platelets: 331 10*3/uL (ref 150–400)
RBC: 3.35 MIL/uL — ABNORMAL LOW (ref 4.22–5.81)
RDW: 12.5 % (ref 11.5–15.5)
WBC: 5.8 10*3/uL (ref 4.0–10.5)

## 2018-06-05 LAB — BASIC METABOLIC PANEL
Anion gap: 6 (ref 5–15)
BUN: 6 mg/dL (ref 6–20)
CHLORIDE: 105 mmol/L (ref 98–111)
CO2: 26 mmol/L (ref 22–32)
Calcium: 8.8 mg/dL — ABNORMAL LOW (ref 8.9–10.3)
Creatinine, Ser: 0.81 mg/dL (ref 0.61–1.24)
GFR calc Af Amer: >60 mL/min (ref >60)
GFR calc non Af Amer: >60 mL/min (ref >60)
GLUCOSE: 190 mg/dL — AB (ref 70–99)
POTASSIUM: 3.3 mmol/L — AB (ref 3.5–5.1)
Sodium: 137 mmol/L (ref 135–145)

## 2018-06-05 MED ORDER — FUROSEMIDE 20 MG PO TABS
20.0000 mg | ORAL_TABLET | Freq: Every day | ORAL | Status: DC
  Administered 2018-06-05 – 2018-06-07 (×3): 20 mg via ORAL
  Filled 2018-06-05 (×3): qty 1

## 2018-06-05 MED ORDER — INSULIN GLARGINE 100 UNIT/ML ~~LOC~~ SOLN
15.0000 [IU] | Freq: Every day | SUBCUTANEOUS | Status: DC
  Administered 2018-06-05 – 2018-06-06 (×2): 15 [IU] via SUBCUTANEOUS
  Filled 2018-06-05 (×3): qty 0.15

## 2018-06-05 MED ORDER — LISINOPRIL 10 MG PO TABS
10.0000 mg | ORAL_TABLET | Freq: Every day | ORAL | Status: DC
  Administered 2018-06-05 – 2018-06-07 (×3): 10 mg via ORAL
  Filled 2018-06-05 (×3): qty 1

## 2018-06-05 MED ORDER — CLONIDINE HCL 0.2 MG PO TABS
0.2000 mg | ORAL_TABLET | Freq: Three times a day (TID) | ORAL | Status: DC | PRN
  Administered 2018-06-05 – 2018-06-06 (×2): 0.2 mg via ORAL
  Filled 2018-06-05 (×3): qty 1

## 2018-06-05 MED ORDER — SUMATRIPTAN SUCCINATE 6 MG/0.5ML ~~LOC~~ SOLN
6.0000 mg | Freq: Two times a day (BID) | SUBCUTANEOUS | Status: DC | PRN
  Administered 2018-06-05 – 2018-06-06 (×2): 6 mg via SUBCUTANEOUS
  Filled 2018-06-05 (×2): qty 0.5

## 2018-06-05 NOTE — Consult Note (Addendum)
Medical Consultation   Kenneth Rios  ZOX:[id number]  DOB: 09/25/1965  DOA: 05/31/2018  PCP: Hoy Register, MD   Outpatient Specialists: Revankar - cardiology    Requesting physician: Andrey Campanile - surgery  Reason for consultation: Uncontrolled HTN and DM since admission.  Home BP meds have been reordered.  He is c/o headache.  Clonidine added.  Needs assistance with BP control.  Uncertain about control at home.  Also with DM.   History of Present Illness: Kenneth Rios is an 52 y.o. male with h/o HTN; HLD; DM; and diverticulitis s/p colostomy take-down.  BP at home is "haywire ' usually at home when he takes his medicine it kind of regulates it."  He is compliant with it but they do not check it regularly.  Gluxose is usually in 200 range at home.  He takes Metformin 500 mg BID and Jardiance - but Medicaid wouldn't pay for the Jardiance.  While in the hospital, possibly related to pain, his sugar and BP has been high.  Today is the 4th day of headache with pain behind his eye on the left, still present.  +nausea.  He is taking PO, he is doing "so so" with that.  He has not had a BM but he is having flatus.  No N/W/T of arms/legs.  No fevers.   Review of Systems:  ROS As per HPI otherwise 10 point review of systems negative.    Past Medical History: Past Medical History:  Diagnosis Date  . Anxiety   . CHF (congestive heart failure) (HCC)    "fluttering"  . Depression   . Diabetes mellitus without complication (HCC)   . Diverticulitis   . GERD (gastroesophageal reflux disease)   . Hyperlipidemia   . Hypertension     Past Surgical History: Past Surgical History:  Procedure Laterality Date  . CARDIAC CATHETERIZATION  03/30/2018  . COLON RESECTION N/A 09/05/2017   Procedure: HARTMAN'S COLECTOMY AND COLOSTOMY;  Surgeon: Sheliah Hatch De Blanch, MD;  Location: [facility] OR;  Service: General;  Laterality: N/A;  . COLON SURGERY    . COLOSTOMY TAKEDOWN N/A 05/31/2018    Procedure: LAPAROSCOPIC COLOSTOMY REVERSAL COLORECTAL ANASTOMOSIS ERAS PATHWAY;  Surgeon: [name], De Blanch, MD;  Location: [facility] OR;  Service: General;  Laterality: N/A;  . INTRAVASCULAR PRESSURE WIRE/FFR STUDY N/A 03/30/2018   Procedure: INTRAVASCULAR PRESSURE WIRE/FFR STUDY;  Surgeon: Yvonne Kendall, MD;  Location: [facility] INVASIVE CV LAB;  Service: Cardiovascular;  Laterality: N/A;  . LEFT HEART CATH AND CORONARY ANGIOGRAPHY N/A 03/30/2018   Procedure: LEFT HEART CATH AND CORONARY ANGIOGRAPHY;  Surgeon: Yvonne Kendall, MD;  Location: [facility] INVASIVE CV LAB;  Service: Cardiovascular;  Laterality: N/A;     Allergies:  No Known Allergies   Social History:  reports that he has quit smoking. He has never used smokeless tobacco. He reports that he drinks about 6.0 standard drinks of alcohol per week. He reports that he does not use drugs.   Family History: Family History  Problem Relation Age of Onset  . Heart disease Mother   . Heart disease Sister   . Diabetes Maternal Aunt   . Heart disease Maternal Aunt   . Diabetes Maternal Uncle   . Sudden Cardiac Death Neg Hx       Physical Exam: Vitals:   06/04/18 1315 06/04/18 2038 06/05/18 0414 06/05/18 0750  BP: (!) 150/86 (!) 169/91 (!) 183/103 (!) 220/160  Pulse: 89  79 78 90  Resp: 18 18 18 16   Temp: 98.3 F (36.8 C) 98.6 F (37 C) 98.2 F (36.8 C) 98.5 F (36.9 C)  TempSrc: Oral Oral Oral Oral  SpO2: 99% 99% 100% 100%  Weight:      Height:        Constitutional: Alert and awake, oriented x3, not in any acute distress. Eyes: EOMI, irises appear normal, anicteric sclera,  ENMT: external ears and nose appear normal, normal hearing, Lips appear normal Neck: neck appears normal, no masses, normal ROM, no thyromegaly, no JVD  CVS: S1-S2 clear, no murmur rubs or gallops, no LE edema, normal pedal pulses  Respiratory:  clear to auscultation bilaterally, no wheezing, rales or rhonchi. Respiratory effort normal. No accessory muscle  use.  Abdomen: soft nontender, nondistended, normal bowel sounds, no hepatosplenomegaly, no hernias  Musculoskeletal: : no cyanosis, clubbing or edema noted bilaterally Neuro: Cranial nerves II-XII intact, strength, sensation, reflexes Psych: judgement and insight appear normal, stable mood and affect, mental status Skin: no rashes or lesions or ulcers, no induration or nodules    Data reviewed:  I have personally reviewed the recent labs and imaging studies  Pertinent Labs:   K+ 3.3 Glucose 190 Hgb 9.2   Inpatient Medications:   Scheduled Meds: . acetaminophen  650 mg Oral Q6H  . carvedilol  6.25 mg Oral BID WC  . enoxaparin (LOVENOX) injection  40 mg Subcutaneous Q24H  . feeding supplement  237 mL Oral BID BM  . furosemide  20 mg Oral Daily  . gabapentin  300 mg Oral BID  . insulin aspart  0-15 Units Subcutaneous TID WC  . insulin aspart  0-5 Units Subcutaneous QHS  . insulin glargine  10 Units Subcutaneous QHS  . isosorbide mononitrate  30 mg Oral Daily  . lisinopril  10 mg Oral Daily  . metFORMIN  1,000 mg Oral BID WC   Continuous Infusions: . dextrose 5 % and 0.45% NaCl 75 mL/hr at 06/05/18 0600  . methocarbamol (ROBAXIN) IV Stopped (06/03/18 2128)     Radiological Exams on Admission: No results found.  Impression/Recommendations Active Problems:   Diverticulitis large intestine   Accelerated HTN   DM  Diverticulitis -s/p colostomy reversal -Continuing to have pain -Advancing diet -Management as per surgery  Accelerated HTN -patient with very poorly controlled BP as inpatient -While there is a possibility that this is HTN emergency given his concurrent headache, it seems more likely that he has accelerated HTN -Will treat headache with pain medication and Robaxin  -Will treat HTN with home medications - lisinopril, Coreg -Hydralazine was discontinued by surgery, but this is not a poor choice for prn use - and may be better than Clonidine -For now, his  BP is markedly improved and so will leave with current medications at this time -If ongoing headache, consider head CT  DM -Baseline control is suboptimal, with A1c 8.1 -Glucophage has been continued - this is usually held on the inpatient side due to possible need for contrast -However, if no anticipated need for contrast, it is not wrong to continue it -He has baseline SSI -Consider adding 5 units Lantus for improved control, for a total of 15 units daily    Thank you for this consultation.  Our [facility] hospitalist team will follow the patient with you.   Time Spent: 50 minutes  Jonah Blue M.D. [facility] 06/05/2018, 8:11 AM

## 2018-06-05 NOTE — Care Management Note (Signed)
Case Management Note  Patient Details  Name: RADWAN COWLEY MRN: [id number] Date of Birth: 1965-11-15  Subjective/Objective:                    Action/Plan: Wife requesting home health RN. WIll need MD order with wound care instructions and face to face. Patient has had [facility] in past.   Wife and patient will need to learn wound care, [facility] will not make daily visits.   Expected Discharge Date:                  Expected Discharge Plan:     In-House Referral:     Discharge planning Services  CM Consult  Post Acute Care Choice:    Choice offered to:     DME Arranged:  N/A DME Agency:     HH Arranged:    HH Agency:     Status of Service:  In process, will continue to follow  If discussed at Long Length of Stay Meetings, dates discussed:    Additional Comments:  Kingsley Plan, RN 06/05/2018, 2:48 PM

## 2018-06-05 NOTE — Progress Notes (Addendum)
Inpatient Diabetes Program Recommendations  AACE/ADA: New Consensus Statement on Inpatient Glycemic Control (2015)  Target Ranges:  Prepandial:   less than 140 mg/dL      Peak postprandial:   less than 180 mg/dL (1-2 hours)      Critically ill patients:  140 - 180 mg/dL   Lab Results  Component Value Date   GLUCAP 170 (H) 06/05/2018   HGBA1C 10.6 (H) 05/29/2018    Review of Glycemic Control Results for Kenneth Rios, Kenneth Rios (MRN [id number]) as of 06/05/2018 13:14  Ref. Range 06/04/2018 13:12 06/04/2018 16:52 06/04/2018 20:32 06/05/2018 08:36 06/05/2018 12:59  Glucose-Capillary Latest Ref Range: 70 - 99 mg/dL 119 (H) 147 (H) 829 (H) 186 (H) 170 (H)  Diabetes history: DM 2 Outpatient Diabetes medications: Metformin 1000 mg BID Current orders for Inpatient glycemic control:  Novolog moderate tid with meals and HS Lantus 10 units q HS  Inpatient Diabetes Program Recommendations:   Please consider increasing Lantus to 15 units q HS.  Diabetes coordinator spoke with patient and wife last week regarding potential for d/c on insulin.   Thanks,  Beryl Meager, RN, BC-ADM Inpatient Diabetes Coordinator Pager [phone] (8a-5p)

## 2018-06-05 NOTE — Progress Notes (Addendum)
5 Days Post-Op   Subjective/Chief Complaint: C/o ongoing L periorbital HA that radiates to back of head and to neck. HA been present for a few days.  Having soft stools Having ongoing abd discomfort No burping/belching Ambulated twice   Objective: Vital signs in last 24 hours: Temp:  [98.2 F (36.8 C)-98.6 F (37 C)] 98.2 F (36.8 C) (10/28 0414) Pulse Rate:  [78-89] 78 (10/28 0414) Resp:  [18] 18 (10/28 0414) BP: (150-183)/(86-103) 183/103 (10/28 0414) SpO2:  [99 %-100 %] 100 % (10/28 0414) Last BM Date: 06/04/18  Intake/Output from previous day: 10/27 0701 - 10/28 0700 In: 2378.8 [P.O.:600; I.V.:1778.8] Out: -  Intake/Output this shift: No intake/output data recorded.  Nontoxic, alert, resting comfortably cta Reg Soft, min distension, dressing c/d/i; mild approp TTP No edema  Lab Results:  Recent Labs    06/02/18 1220 06/03/18 0404  WBC 6.6 6.3  HGB 8.7* 8.4*  HCT 25.3* 24.6*  PLT 231 230   BMET Recent Labs    06/02/18 1220 06/03/18 0404  NA 138 139  K 3.6 3.5  CL 107 105  CO2 27 27  GLUCOSE 235* 165*  BUN 8 8  CREATININE 0.93 0.91  CALCIUM 8.0* 8.3*   PT/INR No results for input(s): LABPROT, INR in the last 72 hours. ABG No results for input(s): PHART, HCO3 in the last 72 hours.  Invalid input(s): PCO2, PO2  Studies/Results: No results found.  Anti-infectives: Anti-infectives (From admission, onward)   Start     Dose/Rate Route Frequency Ordered Stop   05/31/18 0930  cefoTEtan in Dextrose 5% (CEFOTAN) IVPB 2 g     2 g 100 mL/hr over 30 Minutes Intravenous On call to O.R. 05/31/18 0924 05/31/18 1235      Assessment/Plan: s/p Procedure(s): LAPAROSCOPIC COLOSTOMY REVERSAL COLORECTAL ANASTOMOSIS ERAS PATHWAY (N/A) - Dr Sheliah Hatch   -GI - cont soft diet; told pt that he will have varying types of BMs over the next 6 weeks or so -FEN - KVO. Check lytes. Cont soft diet -HTN - persistent hypertension, added back his lisinopril and lasix  today.  The prn hydralazine may be causing his HA and/or his HTN.  Will stop hydralazine. Add prn clonidine.  -DM - blood sugars a little better. Cont SSI + lantus. Wasn't on lantus at home.  -ambulate, pulm toilet -cont chemical vte prophylaxsis -Pain control - on scheduled tylenol & gabapentin; prn toradol, oxycodone, muscle relaxer -HA - see above discussion, add imitrex prn  Will consult [facility] to assist with BP and Diabetes mgmt.    I'm post call today so the [facility] ([facility]) will cover the pt the rest of the day  [name]. Andrey Campanile, MD, FACS General, Bariatric, & Minimally Invasive Surgery [facility], [place]     LOS: 5 days    [name] 06/05/2018

## 2018-06-05 NOTE — Progress Notes (Signed)
   06/05/18 0750  Vitals  Temp 98.5 F (36.9 C)  Temp Source Oral  BP (!) 220/160 (MD Wilson notified)  MAP (mmHg) 175  BP Location Right Arm  BP Method Automatic  Patient Position (if appropriate) Lying  Pulse Rate 90  Pulse Rate Source Dinamap  Resp 16  Oxygen Therapy  SpO2 100 %  O2 Device Room Air  Pain Assessment  Pain Scale 0-10  Pain Score 10  Pain Type Surgical pain  Pain Location Abdomen  Pain Orientation Left  Pain Descriptors / Indicators Aching  Pain Frequency Intermittent  Pain Onset Gradual  Patients Stated Pain Goal 0  Pain Intervention(s) Medication (See eMAR)  Multiple Pain Sites Yes  2nd Pain Site  Pain Score 10  Pain Type Acute pain  Pain Location Head  Pain Orientation Left (Pt states from the left eye to the back of the neck)  Pain Radiating Towards Neck  Pain Descriptors / Indicators Aching;Throbbing  Pain Frequency Constant  Pain Onset On-going  Patient's Stated Pain Goal 0  Pain Intervention(s) Medication (See eMAR)

## 2018-06-06 ENCOUNTER — Inpatient Hospital Stay (HOSPITAL_COMMUNITY): Payer: Medicaid Other

## 2018-06-06 ENCOUNTER — Encounter (HOSPITAL_COMMUNITY): Payer: Self-pay | Admitting: Radiology

## 2018-06-06 DIAGNOSIS — K5720 Diverticulitis of large intestine with perforation and abscess without bleeding: Secondary | ICD-10-CM

## 2018-06-06 LAB — GLUCOSE, CAPILLARY
GLUCOSE-CAPILLARY: 150 mg/dL — AB (ref 70–99)
GLUCOSE-CAPILLARY: 187 mg/dL — AB (ref 70–99)
GLUCOSE-CAPILLARY: 101 mg/dL — AB (ref 70–99)
Glucose-Capillary: 160 mg/dL — ABNORMAL HIGH (ref 70–99)

## 2018-06-06 MED ORDER — POLYETHYLENE GLYCOL 3350 17 G PO PACK
17.0000 g | PACK | Freq: Once | ORAL | Status: AC
  Administered 2018-06-06: 17 g via ORAL
  Filled 2018-06-06: qty 1

## 2018-06-06 MED ORDER — HYDRALAZINE HCL 20 MG/ML IJ SOLN
10.0000 mg | Freq: Four times a day (QID) | INTRAMUSCULAR | Status: DC | PRN
  Administered 2018-06-07: 10 mg via INTRAVENOUS
  Filled 2018-06-06: qty 1

## 2018-06-06 MED ORDER — AMLODIPINE BESYLATE 10 MG PO TABS
10.0000 mg | ORAL_TABLET | Freq: Every day | ORAL | Status: DC
  Administered 2018-06-06 – 2018-06-07 (×2): 10 mg via ORAL
  Filled 2018-06-06 (×2): qty 1

## 2018-06-06 MED ORDER — CLONIDINE HCL 0.2 MG PO TABS
0.2000 mg | ORAL_TABLET | Freq: Once | ORAL | Status: AC
  Administered 2018-06-06: 0.2 mg via ORAL

## 2018-06-06 MED ORDER — METHOCARBAMOL 1000 MG/10ML IJ SOLN
500.0000 mg | Freq: Three times a day (TID) | INTRAVENOUS | Status: DC
  Administered 2018-06-06 – 2018-06-07 (×3): 500 mg via INTRAVENOUS
  Filled 2018-06-06 (×6): qty 5

## 2018-06-06 MED ORDER — KETOROLAC TROMETHAMINE 30 MG/ML IJ SOLN
30.0000 mg | Freq: Once | INTRAMUSCULAR | Status: AC
  Administered 2018-06-06: 30 mg via INTRAVENOUS

## 2018-06-06 NOTE — Progress Notes (Addendum)
Inpatient Diabetes Program Recommendations  AACE/ADA: New Consensus Statement on Inpatient Glycemic Control (2015)  Target Ranges:  Prepandial:   less than 140 mg/dL      Peak postprandial:   less than 180 mg/dL (1-2 hours)      Critically ill patients:  140 - 180 mg/dL   Results for Kenneth Rios, Kenneth Rios (MRN [id number]) as of 06/06/2018 14:20  Ref. Range 06/06/2018 08:06 06/06/2018 13:35  Glucose-Capillary Latest Ref Range: 70 - 99 mg/dL 865 (H) 784 (H)   Review of Glycemic Control  Diabetes history: DM 2 Outpatient Diabetes medications: Metformin 1000 mg BID Current orders for Inpatient glycemic control: Lantus 15 units, Metformin 1000 mg BID, Novolog 0-15 units tid, Novolog 0-5 units qhs  Inpatient Diabetes Program Recommendations:    Spoke with patient again about glucose management at home. Spoke to patient about his glucose trends being at goal. Patient reports he "thinks he does not need the insulin at d/c." Told patient the insulin is what is keeping his glucose down at the goal range and for now he needs the insulin for wound healing. Patient agrees. Patient wants me to return in the am and show wife one more time how to operate the insulin pen.  For discharge: Lantus Solostar insulin pen (order # [id number]) covered by insurance Insulin pen needles (order # [phone]) If patient does not have glucose meter may need another (order # [id number])  Thanks,  Christena Deem RN, MSN, BC-ADM Inpatient Diabetes Coordinator Team Pager [phone] (8a-5p)

## 2018-06-06 NOTE — Plan of Care (Signed)
  Problem: Pain Managment: Goal: General experience of comfort will improve Outcome: Progressing   

## 2018-06-06 NOTE — Progress Notes (Addendum)
6 Days Post-Op   Subjective/Chief Complaint: Still with L periorbital HA radiating to back of head/neck. No visual changes/autonomic changes Did get some relief from HA after imitrex dosage yesterday around 1 No burping/belching.  Some flatus, BM today Still with abd soreness Still with fairly high BP at times. Most recent one improved BS improved  Objective: Vital signs in last 24 hours: Temp:  [98.3 F (36.8 C)-98.7 F (37.1 C)] 98.3 F (36.8 C) (10/29 1240) Pulse Rate:  [81-87] 84 (10/29 1240) Resp:  [18-20] 20 (10/29 1240) BP: (144-186)/(88-112) 144/88 (10/29 1240) SpO2:  [99 %-100 %] 100 % (10/29 1240) Last BM Date: 06/06/18  Intake/Output from previous day: 10/28 0701 - 10/29 0700 In: 1608.7 [P.O.:1140; I.V.:468.7] Out: -  Intake/Output this shift: No intake/output data recorded.  Alert, sitting in chair PERL cta Reg Soft, obese,some bloating, approp TTP; some serous drainage from infraumbilical small incision; old ostomy site ok No edema  Lab Results:  Recent Labs    06/05/18 0820  WBC 5.8  HGB 9.2*  HCT 27.2*  PLT 331   BMET Recent Labs    06/05/18 0806  NA 137  K 3.3*  CL 105  CO2 26  GLUCOSE 190*  BUN 6  CREATININE 0.81  CALCIUM 8.8*   PT/INR No results for input(s): LABPROT, INR in the last 72 hours. ABG No results for input(s): PHART, HCO3 in the last 72 hours.  Invalid input(s): PCO2, PO2  Studies/Results: No results found.  Anti-infectives: Anti-infectives (From admission, onward)   Start     Dose/Rate Route Frequency Ordered Stop   05/31/18 0930  cefoTEtan in Dextrose 5% (CEFOTAN) IVPB 2 g     2 g 100 mL/hr over 30 Minutes Intravenous On call to O.R. 05/31/18 0924 05/31/18 1235      Assessment/Plan: s/p Procedure(s): LAPAROSCOPIC COLOSTOMY REVERSAL COLORECTAL ANASTOMOSIS ERAS PATHWAY (N/A) by DR Sheliah Hatch  -GI - cont soft diet; told pt that he will have varying types of BMs over the next 6 weeks or so; also told pt he  prob won't tolerate everything on tray. Encourage small portions throughout day; will give 1 dose miralax  -FEN - KVO. Marland Kitchen Cont soft diet  -HTN - persistent hypertension, added back his lisinopril and lasix yesterday.  The prn hydralazine may be causing his HA and/or his HTN.  Will stop hydralazine. cont prn clonidine. BP better at noon. Discussion with pt and fiance via phone that I surmise his BP has been an issue even prior to hospitalization and that it will take some to see consistent improvement which will probably have to happen as outpt. Discussed need for him to f/[facility] with [facility]   -DM - blood sugars much  better. Cont SSI + lantus. Wasn't on lantus at home.  -ambulate, pulm toilet -cont chemical vte prophylaxsis -Pain control - on scheduled tylenol & gabapentin; prn toradol, oxycodone; change muscle relaxer to scheduled.  -HA - see above discussion, cont imitrex prn; check head ct  Will arrange [facility] Will also ask DM coordinator or [facility] what needles/etc to Rx on discharge for the lantus since this is unfamiliar to me.   Anticipate dc Wednesday  Appreciate [facility] and DM coordinator assist  [name]. Andrey Campanile, MD, FACS General, Bariatric, & Minimally Invasive Surgery [facility], [place]   LOS: 6 days    [name] 06/06/2018

## 2018-06-06 NOTE — Progress Notes (Signed)
Patient Demographics:    Kenneth Rios, is a 52 y.o. male, DOB - October 11, 1965, ZOX:[id number]  Admit date - 05/31/2018   Admitting Physician Jonah Blue, MD  Outpatient Primary MD for the patient is Hoy Register, MD  LOS - 6   No chief complaint on file.       Subjective:    Kenneth Rios today has no fevers, no emesis,  No chest pain, s/o at bedside  , patient complains of dizziness headaches systolic blood pressure was over 200, with diastolic blood pressure over 160, patient and significant other requesting CT head  Assessment  & Plan :    Principal Problem:   Diverticulitis large intestine Active Problems:   Diabetes mellitus without complication (HCC)   Accelerated hypertension  Brief summary  52 yo male with HTN; HLD; DM, obesity and recent episode of acute diverticulitis with subsequent Hartman's colectomy/Colostomy. He has healed  readmitted on 05/31/2018 to the surgical service for colostomy reversal, [facility] hospitalist is consulted for management of persistently elevated blood pressure and blood sugars on 06/05/2018   Plan:-   1) persistent headaches--- associated with dizziness and nausea, in retrospect it seems patient's headache started when he was started on isosorbide by 6 or 7 weeks ago, patient complains of dizziness headaches systolic blood pressure was over 200, with diastolic blood pressure over 160, patient and significant other requesting CT head.  CT head without contrast pending, we need to get better control of blood pressure without nitrates.  Anticipate headaches may improve with improvement in patient's blood pressures.  May use Imitrex as needed  2) stage II hypertension--- initially uncontrolled,  SBP was over 200 and diastolic BP over 811, avoid nitrates due to concerns about headaches, give amlodipine 10 mg daily, Coreg 6.25 mg twice daily, clonidine 0.2 mg every 8  hours as needed elevated BP, IV hydralazine as needed elevated BP, continue lisinopril 10 mg daily..... With the above medication changes on 06/06/2018 at the end of the day .. BP control was already improving  3)DM2-uncontrolled with A1c of 10.6, continue metformin 1 g twice daily, diabetic educator input appreciated, start Lantus 15 units nightly,, patient may restart Jardiance post discharge home.  Diabetic diet and lifestyle changes discussed  4) colostomy reversal---- on 05/31/18 patient underwent ... laparoscopic lysis of adhesions, laparoscopic take down of end colostomy with colorectal anastomosis--- further management per general surgery team  Thank you for this consult hospitalist medical team will follow patient concurrently with you to help manage his diabetes and hypertension  Code Status : Full  Disposition Plan  : per Primary Team (Gen Surg)  DVT Prophylaxis  :  Lovenox   Lab Results  Component Value Date   PLT 331 06/05/2018    Inpatient Medications  Scheduled Meds: . acetaminophen  650 mg Oral Q6H  . amLODipine  10 mg Oral Daily  . carvedilol  6.25 mg Oral BID WC  . enoxaparin (LOVENOX) injection  40 mg Subcutaneous Q24H  . feeding supplement  237 mL Oral BID BM  . furosemide  20 mg Oral Daily  . gabapentin  300 mg Oral BID  . insulin aspart  0-15 Units Subcutaneous TID WC  . insulin aspart  0-5 Units Subcutaneous QHS  . insulin  glargine  15 Units Subcutaneous QHS  . lisinopril  10 mg Oral Daily  . metFORMIN  1,000 mg Oral BID WC   Continuous Infusions: . dextrose 5 % and 0.45% NaCl 10 mL/hr at 06/05/18 1800  . methocarbamol (ROBAXIN) IV 500 mg (06/06/18 1430)   PRN Meds:.albuterol, cloNIDine, hydrALAZINE, HYDROmorphone (DILAUDID) injection, ketorolac, nitroGLYCERIN, ondansetron **OR** ondansetron (ZOFRAN) IV, oxyCODONE, SUMAtriptan    Anti-infectives (From admission, onward)   Start     Dose/Rate Route Frequency Ordered Stop   05/31/18 0930  cefoTEtan in  Dextrose 5% (CEFOTAN) IVPB 2 g     2 g 100 mL/hr over 30 Minutes Intravenous On call to O.R. 05/31/18 0924 05/31/18 1235        Objective:   Vitals:   06/05/18 2003 06/06/18 0539 06/06/18 0727 06/06/18 1240  BP: (!) 174/99 (!) 186/111 (!) 176/112 (!) 144/88  Pulse: 81 87  84  Resp: 18   20  Temp: 98.7 F (37.1 C)   98.3 F (36.8 C)  TempSrc: Oral   Oral  SpO2: 99%   100%  Weight:      Height:        Wt Readings from Last 3 Encounters:  05/31/18 99.8 kg  05/29/18 97.1 kg  04/18/18 94.8 kg     Intake/Output Summary (Last 24 hours) at 06/06/2018 1827 Last data filed at 06/06/2018 0700 Gross per 24 hour  Intake 106.32 ml  Output -  Net 106.32 ml     Physical Exam Patient is examined daily including today on 06/06/18 , exams remain the same as of yesterday except that has changed   Gen:- Awake Alert,  In no apparent distress  HEENT:- [place].AT, No sclera icterus Neck-Supple Neck,No JVD,.  Lungs-  CTAB , fairly symmetrical air movement CV- S1, S2 normal, regular Abd-  +ve B.Sounds, Abd Soft, appropriate postop tenderness, postop wound with dressings,    Extremity/Skin:- No  edema,   good pulses Psych-affect is appropriate, oriented x3 Neuro-no new focal deficits, no tremors   Data Review:   Micro Results No results found for this or any previous visit (from the past 240 hour(s)).  Radiology Reports No results found.   CBC Recent Labs  Lab 06/01/18 0832 06/02/18 0735 06/02/18 1220 06/03/18 0404 06/05/18 0820  WBC 8.5 8.9 6.6 6.3 5.8  HGB 11.0* 9.9* 8.7* 8.4* 9.2*  HCT 32.5* 28.9* 25.3* 24.6* 27.2*  PLT 291 286 231 230 331  MCV 80.8 82.3 81.6 81.7 81.2  MCH 27.4 28.2 28.1 27.9 27.5  MCHC 33.8 34.3 34.4 34.1 33.8  RDW 12.1 12.6 12.6 12.5 12.5  LYMPHSABS 1.9 3.4  --   --  2.4  MONOABS 0.5 0.5  --   --  0.4  EOSABS 0.0 0.4  --   --  0.4  BASOSABS 0.0 0.0  --   --  0.0    Chemistries  Recent Labs  Lab 06/01/18 0832 06/02/18 0735 06/02/18 1220  06/03/18 0404 06/05/18 0806  NA 133* 137 138 139 137  K 3.8 3.9 3.6 3.5 3.3*  CL 103 106 107 105 105  CO2 25 26 27 27 26   GLUCOSE 223* 254* 235* 165* 190*  BUN 9 9 8 8 6   CREATININE 1.09 1.07 0.93 0.91 0.81  CALCIUM 7.9* 8.2* 8.0* 8.3* 8.8*  MG  --   --   --   --  1.7   ------------------------------------------------------------------------------------------------------------------ No results for input(s): CHOL, HDL, LDLCALC, TRIG, CHOLHDL, LDLDIRECT in the last 72 hours.  Lab Results  Component Value Date   HGBA1C 10.6 (H) 05/29/2018   ------------------------------------------------------------------------------------------------------------------ No results for input(s): TSH, T4TOTAL, T3FREE, THYROIDAB in the last 72 hours.  Invalid input(s): FREET3 ------------------------------------------------------------------------------------------------------------------ No results for input(s): VITAMINB12, FOLATE, FERRITIN, TIBC, IRON, RETICCTPCT in the last 72 hours.  Coagulation profile No results for input(s): INR, PROTIME in the last 168 hours.  No results for input(s): DDIMER in the last 72 hours.  Cardiac Enzymes No results for input(s): CKMB, TROPONINI, MYOGLOBIN in the last 168 hours.  Invalid input(s): CK ------------------------------------------------------------------------------------------------------------------    Component Value Date/Time   BNP 57.3 08/28/2017 0818     Kimsey Demaree M.D on 06/06/2018 at 6:27 PM  Pager---[phone] Go to [email or web address] - password [email or web address] for contact info  [facility] - Office  [phone]

## 2018-06-07 LAB — GLUCOSE, CAPILLARY
GLUCOSE-CAPILLARY: 134 mg/dL — AB (ref 70–99)
Glucose-Capillary: 145 mg/dL — ABNORMAL HIGH (ref 70–99)

## 2018-06-07 MED ORDER — BLOOD GLUCOSE METER KIT: PACK | 0 refills | Status: DC

## 2018-06-07 MED ORDER — OXYCODONE HCL 5 MG PO TABS: 5.0000 mg | ORAL_TABLET | Freq: Four times a day (QID) | ORAL | 0 refills | Status: DC | PRN

## 2018-06-07 MED ORDER — GABAPENTIN 300 MG PO CAPS: 300.0000 mg | ORAL_CAPSULE | Freq: Two times a day (BID) | ORAL | 0 refills | Status: DC

## 2018-06-07 MED ORDER — LISINOPRIL 20 MG PO TABS: 20.0000 mg | ORAL_TABLET | Freq: Every day | ORAL | 0 refills | Status: DC

## 2018-06-07 MED ORDER — ACETAMINOPHEN 325 MG PO TABS: 650.0000 mg | ORAL_TABLET | Freq: Four times a day (QID) | ORAL | 0 refills | Status: AC

## 2018-06-07 MED ORDER — AMLODIPINE BESYLATE 10 MG PO TABS: 10.0000 mg | ORAL_TABLET | Freq: Every day | ORAL | 0 refills | Status: DC

## 2018-06-07 MED ORDER — INSULIN GLARGINE 100 UNIT/ML SOLOSTAR PEN: 15.0000 [IU] | PEN_INJECTOR | Freq: Every day | SUBCUTANEOUS | 0 refills | Status: DC

## 2018-06-07 MED ORDER — INSULIN PEN NEEDLE 31G X 5 MM MISC: 0 refills | Status: DC

## 2018-06-07 MED FILL — GABAPENTIN 300 MG CAPSULE: 300 | 30 days supply | Qty: 60 | Fill #0

## 2018-06-07 MED FILL — UNIFINE PENTIPS 31GX3/16": 31G X 5 MM | 34 days supply | Qty: 100 | Fill #0

## 2018-06-07 MED FILL — oxyCODONE HCL 5 MG TABS: 5 | 7 days supply | Qty: 28 | Fill #0

## 2018-06-07 MED FILL — UNIFINE PENTIPS 31GX3/16: 31G X 5 MM | 34 days supply | Qty: 100 | Fill #0

## 2018-06-07 MED FILL — LISINOPRIL 20 MG TABLET: 20 | 30 days supply | Qty: 30 | Fill #0

## 2018-06-07 MED FILL — AMLODIPINE BESYLATE 10 MG T: 10 | 30 days supply | Qty: 30 | Fill #0

## 2018-06-07 MED FILL — LANTUS SOLOSTAR 100 UNITS/M: 100 | 20 days supply | Qty: 3 | Fill #0

## 2018-06-07 NOTE — Discharge Instructions (Signed)
[facility]  POST OP INSTRUCTIONS Always review your discharge instruction sheet given to you by the facility where your surgery was performed. IF YOU HAVE DISABILITY OR FAMILY LEAVE FORMS, YOU MUST BRING THEM TO THE OFFICE FOR PROCESSING.   DO NOT GIVE THEM TO YOUR DOCTOR.  PAIN CONTROL  1. SEE MANAGING YOUR PAIN WITHOUT OPIOIDS HANDOUT BELOW  HOME MEDICATIONS 2. Take your usually prescribed medications unless otherwise directed.  DIET 3. You should follow a light diet the first few days after arrival home.  Be sure to include lots of fluids daily. Avoid fatty, fried foods. If you do not feel like eating a solid meal then drink a meal replacement shake like glucerna.  You will probably tolerate more small & frequent meals than large meals over the next few weeks.   CONSTIPATION 4. It is common to experience some constipation after surgery and if you are taking pain medication.  Increasing fluid intake and taking a stool softener (such as Colace) will usually help or prevent this problem from occurring.  A mild laxative (Milk of Magnesia or Miralax) should be taken according to package instructions if there are no bowel movements after 48 hours.  WOUND/INCISION CARE 5. Most patients will experience some swelling and bruising in the area of the incisions.  Ice packs will help.  Swelling and bruising can take several days to resolve.  6. Unless discharge instructions indicate otherwise, follow guidelines below  a. STERI-STRIPS - you may remove your outer bandages 48 hours after surgery, and you may shower at that time.  You have steri-strips (small skin tapes) in place directly over the incision.  These strips should be left on the skin for 7-10 days.   b. DERMABOND/SKIN GLUE - you may shower in 24 hours.  The glue will flake off over the next 2-3 weeks. 7. Any sutures or staples will be removed at the office during your follow-up visit.  ACTIVITIES 8. You may resume  regular (light) daily activities beginning the next day--such as daily self-care, walking, climbing stairs--gradually increasing activities as tolerated.  You may have sexual intercourse when it is comfortable.  Refrain from any heavy lifting or straining until approved by your doctor. a. You may drive when you are no longer taking prescription pain medication, you can comfortably wear a seatbelt, and you can safely maneuver your car and apply brakes.  FOLLOW-UP 9. You should see your doctor in the office for a follow-up appointment approximately 2-3 weeks after your surgery.  You should have been given your post-op/follow-up appointment when your surgery was scheduled.  If you did not receive a post-op/follow-up appointment, make sure that you call for this appointment within a day or two after you arrive home to insure a convenient appointment time.  OTHER INSTRUCTIONS 10. WOUND CARE - OPEN WOUND - moisten a small gauze and pack into Left abdominal wound with a qtip. Cover with dry gauze and tape. Change daily.  The wound can get wet in shower. It does not have be covered when showering. The wound will get smaller and shallower as you continue to heal from surgery.   WHEN TO CALL YOUR DOCTOR: 1. Fever over 101.0 2. Inability to urinate 3. Continued bleeding from incision. 4. Increased pain, redness, or drainage from the incision. 5. Increasing abdominal pain  The clinic staff is available to answer your questions during regular business hours.  Please dont hesitate to call and ask to speak to one of  the nurses for clinical concerns.  If you have a medical emergency, go to the nearest emergency room or call 911.  A surgeon from [facility] is always on call at the hospital. [address], [address], [place], [place]  [address] ? [address] [address], [place], [place]   [address] [phone] ? [phone] ? FAX [phone] Web site:  [email or web address]       Managing Your Pain After Surgery Without Opioids    Thank you for participating in our program to help patients manage their pain after surgery without opioids. This is part of our effort to provide you with the best care possible, without exposing you or your family to the risk that opioids pose.  What pain can I expect after surgery? You can expect to have some pain after surgery. This is normal. The pain is typically worse the day after surgery, and quickly begins to get better. Many studies have found that many patients are able to manage their pain after surgery with Over-the-Counter (OTC) medications such as Tylenol and Motrin. If you have a condition that does not allow you to take Tylenol or Motrin, notify your surgical team.  How will I manage my pain? The best strategy for controlling your pain after surgery is around the clock pain control with Tylenol (acetaminophen) and Motrin (ibuprofen or Advil). Alternating these medications with each other allows you to maximize your pain control. In addition to Tylenol and Motrin, you can use heating pads or ice packs on your incisions to help reduce your pain.  How will I alternate your regular strength over-the-counter pain medication? You will take a dose of pain medication every three hours. ; Start by taking 650 mg of Tylenol (2 pills of 325 mg) ; 3 hours later take 600 mg of Motrin (3 pills of 200 mg) ; 3 hours after taking the Motrin take 650 mg of Tylenol ; 3 hours after that take 600 mg of Motrin.   - 1 -  See example - if your first dose of Tylenol is at 12:00 PM   12:00 PM Tylenol 650 mg (2 pills of 325 mg)  3:00 PM Motrin 600 mg (3 pills of 200 mg)  6:00 PM Tylenol 650 mg (2 pills of 325 mg)  9:00 PM Motrin 600 mg (3 pills of 200 mg)  Continue alternating every 3 hours   We recommend that you follow this schedule around-the-clock for at least 3 days after surgery, or  until you feel that it is no longer needed. Use the table on the last page of this handout to keep track of the medications you are taking. Important: Do not take more than 3000mg  of Tylenol or 3200mg  of Motrin in a 24-hour period. Do not take ibuprofen/Motrin if you have a history of bleeding stomach ulcers, severe kidney disease, &/or actively taking a blood thinner  What if I still have pain? If you have pain that is not controlled with the over-the-counter pain medications (Tylenol and Motrin or Advil) you might have what we call breakthrough pain. You will receive a prescription for a small amount of an opioid pain medication such as Oxycodone, Tramadol, or Tylenol with Codeine. Use these opioid pills in the first 24 hours after surgery if you have breakthrough pain. Do not take more than 1 pill every 4-6 hours.  If you still have uncontrolled pain after using all opioid pills, don't hesitate to call our staff using the number provided. We will  help make sure you are managing your pain in the best way possible, and if necessary, we can provide a prescription for additional pain medication.   Day 1    Time  Name of Medication Number of pills taken  Amount of Acetaminophen  Pain Level   Comments  AM PM       AM PM       AM PM       AM PM       AM PM       AM PM       AM PM       AM PM       Total Daily amount of Acetaminophen Do not take more than  3,000 mg per day      Day 2    Time  Name of Medication Number of pills taken  Amount of Acetaminophen  Pain Level   Comments  AM PM       AM PM       AM PM       AM PM       AM PM       AM PM       AM PM       AM PM       Total Daily amount of Acetaminophen Do not take more than  3,000 mg per day      Day 3    Time  Name of Medication Number of pills taken  Amount of Acetaminophen  Pain Level   Comments  AM PM       AM PM       AM PM       AM PM          AM PM       AM PM       AM PM       AM PM        Total Daily amount of Acetaminophen Do not take more than  3,000 mg per day      Day 4    Time  Name of Medication Number of pills taken  Amount of Acetaminophen  Pain Level   Comments  AM PM       AM PM       AM PM       AM PM       AM PM       AM PM       AM PM       AM PM       Total Daily amount of Acetaminophen Do not take more than  3,000 mg per day      Day 5    Time  Name of Medication Number of pills taken  Amount of Acetaminophen  Pain Level   Comments  AM PM       AM PM       AM PM       AM PM       AM PM       AM PM       AM PM       AM PM       Total Daily amount of Acetaminophen Do not take more than  3,000 mg per day       Day 6    Time  Name of Medication Number of pills taken  Amount of Acetaminophen  Pain Level  Comments  AM PM       AM PM       AM PM       AM PM       AM PM       AM PM       AM PM       AM PM       Total Daily amount of Acetaminophen Do not take more than  3,000 mg per day      Day 7    Time  Name of Medication Number of pills taken  Amount of Acetaminophen  Pain Level   Comments  AM PM       AM PM       AM PM       AM PM       AM PM       AM PM       AM PM       AM PM       Total Daily amount of Acetaminophen Do not take more than  3,000 mg per day        For additional information about how and where to safely dispose of unused opioid medications - [email or web address]  Disclaimer: This document contains information and/or instructional materials adapted from [facility] for the typical patient with your condition. It does not replace medical advice from your health care provider because your experience may differ from that of the typical patient. Talk to your health care provider if you have any questions about this document, your condition or your treatment plan. Adapted from [facility]

## 2018-06-07 NOTE — Care Management Note (Signed)
Case Management Note  Patient Details  Name: Kenneth Rios MRN: [id number] Date of Birth: 09-Aug-1966  Subjective/Objective:                    Action/Plan:   Expected Discharge Date:                  Expected Discharge Plan:  Home w Home Health Services  In-House Referral:  NA  Discharge planning Services  CM Consult  Post Acute Care Choice:  Home Health Choice offered to:  Patient, Spouse  DME Arranged:  N/A DME Agency:  NA  HH Arranged:  RN HH Agency:  [facility]  Status of Service:  Completed, signed off  If discussed at Long Length of Stay Meetings, dates discussed:    Additional Comments:  Kingsley Plan, RN 06/07/2018, 9:49 AM

## 2018-06-07 NOTE — Progress Notes (Signed)
Inpatient Diabetes Program Recommendations  AACE/ADA: New Consensus Statement on Inpatient Glycemic Control (2015)  Target Ranges:  Prepandial:   less than 140 mg/dL      Peak postprandial:   less than 180 mg/dL (1-2 hours)      Critically ill patients:  140 - 180 mg/dL   Educated patient and spouse on insulin pen use at home. Reviewed contents of insulin flexpen starter kit. Reviewed all steps if insulin pen including attachment of needle, 2-unit air shot, dialing up dose, giving injection, removing needle, disposal of sharps, storage of unused insulin, disposal of insulin etc. Patient able to provide successful return demonstration. Also reviewed troubleshooting with insulin pen. MD to give patient Rxs for insulin pens and insulin pen needles.  Thanks,  Tama Headings RN, MSN, BC-ADM Inpatient Diabetes Coordinator Team Pager [phone] (8a-5p)

## 2018-06-07 NOTE — Progress Notes (Signed)
Patient discharged to home with instructions. 

## 2018-06-07 NOTE — Discharge Summary (Signed)
Physician Discharge Summary  Kenneth Rios IOM:[id number] DOB: 1965/10/31 DOA: 05/31/2018  PCP: Kenneth Rakes, MD  Admit date: 05/31/2018 Discharge date: 06/07/2018  Recommendations for Outpatient Follow-up:    Follow-up Information    [name], [name], MD. Go on 06/28/2018.   Specialty:  General Surgery Why:  11:45 AM; PLEASE ARRIVE AT 11:30 AM Contact information: [address] [place] [address] [phone]          Dr Margarita Rana as scheduled on 06/20/2018  Discharge Diagnoses:  1. H/o Hartman's procedure  2. Undesired colostomy, s/p laparoscopic lysis of adhesions, colostomy reversal Dr Kieth Brightly 10.20.19 3. Hypertension 4. Diabetes Mellitus 2  Surgical Procedure: s/p laparoscopic lysis of adhesions, colostomy reversal Dr Kieth Brightly 10.20.19  Discharge Condition: good Disposition: home with home health nursing Consults: [facility] hospitalists, Diabetes Nurse Educator Diet recommendation: diabetic  Filed Weights   05/31/18 0936  Weight: 99.8 kg    History of present illness: 52 yo male with multiple medical problems presented with diverticulitis and underwent Hartman's colectomy. He has healed and is ready for reversal.   Hospital Course:  He underwent above mentioned procedure. Bowel function eventually returned. However his hospital course was prolonged due to pain issues, uncontrolled hypertension, and uncontrolled diabetes.  Diabetes nurse was consulted. Started on lantus and continued on oral agents. He had persistent headache that was difficult to manage despite maximal medical treatment. His HA probably due to uncontrolled BP- BP as high as 200s/100s.  He was continued home meds and [facility] was consulted.  Some medications were changed.  He had a negative head ct.  On day of dc, he was tolerating a diet, his BP was improved, his BS were better and pain was controlled. HHN was arranged to assist with old ostomy site wound healing. Pt and fiance  instructed on wound care. I discussed wound care, diet, and pain control with them. [facility] also felt he was medically stable.   We arranged an earlier appt with his PCP  Told to expect serous/serosang drainage from infraumbilical incision, change gauze daily and/or as needed.   Will ask HHN to remove sutures next week.   BP (!) 164/102 (BP Location: Left Arm)   Pulse 74   Temp 98.2 F (36.8 C) (Oral)   Resp 16   Ht _0  (1.753 m)   Wt 99.8 kg   SpO2 100%   BMI 32.49 kg/m   Gen: alert, NAD, non-toxic appearing Pupils: equal, no scleral icterus Pulm: Lungs clear to auscultation, symmetric chest rise CV: regular rate and rhythm Abd: soft, mild approp tender, nondistended.  No cellulitis. No incisional hernia Ext: no edema, no calf tenderness Skin: no rash, no jaundice    Discharge Instructions  Discharge Instructions    Call MD for:   Complete by:  As directed    Temperature >101   Call MD for:  hives   Complete by:  As directed    Call MD for:  persistant dizziness or light-headedness   Complete by:  As directed    Call MD for:  persistant nausea and vomiting   Complete by:  As directed    Call MD for:  redness, tenderness, or signs of infection (pain, swelling, redness, odor or green/yellow discharge around incision site)   Complete by:  As directed    Call MD for:  severe uncontrolled pain   Complete by:  As directed    Diet Carb Modified   Complete by:  As directed  Discharge instructions   Complete by:  As directed    See [facility] discharge instructions   Increase activity slowly   Complete by:  As directed      Allergies as of 06/07/2018   No Known Allergies     Medication List    STOP taking these medications   albuterol 108 (90 Base) MCG/ACT inhaler Commonly known as:  PROVENTIL HFA;VENTOLIN HFA   empagliflozin 10 MG Tabs tablet Commonly known as:  JARDIANCE   isosorbide mononitrate 30 MG 24 hr tablet Commonly known as:  IMDUR   metroNIDAZOLE  500 MG tablet Commonly known as:  FLAGYL   neomycin 500 MG tablet Commonly known as:  MYCIFRADIN   polyethylene glycol packet Commonly known as:  MIRALAX / GLYCOLAX     TAKE these medications   acetaminophen 325 MG tablet Commonly known as:  TYLENOL Take 2 tablets (650 mg total) by mouth every 6 (six) hours for 7 days.   amLODipine 10 MG tablet Commonly known as:  NORVASC Take 1 tablet (10 mg total) by mouth daily. Start taking on:  06/08/2018   aspirin EC 81 MG tablet Take 81 mg by mouth daily.   atorvastatin 80 MG tablet Commonly known as:  LIPITOR Take 1 tablet (80 mg total) by mouth daily.   blood glucose meter kit and supplies Dispense based on patient and insurance preference. Use up to four times daily as directed. (FOR ICD-10 E10.9, E11.9).   carvedilol 6.25 MG tablet Commonly known as:  COREG Take 1 tablet (6.25 mg total) by mouth 2 (two) times daily with a meal.   furosemide 20 MG tablet Commonly known as:  LASIX Take 1 tablet (20 mg total) by mouth daily.   gabapentin 300 MG capsule Commonly known as:  NEURONTIN Take 1 capsule (300 mg total) by mouth 2 (two) times daily.   Insulin Glargine 100 UNIT/ML Solostar Pen Commonly known as:  LANTUS Inject 15 Units into the skin daily.   Insulin Pen Needle 31G X 5 MM Misc Inject insulin via insulin pen once daily   lisinopril 20 MG tablet Commonly known as:  PRINIVIL,ZESTRIL Take 1 tablet (20 mg total) by mouth daily. What changed:    medication strength  how much to take   metFORMIN 500 MG tablet Commonly known as:  GLUCOPHAGE Take 2 tablets (1,000 mg total) by mouth 2 (two) times daily with a meal.   nitroGLYCERIN 0.4 MG SL tablet Commonly known as:  NITROSTAT Place 1 tablet (0.4 mg total) under the tongue every 5 (five) minutes as needed for chest pain.   oxyCODONE 5 MG immediate release tablet Commonly known as:  Oxy IR/ROXICODONE Take 1 tablet (5 mg total) by mouth every 6 (six) hours as  needed for breakthrough pain.      Follow-up Information    [name], [name], MD. Go on 06/28/2018.   Specialty:  General Surgery Why:  11:45 AM; PLEASE ARRIVE AT 11:30 AM Contact information: [place] [address] [phone]            The results of significant diagnostics from this hospitalization (including imaging, microbiology, ancillary and laboratory) are listed below for reference.    Significant Diagnostic Studies: Ct Head Wo Contrast  Result Date: 06/06/2018 CLINICAL DATA:  Persistent headache and blurry vision for 8 days. History of hypertension and diabetes. EXAM: CT HEAD WITHOUT CONTRAST TECHNIQUE: Contiguous axial images were obtained from the base of the skull through the vertex without  intravenous contrast. COMPARISON:  None. FINDINGS: BRAIN: No intraparenchymal hemorrhage, mass effect nor midline shift. Minimal patchy supratentorial white matter hypodensities. The ventricles and sulci are normal. No acute large vascular territory infarcts. No abnormal extra-axial fluid collections. Basal cisterns are patent. VASCULAR: Unremarkable. SKULL/SOFT TISSUES: No skull fracture. No significant soft tissue swelling. ORBITS/SINUSES: The included ocular globes and orbital contents are normal.Mild paranasal sinus mucosal thickening. Mastoid air cells are well aerated. OTHER: RIGHT maxillary periapical abscess. IMPRESSION: 1. No acute intracranial process. 2. Minimal chronic small vessel ischemic changes. Electronically Signed   By: Elon Alas M.D.   On: 06/06/2018 19:27    Microbiology: No results found for this or any previous visit (from the past 240 hour(s)).   Labs: Basic Metabolic Panel: Recent Labs  Lab 06/01/18 0832 06/02/18 0735 06/02/18 1220 06/03/18 0404 06/05/18 0806  NA 133* 137 138 139 137  K 3.8 3.9 3.6 3.5 3.3*  CL 103 106 107 105 105  CO2 _0 GLUCOSE 223* 254* 235* 165* 190*  BUN _1 CREATININE 1.09 1.07 0.93 0.91 0.81  CALCIUM 7.9* 8.2* 8.0* 8.3* 8.8*  MG  --   --   --   --  1.7   Liver Function Tests: No results for input(s): AST, ALT, ALKPHOS, BILITOT, PROT, ALBUMIN in the last 168 hours. No results for input(s): LIPASE, AMYLASE in the last 168 hours. No results for input(s): AMMONIA in the last 168 hours. CBC: Recent Labs  Lab 06/01/18 0832 06/02/18 0735 06/02/18 1220 06/03/18 0404 06/05/18 0820  WBC 8.5 8.9 6.6 6.3 5.8  NEUTROABS 6.0 4.5  --   --  2.6  HGB 11.0* 9.9* 8.7* 8.4* 9.2*  HCT 32.5* 28.9* 25.3* 24.6* 27.2*  MCV 80.8 82.3 81.6 81.7 81.2  PLT 291 286 231 230 331   Cardiac Enzymes: No results for input(s): CKTOTAL, CKMB, CKMBINDEX, TROPONINI in the last 168 hours. BNP: BNP (last 3 results) Recent Labs    08/24/17 1416 08/28/17 0818  BNP 340.3* 57.3    ProBNP (last 3 results) No results for input(s): PROBNP in the last 8760 hours.  CBG: Recent Labs  Lab 06/06/18 1335 06/06/18 1807 06/06/18 2116 06/07/18 0807 06/07/18 1225  GLUCAP 160* 187* 101* 134* 145*    Principal Problem:   Diverticulitis large intestine Active Problems:   Diabetes mellitus without complication ([place])   Accelerated hypertension   Time coordinating discharge: 1 hr  Signed:  Gayland Curry, MD [facility], [place] [phone] 06/07/2018, 2:17 PM

## 2018-06-07 NOTE — Progress Notes (Signed)
PROGRESS NOTE  Brief Narrative: Kenneth Rios is a 52 yo male with a history of HTN, T2DM, HLD, and recent Hartmann's procedure for acute diverticulitis who was readmitted 10/23 by surgery for colostomy reversal. Hospitalist serice consulted for uncontrolled HTN and hyperglycemia.   Subjective: Headache is now mild, "soreness" over left frontal area radiating to bilateral posterior neck, constant, slightly improved with imitrex, also with sleep. Abdominal pain improving but significant. Denies dyspnea, chest pain, palpitations, leg swelling.  Objective: BP (!) 164/102 (BP Location: Left Arm)   Pulse 74   Temp 98.2 F (36.8 C) (Oral)   Resp 16   Ht 5\' 9"  (1.753 m)   Wt 99.8 kg   SpO2 100%   BMI 32.49 kg/m   Gen: Well-appearing Pulm: Clear and nonlabored   CV: RRR, no murmur, no JVD, no edema GI: Soft, +BS, appropriately tender diffusely. Neuro: Alert and oriented. No focal deficits. Skin: LLQ abdominal incision with dressing in place, dry and intact.  Assessment & Plan: Hypertension with hypertensive urgency: Improving with multiple medication changes. Would be wary of overly aggressive correction while inpatient. Has rapport with PCP and will follow up there for ongoing care.  - Continue home coreg and lasix - Recommend increasing lisinopril 10mg  > 20mg  daily.  - Stopped imdur due to HA, replace with norvasc 10mg  daily - Recheck BP, HR, BMP in next 1-2 weeks  T2DM, uncontrolled with hyperglycemia: HbA1c 10.9% indicating chronically inadequate control.  - Continue metformin. - Start lantus 15u daily, as this has provided effective control without hypoglycemia.  - Pt was not taking jardiance because it is not covered by medicaid.  - Appreciate diabetes coordinator instruction and dietary counseling.   Headache: Atypical features for migraine, and improved with lowering of BP as well as discontinuation of nitrate. Head CT typical of chronically uncontrolled HTN with small  vessel ischemic changes, no stroke or space-occupying lesion.  - Recommend following this closely as an outpatient.   Hyperlipidemia:  - Continue statin  s/p colostomy reversal:  - Per surgery  Stable for discharge from a medical perspective. I've sent prescriptions to [facility].   Tyrone Nine, MD Pager [phone] 06/07/2018, 10:59 AM

## 2018-06-08 ENCOUNTER — Telehealth: Payer: Self-pay

## 2018-06-08 NOTE — Telephone Encounter (Signed)
Transition Care Management Follow-up Telephone Call  Date of discharge and from where: 06/07/2018, [facility]  How have you been since you were released from the hospital?  He said that he is " a little sore" but " everything is okay."    Any questions or concerns?  He had no questions and sounded anxious to get off of the phone   Items Reviewed:  Did the pt receive and understand the discharge instructions provided? Yes, he said that he has the instructions and has  no questions about them.  Medications obtained and verified? This CM offered to review the medications but he said that he was "good" with them and did not want to review. This CM noted that there are new medications, changes and medications he is supposed to stop taking and he said that he knows that .   He said that he has a glucometer but has not checked his blood sugar today.  He also has not administered the insulin yet today and did not have any questions about administering it. He said that he was given the insulin before leaving the hospital yesterday and he is planning to do it around the same time today. He did not want to discuss a schedule for administering as the order says to administer daily.  As per Epic, he as discharged at 1438 yesterday.   Any new allergies since your discharge? None reported  Do you have support at home? He said that he lives with his fiance who provides assistance if needed.   Other (ie: DME, Home Health, etc) he said that [facility] called about having the nurse come to see him tomorrow but he doesn't want the nurse to come so soon after discharge. He said that he can do the wound care by himself and he has the needed supplies. He said that he only needs the nurse to come to see how the wound is healing. He noted that he is changing the dressing twice daily. He said that when the nurse calls to schedule a visit he will request that she waits a few days before making a visit.    Functional  Questionnaire: (I = Independent and D = Dependent) ADL's: I   Follow up appointments reviewed:    PCP Hospital f/u appt confirmed? He was offered an appointment for 06/12/18 but he said that he wants to keep his appointment for 06/20/18 @ 1410.  This CM informed him that Dr Alvis Lemmings would like to see him sooner but he refused.   Specialist Hospital f/u appt confirmed? He said that he has the paperwork with the appointments listed..  Are transportation arrangements needed? Yes, said he has a ride  If their condition worsens, is the pt aware to call  their PCP or go to the ED? yes  Was the patient provided with contact information for the PCP's office or ED? He has the number  Was the pt encouraged to call back with questions or concerns? yes

## 2018-06-12 ENCOUNTER — Ambulatory Visit: Payer: Medicaid Other | Admitting: Family Medicine

## 2018-06-12 DIAGNOSIS — Z87891 Personal history of nicotine dependence: Secondary | ICD-10-CM | POA: Diagnosis not present

## 2018-06-12 DIAGNOSIS — Z4801 Encounter for change or removal of surgical wound dressing: Secondary | ICD-10-CM | POA: Diagnosis not present

## 2018-06-12 DIAGNOSIS — F329 Major depressive disorder, single episode, unspecified: Secondary | ICD-10-CM | POA: Diagnosis not present

## 2018-06-12 DIAGNOSIS — Z4802 Encounter for removal of sutures: Secondary | ICD-10-CM | POA: Diagnosis not present

## 2018-06-12 DIAGNOSIS — E669 Obesity, unspecified: Secondary | ICD-10-CM | POA: Diagnosis not present

## 2018-06-12 DIAGNOSIS — E119 Type 2 diabetes mellitus without complications: Secondary | ICD-10-CM | POA: Diagnosis not present

## 2018-06-12 DIAGNOSIS — E785 Hyperlipidemia, unspecified: Secondary | ICD-10-CM | POA: Diagnosis not present

## 2018-06-12 DIAGNOSIS — F419 Anxiety disorder, unspecified: Secondary | ICD-10-CM | POA: Diagnosis not present

## 2018-06-12 DIAGNOSIS — I509 Heart failure, unspecified: Secondary | ICD-10-CM | POA: Diagnosis not present

## 2018-06-12 DIAGNOSIS — I110 Hypertensive heart disease with heart failure: Secondary | ICD-10-CM | POA: Diagnosis not present

## 2018-06-12 DIAGNOSIS — Z8719 Personal history of other diseases of the digestive system: Secondary | ICD-10-CM | POA: Diagnosis not present

## 2018-06-12 DIAGNOSIS — Z9889 Other specified postprocedural states: Secondary | ICD-10-CM | POA: Diagnosis not present

## 2018-06-12 DIAGNOSIS — Z48815 Encounter for surgical aftercare following surgery on the digestive system: Secondary | ICD-10-CM | POA: Diagnosis not present

## 2018-06-15 DIAGNOSIS — E119 Type 2 diabetes mellitus without complications: Secondary | ICD-10-CM | POA: Diagnosis not present

## 2018-06-15 DIAGNOSIS — Z87891 Personal history of nicotine dependence: Secondary | ICD-10-CM | POA: Diagnosis not present

## 2018-06-15 DIAGNOSIS — F329 Major depressive disorder, single episode, unspecified: Secondary | ICD-10-CM | POA: Diagnosis not present

## 2018-06-15 DIAGNOSIS — Z8719 Personal history of other diseases of the digestive system: Secondary | ICD-10-CM | POA: Diagnosis not present

## 2018-06-15 DIAGNOSIS — F419 Anxiety disorder, unspecified: Secondary | ICD-10-CM | POA: Diagnosis not present

## 2018-06-15 DIAGNOSIS — E785 Hyperlipidemia, unspecified: Secondary | ICD-10-CM | POA: Diagnosis not present

## 2018-06-15 DIAGNOSIS — Z4801 Encounter for change or removal of surgical wound dressing: Secondary | ICD-10-CM | POA: Diagnosis not present

## 2018-06-15 DIAGNOSIS — Z48815 Encounter for surgical aftercare following surgery on the digestive system: Secondary | ICD-10-CM | POA: Diagnosis not present

## 2018-06-15 DIAGNOSIS — Z9889 Other specified postprocedural states: Secondary | ICD-10-CM | POA: Diagnosis not present

## 2018-06-15 DIAGNOSIS — Z4802 Encounter for removal of sutures: Secondary | ICD-10-CM | POA: Diagnosis not present

## 2018-06-15 DIAGNOSIS — E669 Obesity, unspecified: Secondary | ICD-10-CM | POA: Diagnosis not present

## 2018-06-15 DIAGNOSIS — I110 Hypertensive heart disease with heart failure: Secondary | ICD-10-CM | POA: Diagnosis not present

## 2018-06-15 DIAGNOSIS — I509 Heart failure, unspecified: Secondary | ICD-10-CM | POA: Diagnosis not present

## 2018-06-15 MED FILL — ATORVASTATIN 80 MG TABLET: 80 | 30 days supply | Qty: 30 | Fill #1

## 2018-06-15 MED FILL — CARVEDILOL 6.25 MG TABLET: 6.25 | 30 days supply | Qty: 60 | Fill #1

## 2018-06-15 MED FILL — FUROSEMIDE 20 MG TABLET: 20 | 30 days supply | Qty: 30 | Fill #1

## 2018-06-20 ENCOUNTER — Ambulatory Visit: Payer: Self-pay | Admitting: Cardiology

## 2018-06-20 ENCOUNTER — Encounter: Payer: Self-pay | Admitting: Family Medicine

## 2018-06-20 ENCOUNTER — Ambulatory Visit: Payer: Medicaid Other | Attending: Family Medicine | Admitting: Family Medicine

## 2018-06-20 VITALS — BP 146/90 | HR 81 | Temp 97.0°F | Ht 69.0 in | Wt 210.6 lb

## 2018-06-20 DIAGNOSIS — K219 Gastro-esophageal reflux disease without esophagitis: Secondary | ICD-10-CM | POA: Insufficient documentation

## 2018-06-20 DIAGNOSIS — E785 Hyperlipidemia, unspecified: Secondary | ICD-10-CM | POA: Diagnosis not present

## 2018-06-20 DIAGNOSIS — Z79899 Other long term (current) drug therapy: Secondary | ICD-10-CM | POA: Insufficient documentation

## 2018-06-20 DIAGNOSIS — F329 Major depressive disorder, single episode, unspecified: Secondary | ICD-10-CM | POA: Insufficient documentation

## 2018-06-20 DIAGNOSIS — Z9889 Other specified postprocedural states: Secondary | ICD-10-CM

## 2018-06-20 DIAGNOSIS — Z7982 Long term (current) use of aspirin: Secondary | ICD-10-CM | POA: Insufficient documentation

## 2018-06-20 DIAGNOSIS — I5042 Chronic combined systolic (congestive) and diastolic (congestive) heart failure: Secondary | ICD-10-CM | POA: Insufficient documentation

## 2018-06-20 DIAGNOSIS — Z8719 Personal history of other diseases of the digestive system: Secondary | ICD-10-CM | POA: Diagnosis not present

## 2018-06-20 DIAGNOSIS — F419 Anxiety disorder, unspecified: Secondary | ICD-10-CM | POA: Diagnosis not present

## 2018-06-20 DIAGNOSIS — I110 Hypertensive heart disease with heart failure: Secondary | ICD-10-CM | POA: Diagnosis not present

## 2018-06-20 DIAGNOSIS — E119 Type 2 diabetes mellitus without complications: Secondary | ICD-10-CM

## 2018-06-20 DIAGNOSIS — I10 Essential (primary) hypertension: Secondary | ICD-10-CM | POA: Diagnosis not present

## 2018-06-20 DIAGNOSIS — E1165 Type 2 diabetes mellitus with hyperglycemia: Secondary | ICD-10-CM | POA: Insufficient documentation

## 2018-06-20 DIAGNOSIS — Z9049 Acquired absence of other specified parts of digestive tract: Secondary | ICD-10-CM | POA: Insufficient documentation

## 2018-06-20 DIAGNOSIS — Z794 Long term (current) use of insulin: Secondary | ICD-10-CM | POA: Insufficient documentation

## 2018-06-20 LAB — GLUCOSE, POCT (MANUAL RESULT ENTRY): POC Glucose: 164 mg/dl — AB (ref 70–99)

## 2018-06-20 MED ORDER — AMLODIPINE BESYLATE 10 MG PO TABS: 10.0000 mg | ORAL_TABLET | Freq: Every day | ORAL | 3 refills | Status: DC

## 2018-06-20 MED ORDER — GABAPENTIN 300 MG PO CAPS: 300.0000 mg | ORAL_CAPSULE | Freq: Two times a day (BID) | ORAL | 3 refills | Status: DC

## 2018-06-20 MED ORDER — ATORVASTATIN CALCIUM 80 MG PO TABS: 80.0000 mg | ORAL_TABLET | Freq: Every day | ORAL | 3 refills | Status: DC

## 2018-06-20 MED ORDER — INSULIN GLARGINE 100 UNIT/ML SOLOSTAR PEN: 15.0000 [IU] | PEN_INJECTOR | Freq: Every day | SUBCUTANEOUS | 3 refills | Status: DC

## 2018-06-20 MED ORDER — LISINOPRIL 20 MG PO TABS: 20.0000 mg | ORAL_TABLET | Freq: Every day | ORAL | 3 refills | Status: DC

## 2018-06-20 MED ORDER — ACCU-CHEK SOFT TOUCH LANCETS MISC: 12 refills | Status: DC

## 2018-06-20 MED ORDER — METFORMIN HCL 500 MG PO TABS: 1000.0000 mg | ORAL_TABLET | Freq: Two times a day (BID) | ORAL | 3 refills | Status: DC

## 2018-06-20 MED ORDER — ACCU-CHEK AVIVA DEVI: 0 refills | Status: DC

## 2018-06-20 MED ORDER — CARVEDILOL 6.25 MG PO TABS: 6.2500 mg | ORAL_TABLET | Freq: Two times a day (BID) | ORAL | 3 refills | Status: DC

## 2018-06-20 MED ORDER — GLUCOSE BLOOD VI STRP: ORAL_STRIP | 12 refills | Status: DC

## 2018-06-20 MED ORDER — FUROSEMIDE 20 MG PO TABS: 20.0000 mg | ORAL_TABLET | Freq: Every day | ORAL | 3 refills | Status: DC

## 2018-06-20 MED FILL — ACCU-CHEK AVIVA PLUS TEST S: 30 days supply | Qty: 100 | Fill #0

## 2018-06-20 MED FILL — ACCU-CHEK SOFTCLIX LANCETS: 30 days supply | Qty: 100 | Fill #0

## 2018-06-20 MED FILL — ACCU-CHEK AVIVA PLUS W/DEVI: W/DEVICE | 30 days supply | Qty: 1 | Fill #0

## 2018-06-20 MED FILL — metFORMIN HCL 500 MG TABS: 500 | 30 days supply | Qty: 120 | Fill #0

## 2018-06-20 MED FILL — oxyCODONE HCL 5 MG TABS: 5 | 5 days supply | Qty: 20 | Fill #0

## 2018-06-20 NOTE — Progress Notes (Signed)
Patient is having pain in abdomen are where colostomy bag was.  Patient needs glucose meter.

## 2018-06-20 NOTE — Patient Instructions (Signed)
Diabetes Mellitus and Nutrition When you have diabetes (diabetes mellitus), it is very important to have healthy eating habits because your blood sugar (glucose) levels are greatly affected by what you eat and drink. Eating healthy foods in the appropriate amounts, at about the same times every day, can help you:  Control your blood glucose.  Lower your risk of heart disease.  Improve your blood pressure.  Reach or maintain a healthy weight.  Every person with diabetes is different, and each person has different needs for a meal plan. Your health care provider may recommend that you work with a diet and nutrition specialist (dietitian) to make a meal plan that is best for you. Your meal plan may vary depending on factors such as:  The calories you need.  The medicines you take.  Your weight.  Your blood glucose, blood pressure, and cholesterol levels.  Your activity level.  Other health conditions you have, such as heart or kidney disease.  How do carbohydrates affect me? Carbohydrates affect your blood glucose level more than any other type of food. Eating carbohydrates naturally increases the amount of glucose in your blood. Carbohydrate counting is a method for keeping track of how many carbohydrates you eat. Counting carbohydrates is important to keep your blood glucose at a healthy level, especially if you use insulin or take certain oral diabetes medicines. It is important to know how many carbohydrates you can safely have in each meal. This is different for every person. Your dietitian can help you calculate how many carbohydrates you should have at each meal and for snack. Foods that contain carbohydrates include:  Bread, cereal, rice, pasta, and crackers.  Potatoes and corn.  Peas, beans, and lentils.  Milk and yogurt.  Fruit and juice.  Desserts, such as cakes, cookies, ice cream, and candy.  How does alcohol affect me? Alcohol can cause a sudden decrease in blood  glucose (hypoglycemia), especially if you use insulin or take certain oral diabetes medicines. Hypoglycemia can be a life-threatening condition. Symptoms of hypoglycemia (sleepiness, dizziness, and confusion) are similar to symptoms of having too much alcohol. If your health care provider says that alcohol is safe for you, follow these guidelines:  Limit alcohol intake to no more than 1 drink per day for nonpregnant women and 2 drinks per day for men. One drink equals 12 oz of beer, 5 oz of wine, or 1 oz of hard liquor.  Do not drink on an empty stomach.  Keep yourself hydrated with water, diet soda, or unsweetened iced tea.  Keep in mind that regular soda, juice, and other mixers may contain a lot of sugar and must be counted as carbohydrates.  What are tips for following this plan? Reading food labels  Start by checking the serving size on the label. The amount of calories, carbohydrates, fats, and other nutrients listed on the label are based on one serving of the food. Many foods contain more than one serving per package.  Check the total grams (g) of carbohydrates in one serving. You can calculate the number of servings of carbohydrates in one serving by dividing the total carbohydrates by 15. For example, if a food has 30 g of total carbohydrates, it would be equal to 2 servings of carbohydrates.  Check the number of grams (g) of saturated and trans fats in one serving. Choose foods that have low or no amount of these fats.  Check the number of milligrams (mg) of sodium in one serving. Most people   should limit total sodium intake to less than 2,300 mg per day.  Always check the nutrition information of foods labeled as "low-fat" or "nonfat". These foods may be higher in added sugar or refined carbohydrates and should be avoided.  Talk to your dietitian to identify your daily goals for nutrients listed on the label. Shopping  Avoid buying canned, premade, or processed foods. These  foods tend to be high in fat, sodium, and added sugar.  Shop around the outside edge of the grocery store. This includes fresh fruits and vegetables, bulk grains, fresh meats, and fresh dairy. Cooking  Use low-heat cooking methods, such as baking, instead of high-heat cooking methods like deep frying.  Cook using healthy oils, such as olive, canola, or sunflower oil.  Avoid cooking with butter, cream, or high-fat meats. Meal planning  Eat meals and snacks regularly, preferably at the same times every day. Avoid going long periods of time without eating.  Eat foods high in fiber, such as fresh fruits, vegetables, beans, and whole grains. Talk to your dietitian about how many servings of carbohydrates you can eat at each meal.  Eat 4-6 ounces of lean protein each day, such as lean meat, chicken, fish, eggs, or tofu. 1 ounce is equal to 1 ounce of meat, chicken, or fish, 1 egg, or 1/4 cup of tofu.  Eat some foods each day that contain healthy fats, such as avocado, nuts, seeds, and fish. Lifestyle   Check your blood glucose regularly.  Exercise at least 30 minutes 5 or more days each week, or as told by your health care provider.  Take medicines as told by your health care provider.  Do not use any products that contain nicotine or tobacco, such as cigarettes and e-cigarettes. If you need help quitting, ask your health care provider.  Work with a counselor or diabetes educator to identify strategies to manage stress and any emotional and social challenges. What are some questions to ask my health care provider?  Do I need to meet with a diabetes educator?  Do I need to meet with a dietitian?  What number can I call if I have questions?  When are the best times to check my blood glucose? Where to find more information:  American Diabetes Association: diabetes.org/food-and-fitness/food  Academy of Nutrition and Dietetics:  www.eatright.org/resources/health/diseases-and-conditions/diabetes  National Institute of Diabetes and Digestive and Kidney Diseases (NIH): www.niddk.nih.gov/health-information/diabetes/overview/diet-eating-physical-activity Summary  A healthy meal plan will help you control your blood glucose and maintain a healthy lifestyle.  Working with a diet and nutrition specialist (dietitian) can help you make a meal plan that is best for you.  Keep in mind that carbohydrates and alcohol have immediate effects on your blood glucose levels. It is important to count carbohydrates and to use alcohol carefully. This information is not intended to replace advice given to you by your health care provider. Make sure you discuss any questions you have with your health care provider. Document Released: 04/22/2005 Document Revised: 08/30/2016 Document Reviewed: 08/30/2016 Elsevier Interactive Patient Education  2018 Elsevier Inc.  

## 2018-06-20 NOTE — Progress Notes (Signed)
Kathleen  Date of Telephone Encounter: 06/08/2018  Date of 1st service: 06/20/2018   Admit Date: 05/31/2018 Discharge Date: 06/07/2018      Subjective:  Patient ID: Kenneth Rios, male    DOB: 1965/09/07  Age: 52 y.o. 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 Hospital course was complicated by severe abdominal pain, hyperglycemia and hyperlipidemia and he was commenced on Lantus and his hypertensive regimen adjusted during his stay.  He was discharged on oxycodone with general surgery follow-up which does not come up to 06/28/2018. He has run out of his oxycodone is currently taking ibuprofen with no relief in symptoms.  Wound dressing changes performed by his fiance who accompanies him today and he does have visits from advanced home care. His A1c is 10.6 which is up from 9.8 previously and he had refused Lantus during his previous visits but had to be placed today during hospitalization which was complicated by hyperglycemia.  He endorses compliance with his Lantus and fasting sugars have been in the 150-200 range.  He does have myths about insulin use and thinks this is associated with amputations and other complications. His blood pressure is elevated and has been out of his some of his antihypertensives. He does have some dyspnea which he states is his baseline but denies pedal edema or chest pain.  Past Medical History:  Diagnosis Date  . Anxiety   . CHF (congestive heart failure) ([place])    "fluttering"  . Depression   . Diabetes  mellitus without complication ([place])   . Diverticulitis   . GERD (gastroesophageal reflux disease)   . Hyperlipidemia   . Hypertension     Past Surgical History:  Procedure Laterality Date  . CARDIAC CATHETERIZATION  03/30/2018  . COLON RESECTION N/A 09/05/2017   Procedure: HARTMAN'S COLECTOMY AND COLOSTOMY;  Surgeon: Kieth Brightly [name], MD;  Location: [place];  Service: General;  Laterality: N/A;  . COLON SURGERY    . COLOSTOMY TAKEDOWN N/A 05/31/2018   Procedure: LAPAROSCOPIC COLOSTOMY REVERSAL COLORECTAL ANASTOMOSIS ERAS PATHWAY;  Surgeon: [name], [name], MD;  Location: [place];  Service: General;  Laterality: N/A;  . INTRAVASCULAR PRESSURE WIRE/FFR STUDY N/A 03/30/2018   Procedure: INTRAVASCULAR PRESSURE WIRE/FFR STUDY;  Surgeon: Nelva Bush, MD;  Location: [place] [facility];  Service: Cardiovascular;  Laterality: N/A;  . LEFT HEART CATH AND CORONARY ANGIOGRAPHY N/A 03/30/2018   Procedure: LEFT HEART CATH AND CORONARY ANGIOGRAPHY;  Surgeon: Nelva Bush, MD;  Location: [place] [facility];  Service: Cardiovascular;  Laterality: N/A;    No Known Allergies   Outpatient Medications Prior to Visit  Medication Sig Dispense Refill  . aspirin EC 81 MG tablet Take 81 mg by mouth daily.    . blood glucose meter kit and supplies Dispense based on patient and insurance preference. Use up to four times daily as directed. (FOR ICD-10 E10.9, E11.9). 1 each 0  . Insulin Pen Needle 31G X 5 MM MISC Inject insulin via insulin pen once daily 90 each 0  . nitroGLYCERIN (NITROSTAT) 0.4 MG SL tablet Place 1 tablet (0.4 mg total) under the tongue  every 5 (five) minutes as needed for chest pain. 25 tablet 11  . amLODipine (NORVASC) 10 MG tablet Take 1 tablet (10 mg total) by mouth daily. 30 tablet 0  . atorvastatin (LIPITOR) 80 MG tablet Take 1 tablet (80 mg total) by mouth daily. 90 tablet 3  . carvedilol (COREG) 6.25 MG tablet Take 1 tablet (6.25 mg total) by mouth 2 (two) times daily with a  meal. 60 tablet 3  . furosemide (LASIX) 20 MG tablet Take 1 tablet (20 mg total) by mouth daily. 30 tablet 3  . gabapentin (NEURONTIN) 300 MG capsule Take 1 capsule (300 mg total) by mouth 2 (two) times daily. 60 capsule 0  . Insulin Glargine (LANTUS SOLOSTAR) 100 UNIT/ML Solostar Pen Inject 15 Units into the skin daily. 15 mL 0  . lisinopril (PRINIVIL,ZESTRIL) 20 MG tablet Take 1 tablet (20 mg total) by mouth daily. 30 tablet 0  . metFORMIN (GLUCOPHAGE) 500 MG tablet Take 2 tablets (1,000 mg total) by mouth 2 (two) times daily with a meal. 120 tablet 3  . oxyCODONE (OXY IR/ROXICODONE) 5 MG immediate release tablet Take 1 tablet (5 mg total) by mouth every 6 (six) hours as needed for breakthrough pain. (Patient not taking: Reported on 06/20/2018) 28 tablet 0   No facility-administered medications prior to visit.     ROS Review of Systems  Constitutional: Negative for activity change and appetite change.  HENT: Negative for sinus pressure and sore throat.   Eyes: Negative for visual disturbance.  Respiratory: Positive for shortness of breath. Negative for cough and chest tightness.   Cardiovascular: Negative for chest pain and leg swelling.  Gastrointestinal: Positive for abdominal pain. Negative for abdominal distention, constipation and diarrhea.  Endocrine: Negative.   Genitourinary: Negative for dysuria.  Musculoskeletal: Negative for joint swelling and myalgias.  Skin: Negative for rash.  Allergic/Immunologic: Negative.   Neurological: Negative for weakness, light-headedness and numbness.  Psychiatric/Behavioral: Negative for dysphoric mood and suicidal ideas.    Objective:  BP (!) 146/90   Pulse 81   Temp (!) 97 F (36.1 C) (Oral)   Ht _0  (1.753 m)   Wt 210 lb 9.6 oz (95.5 kg)   SpO2 99%   BMI 31.10 kg/m   BP/Weight 06/20/2018 06/07/2018 88/87/5797  Systolic BP 282 060 -  Diastolic BP 90 156 -  Wt. (Lbs) 210.6 - 220  BMI 31.1 - 32.49      Physical Exam    Constitutional: He is oriented to person, place, and time. He appears well-developed and well-nourished.  Neck: No JVD present.  Cardiovascular: Normal rate, normal heart sounds and intact distal pulses.  No murmur heard. Pulmonary/Chest: Effort normal and breath sounds normal. He has no wheezes. He has no rales. He exhibits no tenderness.  Abdominal: Soft. Bowel sounds are normal. He exhibits no distension and no mass. There is tenderness.  Clean dressing lateral to the umbilicus.   Musculoskeletal: Normal range of motion.  Neurological: He is alert and oriented to person, place, and time.  Skin: Skin is warm and dry.  Psychiatric: He has a normal mood and affect.    Lab Results  Component Value Date   HGBA1C 10.6 (H) 05/29/2018     Assessment & Plan:   1. Type 2 diabetes mellitus with hyperglycemia, with long-term current use of insulin (HCC) Uncontrolled with A1c of 10.6 Educated on uptitrating dose of Lantus by 2 units every fourth day until blood sugar is at goal Addressed concerns about insulin and  ensured about its effectiveness He will be seen the clinical pharmacist in 1 month for review of his blood sugar log Counseled on Diabetic diet, my plate method, 983 minutes of moderate intensity exercise/week Keep blood sugar logs with fasting goals of 80-120 mg/dl, random of less than 180 and in the event of sugars less than 60 mg/dl or greater than 400 mg/dl please notify the clinic ASAP. It is recommended that you undergo annual eye exams and annual foot exams. Pneumonia vaccine is recommended. - POCT glucose (manual entry) - Blood Glucose Monitoring Suppl (ACCU-CHEK AVIVA) device; Use as instructed 3 times daily.  Dispense: 1 each; Refill: 0 - glucose blood (ACCU-CHEK AVIVA) test strip; Use 3 times daily  Dispense: 100 each; Refill: 12 - Lancets (ACCU-CHEK SOFT TOUCH) lancets; Use as instructed 3 times daily  Dispense: 100 each; Refill: 12 - metFORMIN (GLUCOPHAGE) 500 MG  tablet; Take 2 tablets (1,000 mg total) by mouth 2 (two) times daily with a meal.  Dispense: 120 tablet; Refill: 3 - Insulin Glargine (LANTUS SOLOSTAR) 100 UNIT/ML Solostar Pen; Inject 15 Units into the skin daily.  Dispense: 15 mL; Refill: 3  2. Essential hypertension Uncontrolled Advised to pick up antihypertensives from the pharmacy Counseled on blood pressure goal of less than 130/80, low-sodium, DASH diet, medication compliance, 150 minutes of moderate intensity exercise per week. Discussed medication compliance, adverse effects. - lisinopril (PRINIVIL,ZESTRIL) 20 MG tablet; Take 1 tablet (20 mg total) by mouth daily.  Dispense: 30 tablet; Refill: 3 - carvedilol (COREG) 6.25 MG tablet; Take 1 tablet (6.25 mg total) by mouth 2 (two) times daily with a meal.  Dispense: 60 tablet; Refill: 3 - amLODipine (NORVASC) 10 MG tablet; Take 1 tablet (10 mg total) by mouth daily.  Dispense: 30 tablet; Refill: 3 - Basic Metabolic Panel  3. Anxiety and depression Secondary to underlying medical conditions Declined initiation of medications at his last visit  4. History of colostomy reversal Advised we do not prescribe oxycodone in this clinic but can write Tylenol No. 3 or tramadol. He will need to discuss with his surgeon for refill of his opioid analgesics to prevent obtaining this from multiple physicians  5. Diabetes mellitus without complication (HCC) - metFORMIN (GLUCOPHAGE) 500 MG tablet; Take 2 tablets (1,000 mg total) by mouth 2 (two) times daily with a meal.  Dispense: 120 tablet; Refill: 3 - atorvastatin (LIPITOR) 80 MG tablet; Take 1 tablet (80 mg total) by mouth daily.  Dispense: 30 tablet; Refill: 3  6. Chronic combined systolic and diastolic congestive heart failure (HCC) Stable with a EF of 40 to 45% Continue Lasix, beta-blocker, ACE inhibitor Daily weight checks, fluid restriction, low-sodium, heart healthy diet - furosemide (LASIX) 20 MG tablet; Take 1 tablet (20 mg total) by  mouth daily.  Dispense: 30 tablet; Refill: 3   Meds ordered this encounter  Medications  . Blood Glucose Monitoring Suppl (ACCU-CHEK AVIVA) device    Sig: Use as instructed 3 times daily.    Dispense:  1 each    Refill:  0  . glucose blood (ACCU-CHEK AVIVA) test strip    Sig: Use 3 times daily    Dispense:  100 each    Refill:  12  . Lancets (ACCU-CHEK SOFT TOUCH) lancets    Sig: Use as instructed 3 times daily    Dispense:  100 each    Refill:  12  . metFORMIN (GLUCOPHAGE) 500 MG tablet    Sig: Take 2 tablets (1,000 mg total) by mouth 2 (  two) times daily with a meal.    Dispense:  120 tablet    Refill:  3  . Insulin Glargine (LANTUS SOLOSTAR) 100 UNIT/ML Solostar Pen    Sig: Inject 15 Units into the skin daily.    Dispense:  15 mL    Refill:  3  . lisinopril (PRINIVIL,ZESTRIL) 20 MG tablet    Sig: Take 1 tablet (20 mg total) by mouth daily.    Dispense:  30 tablet    Refill:  3  . gabapentin (NEURONTIN) 300 MG capsule    Sig: Take 1 capsule (300 mg total) by mouth 2 (two) times daily.    Dispense:  60 capsule    Refill:  3  . furosemide (LASIX) 20 MG tablet    Sig: Take 1 tablet (20 mg total) by mouth daily.    Dispense:  30 tablet    Refill:  3  . carvedilol (COREG) 6.25 MG tablet    Sig: Take 1 tablet (6.25 mg total) by mouth 2 (two) times daily with a meal.    Dispense:  60 tablet    Refill:  3  . atorvastatin (LIPITOR) 80 MG tablet    Sig: Take 1 tablet (80 mg total) by mouth daily.    Dispense:  30 tablet    Refill:  3  . amLODipine (NORVASC) 10 MG tablet    Sig: Take 1 tablet (10 mg total) by mouth daily.    Dispense:  30 tablet    Refill:  3    Follow-up: Return in about 1 month (around 07/20/2018) for Diabetic teaching with Lurena Joiner; 3 months with PCP.   Charlott Rakes MD

## 2018-06-21 DIAGNOSIS — Z87891 Personal history of nicotine dependence: Secondary | ICD-10-CM | POA: Diagnosis not present

## 2018-06-21 DIAGNOSIS — E785 Hyperlipidemia, unspecified: Secondary | ICD-10-CM | POA: Diagnosis not present

## 2018-06-21 DIAGNOSIS — Z9889 Other specified postprocedural states: Secondary | ICD-10-CM | POA: Diagnosis not present

## 2018-06-21 DIAGNOSIS — I110 Hypertensive heart disease with heart failure: Secondary | ICD-10-CM | POA: Diagnosis not present

## 2018-06-21 DIAGNOSIS — Z48815 Encounter for surgical aftercare following surgery on the digestive system: Secondary | ICD-10-CM | POA: Diagnosis not present

## 2018-06-21 DIAGNOSIS — E669 Obesity, unspecified: Secondary | ICD-10-CM | POA: Diagnosis not present

## 2018-06-21 DIAGNOSIS — Z4801 Encounter for change or removal of surgical wound dressing: Secondary | ICD-10-CM | POA: Diagnosis not present

## 2018-06-21 DIAGNOSIS — Z4802 Encounter for removal of sutures: Secondary | ICD-10-CM | POA: Diagnosis not present

## 2018-06-21 DIAGNOSIS — F329 Major depressive disorder, single episode, unspecified: Secondary | ICD-10-CM | POA: Diagnosis not present

## 2018-06-21 DIAGNOSIS — I509 Heart failure, unspecified: Secondary | ICD-10-CM | POA: Diagnosis not present

## 2018-06-21 DIAGNOSIS — F419 Anxiety disorder, unspecified: Secondary | ICD-10-CM | POA: Diagnosis not present

## 2018-06-21 DIAGNOSIS — Z8719 Personal history of other diseases of the digestive system: Secondary | ICD-10-CM | POA: Diagnosis not present

## 2018-06-21 DIAGNOSIS — E119 Type 2 diabetes mellitus without complications: Secondary | ICD-10-CM | POA: Diagnosis not present

## 2018-06-21 LAB — BASIC METABOLIC PANEL
BUN / CREAT RATIO: 12 (ref 9–20)
BUN: 12 mg/dL (ref 6–24)
CHLORIDE: 102 mmol/L (ref 96–106)
CO2: 23 mmol/L (ref 20–29)
Calcium: 9.6 mg/dL (ref 8.7–10.2)
Creatinine, Ser: 0.99 mg/dL (ref 0.76–1.27)
GFR, EST AFRICAN AMERICAN: 101 mL/min/{1.73_m2} (ref >59)
GFR, EST NON AFRICAN AMERICAN: 87 mL/min/{1.73_m2} (ref >59)
GLUCOSE: 135 mg/dL — AB (ref 65–99)
Potassium: 4.6 mmol/L (ref 3.5–5.2)
Sodium: 142 mmol/L (ref 134–144)

## 2018-06-22 DIAGNOSIS — Z4801 Encounter for change or removal of surgical wound dressing: Secondary | ICD-10-CM | POA: Diagnosis not present

## 2018-06-22 DIAGNOSIS — E669 Obesity, unspecified: Secondary | ICD-10-CM | POA: Diagnosis not present

## 2018-06-22 DIAGNOSIS — Z9889 Other specified postprocedural states: Secondary | ICD-10-CM | POA: Diagnosis not present

## 2018-06-22 DIAGNOSIS — Z87891 Personal history of nicotine dependence: Secondary | ICD-10-CM | POA: Diagnosis not present

## 2018-06-22 DIAGNOSIS — I110 Hypertensive heart disease with heart failure: Secondary | ICD-10-CM | POA: Diagnosis not present

## 2018-06-22 DIAGNOSIS — E785 Hyperlipidemia, unspecified: Secondary | ICD-10-CM | POA: Diagnosis not present

## 2018-06-22 DIAGNOSIS — Z4802 Encounter for removal of sutures: Secondary | ICD-10-CM | POA: Diagnosis not present

## 2018-06-22 DIAGNOSIS — F419 Anxiety disorder, unspecified: Secondary | ICD-10-CM | POA: Diagnosis not present

## 2018-06-22 DIAGNOSIS — I509 Heart failure, unspecified: Secondary | ICD-10-CM | POA: Diagnosis not present

## 2018-06-22 DIAGNOSIS — Z48815 Encounter for surgical aftercare following surgery on the digestive system: Secondary | ICD-10-CM | POA: Diagnosis not present

## 2018-06-22 DIAGNOSIS — F329 Major depressive disorder, single episode, unspecified: Secondary | ICD-10-CM | POA: Diagnosis not present

## 2018-06-22 DIAGNOSIS — E119 Type 2 diabetes mellitus without complications: Secondary | ICD-10-CM | POA: Diagnosis not present

## 2018-06-22 DIAGNOSIS — Z8719 Personal history of other diseases of the digestive system: Secondary | ICD-10-CM | POA: Diagnosis not present

## 2018-06-23 ENCOUNTER — Telehealth: Payer: Self-pay

## 2018-06-23 NOTE — Telephone Encounter (Signed)
Patient was called and informed of lab results. 

## 2018-06-23 NOTE — Telephone Encounter (Signed)
-----   Message from Hoy RegisterEnobong Newlin, MD sent at 06/21/2018  9:29 AM EST ----- Labs are stable

## 2018-06-28 ENCOUNTER — Ambulatory Visit: Payer: Self-pay | Admitting: Cardiology

## 2018-07-04 ENCOUNTER — Ambulatory Visit: Payer: Medicaid Other | Admitting: Family Medicine

## 2018-07-10 ENCOUNTER — Ambulatory Visit: Payer: Self-pay | Admitting: Cardiology

## 2018-07-24 ENCOUNTER — Ambulatory Visit: Payer: Medicaid Other | Admitting: Pharmacist

## 2018-07-25 ENCOUNTER — Ambulatory Visit: Payer: Medicaid Other | Admitting: Pharmacist

## 2018-09-07 ENCOUNTER — Telehealth: Payer: Self-pay | Admitting: Family Medicine

## 2018-09-07 ENCOUNTER — Ambulatory Visit: Payer: Medicaid Other | Admitting: Family Medicine

## 2018-09-07 MED FILL — CARVEDILOL 6.25 MG TABLET: 6.25 | 30 days supply | Qty: 60 | Fill #0

## 2018-09-07 MED FILL — FUROSEMIDE 20 MG TABS: 20 | 30 days supply | Qty: 30 | Fill #0

## 2018-09-07 MED FILL — ATORVASTATIN CALCIUM 80 MG: 80 | 30 days supply | Qty: 30 | Fill #0

## 2018-09-07 MED FILL — metFORMIN HCL 500 MG TABS: 500 | 30 days supply | Qty: 120 | Fill #0

## 2018-09-07 MED FILL — GABAPENTIN 300 MG CAPSULE: 300 | 30 days supply | Qty: 60 | Fill #0

## 2018-09-07 NOTE — Telephone Encounter (Signed)
1) Medication(s) Requested (by name): Metformin Lisinopril Lasix Carvedilol Atorvastatin Gabapentin  2) Pharmacy of Choice:  [facility]

## 2018-09-07 NOTE — Telephone Encounter (Signed)
Pt had refills @ [facility] that were electronically transferred to [facility]. No further action required.

## 2018-09-08 MED FILL — LISINOPRIL 20 MG TAB: 20 | 30 days supply | Qty: 30 | Fill #0

## 2018-09-26 ENCOUNTER — Ambulatory Visit: Payer: Medicaid Other | Admitting: Family Medicine

## 2018-10-05 ENCOUNTER — Ambulatory Visit: Payer: Medicaid Other | Attending: Family Medicine | Admitting: Family Medicine

## 2018-10-05 ENCOUNTER — Encounter: Payer: Self-pay | Admitting: Family Medicine

## 2018-10-05 VITALS — BP 147/96 | HR 68 | Temp 98.4°F | Ht 69.0 in | Wt 207.6 lb

## 2018-10-05 DIAGNOSIS — Z9114 Patient's other noncompliance with medication regimen: Secondary | ICD-10-CM | POA: Diagnosis not present

## 2018-10-05 DIAGNOSIS — Z79899 Other long term (current) drug therapy: Secondary | ICD-10-CM | POA: Diagnosis not present

## 2018-10-05 DIAGNOSIS — Z7982 Long term (current) use of aspirin: Secondary | ICD-10-CM | POA: Diagnosis not present

## 2018-10-05 DIAGNOSIS — K219 Gastro-esophageal reflux disease without esophagitis: Secondary | ICD-10-CM | POA: Insufficient documentation

## 2018-10-05 DIAGNOSIS — Z794 Long term (current) use of insulin: Secondary | ICD-10-CM | POA: Diagnosis not present

## 2018-10-05 DIAGNOSIS — I110 Hypertensive heart disease with heart failure: Secondary | ICD-10-CM | POA: Diagnosis not present

## 2018-10-05 DIAGNOSIS — E1165 Type 2 diabetes mellitus with hyperglycemia: Secondary | ICD-10-CM | POA: Diagnosis not present

## 2018-10-05 DIAGNOSIS — I5042 Chronic combined systolic (congestive) and diastolic (congestive) heart failure: Secondary | ICD-10-CM | POA: Insufficient documentation

## 2018-10-05 DIAGNOSIS — Z8249 Family history of ischemic heart disease and other diseases of the circulatory system: Secondary | ICD-10-CM | POA: Diagnosis not present

## 2018-10-05 DIAGNOSIS — E119 Type 2 diabetes mellitus without complications: Secondary | ICD-10-CM | POA: Diagnosis not present

## 2018-10-05 DIAGNOSIS — R040 Epistaxis: Secondary | ICD-10-CM | POA: Insufficient documentation

## 2018-10-05 DIAGNOSIS — H538 Other visual disturbances: Secondary | ICD-10-CM | POA: Diagnosis not present

## 2018-10-05 DIAGNOSIS — Z9049 Acquired absence of other specified parts of digestive tract: Secondary | ICD-10-CM | POA: Diagnosis not present

## 2018-10-05 DIAGNOSIS — R0982 Postnasal drip: Secondary | ICD-10-CM | POA: Diagnosis not present

## 2018-10-05 DIAGNOSIS — I10 Essential (primary) hypertension: Secondary | ICD-10-CM | POA: Diagnosis not present

## 2018-10-05 DIAGNOSIS — E785 Hyperlipidemia, unspecified: Secondary | ICD-10-CM | POA: Insufficient documentation

## 2018-10-05 DIAGNOSIS — Z833 Family history of diabetes mellitus: Secondary | ICD-10-CM | POA: Diagnosis not present

## 2018-10-05 LAB — POCT GLYCOSYLATED HEMOGLOBIN (HGB A1C): HBA1C, POC (CONTROLLED DIABETIC RANGE): 12.2 % — AB (ref 0.0–7.0)

## 2018-10-05 LAB — GLUCOSE, POCT (MANUAL RESULT ENTRY): POC GLUCOSE: 299 mg/dL — AB (ref 70–99)

## 2018-10-05 MED ORDER — METFORMIN HCL 500 MG PO TABS: 1000.0000 mg | ORAL_TABLET | Freq: Two times a day (BID) | ORAL | 3 refills | Status: DC

## 2018-10-05 MED ORDER — FLUTICASONE PROPIONATE 50 MCG/ACT NA SUSP: 2.0000 | Freq: Every day | NASAL | 1 refills | Status: DC

## 2018-10-05 MED ORDER — CARVEDILOL 6.25 MG PO TABS: 6.2500 mg | ORAL_TABLET | Freq: Two times a day (BID) | ORAL | 3 refills | Status: DC

## 2018-10-05 MED ORDER — AMLODIPINE BESYLATE 10 MG PO TABS: 10.0000 mg | ORAL_TABLET | Freq: Every day | ORAL | 3 refills | Status: DC

## 2018-10-05 MED ORDER — INSULIN GLARGINE 100 UNIT/ML SOLOSTAR PEN: 15.0000 [IU] | PEN_INJECTOR | Freq: Every day | SUBCUTANEOUS | 3 refills | Status: DC

## 2018-10-05 MED ORDER — LISINOPRIL 20 MG PO TABS: 20.0000 mg | ORAL_TABLET | Freq: Every day | ORAL | 3 refills | Status: DC

## 2018-10-05 MED ORDER — GABAPENTIN 300 MG PO CAPS: 300.0000 mg | ORAL_CAPSULE | Freq: Two times a day (BID) | ORAL | 3 refills | Status: DC

## 2018-10-05 MED ORDER — ATORVASTATIN CALCIUM 80 MG PO TABS: 80.0000 mg | ORAL_TABLET | Freq: Every day | ORAL | 3 refills | Status: DC

## 2018-10-05 MED ORDER — INSULIN PEN NEEDLE 31G X 5 MM MISC: 3 refills | Status: DC

## 2018-10-05 MED ORDER — CETIRIZINE HCL 10 MG PO TABS: 10.0000 mg | ORAL_TABLET | Freq: Every day | ORAL | 1 refills | Status: DC

## 2018-10-05 MED FILL — ATORVASTATIN 80 MG TABLET: 80 | 30 days supply | Qty: 30 | Fill #0

## 2018-10-05 MED FILL — CARVEDILOL 6.25 MG TABLET: 6.25 | 30 days supply | Qty: 60 | Fill #0 | Status: TO

## 2018-10-05 MED FILL — LISINOPRIL 20 MG TABLET: 20 | 30 days supply | Qty: 30 | Fill #0 | Status: TO

## 2018-10-05 MED FILL — metFORMIN HCL 500 MG TABS: 500 | 30 days supply | Qty: 120 | Fill #0 | Status: TO

## 2018-10-05 MED FILL — UNIFINE PENTIPS 31GX3/16": 31G X 5 MM | 30 days supply | Qty: 30 | Fill #0 | Status: TO

## 2018-10-05 MED FILL — LANTUS SOLOSTAR 100 UNITS/M: 100 | 20 days supply | Qty: 3 | Fill #0

## 2018-10-05 MED FILL — UNIFINE PENTIPS 31GX3/16: 31G X 5 MM | 30 days supply | Qty: 30 | Fill #0 | Status: TO

## 2018-10-05 MED FILL — GABAPENTIN 300 MG CAPSULE: 300 | 30 days supply | Qty: 60 | Fill #0 | Status: TO

## 2018-10-05 MED FILL — FLUTICASONE PROP 50 MCG SPR: 50 | 30 days supply | Qty: 16 | Fill #0

## 2018-10-05 MED FILL — AMLODIPINE BESYLATE 10 MG T: 10 | 30 days supply | Qty: 30 | Fill #0 | Status: TO

## 2018-10-05 MED FILL — CETIRIZINE HCL 10 MG TABS: 10 | 30 days supply | Qty: 30 | Fill #0 | Status: TO

## 2018-10-05 NOTE — Progress Notes (Signed)
  Subjective:  Patient ID: Kenneth Rios, male    DOB: 09/29/1965  Age: 52 y.o. 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  His A1c is 12.2 which has trended up from 10.6 previously.  He has been noncompliant with his Lantus because he does not like injections and has been compliant with metformin.  He would rather have oral medications for management of his diabetes mellitus. Endorses slight blurry vision but no neuropathy, no hypoglycemia. Compliant with his antihypertensives and denies adverse effects and is doing well on his statin. With regards to his CHF he denies dyspnea, pedal edema, orthopnea and has a good exercise tolerance.  He has had episodes of epistaxis which have occurred about 2 times per month with last episode last month.  Denies picking at his nostrils and episodes involved seen blood which usually resolves after a few minutes after holding his head back for couple of seconds.  He endorses postnasal drip, mucus in his throat but denies sinus pressure or sinus tenderness.  Past Medical History:  Diagnosis Date  . Anxiety   . CHF (congestive heart failure) (HCC)    "fluttering"  . Depression   . Diabetes mellitus without complication (HCC)   . Diverticulitis   . GERD (gastroesophageal reflux disease)   . Hyperlipidemia   . Hypertension     Past Surgical History:  Procedure Laterality Date  . CARDIAC CATHETERIZATION  03/30/2018  . COLON RESECTION N/A 09/05/2017   Procedure: HARTMAN'S COLECTOMY AND COLOSTOMY;  Surgeon: [name], Luke Aaron, MD;  Location: [facility] OR;  Service: General;  Laterality: N/A;  . COLON SURGERY    . COLOSTOMY TAKEDOWN N/A  05/31/2018   Procedure: LAPAROSCOPIC COLOSTOMY REVERSAL COLORECTAL ANASTOMOSIS ERAS PATHWAY;  Surgeon: [name], Luke Aaron, MD;  Location: [facility] OR;  Service: General;  Laterality: N/A;  . INTRAVASCULAR PRESSURE WIRE/FFR STUDY N/A 03/30/2018   Procedure: INTRAVASCULAR PRESSURE WIRE/FFR STUDY;  Surgeon: End, Christopher, MD;  Location: [facility];  Service: Cardiovascular;  Laterality: N/A;  . LEFT HEART CATH AND CORONARY ANGIOGRAPHY N/A 03/30/2018   Procedure: LEFT HEART CATH AND CORONARY ANGIOGRAPHY;  Surgeon: End, Christopher, MD;  Location: [facility];  Service: Cardiovascular;  Laterality: N/A;    Family History  Problem Relation Age of Onset  . Heart disease Mother   . Heart disease Sister   . Diabetes Maternal Aunt   . Heart disease Maternal Aunt   . Diabetes Maternal Uncle   . Sudden Cardiac Death Neg Hx     No Known Allergies  Outpatient Medications Prior to Visit  Medication Sig Dispense Refill  . aspirin EC 81 MG tablet Take 81 mg by mouth daily.    . blood glucose meter kit and supplies Dispense based on patient and insurance preference. Use up to four times daily as directed. (FOR ICD-10 E10.9, E11.9). 1 each 0  . Blood Glucose Monitoring Suppl (ACCU-CHEK AVIVA) device Use as instructed 3 times daily. 1 each 0  . furosemide (LASIX) 20 MG tablet Take 1 tablet (20 mg total) by mouth daily. 30 tablet 3  . glucose blood (ACCU-CHEK AVIVA) test strip Use 3 times daily 100 each 12  . Lancets (ACCU-CHEK SOFT TOUCH) lancets Use as   instructed 3 times daily 100 each 12  . amLODipine (NORVASC) 10 MG tablet Take 1 tablet (10 mg total) by mouth daily. 30 tablet 3  . atorvastatin (LIPITOR) 80 MG tablet Take 1 tablet (80 mg total) by mouth daily. 30 tablet 3  . carvedilol (COREG) 6.25 MG tablet Take 1 tablet (6.25 mg total) by mouth 2 (two) times daily with a meal. 60 tablet 3  . gabapentin (NEURONTIN) 300 MG capsule Take 1 capsule (300 mg total) by mouth 2 (two) times daily.  60 capsule 3  . Insulin Glargine (LANTUS SOLOSTAR) 100 UNIT/ML Solostar Pen Inject 15 Units into the skin daily. 15 mL 3  . Insulin Pen Needle 31G X 5 MM MISC Inject insulin via insulin pen once daily 90 each 0  . lisinopril (PRINIVIL,ZESTRIL) 20 MG tablet Take 1 tablet (20 mg total) by mouth daily. 30 tablet 3  . metFORMIN (GLUCOPHAGE) 500 MG tablet Take 2 tablets (1,000 mg total) by mouth 2 (two) times daily with a meal. 120 tablet 3  . nitroGLYCERIN (NITROSTAT) 0.4 MG SL tablet Place 1 tablet (0.4 mg total) under the tongue every 5 (five) minutes as needed for chest pain. 25 tablet 11  . oxyCODONE (OXY IR/ROXICODONE) 5 MG immediate release tablet Take 1 tablet (5 mg total) by mouth every 6 (six) hours as needed for breakthrough pain. (Patient not taking: Reported on 10/05/2018) 28 tablet 0   No facility-administered medications prior to visit.      ROS Review of Systems General: negative for fever, weight loss, appetite change Eyes: no visual symptoms. ENT: no ear symptoms, no sinus tenderness, no nasal congestion or sore throat.+epistaxis Neck: no pain  Respiratory: no wheezing, shortness of breath, cough Cardiovascular: no chest pain, no dyspnea on exertion, no pedal edema, no orthopnea. Gastrointestinal: no abdominal pain, no diarrhea, no constipation Genito-Urinary: no urinary frequency, no dysuria, no polyuria. Hematologic: no bruising Endocrine: no cold or heat intolerance Neurological: no headaches, no seizures, no tremors Musculoskeletal: no joint pains, no joint swelling Skin: no pruritus, no rash. Psychological: no depression, no anxiety,    Objective:  BP (!) 147/96   Pulse 68   Temp 98.4 F (36.9 C) (Oral)   Ht 5' 9" (1.753 m)   Wt 207 lb 9.6 oz (94.2 kg)   SpO2 98%   BMI 30.66 kg/m   BP/Weight 10/05/2018 06/20/2018 57/84/6962  Systolic BP 952 841 324  Diastolic BP 96 90 401  Wt. (Lbs) 207.6 210.6 -  BMI 30.66 31.1 -      Physical Exam Constitutional:  normal appearing,  Eyes: PERRLA HEENT: Head is atraumatic, normal sinuses, normal nasal passages bilaterally, normal oropharynx, normal appearing tonsils and palate, tympanic membrane is normal bilaterally. Neck: normal range of motion, no thyromegaly, no JVD Cardiovascular: normal rate and rhythm, normal heart sounds, no murmurs, rub or gallop, no pedal edema Respiratory: Normal breath sounds, clear to auscultation bilaterally, no wheezes, no rales, no rhonchi Abdomen: soft, not tender to palpation, normal bowel sounds, no enlarged organs, healed surgical abdominal scars Musculoskeletal: Full ROM, no tenderness in joints Skin: warm and dry, no lesions. Neurological: alert, oriented x3, cranial nerves I-XII grossly intact , normal motor strength, normal sensation. Psychological: normal mood.   CMP Latest Ref Rng & Units 06/20/2018 06/05/2018 06/03/2018  Glucose 65 - 99 mg/dL 135(H) 190(H) 165(H)  BUN 6 - 24 mg/dL _0 Creatinine 0.76 - 1.27 mg/dL 0.99 0.81 0.91  Sodium 134 - 144 mmol/L 142 137 139  Potassium 3.5 - 5.2 mmol/L 4.6 3.3(L) 3.5  Chloride 96 - 106 mmol/L 102 105 105  CO2 20 - 29 mmol/L 23 26 27  Calcium 8.7 - 10.2 mg/dL 9.6 8.8(L) 8.3(L)  Total Protein 6.5 - 8.1 g/dL - - -  Total Bilirubin 0.3 - 1.2 mg/dL - - -  Alkaline Phos 38 - 126 U/L - - -  AST 15 - 41 U/L - - -  ALT 17 - 63 U/L - - -    Lipid Panel     Component Value Date/Time   CHOL 165 03/31/2018 0227   TRIG 431 (H) 03/31/2018 0227   HDL 27 (L) 03/31/2018 0227   CHOLHDL 6.1 03/31/2018 0227   VLDL UNABLE TO CALCULATE IF TRIGLYCERIDE OVER 400 mg/dL 03/31/2018 0227   LDLCALC UNABLE TO CALCULATE IF TRIGLYCERIDE OVER 400 mg/dL 03/31/2018 0227    CBC    Component Value Date/Time   WBC 5.8 06/05/2018 0820   RBC 3.35 (L) 06/05/2018 0820   HGB 9.2 (L) 06/05/2018 0820   HGB 14.9 03/29/2018 1120   HCT 27.2 (L) 06/05/2018 0820   HCT 42.2 03/29/2018 1120   PLT 331 06/05/2018 0820   PLT 300 03/29/2018 1120     MCV 81.2 06/05/2018 0820   MCV 84 03/29/2018 1120   MCV 82 03/14/2014 0400   MCH 27.5 06/05/2018 0820   MCHC 33.8 06/05/2018 0820   RDW 12.5 06/05/2018 0820   RDW 13.7 03/29/2018 1120   RDW 13.5 03/14/2014 0400   LYMPHSABS 2.4 06/05/2018 0820   LYMPHSABS 0.9 (L) 03/14/2014 0400   MONOABS 0.4 06/05/2018 0820   MONOABS 0.1 (L) 03/14/2014 0400   EOSABS 0.4 06/05/2018 0820   EOSABS 0.0 03/14/2014 0400   BASOSABS 0.0 06/05/2018 0820   BASOSABS 0.0 03/14/2014 0400    Lab Results  Component Value Date   HGBA1C 12.2 (A) 10/05/2018    Assessment & Plan:   1. Diabetes mellitus without complication (HCC) Uncontrolled with A1c of 12.2 and goal is less than 7.0 Noncompliant with Lantus but after much discussion and shared decision making especially after discussing that oral medications are unlikely to achieve optimal glycemic control in his case he is willing to commence Lantus even though he is reluctant Counseled on Diabetic diet, my plate method, 150 minutes of moderate intensity exercise/week Keep blood sugar logs with fasting goals of 80-120 mg/dl, random of less than 180 and in the event of sugars less than 60 mg/dl or greater than 400 mg/dl please notify the clinic ASAP. It is recommended that you undergo annual eye exams and annual foot exams. Pneumonia vaccine is recommended. - POCT glucose (manual entry) - POCT glycosylated hemoglobin (Hb A1C) - Ambulatory referral to Ophthalmology - Lipid panel - Microalbumin / creatinine urine ratio - atorvastatin (LIPITOR) 80 MG tablet; Take 1 tablet (80 mg total) by mouth daily.  Dispense: 30 tablet; Refill: 3 - metFORMIN (GLUCOPHAGE) 500 MG tablet; Take 2 tablets (1,000 mg total) by mouth 2 (two) times daily with a meal.  Dispense: 120 tablet; Refill: 3  2. Essential hypertension Slight elevation No regimen changes today Counseled on blood pressure goal of less than 130/80, low-sodium, DASH diet, medication compliance, 150 minutes of  moderate intensity exercise per week. Discussed medication compliance, adverse effects. - amLODipine (NORVASC) 10 MG tablet; Take 1 tablet (10 mg total) by mouth daily.  Dispense: 30 tablet; Refill: 3 - carvedilol (COREG) 6.25 MG tablet; Take 1 tablet (6.25 mg total) by mouth 2 (two) times   daily with a meal.  Dispense: 60 tablet; Refill: 3 - lisinopril (PRINIVIL,ZESTRIL) 20 MG tablet; Take 1 tablet (20 mg total) by mouth daily.  Dispense: 30 tablet; Refill: 3  3. Type 2 diabetes mellitus with hyperglycemia, with long-term current use of insulin (HCC) See #1 above - Insulin Glargine (LANTUS SOLOSTAR) 100 UNIT/ML Solostar Pen; Inject 15 Units into the skin daily.  Dispense: 15 mL; Refill: 3 - metFORMIN (GLUCOPHAGE) 500 MG tablet; Take 2 tablets (1,000 mg total) by mouth 2 (two) times daily with a meal.  Dispense: 120 tablet; Refill: 3  4. Epistaxis Likely sinus related versus from the heating system at home Discussed precautions - cetirizine (ZYRTEC) 10 MG tablet; Take 1 tablet (10 mg total) by mouth daily.  Dispense: 30 tablet; Refill: 1 - fluticasone (FLONASE) 50 MCG/ACT nasal spray; Place 2 sprays into both nostrils daily.  Dispense: 16 g; Refill: 1  5. Chronic combined systolic and diastolic congestive heart failure (HCC) EF 45 to 50% from echo of 08/2017 Euvolemic Continue ACE inhibitor, beta-blocker, Lasix   Meds ordered this encounter  Medications  . amLODipine (NORVASC) 10 MG tablet    Sig: Take 1 tablet (10 mg total) by mouth daily.    Dispense:  30 tablet    Refill:  3  . atorvastatin (LIPITOR) 80 MG tablet    Sig: Take 1 tablet (80 mg total) by mouth daily.    Dispense:  30 tablet    Refill:  3  . carvedilol (COREG) 6.25 MG tablet    Sig: Take 1 tablet (6.25 mg total) by mouth 2 (two) times daily with a meal.    Dispense:  60 tablet    Refill:  3  . gabapentin (NEURONTIN) 300 MG capsule    Sig: Take 1 capsule (300 mg total) by mouth 2 (two) times daily.    Dispense:  60  capsule    Refill:  3  . Insulin Glargine (LANTUS SOLOSTAR) 100 UNIT/ML Solostar Pen    Sig: Inject 15 Units into the skin daily.    Dispense:  15 mL    Refill:  3  . lisinopril (PRINIVIL,ZESTRIL) 20 MG tablet    Sig: Take 1 tablet (20 mg total) by mouth daily.    Dispense:  30 tablet    Refill:  3  . metFORMIN (GLUCOPHAGE) 500 MG tablet    Sig: Take 2 tablets (1,000 mg total) by mouth 2 (two) times daily with a meal.    Dispense:  120 tablet    Refill:  3  . cetirizine (ZYRTEC) 10 MG tablet    Sig: Take 1 tablet (10 mg total) by mouth daily.    Dispense:  30 tablet    Refill:  1  . fluticasone (FLONASE) 50 MCG/ACT nasal spray    Sig: Place 2 sprays into both nostrils daily.    Dispense:  16 g    Refill:  1  . Insulin Pen Needle 31G X 5 MM MISC    Sig: Inject insulin via insulin pen once daily    Dispense:  90 each    Refill:  3    Follow-up: Return in about 3 months (around 01/03/2019) for follow up of chronic medical conditions.       Enobong Newlin, MD, FAAFP. [facility] [place], [place] [phone]   10/05/2018, 11:01 AM 

## 2018-10-05 NOTE — Patient Instructions (Signed)
Nosebleed, Adult  A nosebleed is when blood comes out of the nose. Nosebleeds are common. Usually, they are not a sign of a serious condition.  Nosebleeds can happen if a small blood vessel in your nose starts to bleed or if the lining of your nose (mucous membrane) cracks. They are commonly caused by:   Allergies.   Colds.   Picking your nose.   Blowing your nose too hard.   An injury from sticking an object into your nose or getting hit in the nose.   Dry or cold air.  Less common causes of nosebleeds include:   Toxic fumes.   Something abnormal in the nose or in the air-filled spaces in the bones of the face (sinuses).   Growths in the nose, such as polyps.   Medicines or conditions that cause blood to clot slowly.   Certain illnesses or procedures that irritate or dry out the nasal passages.  Follow these instructions at home:  When you have a nosebleed:     Sit down and tilt your head slightly forward.   Use a clean towel or tissue to pinch your nostrils under the bony part of your nose. After 10 minutes, let go of your nose and see if bleeding starts again. Do not release pressure before that time. If there is still bleeding, repeat the pinching and holding for 10 minutes until the bleeding stops.   Do not place tissues or gauze in the nose to stop bleeding.   Avoid lying down and avoid tilting your head backward. That may make blood collect in the throat and cause gagging or coughing.   Use a nasal spray decongestant to help with a nosebleed as told by your health care provider.   Do not use petroleum jelly or mineral oil in your nose. It can drip into your lungs.  After a nosebleed:   Avoid blowing your nose or sniffing for a number of hours.   Avoid straining, lifting, or bending at the waist for several days. You may resume other normal activities as you are able.   Use saline spray or a humidifier as told by your health care provider.   Aspirinand blood thinners make bleeding more  likely. If you are prescribed these medicines and you suffer from nosebleeds:  ? Ask your health care provider if you should stop taking the medicines or if you should adjust the dose.  ? Do not stop taking medicines that your health care provider has recommended unless told by your health care provider.   If your nosebleed was caused by dry mucous membranes, use over-the-counter saline nasal spray or gel. This will keep the mucous membranes moist and allow them to heal. If you must use a lubricant:  ? Choose one that is water-soluble.  ? Use only as much as you need and use it only as often as needed.  ? Do not lie down until several hours after you use it.  Contact a health care provider if:   You have a fever.   You get nosebleeds often or more often than usual.   You bruise very easily.   You have a nosebleed from having something stuck in your nose.   You have bleeding in your mouth.   You vomit or cough up brown material.   You have a nosebleed after you start a new medicine.  Get help right away if:   You have a nosebleed after a fall or a head injury.     Your nosebleed does not go away after 20 minutes.   You feel dizzy or weak.   You have unusual bleeding from other parts of your body.   You have unusual bruising on other parts of your body.   You become sweaty.   You vomit blood.  This information is not intended to replace advice given to you by your health care provider. Make sure you discuss any questions you have with your health care provider.  Document Released: 05/05/2005 Document Revised: 11/30/2016 Document Reviewed: 02/10/2016  Elsevier Interactive Patient Education  2019 [employer]

## 2018-10-06 LAB — LIPID PANEL
CHOL/HDL RATIO: 4.6 ratio (ref 0.0–5.0)
Cholesterol, Total: 184 mg/dL (ref 100–199)
HDL: 40 mg/dL (ref >39)
LDL CALC: 113 mg/dL — AB (ref 0–99)
TRIGLYCERIDES: 154 mg/dL — AB (ref 0–149)
VLDL CHOLESTEROL CAL: 31 mg/dL (ref 5–40)

## 2018-10-06 LAB — MICROALBUMIN / CREATININE URINE RATIO
Creatinine, Urine: 108.1 mg/dL
Microalb/Creat Ratio: 13 mg/g creat (ref 0–29)
Microalbumin, Urine: 14.4 ug/mL

## 2018-10-13 ENCOUNTER — Telehealth: Payer: Self-pay

## 2018-10-13 NOTE — Telephone Encounter (Signed)
-----   Message from Hoy Register, MD sent at 10/06/2018  9:40 AM EST ----- Cholesterol has improved compared to previous labs; continue low cholesterol diet and lifestyle modifications.

## 2018-10-13 NOTE — Telephone Encounter (Signed)
Patient was called and informed of ab results. 

## 2018-10-16 MED FILL — AMOXICILLIN 500 MG CAPSULE: 500 | 7 days supply | Qty: 28 | Fill #0

## 2018-10-24 MED FILL — LANTUS SOLOSTAR 100 UNITS/M: 100 | 20 days supply | Qty: 3 | Fill #1 | Status: TO

## 2018-11-01 MED FILL — FUROSEMIDE 20 MG TABS: 20 | 30 days supply | Qty: 30 | Fill #0

## 2018-11-01 MED FILL — UNIFINE PENTIPS 31GX3/16": 31G X 5 MM | 90 days supply | Qty: 100 | Fill #0

## 2018-11-01 MED FILL — LISINOPRIL 20 MG TABLET: 20 | 30 days supply | Qty: 30 | Fill #0

## 2018-11-01 MED FILL — UNIFINE PENTIPS 31GX3/16: 31G X 5 MM | 90 days supply | Qty: 100 | Fill #0

## 2018-11-08 IMAGING — DX DG ABD PORTABLE 1V
1 series · 2 of 2 positions shown · non-contrast
Comparison: CT, 09/02/2017.  Radiographs, 08/28/2017.

CLINICAL DATA: Abdominal distention, but tightness has improved
over last 24 hr. Feels air moving proximal bowels only. Abdominal
surgery with colestomy 5 days ago.

EXAM:
PORTABLE ABDOMEN - 1 VIEW

[Series 1: abdomen · 0.14mm/px · 2 of 2 slices shown]
[im 1/2]
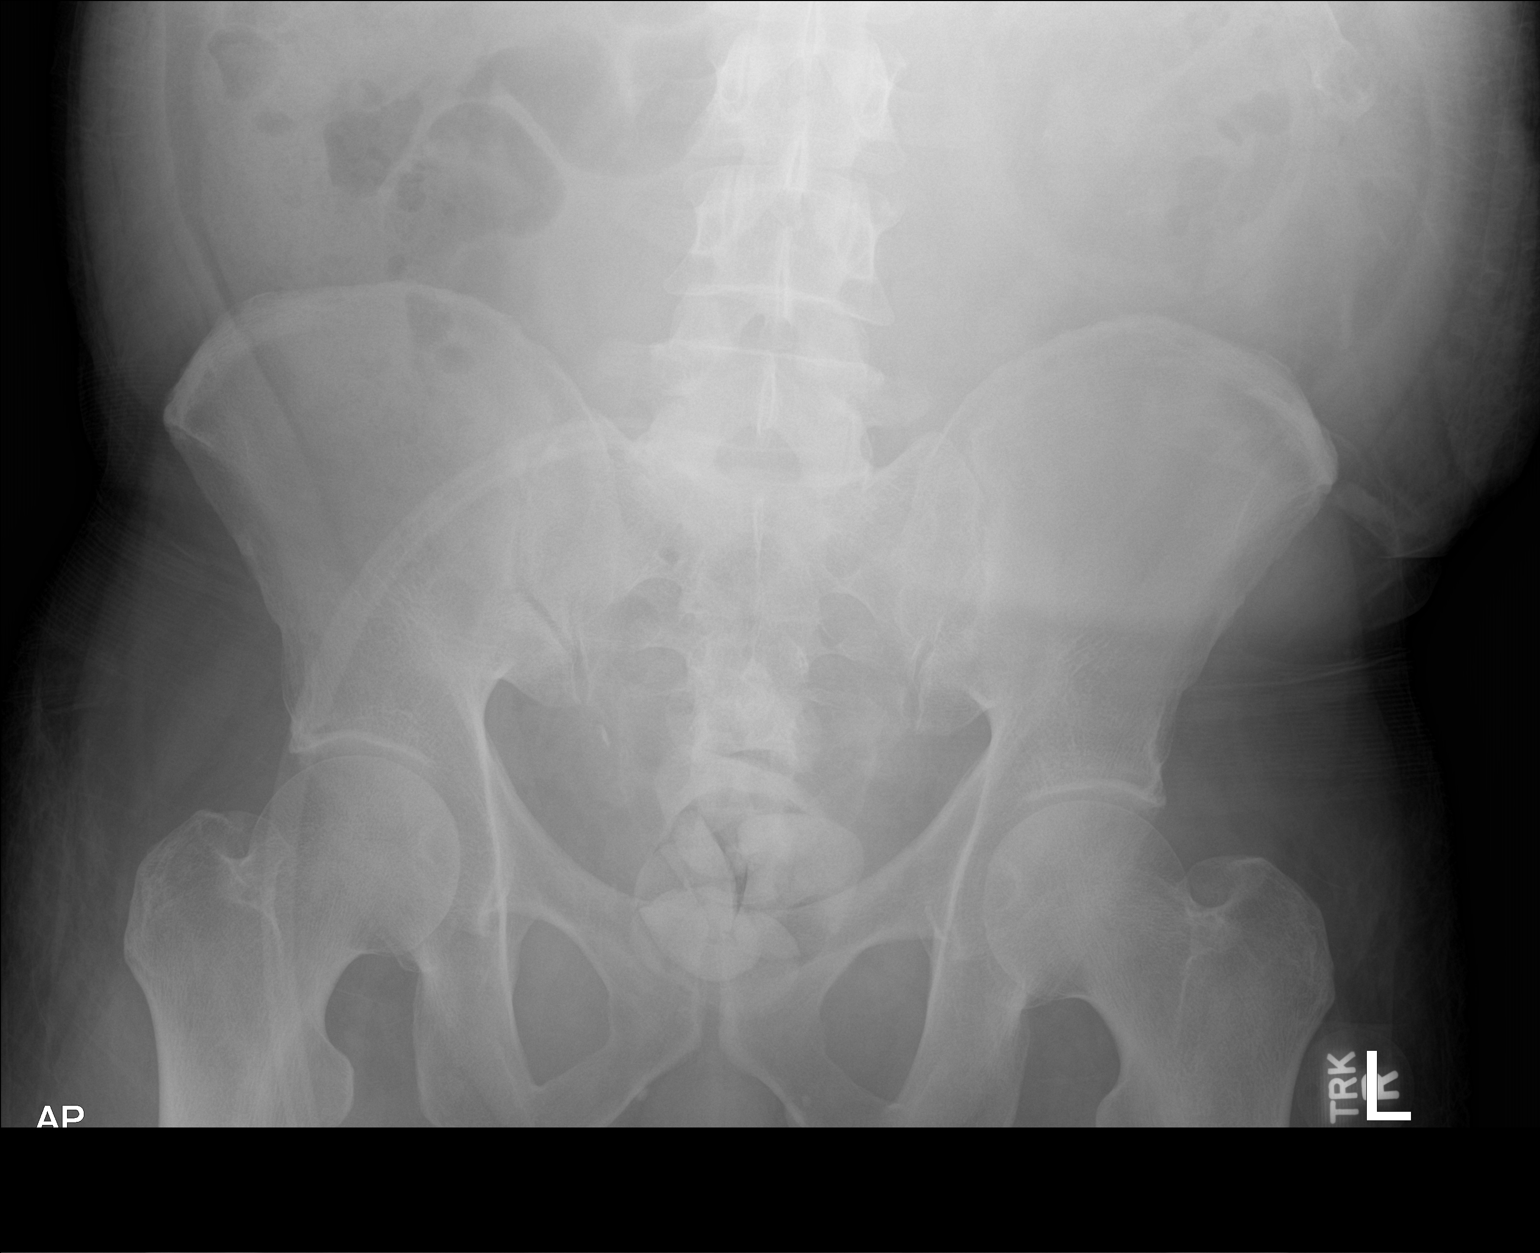
[im 2/2]
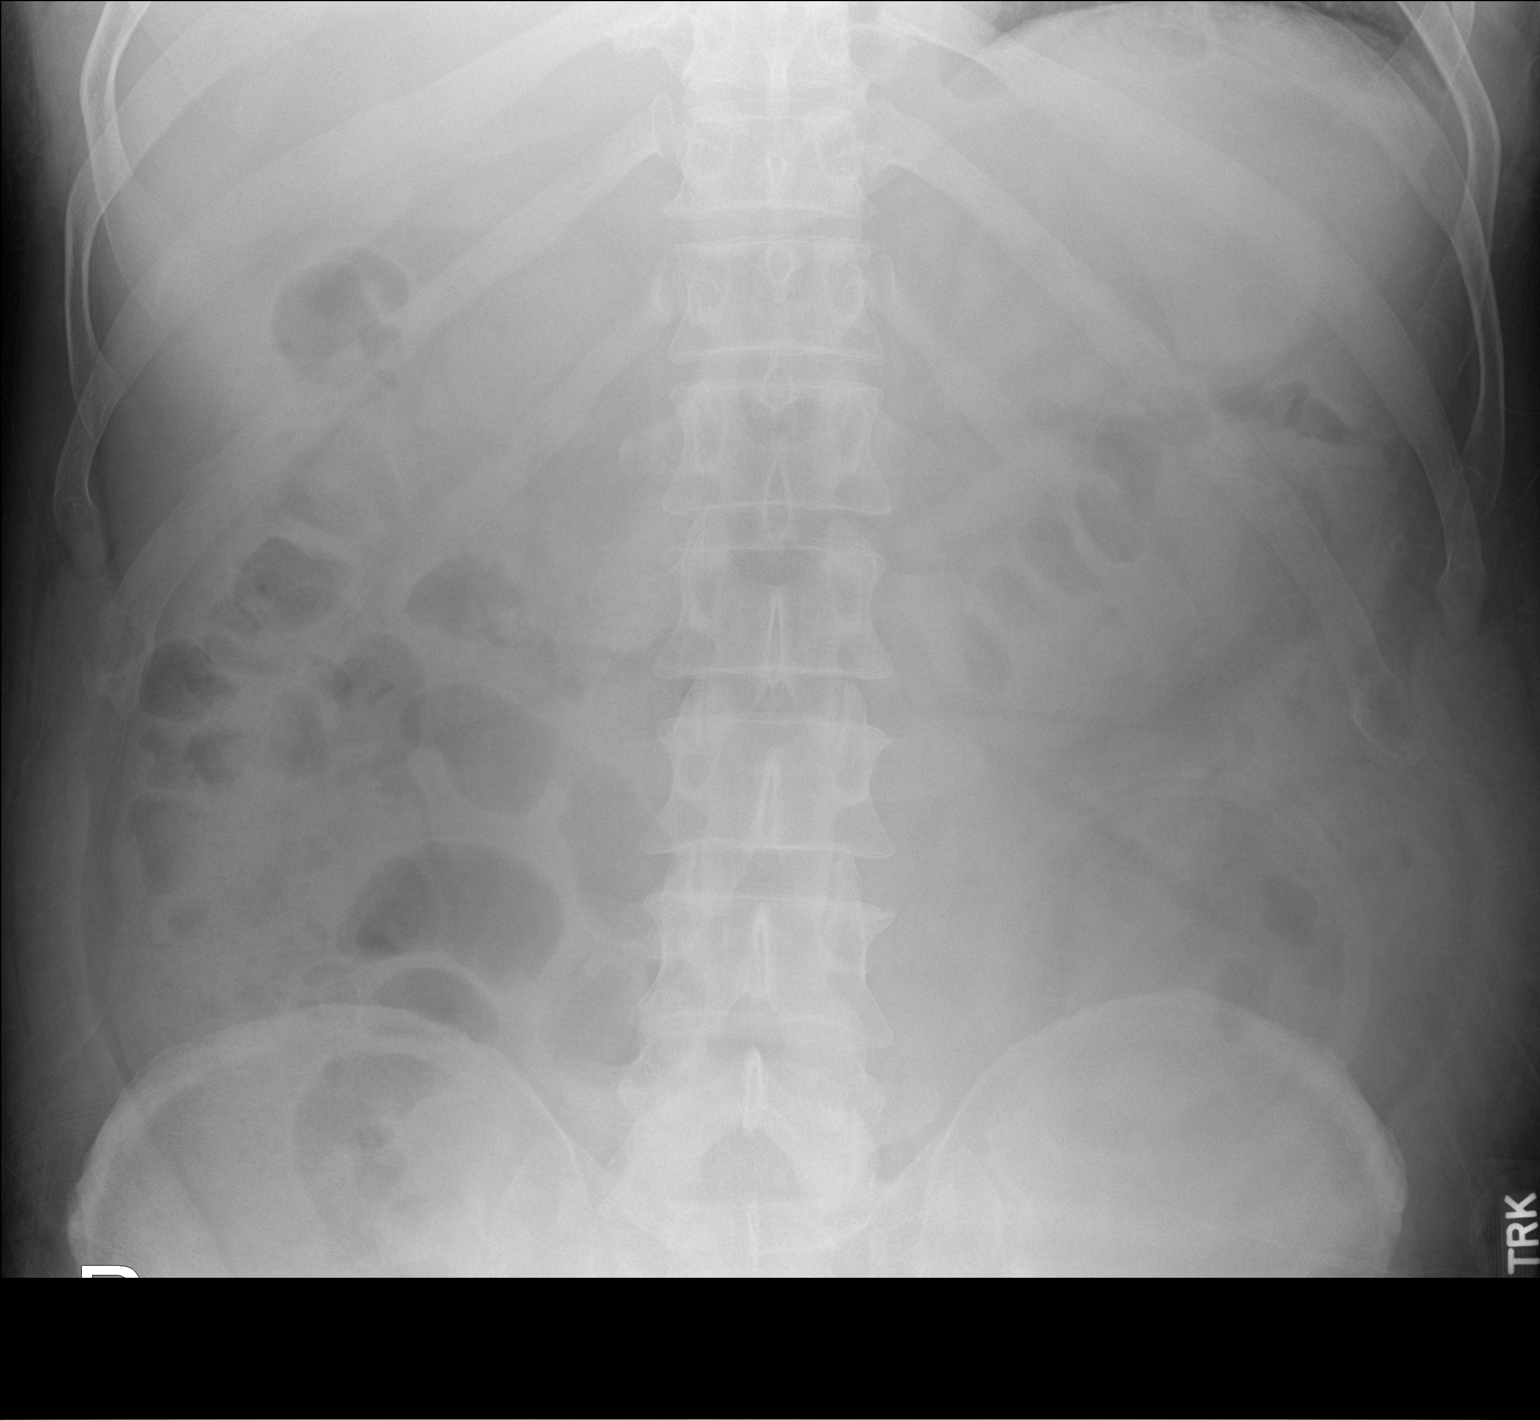

[2 of 2 positions shown; findings below may reference images not displayed]

FINDINGS: Normal bowel gas pattern.

Left lower quadrant colostomy.

No evidence of renal or ureteral stones. Soft tissues are otherwise
unremarkable.

No significant skeletal abnormality.
IMPRESSION: 1. No acute findings. No evidence of bowel obstruction or
significant generalized adynamic ileus.

## 2018-11-10 IMAGING — DX DG ABDOMEN 2V
3 series · 3 of 3 positions shown · non-contrast
Comparison: 09/09/2017; CT abdomen and pelvis - 09/02/2017

CLINICAL DATA: Ileus

EXAM:
ABDOMEN - 2 VIEW

[abdomen erect]
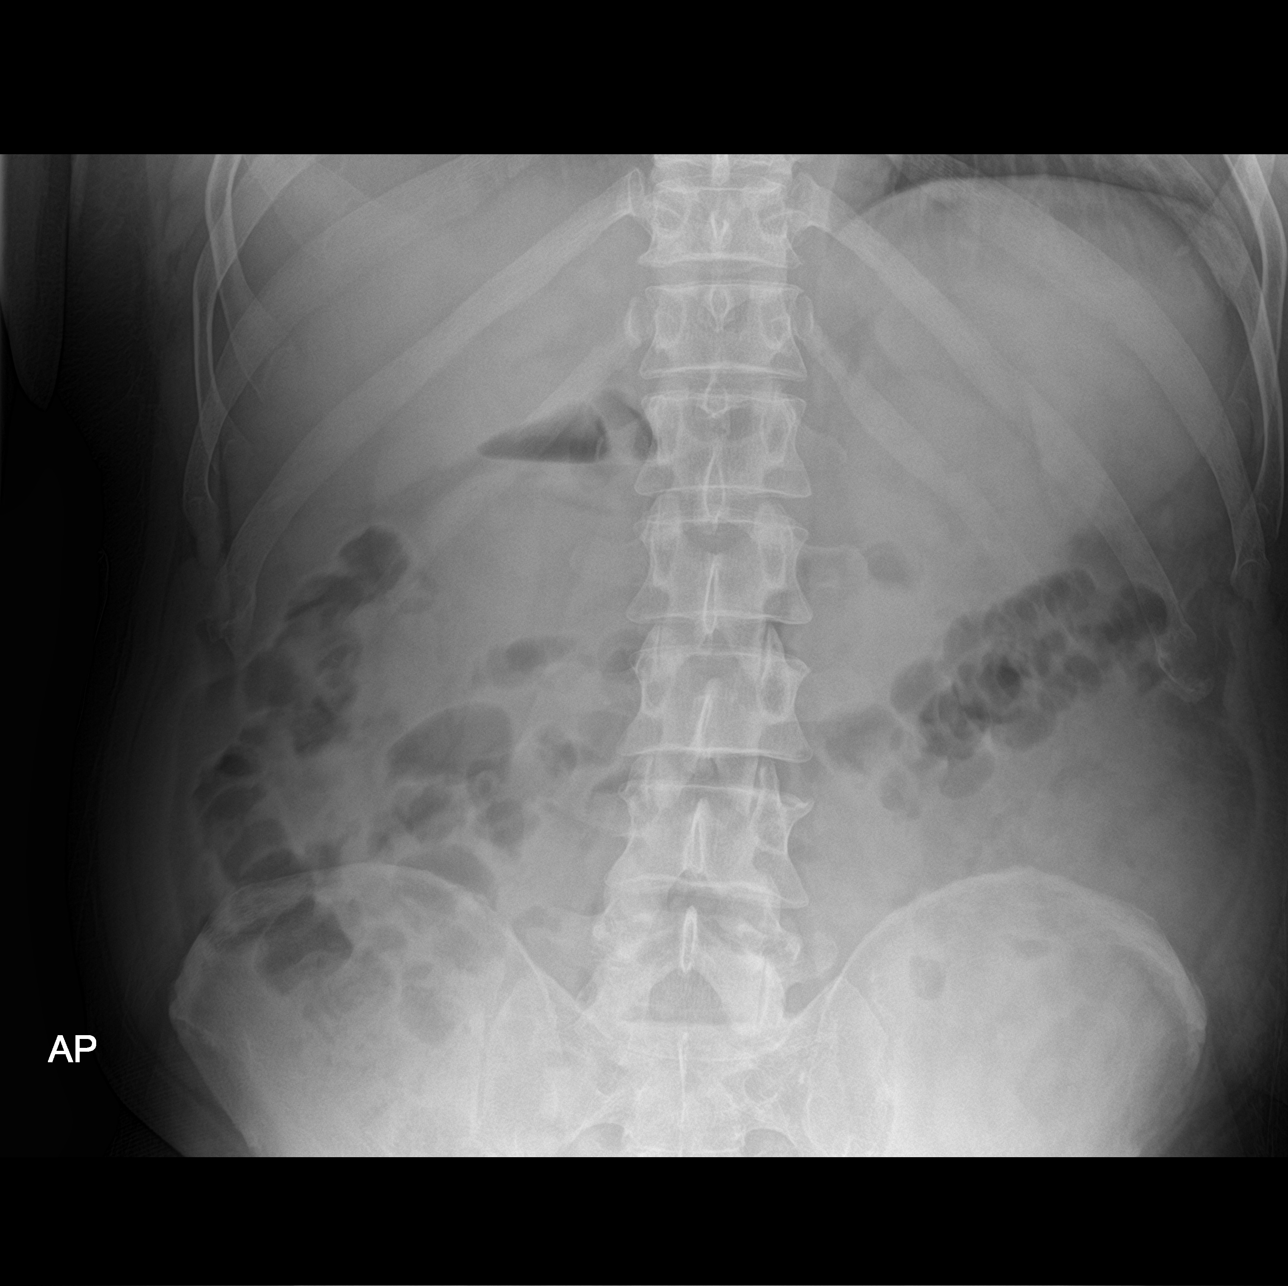

[abdomen supine (1 of 2)]
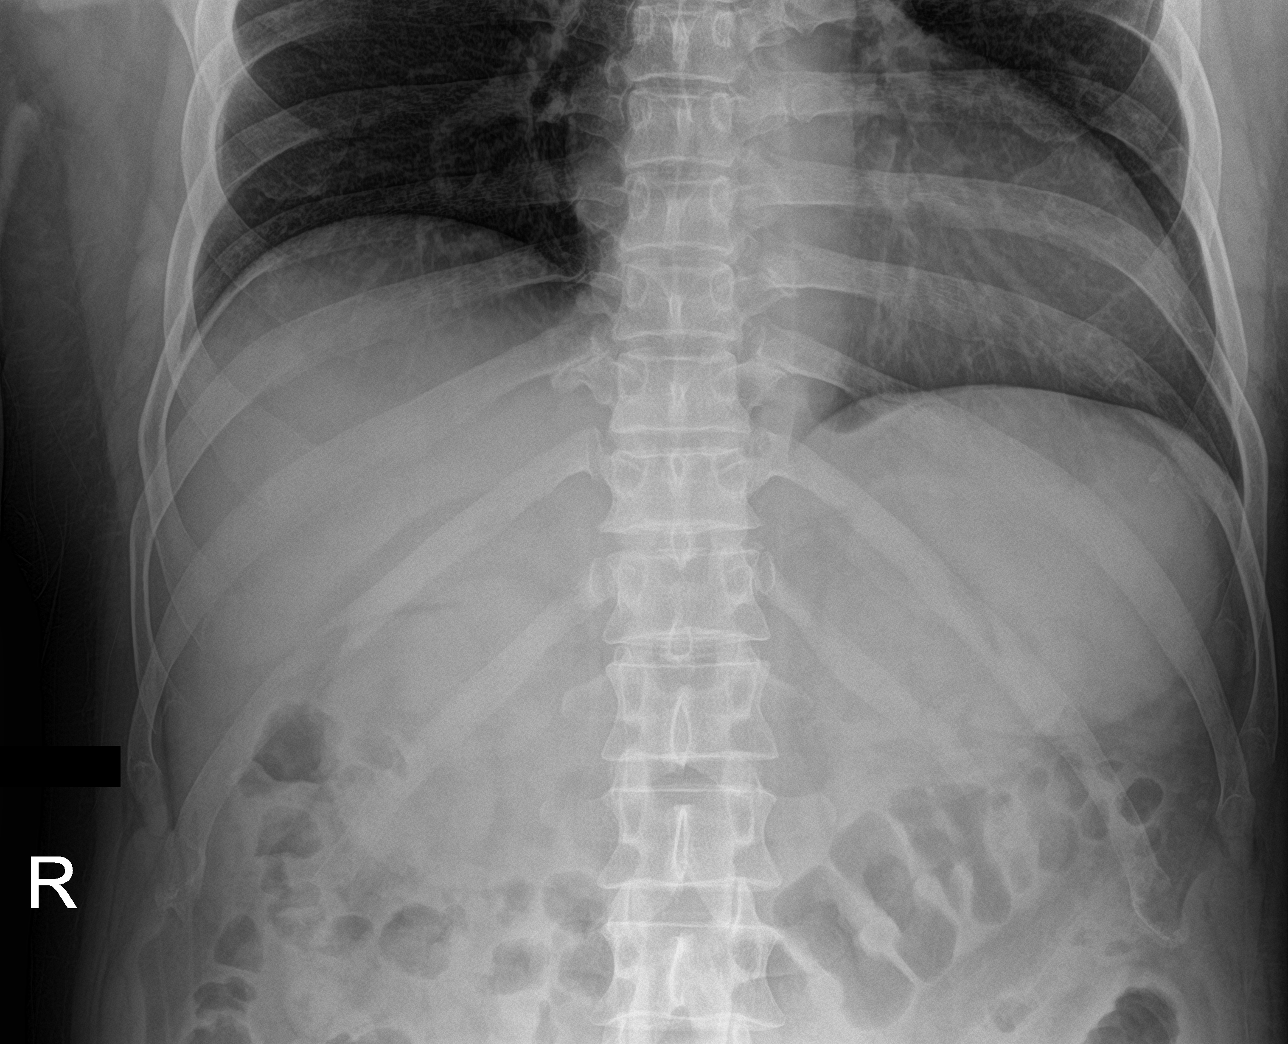

[abdomen supine (2 of 2)]
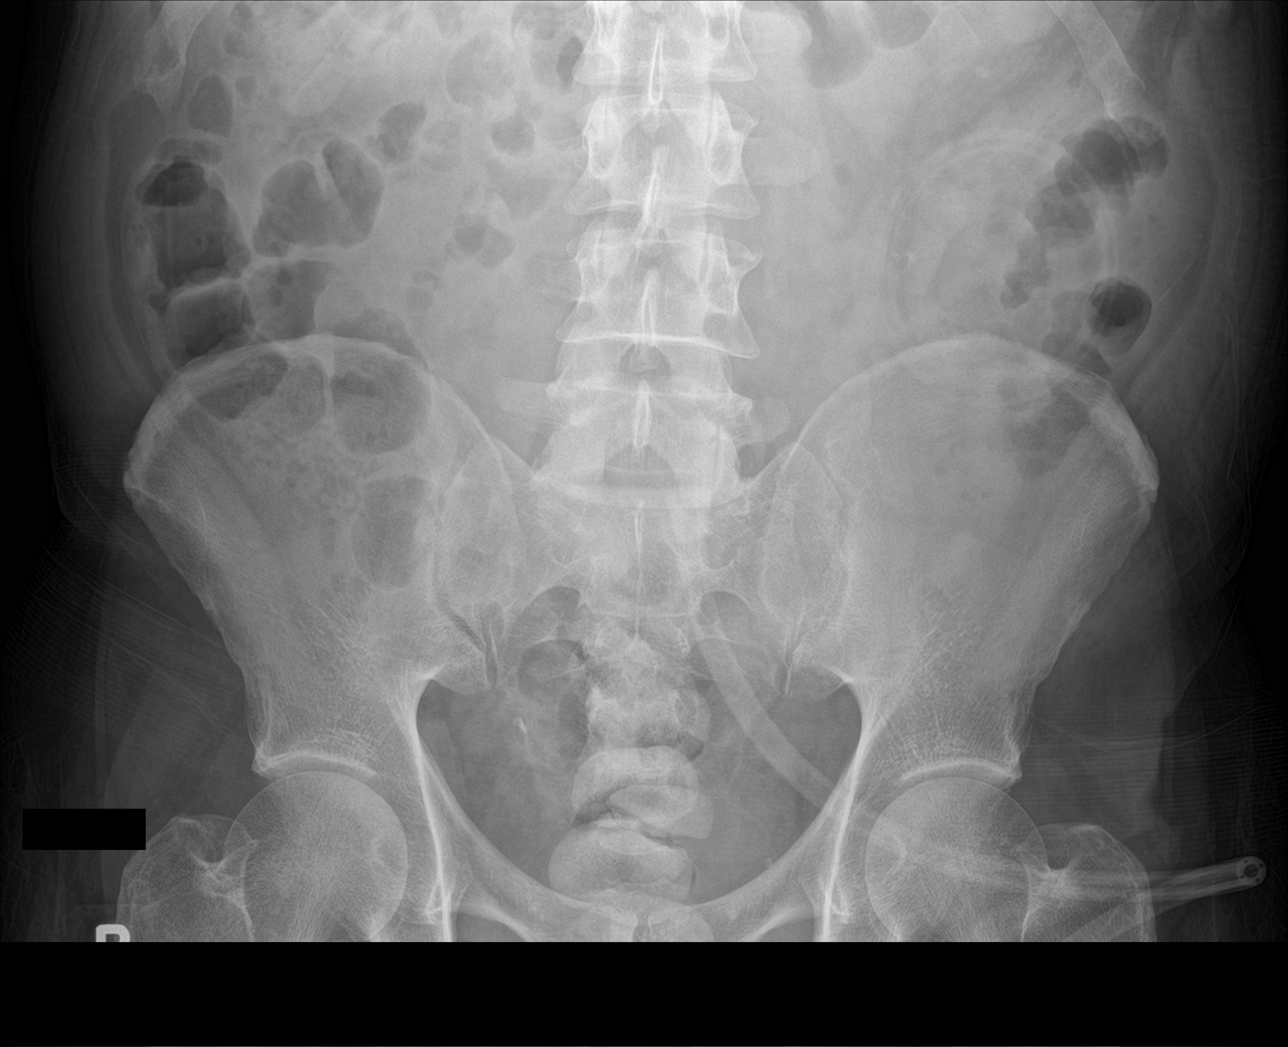

[3 of 3 positions shown; findings below may reference images not displayed]

FINDINGS: Nonobstructive bowel gas pattern.

Presumed ostomy overlies the left lower abdomen.

No pneumoperitoneum, pneumatosis or portal venous gas

No definitive abnormal intra-calcifications.

Limited visualization of lower thorax is normal. No acute osseus
abnormalities.
IMPRESSION: No evidence of enteric obstruction.

## 2018-11-29 MED FILL — LANTUS SOLOSTAR 100 UNITS/M: 100 | 20 days supply | Qty: 3 | Fill #0

## 2018-11-29 MED FILL — CETIRIZINE HCL 10 MG TABS: 10 | 30 days supply | Qty: 30 | Fill #0

## 2018-11-29 MED FILL — AMLODIPINE BESYLATE 10 MG T: 10 | 30 days supply | Qty: 30 | Fill #0

## 2018-12-16 DIAGNOSIS — R0602 Shortness of breath: Secondary | ICD-10-CM | POA: Diagnosis not present

## 2018-12-16 DIAGNOSIS — J189 Pneumonia, unspecified organism: Secondary | ICD-10-CM | POA: Insufficient documentation

## 2018-12-16 DIAGNOSIS — Z03818 Encounter for observation for suspected exposure to other biological agents ruled out: Secondary | ICD-10-CM | POA: Diagnosis not present

## 2018-12-16 DIAGNOSIS — J9601 Acute respiratory failure with hypoxia: Secondary | ICD-10-CM | POA: Diagnosis not present

## 2018-12-16 DIAGNOSIS — R069 Unspecified abnormalities of breathing: Secondary | ICD-10-CM | POA: Diagnosis not present

## 2018-12-16 DIAGNOSIS — R918 Other nonspecific abnormal finding of lung field: Secondary | ICD-10-CM | POA: Diagnosis not present

## 2018-12-16 DIAGNOSIS — R404 Transient alteration of awareness: Secondary | ICD-10-CM | POA: Diagnosis not present

## 2018-12-16 DIAGNOSIS — R0902 Hypoxemia: Secondary | ICD-10-CM | POA: Diagnosis not present

## 2018-12-16 DIAGNOSIS — R401 Stupor: Secondary | ICD-10-CM | POA: Diagnosis not present

## 2018-12-16 DIAGNOSIS — R000 Tachycardia, unspecified: Secondary | ICD-10-CM | POA: Diagnosis not present

## 2018-12-16 HISTORY — DX: Pneumonia, unspecified organism: J18.9

## 2018-12-17 DIAGNOSIS — R401 Stupor: Secondary | ICD-10-CM | POA: Diagnosis not present

## 2018-12-17 DIAGNOSIS — R0902 Hypoxemia: Secondary | ICD-10-CM | POA: Diagnosis not present

## 2018-12-18 DIAGNOSIS — R000 Tachycardia, unspecified: Secondary | ICD-10-CM | POA: Diagnosis not present

## 2018-12-19 DIAGNOSIS — F1910 Other psychoactive substance abuse, uncomplicated: Secondary | ICD-10-CM | POA: Insufficient documentation

## 2018-12-19 DIAGNOSIS — D509 Iron deficiency anemia, unspecified: Secondary | ICD-10-CM

## 2018-12-19 HISTORY — DX: Other psychoactive substance abuse, uncomplicated: F19.10

## 2018-12-19 HISTORY — DX: Iron deficiency anemia, unspecified: D50.9

## 2019-01-02 ENCOUNTER — Other Ambulatory Visit: Payer: Self-pay | Admitting: Family Medicine

## 2019-01-02 DIAGNOSIS — I5042 Chronic combined systolic (congestive) and diastolic (congestive) heart failure: Secondary | ICD-10-CM

## 2019-01-02 MED FILL — GABAPENTIN 300 MG CAPSULE: 300 | 30 days supply | Qty: 60 | Fill #0

## 2019-01-02 MED FILL — LANTUS SOLOSTAR 100 UNITS/M: 100 | 20 days supply | Qty: 3 | Fill #1

## 2019-01-02 MED FILL — AMLODIPINE BESYLATE 10 MG T: 10 | 30 days supply | Qty: 30 | Fill #1

## 2019-01-02 MED FILL — FUROSEMIDE 20 MG TABS: 20 | 30 days supply | Qty: 30 | Fill #0

## 2019-01-08 MED FILL — CARVEDILOL 6.25 MG TABLET: 6.25 | 30 days supply | Qty: 60 | Fill #0

## 2019-01-08 MED FILL — metFORMIN HCL 500 MG TABS: 500 | 30 days supply | Qty: 120 | Fill #0

## 2019-01-08 MED FILL — LISINOPRIL 20 MG TABLET: 20 | 30 days supply | Qty: 30 | Fill #1

## 2019-01-18 ENCOUNTER — Encounter (INDEPENDENT_AMBULATORY_CARE_PROVIDER_SITE_OTHER): Payer: Self-pay

## 2019-01-18 ENCOUNTER — Other Ambulatory Visit: Payer: Self-pay

## 2019-01-18 ENCOUNTER — Encounter: Payer: Self-pay | Admitting: Family Medicine

## 2019-01-18 ENCOUNTER — Ambulatory Visit: Payer: Medicaid Other | Attending: Family Medicine | Admitting: Family Medicine

## 2019-01-18 VITALS — BP 147/95 | HR 82 | Temp 98.2°F | Ht 69.0 in | Wt 205.0 lb

## 2019-01-18 DIAGNOSIS — F0631 Mood disorder due to known physiological condition with depressive features: Secondary | ICD-10-CM

## 2019-01-18 DIAGNOSIS — N528 Other male erectile dysfunction: Secondary | ICD-10-CM | POA: Diagnosis not present

## 2019-01-18 DIAGNOSIS — Z794 Long term (current) use of insulin: Secondary | ICD-10-CM | POA: Diagnosis not present

## 2019-01-18 DIAGNOSIS — Z7951 Long term (current) use of inhaled steroids: Secondary | ICD-10-CM | POA: Diagnosis not present

## 2019-01-18 DIAGNOSIS — Z833 Family history of diabetes mellitus: Secondary | ICD-10-CM | POA: Diagnosis not present

## 2019-01-18 DIAGNOSIS — Z7982 Long term (current) use of aspirin: Secondary | ICD-10-CM | POA: Insufficient documentation

## 2019-01-18 DIAGNOSIS — Z933 Colostomy status: Secondary | ICD-10-CM | POA: Insufficient documentation

## 2019-01-18 DIAGNOSIS — E785 Hyperlipidemia, unspecified: Secondary | ICD-10-CM | POA: Insufficient documentation

## 2019-01-18 DIAGNOSIS — F329 Major depressive disorder, single episode, unspecified: Secondary | ICD-10-CM | POA: Diagnosis not present

## 2019-01-18 DIAGNOSIS — Z9049 Acquired absence of other specified parts of digestive tract: Secondary | ICD-10-CM | POA: Diagnosis not present

## 2019-01-18 DIAGNOSIS — Z79899 Other long term (current) drug therapy: Secondary | ICD-10-CM | POA: Diagnosis not present

## 2019-01-18 DIAGNOSIS — I110 Hypertensive heart disease with heart failure: Secondary | ICD-10-CM | POA: Insufficient documentation

## 2019-01-18 DIAGNOSIS — Z8249 Family history of ischemic heart disease and other diseases of the circulatory system: Secondary | ICD-10-CM | POA: Diagnosis not present

## 2019-01-18 DIAGNOSIS — E1169 Type 2 diabetes mellitus with other specified complication: Secondary | ICD-10-CM | POA: Diagnosis not present

## 2019-01-18 DIAGNOSIS — Z91199 Patient's noncompliance with other medical treatment and regimen due to unspecified reason: Secondary | ICD-10-CM

## 2019-01-18 DIAGNOSIS — Z9119 Patient's noncompliance with other medical treatment and regimen: Secondary | ICD-10-CM | POA: Diagnosis not present

## 2019-01-18 DIAGNOSIS — E119 Type 2 diabetes mellitus without complications: Secondary | ICD-10-CM | POA: Diagnosis not present

## 2019-01-18 DIAGNOSIS — E1165 Type 2 diabetes mellitus with hyperglycemia: Secondary | ICD-10-CM | POA: Diagnosis not present

## 2019-01-18 DIAGNOSIS — I5042 Chronic combined systolic (congestive) and diastolic (congestive) heart failure: Secondary | ICD-10-CM | POA: Diagnosis not present

## 2019-01-18 DIAGNOSIS — I10 Essential (primary) hypertension: Secondary | ICD-10-CM | POA: Diagnosis not present

## 2019-01-18 LAB — POCT GLYCOSYLATED HEMOGLOBIN (HGB A1C): HbA1c, POC (controlled diabetic range): 12.7 % — AB (ref 0.0–7.0)

## 2019-01-18 LAB — GLUCOSE, POCT (MANUAL RESULT ENTRY): POC Glucose: 290 mg/dl — AB (ref 70–99)

## 2019-01-18 MED ORDER — GABAPENTIN 300 MG PO CAPS: 300.0000 mg | ORAL_CAPSULE | Freq: Two times a day (BID) | ORAL | 3 refills | Status: DC

## 2019-01-18 MED ORDER — FUROSEMIDE 40 MG PO TABS: 40.0000 mg | ORAL_TABLET | Freq: Every day | ORAL | 3 refills | Status: DC

## 2019-01-18 MED ORDER — AMLODIPINE BESYLATE 10 MG PO TABS: 10.0000 mg | ORAL_TABLET | Freq: Every day | ORAL | 3 refills | Status: DC

## 2019-01-18 MED ORDER — ATORVASTATIN CALCIUM 80 MG PO TABS: 80.0000 mg | ORAL_TABLET | Freq: Every day | ORAL | 3 refills | Status: DC

## 2019-01-18 MED ORDER — LANTUS SOLOSTAR 100 UNIT/ML ~~LOC~~ SOPN: 20.0000 [IU] | PEN_INJECTOR | Freq: Every day | SUBCUTANEOUS | 3 refills | Status: DC

## 2019-01-18 MED ORDER — LISINOPRIL 20 MG PO TABS: 20.0000 mg | ORAL_TABLET | Freq: Every day | ORAL | 3 refills | Status: DC

## 2019-01-18 MED ORDER — METFORMIN HCL 500 MG PO TABS: 1000.0000 mg | ORAL_TABLET | Freq: Two times a day (BID) | ORAL | 3 refills | Status: DC

## 2019-01-18 MED ORDER — SILDENAFIL CITRATE 50 MG PO TABS: 50.0000 mg | ORAL_TABLET | Freq: Every day | ORAL | 0 refills | Status: DC | PRN

## 2019-01-18 MED ORDER — CARVEDILOL 6.25 MG PO TABS: 6.2500 mg | ORAL_TABLET | Freq: Two times a day (BID) | ORAL | 3 refills | Status: DC

## 2019-01-18 MED FILL — ATORVASTATIN 80 MG TABLET: 80 | 30 days supply | Qty: 30 | Fill #0

## 2019-01-18 MED FILL — FUROSEMIDE 40 MG TAB: 40 | 30 days supply | Qty: 30 | Fill #0

## 2019-01-18 MED FILL — LANTUS SOLOSTAR 100 UNITS/M: 100 | 15 days supply | Qty: 3 | Fill #0

## 2019-01-18 NOTE — Patient Instructions (Signed)

## 2019-01-18 NOTE — Progress Notes (Signed)
Patient would like to get a dexcom meter.

## 2019-01-18 NOTE — Progress Notes (Signed)
Subjective:  Patient ID: Kenneth Rios, male    DOB: October 01, 1965  Age: 53 y.o. 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  He has not been compliant with his medications and misses his medications due to being irritated, he feels like a lab rat and states whatever happens happens.  He lacks motivation to take his medications. On further questioning he complains of inability to 'function' with his girlfriend.  He complains of dyspnea which occurs both at rest and with exertion and has 3 pillow orthopnea.  Denies pedal edema or weight gain and denies chest pain or dyspnea. Last month he was hospitalized at [facility] from 12/16/2018 through 12/20/2018 were he was treated for acute hypoxic respiratory failure secondary to aspiration pneumonia which required intubation, flash pulmonary edema and substance use disorder.  Urine had tested positive for cocaine. Echocardiogram had revealed improved EF of 60 to 65%. Currently does not follow with a cardiologist and informs me they do nothing for him.  Past Medical History:  Diagnosis Date   Anxiety    CHF (congestive heart failure) ([place])    "fluttering"   Depression    Diabetes mellitus without complication ([place])    Diverticulitis    GERD (gastroesophageal reflux disease)    Hyperlipidemia    Hypertension     Past Surgical History:  Procedure Laterality Date   CARDIAC CATHETERIZATION  03/30/2018   COLON RESECTION N/A 09/05/2017   Procedure: HARTMAN'S COLECTOMY AND COLOSTOMY;  Surgeon: Mickeal Skinner, MD;  Location: [facility];   Service: General;  Laterality: N/A;   COLON SURGERY     COLOSTOMY TAKEDOWN N/A 05/31/2018   Procedure: [place];  Surgeon: Kieth Brightly [name], MD;  Location: [facility];  Service: General;  Laterality: N/A;   INTRAVASCULAR PRESSURE WIRE/FFR STUDY N/A 03/30/2018   Procedure: INTRAVASCULAR PRESSURE WIRE/FFR STUDY;  Surgeon: Nelva Bush, MD;  Location: [facility];  Service: Cardiovascular;  Laterality: N/A;   LEFT HEART CATH AND CORONARY ANGIOGRAPHY N/A 03/30/2018   Procedure: LEFT HEART CATH AND CORONARY ANGIOGRAPHY;  Surgeon: Nelva Bush, MD;  Location: [facility];  Service: Cardiovascular;  Laterality: N/A;    Family History  Problem Relation Age of Onset   Heart disease Mother    Heart disease Sister    Diabetes Maternal Aunt    Heart disease Maternal Aunt    Diabetes Maternal Uncle    Sudden Cardiac Death Neg Hx     No Known Allergies  Outpatient Medications Prior to Visit  Medication Sig Dispense Refill   aspirin EC 81 MG tablet Take 81 mg by mouth daily.     blood glucose meter kit and supplies Dispense based on patient and insurance preference. Use up to four times daily as directed. (FOR ICD-10 E10.9, E11.9). 1 each 0   Blood Glucose Monitoring Suppl (ACCU-CHEK AVIVA) device Use as instructed 3 times daily. 1 each 0   cetirizine (ZYRTEC) 10 MG tablet Take 1 tablet (10 mg total) by mouth daily. 30 tablet 1   fluticasone (FLONASE) 50 MCG/ACT nasal spray Place 2 sprays into both  nostrils daily. 16 g 1   glucose blood (ACCU-CHEK AVIVA) test strip Use 3 times daily 100 each 12   Insulin Pen Needle 31G X 5 MM MISC Inject insulin via insulin pen once daily 90 each 3   Lancets (ACCU-CHEK SOFT TOUCH) lancets Use as instructed 3 times daily 100 each 12   amLODipine (NORVASC) 10 MG tablet Take 1 tablet (10 mg total) by mouth daily. 30 tablet 3   atorvastatin (LIPITOR) 80 MG tablet Take 1  tablet (80 mg total) by mouth daily. 30 tablet 3   carvedilol (COREG) 6.25 MG tablet Take 1 tablet (6.25 mg total) by mouth 2 (two) times daily with a meal. 60 tablet 3   furosemide (LASIX) 20 MG tablet TAKE 1 TABLET BY MOUTH DAILY. 30 tablet 0   gabapentin (NEURONTIN) 300 MG capsule Take 1 capsule (300 mg total) by mouth 2 (two) times daily. 60 capsule 3   Insulin Glargine (LANTUS SOLOSTAR) 100 UNIT/ML Solostar Pen Inject 15 Units into the skin daily. 15 mL 3   lisinopril (PRINIVIL,ZESTRIL) 20 MG tablet Take 1 tablet (20 mg total) by mouth daily. 30 tablet 3   metFORMIN (GLUCOPHAGE) 500 MG tablet Take 2 tablets (1,000 mg total) by mouth 2 (two) times daily with a meal. 120 tablet 3   oxyCODONE (OXY IR/ROXICODONE) 5 MG immediate release tablet Take 1 tablet (5 mg total) by mouth every 6 (six) hours as needed for breakthrough pain. (Patient not taking: Reported on 10/05/2018) 28 tablet 0   nitroGLYCERIN (NITROSTAT) 0.4 MG SL tablet Place 1 tablet (0.4 mg total) under the tongue every 5 (five) minutes as needed for chest pain. 25 tablet 11   No facility-administered medications prior to visit.      ROS Review of Systems  Constitutional: Negative for activity change and appetite change.  HENT: Negative for sinus pressure and sore throat.   Eyes: Negative for visual disturbance.  Respiratory: Positive for shortness of breath. Negative for cough and chest tightness.   Cardiovascular: Negative for chest pain and leg swelling.  Gastrointestinal: Negative for abdominal distention, abdominal pain, constipation and diarrhea.  Endocrine: Negative.   Genitourinary: Negative for dysuria.  Musculoskeletal: Negative for joint swelling and myalgias.  Skin: Negative for rash.  Allergic/Immunologic: Negative.   Neurological: Negative for weakness, light-headedness and numbness.  Psychiatric/Behavioral: Positive for dysphoric mood. Negative for suicidal ideas.    Objective:  BP (!) 147/95     Pulse 82    Temp 98.2 F (36.8 C) (Oral)    Ht _0  (1.753 m)    Wt 205 lb (93 kg)    SpO2 98%    BMI 30.27 kg/m   BP/Weight 01/18/2019 10/05/2018 36/46/8032  Systolic BP 122 482 500  Diastolic BP 95 96 90  Wt. (Lbs) 205 207.6 210.6  BMI 30.27 30.66 31.1      Physical Exam Constitutional:      Appearance: He is well-developed.  Neck:     Comments: No JVD Cardiovascular:     Rate and Rhythm: Normal rate.     Heart sounds: Normal heart sounds. No murmur.  Pulmonary:     Effort: Pulmonary effort is normal.     Breath sounds: Normal breath sounds. No wheezing or rales.  Chest:     Chest wall: No tenderness.  Abdominal:     General: Bowel sounds are normal. There is no distension.     Palpations: Abdomen is soft. There is no mass.     Tenderness: There  is no abdominal tenderness.  Musculoskeletal: Normal range of motion.     Right lower leg: No edema.     Left lower leg: No edema.  Neurological:     Mental Status: He is alert and oriented to person, place, and time.  Psychiatric:     Comments: Dysphoric mood     CMP Latest Ref Rng & Units 06/20/2018 06/05/2018 06/03/2018  Glucose 65 - 99 mg/dL 135(H) 190(H) 165(H)  BUN 6 - 24 mg/dL _0 Creatinine 0.76 - 1.27 mg/dL 0.99 0.81 0.91  Sodium 134 - 144 mmol/L 142 137 139  Potassium 3.5 - 5.2 mmol/L 4.6 3.3(L) 3.5  Chloride 96 - 106 mmol/L 102 105 105  CO2 20 - 29 mmol/L _1 Calcium 8.7 - 10.2 mg/dL 9.6 8.8(L) 8.3(L)  Total Protein 6.5 - 8.1 g/dL - - -  Total Bilirubin 0.3 - 1.2 mg/dL - - -  Alkaline Phos 38 - 126 U/L - - -  AST 15 - 41 U/L - - -  ALT 17 - 63 U/L - - -    Lipid Panel     Component Value Date/Time   CHOL 184 10/05/2018 1102   TRIG 154 (H) 10/05/2018 1102   HDL 40 10/05/2018 1102   CHOLHDL 4.6 10/05/2018 1102   CHOLHDL 6.1 03/31/2018 0227   VLDL UNABLE TO CALCULATE IF TRIGLYCERIDE OVER 400 mg/dL 03/31/2018 0227   LDLCALC 113 (H) 10/05/2018 1102    CBC    Component Value Date/Time    WBC 5.8 06/05/2018 0820   RBC 3.35 (L) 06/05/2018 0820   HGB 9.2 (L) 06/05/2018 0820   HGB 14.9 03/29/2018 1120   HCT 27.2 (L) 06/05/2018 0820   HCT 42.2 03/29/2018 1120   PLT 331 06/05/2018 0820   PLT 300 03/29/2018 1120   MCV 81.2 06/05/2018 0820   MCV 84 03/29/2018 1120   MCV 82 03/14/2014 0400   MCH 27.5 06/05/2018 0820   MCHC 33.8 06/05/2018 0820   RDW 12.5 06/05/2018 0820   RDW 13.7 03/29/2018 1120   RDW 13.5 03/14/2014 0400   LYMPHSABS 2.4 06/05/2018 0820   LYMPHSABS 0.9 (L) 03/14/2014 0400   MONOABS 0.4 06/05/2018 0820   MONOABS 0.1 (L) 03/14/2014 0400   EOSABS 0.4 06/05/2018 0820   EOSABS 0.0 03/14/2014 0400   BASOSABS 0.0 06/05/2018 0820   BASOSABS 0.0 03/14/2014 0400    Lab Results  Component Value Date   HGBA1C 12.7 (A) 01/18/2019    Assessment & Plan:   1. Diabetes mellitus with other complication ([place]) Uncontrolled with A1c of 12.7 Noncompliance playing a major role Increase Lantus to 20 units at bedtime and compliance with medications and lifestyle modifications emphasized Counseled on Diabetic diet, my plate method, 459 minutes of moderate intensity exercise/week Keep blood sugar logs with fasting goals of 80-120 mg/dl, random of less than 180 and in the event of sugars less than 60 mg/dl or greater than 400 mg/dl please notify the clinic ASAP. It is recommended that you undergo annual eye exams and annual foot exams. Pneumonia vaccine is recommended. - POCT glucose (manual entry) - POCT glycosylated hemoglobin (Hb A1C) - Microalbumin/Creatinine Ratio, Urine - CMP14+EGFR - Lipid panel - metFORMIN (GLUCOPHAGE) 500 MG tablet; Take 2 tablets (1,000 mg total) by mouth 2 (two) times daily with a meal.  Dispense: 120 tablet; Refill: 3 - atorvastatin (LIPITOR) 80 MG tablet; Take 1 tablet (80 mg total) by mouth daily.  Dispense: 30 tablet; Refill: 3  2. Chronic combined systolic and diastolic congestive heart failure (HCC) EF of 60 to 65% from echo performed  at [facility] which has improved from 45 to 50% in 08/2017 NYHA III He does not seem to be in fluid overload but is symptomatic For compliance could be contributing - Ambulatory referral to Cardiology - furosemide (LASIX) 40 MG tablet; Take 1 tablet (40 mg total) by mouth daily.  Dispense: 30 tablet; Refill: 3  3. Type 2 diabetes mellitus with hyperglycemia, with long-term current use of insulin ([place]) See #1 above - metFORMIN (GLUCOPHAGE) 500 MG tablet; Take 2 tablets (1,000 mg total) by mouth 2 (two) times daily with a meal.  Dispense: 120 tablet; Refill: 3 - Insulin Glargine (LANTUS SOLOSTAR) 100 UNIT/ML Solostar Pen; Inject 20 Units into the skin daily.  Dispense: 30 mL; Refill: 3  4. Essential hypertension Uncontrolled due to noncompliance Compliance with medications have been emphasized - amLODipine (NORVASC) 10 MG tablet; Take 1 tablet (10 mg total) by mouth daily.  Dispense: 30 tablet; Refill: 3 - lisinopril (ZESTRIL) 20 MG tablet; Take 1 tablet (20 mg total) by mouth daily.  Dispense: 30 tablet; Refill: 3 - carvedilol (COREG) 6.25 MG tablet; Take 1 tablet (6.25 mg total) by mouth 2 (two) times daily with a meal.  Dispense: 60 tablet; Refill: 3  5. Other male erectile dysfunction This is a source of his depression We will commence on Viagra and advised not to use in combination with nitrates - sildenafil (VIAGRA) 50 MG tablet; Take 1 tablet (50 mg total) by mouth daily as needed for erectile dysfunction. At least 24 hrs between doses.  Don't take with nitroglycerin  Dispense: 10 tablet; Refill: 0  6. 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 At least 24 hors between doses.    Dispense:  10 tablet    Refill:  0   metFORMIN (GLUCOPHAGE) 500 MG tablet    Sig: Take 2 tablets (1,000 mg total) by mouth 2 (two) times daily with a meal.    Dispense:  120 tablet    Refill:  3   amLODipine (NORVASC) 10 MG tablet    Sig: Take 1 tablet (10 mg total) by mouth daily.    Dispense:  30 tablet    Refill:  3   Insulin Glargine (LANTUS SOLOSTAR) 100 UNIT/ML Solostar Pen    Sig: Inject 20 Units into the skin daily.    Dispense:  30 mL    Refill:  3   lisinopril (ZESTRIL) 20 MG tablet    Sig: Take 1 tablet (20 mg total) by mouth daily.    Dispense:  30 tablet    Refill:  3   gabapentin (NEURONTIN) 300 MG capsule    Sig: Take 1 capsule (300 mg total) by mouth 2 (two) times daily.    Dispense:  60 capsule    Refill:  3   carvedilol (COREG)  6.25 MG tablet    Sig: Take 1 tablet (6.25 mg total) by mouth 2 (two) times daily with a meal.    Dispense:  60 tablet    Refill:  3   atorvastatin (LIPITOR) 80 MG tablet    Sig: Take 1 tablet (80 mg total) by mouth daily.    Dispense:  30 tablet    Refill:  3   furosemide (LASIX) 40 MG tablet    Sig: Take 1 tablet (40 mg total) by mouth daily.    Dispense:  30 tablet    Refill:  3   sildenafil (VIAGRA) 50 MG tablet    Sig: Take 1 tablet (50 mg total) by mouth daily as needed for erectile dysfunction. At least 24 hrs between doses.  Don't take with nitroglycerin    Dispense:  10 tablet    Refill:  0    Follow-up: Return in about 3 months (around 04/20/2019) for medical conditions.       Charlott Rakes, MD, FAAFP. [facility] [place] [facility] [place], [place]   01/18/2019, 10:19 AM

## 2019-01-19 LAB — CMP14+EGFR
ALT: 30 IU/L (ref 0–44)
AST: 16 IU/L (ref 0–40)
Albumin/Globulin Ratio: 1.5 (ref 1.2–2.2)
Albumin: 4.1 g/dL (ref 3.8–4.9)
Alkaline Phosphatase: 97 IU/L (ref 39–117)
BUN/Creatinine Ratio: 12 (ref 9–20)
BUN: 15 mg/dL (ref 6–24)
Bilirubin Total: 0.4 mg/dL (ref 0.0–1.2)
CO2: 21 mmol/L (ref 20–29)
Calcium: 8.8 mg/dL (ref 8.7–10.2)
Chloride: 102 mmol/L (ref 96–106)
Creatinine, Ser: 1.23 mg/dL (ref 0.76–1.27)
GFR calc Af Amer: 77 mL/min/{1.73_m2} (ref >59)
GFR calc non Af Amer: 67 mL/min/{1.73_m2} (ref >59)
Globulin, Total: 2.7 g/dL (ref 1.5–4.5)
Glucose: 103 mg/dL — ABNORMAL HIGH (ref 65–99)
Potassium: 4 mmol/L (ref 3.5–5.2)
Sodium: 138 mmol/L (ref 134–144)
Total Protein: 6.8 g/dL (ref 6.0–8.5)

## 2019-01-19 LAB — LIPID PANEL
Chol/HDL Ratio: 3.2 ratio (ref 0.0–5.0)
Cholesterol, Total: 155 mg/dL (ref 100–199)
HDL: 48 mg/dL (ref >39)
LDL Calculated: 92 mg/dL (ref 0–99)
Triglycerides: 75 mg/dL (ref 0–149)
VLDL Cholesterol Cal: 15 mg/dL (ref 5–40)

## 2019-01-22 MED FILL — SILDENAFIL CITRATE 50 MG TA: 50 | 30 days supply | Qty: 10 | Fill #0

## 2019-01-31 ENCOUNTER — Telehealth: Payer: Self-pay

## 2019-01-31 NOTE — Telephone Encounter (Signed)
Patient name and DOB has been verified Patient was informed of lab results. Patient had no questions.  

## 2019-01-31 NOTE — Telephone Encounter (Signed)
-----   Message from Charlott Rakes, MD sent at 01/19/2019 10:59 AM EDT ----- Please inform the patient that labs are normal. Thank you.

## 2019-02-19 MED FILL — ATORVASTATIN 80 MG TABLET: 80 | 30 days supply | Qty: 30 | Fill #1

## 2019-02-19 MED FILL — LANTUS SOLOSTAR 100 UNITS/M: 100 | 15 days supply | Qty: 3 | Fill #1

## 2019-02-19 MED FILL — UNIFINE PENTIPS 31GX3/16: 31G X 5 MM | 90 days supply | Qty: 100 | Fill #1

## 2019-02-19 MED FILL — AMLODIPINE BESYLATE 10 MG T: 10 | 30 days supply | Qty: 30 | Fill #2

## 2019-02-19 MED FILL — FUROSEMIDE 40 MG TAB: 40 | 30 days supply | Qty: 30 | Fill #1

## 2019-02-19 MED FILL — LISINOPRIL 20 MG TABLET: 20 | 30 days supply | Qty: 30 | Fill #2

## 2019-03-03 ENCOUNTER — Other Ambulatory Visit: Payer: Self-pay

## 2019-03-03 ENCOUNTER — Encounter (HOSPITAL_COMMUNITY): Payer: Self-pay

## 2019-03-03 ENCOUNTER — Ambulatory Visit (HOSPITAL_COMMUNITY)
Admission: EM | Admit: 2019-03-03 | Discharge: 2019-03-03 | Disposition: A | Discharge status: Home / Self Care | Payer: Medicaid Other | Attending: Emergency Medicine | Admitting: Emergency Medicine

## 2019-03-03 DIAGNOSIS — R21 Rash and other nonspecific skin eruption: Secondary | ICD-10-CM

## 2019-03-03 DIAGNOSIS — L739 Follicular disorder, unspecified: Secondary | ICD-10-CM | POA: Diagnosis not present

## 2019-03-03 MED ORDER — MUPIROCIN 2 % EX OINT: 1.0000 "application " | TOPICAL_OINTMENT | Freq: Two times a day (BID) | CUTANEOUS | 0 refills | Status: DC

## 2019-03-03 MED ORDER — DOXYCYCLINE HYCLATE 100 MG PO CAPS: 100.0000 mg | ORAL_CAPSULE | Freq: Two times a day (BID) | ORAL | 0 refills | Status: DC

## 2019-03-03 MED ORDER — DOXYCYCLINE HYCLATE 100 MG PO CAPS: 100.0000 mg | ORAL_CAPSULE | Freq: Two times a day (BID) | ORAL | 0 refills | Status: AC

## 2019-03-03 MED ORDER — DIPHENHYDRAMINE HCL 2 % EX CREA: TOPICAL_CREAM | Freq: Three times a day (TID) | CUTANEOUS | 0 refills | Status: DC | PRN

## 2019-03-03 NOTE — Discharge Instructions (Addendum)
Rash concerning for a bacterial infection given skin peeling and drainage- possible folliculitis, cellulitis  May pick up doxycycline (1 tablet twice a day x 10 days) and Bactroban (apply to rash twice daily) from [facility] or use printed prescriptions Avoid scratching/picking to avoid further skin breakdown May try applying diphenhydramine/benadryl cream to help with itching  Please follow up if rash not resolving or wrosening

## 2019-03-03 NOTE — ED Triage Notes (Signed)
Pt states he has a rash on his face. Pt states its starts off his skin itches and burns then his skin peels off. This has been going on for 4 days.

## 2019-03-04 NOTE — ED Provider Notes (Signed)
[facility]    CSN: [id number] Arrival date & time: 03/03/19  1640      History   Chief Complaint Chief Complaint  Patient presents with  . Rash    HPI Kenneth Rios is a 53 y.o. male history of hypertension, hyperlipidemia, heart failure, prolonged QT, DM type II, presenting today for evaluation of a rash.  Patient states that over the past 4 to 5 days he has had some itching to his skin.  Here he will itch the area and then notices that his skin peels off.  This again has become raw and has associated burning and discomfort.  He notes that he has a drainage from these areas.  He denies any new hygiene or facial products.  Initially started to his left eyebrow and has moved to his chin region.  He denies any new exposures.  Denies history of similar.  HPI  Past Medical History:  Diagnosis Date  . Anxiety   . CHF (congestive heart failure) ([place])    "fluttering"  . Depression   . Diabetes mellitus without complication ([place])   . Diverticulitis   . GERD (gastroesophageal reflux disease)   . Hyperlipidemia   . Hypertension     Patient Active Problem List   Diagnosis Date Noted  . Accelerated hypertension 06/05/2018  . Diverticulitis large intestine 05/31/2018  . (HFpEF) heart failure with preserved ejection fraction ([place]) 03/30/2018  . Chest tightness 03/29/2018  . Unstable angina ([place]) 03/29/2018  . Palpitations 03/29/2018  . Angina pectoris ([place]) 03/29/2018  . Anxiety and depression 02/06/2018  . Prolonged Q-T interval on ECG 02/06/2018  . S/P colostomy ([place]) 09/16/2017  . Peritonitis ([place]) 09/02/2017  . Acute diverticulitis 08/29/2017  . Asthma with status asthmaticus   . Diabetes mellitus without complication ([place]) 79/15/0569  . CHF (congestive heart failure) ([place]) 08/24/2017  . Hypertension 08/24/2017  . Acute viral bronchitis 08/24/2017  . Hypertensive urgency 08/24/2017    Past Surgical History:  Procedure Laterality Date  . CARDIAC  CATHETERIZATION  03/30/2018  . COLON RESECTION N/A 09/05/2017   Procedure: HARTMAN'S COLECTOMY AND COLOSTOMY;  Surgeon: Kieth Brightly [name], MD;  Location: [facility];  Service: General;  Laterality: N/A;  . COLON SURGERY    . COLOSTOMY TAKEDOWN N/A 05/31/2018   Procedure: LAPAROSCOPIC COLOSTOMY REVERSAL COLORECTAL ANASTOMOSIS ERAS PATHWAY;  Surgeon: [name], [name], MD;  Location: [facility];  Service: General;  Laterality: N/A;  . INTRAVASCULAR PRESSURE WIRE/FFR STUDY N/A 03/30/2018   Procedure: INTRAVASCULAR PRESSURE WIRE/FFR STUDY;  Surgeon: Nelva Bush, MD;  Location: [facility];  Service: Cardiovascular;  Laterality: N/A;  . LEFT HEART CATH AND CORONARY ANGIOGRAPHY N/A 03/30/2018   Procedure: LEFT HEART CATH AND CORONARY ANGIOGRAPHY;  Surgeon: Nelva Bush, MD;  Location: [facility];  Service: Cardiovascular;  Laterality: N/A;       Home Medications    Prior to Admission medications   Medication Sig Start Date End Date Taking? Authorizing Provider  amLODipine (NORVASC) 10 MG tablet Take 1 tablet (10 mg total) by mouth daily. 01/18/19   Charlott Rakes, MD  aspirin EC 81 MG tablet Take 81 mg by mouth daily.    [provider]  atorvastatin (LIPITOR) 80 MG tablet Take 1 tablet (80 mg total) by mouth daily. 01/18/19   Charlott Rakes, MD  blood glucose meter kit and supplies Dispense based on patient and insurance preference. Use up to four times daily as directed. (FOR ICD-10 E10.9, E11.9). 06/07/18  Patrecia Pour, MD  Blood Glucose Monitoring Suppl (ACCU-CHEK AVIVA) device Use as instructed 3 times daily. 06/20/18   Charlott Rakes, MD  carvedilol (COREG) 6.25 MG tablet Take 1 tablet (6.25 mg total) by mouth 2 (two) times daily with a meal. 01/18/19   Charlott Rakes, MD  cetirizine (ZYRTEC) 10 MG tablet Take 1 tablet (10 mg total) by mouth daily. 10/05/18   Charlott Rakes, MD  diphenhydrAMINE (BENADRYL) 2 % cream Apply topically 3 (three) times daily as  needed for itching. 03/03/19   Wieters, Hallie C, PA-C  doxycycline (VIBRAMYCIN) 100 MG capsule Take 1 capsule (100 mg total) by mouth 2 (two) times daily for 10 days. 03/03/19 03/13/19  Wieters, Hallie C, PA-C  fluticasone (FLONASE) 50 MCG/ACT nasal spray Place 2 sprays into both nostrils daily. 10/05/18   Charlott Rakes, MD  furosemide (LASIX) 40 MG tablet Take 1 tablet (40 mg total) by mouth daily. 01/18/19   Charlott Rakes, MD  gabapentin (NEURONTIN) 300 MG capsule Take 1 capsule (300 mg total) by mouth 2 (two) times daily. 01/18/19   Charlott Rakes, MD  glucose blood (ACCU-CHEK AVIVA) test strip Use 3 times daily 06/20/18   Charlott Rakes, MD  Insulin Glargine (LANTUS SOLOSTAR) 100 UNIT/ML Solostar Pen Inject 20 Units into the skin daily. 01/18/19   Charlott Rakes, MD  Insulin Pen Needle 31G X 5 MM MISC Inject insulin via insulin pen once daily 10/05/18   Charlott Rakes, MD  Lancets (ACCU-CHEK SOFT TOUCH) lancets Use as instructed 3 times daily 06/20/18   Charlott Rakes, MD  lisinopril (ZESTRIL) 20 MG tablet Take 1 tablet (20 mg total) by mouth daily. 01/18/19   Charlott Rakes, MD  metFORMIN (GLUCOPHAGE) 500 MG tablet Take 2 tablets (1,000 mg total) by mouth 2 (two) times daily with a meal. 01/18/19   Charlott Rakes, MD  mupirocin ointment (BACTROBAN) 2 % Apply 1 application topically 2 (two) times daily. 03/03/19   Wieters, Hallie C, PA-C  oxyCODONE (OXY IR/ROXICODONE) 5 MG immediate release tablet Take 1 tablet (5 mg total) by mouth every 6 (six) hours as needed for breakthrough pain. Patient not taking: Reported on 10/05/2018 06/07/18   Greer Pickerel, MD  sildenafil (VIAGRA) 50 MG tablet Take 1 tablet (50 mg total) by mouth daily as needed for erectile dysfunction. At least 24 hrs between doses.  Don't take with nitroglycerin 01/18/19   Charlott Rakes, MD    Family History Family History  Problem Relation Age of Onset  . Heart disease Mother   . Heart disease Sister   . Diabetes Maternal  Aunt   . Heart disease Maternal Aunt   . Diabetes Maternal Uncle   . Sudden Cardiac Death Neg Hx     Social History Social History   Tobacco Use  . Smoking status: Former Research scientist (life sciences)  . Smokeless tobacco: Never Used  Substance Use Topics  . Alcohol use: Yes    Alcohol/week: 6.0 standard drinks    Types: 6 Cans of beer per week    Comment: on the weekends  . Drug use: No     Allergies   Patient has no known allergies.   Review of Systems Review of Systems  Constitutional: Negative for fatigue and fever.  Eyes: Negative for redness, itching and visual disturbance.  Respiratory: Negative for shortness of breath.   Cardiovascular: Negative for chest pain and leg swelling.  Gastrointestinal: Negative for nausea and vomiting.  Musculoskeletal: Negative for arthralgias and myalgias.  Skin: Positive for color change and  rash. Negative for wound.  Neurological: Negative for dizziness, syncope, weakness, light-headedness and headaches.     Physical Exam Triage Vital Signs ED Triage Vitals  Enc Vitals Group     BP 03/03/19 1722 125/78     Pulse Rate 03/03/19 1722 84     Resp 03/03/19 1722 16     Temp 03/03/19 1722 97.9 F (36.6 C)     Temp Source 03/03/19 1722 Oral     SpO2 03/03/19 1722 100 %     Weight 03/03/19 1720 225 lb (102.1 kg)     Height --      Head Circumference --      Peak Flow --      Pain Score 03/03/19 1720 9     Pain Loc --      Pain Edu? --      Excl. in [place]? --    No data found.  Updated Vital Signs BP 125/78 (BP Location: Right Arm)   Pulse 84   Temp 97.9 F (36.6 C) (Oral)   Resp 16   Wt 225 lb (102.1 kg)   SpO2 100%   BMI 33.23 kg/m   Visual Acuity Right Eye Distance:   Left Eye Distance:   Bilateral Distance:    Right Eye Near:   Left Eye Near:    Bilateral Near:     Physical Exam Vitals signs and nursing note reviewed.  Constitutional:      Appearance: He is well-developed.     Comments: No acute distress  HENT:     Head:  Normocephalic and atraumatic.     Nose: Nose normal.     Mouth/Throat:     Comments: No lesions on oral mucosa Eyes:     Conjunctiva/sclera: Conjunctivae normal.  Neck:     Musculoskeletal: Neck supple.  Cardiovascular:     Rate and Rhythm: Normal rate.  Pulmonary:     Effort: Pulmonary effort is normal. No respiratory distress.  Abdominal:     General: There is no distension.  Musculoskeletal: Normal range of motion.  Skin:    General: Skin is warm and dry.     Comments: See picture below, left eyebrow with larger area of raw exposed skin, appears slightly oozing, smaller lesions to chin  No lesions noted elsewhere  Neurological:     Mental Status: He is alert and oriented to person, place, and time.          UC Treatments / Results  Labs (all labs ordered are listed, but only abnormal results are displayed) Labs Reviewed - No data to display  EKG   Radiology No results found.  Procedures Procedures (including critical care time)  Medications Ordered in UC Medications - No data to display  Initial Impression / Assessment and Plan / UC Course  I have reviewed the triage vital signs and the nursing notes.  Pertinent labs & imaging results that were available during my care of the patient were reviewed by me and considered in my medical decision making (see chart for details).     Patient with rash/overall skin areas, possible bacterial infection as trigger, will cover with doxycycline.  Possible picking/itching in nature causing breakdown in skin.  Does not appear fungal at this time.  Bactroban twice daily, avoid itching.Discussed strict return precautions. Patient verbalized understanding and is agreeable with plan.  Final Clinical Impressions(s) / UC Diagnoses   Final diagnoses:  Rash and nonspecific skin eruption  Folliculitis     Discharge Instructions  Rash concerning for a bacterial infection given skin peeling and drainage- possible  folliculitis, cellulitis  May pick up doxycycline (1 tablet twice a day x 10 days) and Bactroban (apply to rash twice daily) from [facility] or use printed prescriptions Avoid scratching/picking to avoid further skin breakdown May try applying diphenhydramine/benadryl cream to help with itching  Please follow up if rash not resolving or wrosening   ED Prescriptions    Medication Sig Dispense Auth. Provider   doxycycline (VIBRAMYCIN) 100 MG capsule  (Status: Discontinued) Take 1 capsule (100 mg total) by mouth 2 (two) times daily for 10 days. 20 capsule Wieters, Hallie C, PA-C   doxycycline (VIBRAMYCIN) 100 MG capsule Take 1 capsule (100 mg total) by mouth 2 (two) times daily for 10 days. 20 capsule Wieters, Hallie C, PA-C   diphenhydrAMINE (BENADRYL) 2 % cream Apply topically 3 (three) times daily as needed for itching. 30 g Wieters, Hallie C, PA-C   mupirocin ointment (BACTROBAN) 2 %  (Status: Discontinued) Apply 1 application topically 2 (two) times daily. 30 g Wieters, Hallie C, PA-C   mupirocin ointment (BACTROBAN) 2 % Apply 1 application topically 2 (two) times daily. 30 g Wieters, Gorman C, PA-C     Controlled Substance Prescriptions [place] Controlled Substance Registry consulted? Not Applicable   Janith Lima, [place] 03/04/19 1000

## 2019-03-05 MED FILL — DOXYCYCLINE HYCLATE 100 MG: 100 | 10 days supply | Qty: 20 | Fill #0

## 2019-03-05 MED FILL — MUPIROCIN 2% OINTMENT: 2 | 10 days supply | Qty: 22 | Fill #0

## 2019-03-19 MED FILL — LANTUS SOLOSTAR 100 UNITS/M: 100 | 15 days supply | Qty: 3 | Fill #2

## 2019-03-19 MED FILL — metFORMIN HCL 500 MG TABS: 500 | 30 days supply | Qty: 120 | Fill #1

## 2019-04-06 MED FILL — FUROSEMIDE 40 MG TAB: 40 | 30 days supply | Qty: 30 | Fill #2

## 2019-04-06 MED FILL — LANTUS SOLOSTAR 100 UNITS/M: 100 | 30 days supply | Qty: 6 | Fill #3

## 2019-04-06 MED FILL — GABAPENTIN 300 MG CAPSULE: 300 | 30 days supply | Qty: 60 | Fill #1

## 2019-04-12 MED FILL — CARVEDILOL 6.25 MG TABLET: 6.25 | 90 days supply | Qty: 180 | Fill #0

## 2019-04-12 MED FILL — LISINOPRIL 20 MG TABLET: 20 | 90 days supply | Qty: 90 | Fill #0

## 2019-04-12 MED FILL — AMLODIPINE BESYLATE 10 MG T: 10 | 90 days supply | Qty: 90 | Fill #0

## 2019-04-25 ENCOUNTER — Ambulatory Visit: Payer: Medicaid Other | Admitting: Family Medicine

## 2019-04-26 ENCOUNTER — Ambulatory Visit: Payer: Medicaid Other | Admitting: Family Medicine

## 2019-04-30 ENCOUNTER — Other Ambulatory Visit: Payer: Self-pay | Admitting: Family Medicine

## 2019-04-30 DIAGNOSIS — R040 Epistaxis: Secondary | ICD-10-CM

## 2019-04-30 MED FILL — ATORVASTATIN 80 MG TABLET: 80 | 30 days supply | Qty: 30 | Fill #0

## 2019-04-30 MED FILL — FUROSEMIDE 40 MG TAB: 40 | 30 days supply | Qty: 30 | Fill #0

## 2019-05-02 MED FILL — CETIRIZINE HCL 10 MG TABS: 10 | 30 days supply | Qty: 30 | Fill #0

## 2019-05-04 MED FILL — GABAPENTIN 300 MG CAPSULE: 300 | 30 days supply | Qty: 60 | Fill #0

## 2019-05-14 ENCOUNTER — Encounter: Payer: Self-pay | Admitting: Family Medicine

## 2019-05-14 ENCOUNTER — Other Ambulatory Visit: Payer: Self-pay

## 2019-05-14 ENCOUNTER — Ambulatory Visit: Payer: Medicaid Other | Attending: Family Medicine | Admitting: Family Medicine

## 2019-05-14 VITALS — BP 149/93 | HR 87 | Temp 98.2°F | Ht 69.0 in | Wt 205.0 lb

## 2019-05-14 DIAGNOSIS — Z8249 Family history of ischemic heart disease and other diseases of the circulatory system: Secondary | ICD-10-CM | POA: Insufficient documentation

## 2019-05-14 DIAGNOSIS — Z7982 Long term (current) use of aspirin: Secondary | ICD-10-CM | POA: Diagnosis not present

## 2019-05-14 DIAGNOSIS — Z79899 Other long term (current) drug therapy: Secondary | ICD-10-CM | POA: Insufficient documentation

## 2019-05-14 DIAGNOSIS — I10 Essential (primary) hypertension: Secondary | ICD-10-CM | POA: Diagnosis not present

## 2019-05-14 DIAGNOSIS — E785 Hyperlipidemia, unspecified: Secondary | ICD-10-CM | POA: Insufficient documentation

## 2019-05-14 DIAGNOSIS — I110 Hypertensive heart disease with heart failure: Secondary | ICD-10-CM | POA: Insufficient documentation

## 2019-05-14 DIAGNOSIS — Z9111 Patient's noncompliance with dietary regimen: Secondary | ICD-10-CM | POA: Diagnosis not present

## 2019-05-14 DIAGNOSIS — Z23 Encounter for immunization: Secondary | ICD-10-CM

## 2019-05-14 DIAGNOSIS — R3912 Poor urinary stream: Secondary | ICD-10-CM | POA: Diagnosis not present

## 2019-05-14 DIAGNOSIS — Z794 Long term (current) use of insulin: Secondary | ICD-10-CM | POA: Insufficient documentation

## 2019-05-14 DIAGNOSIS — I5042 Chronic combined systolic (congestive) and diastolic (congestive) heart failure: Secondary | ICD-10-CM | POA: Diagnosis not present

## 2019-05-14 DIAGNOSIS — Z833 Family history of diabetes mellitus: Secondary | ICD-10-CM | POA: Insufficient documentation

## 2019-05-14 DIAGNOSIS — K219 Gastro-esophageal reflux disease without esophagitis: Secondary | ICD-10-CM | POA: Insufficient documentation

## 2019-05-14 DIAGNOSIS — F419 Anxiety disorder, unspecified: Secondary | ICD-10-CM | POA: Insufficient documentation

## 2019-05-14 DIAGNOSIS — N528 Other male erectile dysfunction: Secondary | ICD-10-CM | POA: Insufficient documentation

## 2019-05-14 DIAGNOSIS — E1165 Type 2 diabetes mellitus with hyperglycemia: Secondary | ICD-10-CM | POA: Insufficient documentation

## 2019-05-14 DIAGNOSIS — R399 Unspecified symptoms and signs involving the genitourinary system: Secondary | ICD-10-CM

## 2019-05-14 LAB — POCT GLYCOSYLATED HEMOGLOBIN (HGB A1C): HbA1c, POC (controlled diabetic range): 12.2 % — AB (ref 0.0–7.0)

## 2019-05-14 LAB — GLUCOSE, POCT (MANUAL RESULT ENTRY): POC Glucose: 364 mg/dl — AB (ref 70–99)

## 2019-05-14 MED ORDER — LISINOPRIL 20 MG PO TABS: 20.0000 mg | ORAL_TABLET | Freq: Every day | ORAL | 3 refills | Status: DC

## 2019-05-14 MED ORDER — FUROSEMIDE 40 MG PO TABS: 40.0000 mg | ORAL_TABLET | Freq: Every day | ORAL | 3 refills | Status: DC

## 2019-05-14 MED ORDER — CARVEDILOL 6.25 MG PO TABS: 6.2500 mg | ORAL_TABLET | Freq: Two times a day (BID) | ORAL | 3 refills | Status: DC

## 2019-05-14 MED ORDER — METFORMIN HCL 500 MG PO TABS: 1000.0000 mg | ORAL_TABLET | Freq: Two times a day (BID) | ORAL | 3 refills | Status: DC

## 2019-05-14 MED ORDER — ATORVASTATIN CALCIUM 80 MG PO TABS: 80.0000 mg | ORAL_TABLET | Freq: Every day | ORAL | 3 refills | Status: DC

## 2019-05-14 MED ORDER — TAMSULOSIN HCL 0.4 MG PO CAPS: 0.4000 mg | ORAL_CAPSULE | Freq: Every day | ORAL | 3 refills | Status: DC

## 2019-05-14 MED ORDER — GABAPENTIN 300 MG PO CAPS: 300.0000 mg | ORAL_CAPSULE | Freq: Two times a day (BID) | ORAL | 3 refills | Status: DC

## 2019-05-14 MED ORDER — AMLODIPINE BESYLATE 10 MG PO TABS: 10.0000 mg | ORAL_TABLET | Freq: Every day | ORAL | 3 refills | Status: DC

## 2019-05-14 MED ORDER — LANTUS SOLOSTAR 100 UNIT/ML ~~LOC~~ SOPN: 30.0000 [IU] | PEN_INJECTOR | Freq: Every day | SUBCUTANEOUS | 3 refills | Status: DC

## 2019-05-14 MED FILL — TAMSULOSIN HCL 0.4 MG CAP: 0.4 | 30 days supply | Qty: 30 | Fill #0

## 2019-05-14 NOTE — Patient Instructions (Signed)

## 2019-05-15 NOTE — Progress Notes (Signed)
Subjective:  Patient ID: Kenneth Rios, male    DOB: 07/15/1966  Age: 53 y.o. 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 He endorses compliance with his diabetic medications but A1c remains elevated at 12.2.  He has not been compliant with a diabetic diet and continues to eat hamburgers and juices but states for every 1 cup of juice he drinks he drinks 1 cup of water. His blood sugar is elevated in clinic today and he just had fries and a burger from a fast food restaurant.  He complains of erectile dysfunction and the fact that Viagra has been ineffective.  He also complains of a weak urinary stream and having to strain but denies urinary frequency.  He is requesting referral to urologist. With regards to CHF he denies pedal edema, worsening dyspnea or orthopnea. He is compliant with his statin and antihypertensives and denies adverse effects from his medications.  Past Medical History:  Diagnosis Date  . Anxiety   . CHF (congestive heart failure) ([place])    "fluttering"  . Depression   . Diabetes mellitus without complication ([place])   . Diverticulitis   . GERD (gastroesophageal reflux disease)   . Hyperlipidemia   . Hypertension     Past Surgical History:  Procedure Laterality Date  . CARDIAC CATHETERIZATION  03/30/2018  . COLON RESECTION N/A 09/05/2017   Procedure: HARTMAN'S COLECTOMY AND COLOSTOMY;  Surgeon: Kieth Brightly [name], MD;  Location: [facility];  Service: General;  Laterality: N/A;  . COLON SURGERY    . COLOSTOMY TAKEDOWN N/A 05/31/2018   Procedure: LAPAROSCOPIC COLOSTOMY REVERSAL COLORECTAL  ANASTOMOSIS ERAS PATHWAY;  Surgeon: [name], [name], MD;  Location: [facility];  Service: General;  Laterality: N/A;  . INTRAVASCULAR PRESSURE WIRE/FFR STUDY N/A 03/30/2018   Procedure: INTRAVASCULAR PRESSURE WIRE/FFR STUDY;  Surgeon: Nelva Bush, MD;  Location: [facility];  Service: Cardiovascular;  Laterality: N/A;  . LEFT HEART CATH AND CORONARY ANGIOGRAPHY N/A 03/30/2018   Procedure: LEFT HEART CATH AND CORONARY ANGIOGRAPHY;  Surgeon: Nelva Bush, MD;  Location: [facility];  Service: Cardiovascular;  Laterality: N/A;    Family History  Problem Relation Age of Onset  . Heart disease Mother   . Heart disease Sister   . Diabetes Maternal Aunt   . Heart disease Maternal Aunt   . Diabetes Maternal Uncle   . Sudden Cardiac Death Neg Hx     No Known Allergies  Outpatient Medications Prior to Visit  Medication Sig Dispense Refill  . aspirin EC 81 MG tablet Take 81 mg by mouth daily.    . blood glucose meter kit and supplies Dispense based on patient and insurance preference. Use up to four times daily as directed. (FOR ICD-10 E10.9, E11.9). 1 each 0  . Blood Glucose Monitoring Suppl (ACCU-CHEK AVIVA) device Use as instructed 3 times daily. 1 each 0  . cetirizine (ZYRTEC) 10 MG tablet TAKE 1 TABLET (10 MG TOTAL) BY MOUTH DAILY. 30 tablet 0  . fluticasone (FLONASE) 50 MCG/ACT nasal spray Place 2 sprays into both nostrils daily. 16 g 1  . glucose blood (ACCU-CHEK AVIVA) test strip Use 3 times  daily 100 each 12  . Insulin Pen Needle 31G X 5 MM MISC Inject insulin via insulin pen once daily 90 each 3  . Lancets (ACCU-CHEK SOFT TOUCH) lancets Use as instructed 3 times daily 100 each 12  . sildenafil (VIAGRA) 50 MG tablet Take 1 tablet (50 mg total) by mouth daily as needed for erectile dysfunction. At least 24 hrs between doses.  Don't take with nitroglycerin 10 tablet 0  . amLODipine (NORVASC) 10 MG tablet Take 1 tablet (10 mg total) by mouth daily. 30 tablet 3  .  atorvastatin (LIPITOR) 80 MG tablet Take 1 tablet (80 mg total) by mouth daily. 30 tablet 3  . carvedilol (COREG) 6.25 MG tablet Take 1 tablet (6.25 mg total) by mouth 2 (two) times daily with a meal. 60 tablet 3  . furosemide (LASIX) 40 MG tablet Take 1 tablet (40 mg total) by mouth daily. 30 tablet 3  . gabapentin (NEURONTIN) 300 MG capsule Take 1 capsule (300 mg total) by mouth 2 (two) times daily. 60 capsule 3  . Insulin Glargine (LANTUS SOLOSTAR) 100 UNIT/ML Solostar Pen Inject 20 Units into the skin daily. 30 mL 3  . lisinopril (ZESTRIL) 20 MG tablet Take 1 tablet (20 mg total) by mouth daily. 30 tablet 3  . metFORMIN (GLUCOPHAGE) 500 MG tablet Take 2 tablets (1,000 mg total) by mouth 2 (two) times daily with a meal. 120 tablet 3  . diphenhydrAMINE (BENADRYL) 2 % cream Apply topically 3 (three) times daily as needed for itching. (Patient not taking: Reported on 05/14/2019) 30 g 0  . mupirocin ointment (BACTROBAN) 2 % Apply 1 application topically 2 (two) times daily. (Patient not taking: Reported on 05/14/2019) 30 g 0  . oxyCODONE (OXY IR/ROXICODONE) 5 MG immediate release tablet Take 1 tablet (5 mg total) by mouth every 6 (six) hours as needed for breakthrough pain. (Patient not taking: Reported on 10/05/2018) 28 tablet 0   No facility-administered medications prior to visit.      ROS Review of Systems General: negative for fever, weight loss, appetite change Eyes: no visual symptoms. ENT: no ear symptoms, no sinus tenderness, no nasal congestion or sore throat. Neck: no pain  Respiratory: no wheezing, shortness of breath, cough Cardiovascular: no chest pain, no dyspnea on exertion, no pedal edema, no orthopnea. Gastrointestinal: no abdominal pain, no diarrhea, no constipation Genito-Urinary: see HPI Hematologic: no bruising Endocrine: no cold or heat intolerance Neurological: no headaches, no seizures, no tremors Musculoskeletal: no joint pains, no joint swelling Skin: no pruritus,  no rash. Psychological: no depression, no anxiety,    Objective:  BP (!) 149/93   Pulse 87   Temp 98.2 F (36.8 C) (Oral)   Ht _0  (1.753 m)   Wt 205 lb (93 kg)   SpO2 100%   BMI 30.27 kg/m   BP/Weight 05/14/2019 03/03/2019 3/79/4327  Systolic BP 614 709 295  Diastolic BP 93 78 95  Wt. (Lbs) 205 225 205  BMI 30.27 33.23 30.27      Physical Exam Constitutional: normal appearing,  Eyes: PERRLA HEENT: Head is atraumatic, normal sinuses, normal oropharynx, normal appearing tonsils and palate, tympanic membrane is normal bilaterally. Neck: normal range of motion, no thyromegaly, no JVD Cardiovascular: normal rate and rhythm, normal heart sounds, no murmurs, rub or gallop, no pedal edema Respiratory: Normal breath sounds, clear to auscultation bilaterally, no wheezes, no rales, no rhonchi Abdomen: soft, not tender to palpation, normal bowel sounds, no enlarged organs. Healed surgical scars Musculoskeletal: Full  ROM, no tenderness in joints Skin: warm and dry, no lesions. Neurological: alert, oriented x3, cranial nerves I-XII grossly intact , normal motor strength, normal sensation. Psychological: normal mood.   CMP Latest Ref Rng & Units 01/18/2019 06/20/2018 06/05/2018  Glucose 65 - 99 mg/dL 103(H) 135(H) 190(H)  BUN 6 - 24 mg/dL _0 Creatinine 0.76 - 1.27 mg/dL 1.23 0.99 0.81  Sodium 134 - 144 mmol/L 138 142 137  Potassium 3.5 - 5.2 mmol/L 4.0 4.6 3.3(L)  Chloride 96 - 106 mmol/L 102 102 105  CO2 20 - 29 mmol/L _1 Calcium 8.7 - 10.2 mg/dL 8.8 9.6 8.8(L)  Total Protein 6.0 - 8.5 g/dL 6.8 - -  Total Bilirubin 0.0 - 1.2 mg/dL 0.4 - -  Alkaline Phos 39 - 117 IU/L 97 - -  AST 0 - 40 IU/L 16 - -  ALT 0 - 44 IU/L 30 - -    Lipid Panel     Component Value Date/Time   CHOL 155 01/18/2019 0957   TRIG 75 01/18/2019 0957   HDL 48 01/18/2019 0957   CHOLHDL 3.2 01/18/2019 0957   CHOLHDL 6.1 03/31/2018 0227   VLDL UNABLE TO CALCULATE IF TRIGLYCERIDE OVER 400  mg/dL 03/31/2018 0227   LDLCALC 92 01/18/2019 0957    CBC    Component Value Date/Time   WBC 5.8 06/05/2018 0820   RBC 3.35 (L) 06/05/2018 0820   HGB 9.2 (L) 06/05/2018 0820   HGB 14.9 03/29/2018 1120   HCT 27.2 (L) 06/05/2018 0820   HCT 42.2 03/29/2018 1120   PLT 331 06/05/2018 0820   PLT 300 03/29/2018 1120   MCV 81.2 06/05/2018 0820   MCV 84 03/29/2018 1120   MCV 82 03/14/2014 0400   MCH 27.5 06/05/2018 0820   MCHC 33.8 06/05/2018 0820   RDW 12.5 06/05/2018 0820   RDW 13.7 03/29/2018 1120   RDW 13.5 03/14/2014 0400   LYMPHSABS 2.4 06/05/2018 0820   LYMPHSABS 0.9 (L) 03/14/2014 0400   MONOABS 0.4 06/05/2018 0820   MONOABS 0.1 (L) 03/14/2014 0400   EOSABS 0.4 06/05/2018 0820   EOSABS 0.0 03/14/2014 0400   BASOSABS 0.0 06/05/2018 0820   BASOSABS 0.0 03/14/2014 0400    Lab Results  Component Value Date   HGBA1C 12.2 (A) 05/14/2019    Assessment & Plan:   1. Type 2 diabetes mellitus with hyperglycemia, with long-term current use of insulin (HCC) Uncontrolled with A1c of 12.2 and goal is less than 7.0 He has not been compliant with a diabetic diet We will increase Lantus from 20units to 30 units Counseled on Diabetic diet, my plate method, 921 minutes of moderate intensity exercise/week Blood sugar logs with fasting goals of 80-120 mg/dl, random of less than 180 and in the event of sugars less than 60 mg/dl or greater than 400 mg/dl encouraged to notify the clinic. Advised on the need for annual eye exams, annual foot exams, Pneumonia vaccine. - POCT glucose (manual entry) - POCT glycosylated hemoglobin (Hb A1C) - Insulin Glargine (LANTUS SOLOSTAR) 100 UNIT/ML Solostar Pen; Inject 30 Units into the skin daily.  Dispense: 30 mL; Refill: 3 - atorvastatin (LIPITOR) 80 MG tablet; Take 1 tablet (80 mg total) by mouth daily.  Dispense: 30 tablet; Refill: 3 - furosemide (LASIX) 40 MG tablet; Take 1 tablet (40 mg total) by mouth daily.  Dispense: 30 tablet; Refill: 3 -  metFORMIN (GLUCOPHAGE) 500 MG tablet; Take 2 tablets (1,000 mg total) by mouth 2 (two) times  daily with a meal.  Dispense: 120 tablet; Refill: 3  2. Essential hypertension Slightly elevated above goal No regimen change today but encouraged to comply with lifestyle modifications Counseled on blood pressure goal of less than 130/80, low-sodium, DASH diet, medication compliance, 150 minutes of moderate intensity exercise per week. Discussed medication compliance, adverse effects. - amLODipine (NORVASC) 10 MG tablet; Take 1 tablet (10 mg total) by mouth daily.  Dispense: 30 tablet; Refill: 3 - carvedilol (COREG) 6.25 MG tablet; Take 1 tablet (6.25 mg total) by mouth 2 (two) times daily with a meal.  Dispense: 60 tablet; Refill: 3 - lisinopril (ZESTRIL) 20 MG tablet; Take 1 tablet (20 mg total) by mouth daily.  Dispense: 30 tablet; Refill: 3  3. Chronic combined systolic and diastolic congestive heart failure (HCC) EF 60 to 65% in 12/2018 from care everywhere Euvolemic Continue ACE inhibitor, beta-blocker, Lasix  4. Lower urinary tract symptoms Commenced on Flomax - Ambulatory referral to Urology  5. Other male erectile dysfunction Tried Viagra previously which was ineffective Refer to urology as per request - Ambulatory referral to Urology   Health Care Maintenance: Tdap at next visit Meds ordered this encounter  Medications  . tamsulosin (FLOMAX) 0.4 MG CAPS capsule    Sig: Take 1 capsule (0.4 mg total) by mouth daily.    Dispense:  30 capsule    Refill:  3  . Insulin Glargine (LANTUS SOLOSTAR) 100 UNIT/ML Solostar Pen    Sig: Inject 30 Units into the skin daily.    Dispense:  30 mL    Refill:  3  . amLODipine (NORVASC) 10 MG tablet    Sig: Take 1 tablet (10 mg total) by mouth daily.    Dispense:  30 tablet    Refill:  3  . atorvastatin (LIPITOR) 80 MG tablet    Sig: Take 1 tablet (80 mg total) by mouth daily.    Dispense:  30 tablet    Refill:  3  . carvedilol (COREG) 6.25  MG tablet    Sig: Take 1 tablet (6.25 mg total) by mouth 2 (two) times daily with a meal.    Dispense:  60 tablet    Refill:  3  . gabapentin (NEURONTIN) 300 MG capsule    Sig: Take 1 capsule (300 mg total) by mouth 2 (two) times daily.    Dispense:  60 capsule    Refill:  3  . furosemide (LASIX) 40 MG tablet    Sig: Take 1 tablet (40 mg total) by mouth daily.    Dispense:  30 tablet    Refill:  3  . lisinopril (ZESTRIL) 20 MG tablet    Sig: Take 1 tablet (20 mg total) by mouth daily.    Dispense:  30 tablet    Refill:  3  . metFORMIN (GLUCOPHAGE) 500 MG tablet    Sig: Take 2 tablets (1,000 mg total) by mouth 2 (two) times daily with a meal.    Dispense:  120 tablet    Refill:  3    Follow-up: Return in about 3 months (around 08/14/2019) for Medical conditions-in person.       Charlott Rakes, MD, FAAFP. [facility] and [place], [place]   05/15/2019, 10:24 AM

## 2019-05-28 MED FILL — LANTUS SOLOSTAR 100 UNITS/M: 100 | 49 days supply | Qty: 15 | Fill #0

## 2019-05-28 MED FILL — metFORMIN HCL 500 MG TABS: 500 | 30 days supply | Qty: 120 | Fill #0

## 2019-05-30 ENCOUNTER — Telehealth: Payer: Medicaid Other | Admitting: Nurse Practitioner

## 2019-05-30 ENCOUNTER — Telehealth: Payer: Self-pay | Admitting: Family Medicine

## 2019-05-30 DIAGNOSIS — R05 Cough: Secondary | ICD-10-CM

## 2019-05-30 DIAGNOSIS — R059 Cough, unspecified: Secondary | ICD-10-CM

## 2019-05-30 MED ORDER — BENZONATATE 100 MG PO CAPS: 100.0000 mg | ORAL_CAPSULE | Freq: Three times a day (TID) | ORAL | 0 refills | Status: DC | PRN

## 2019-05-30 NOTE — Telephone Encounter (Signed)
Patient was called and a voicemail was left informing him to return phone call. 

## 2019-05-30 NOTE — Telephone Encounter (Signed)
Patient returned phone call and was given referral information to set up an appointment.

## 2019-05-30 NOTE — Telephone Encounter (Signed)
New Message   Pt calling states a referral was suppose to be sent out but they haven't heard anything and doesn't know where its suppose to be sent to. Please f/u

## 2019-05-30 NOTE — Progress Notes (Signed)

## 2019-05-31 NOTE — Telephone Encounter (Signed)
Pt called again with some more questions about this referral, please follow up when possible

## 2019-06-01 NOTE — Telephone Encounter (Signed)
Patient was informed that urology office will call him to set up an appointment.

## 2019-08-28 DIAGNOSIS — N5201 Erectile dysfunction due to arterial insufficiency: Secondary | ICD-10-CM | POA: Diagnosis not present

## 2019-09-03 ENCOUNTER — Other Ambulatory Visit: Payer: Self-pay | Admitting: Family Medicine

## 2019-09-03 DIAGNOSIS — R040 Epistaxis: Secondary | ICD-10-CM

## 2019-09-03 MED FILL — ATORVASTATIN 80 MG TABLET: 80 | 90 days supply | Qty: 90 | Fill #0

## 2019-09-03 MED FILL — TAMSULOSIN HCL 0.4 MG CAP: 0.4 | 30 days supply | Qty: 30 | Fill #2

## 2019-09-03 MED FILL — FUROSEMIDE 40 MG TAB: 40 | 30 days supply | Qty: 30 | Fill #1

## 2019-09-04 MED FILL — CETIRIZINE HCL 10 MG TABS: 10 | 30 days supply | Qty: 30 | Fill #0

## 2019-09-07 MED FILL — LANTUS SOLOSTAR 100 UNITS/M: 100 | 49 days supply | Qty: 15 | Fill #1

## 2019-10-04 ENCOUNTER — Other Ambulatory Visit: Payer: Self-pay | Admitting: Family Medicine

## 2019-10-04 DIAGNOSIS — I10 Essential (primary) hypertension: Secondary | ICD-10-CM

## 2019-10-04 MED FILL — metFORMIN HCL 500 MG TABS: 500 | 30 days supply | Qty: 120 | Fill #2

## 2019-10-04 MED FILL — FUROSEMIDE 40 MG TAB: 40 | 30 days supply | Qty: 30 | Fill #2

## 2019-10-05 MED FILL — LISINOPRIL 20 MG TABLET: 20 | 30 days supply | Qty: 30 | Fill #0

## 2019-10-17 ENCOUNTER — Ambulatory Visit: Payer: Medicaid Other | Admitting: Family Medicine

## 2019-10-23 ENCOUNTER — Ambulatory Visit: Payer: Medicaid Other | Admitting: Cardiology

## 2019-10-23 MED FILL — TAMSULOSIN HCL 0.4 MG CAP: 0.4 | 30 days supply | Qty: 30 | Fill #3

## 2019-10-23 MED FILL — CARVEDILOL 6.25 MG TABLET: 6.25 | 30 days supply | Qty: 60 | Fill #0

## 2019-10-29 DIAGNOSIS — N5201 Erectile dysfunction due to arterial insufficiency: Secondary | ICD-10-CM | POA: Diagnosis not present

## 2019-10-29 DIAGNOSIS — N486 Induration penis plastica: Secondary | ICD-10-CM | POA: Diagnosis not present

## 2019-11-13 ENCOUNTER — Ambulatory Visit: Payer: Medicaid Other | Admitting: Family Medicine

## 2019-11-15 ENCOUNTER — Ambulatory Visit: Payer: Medicaid Other | Admitting: Cardiology

## 2019-11-26 ENCOUNTER — Ambulatory Visit: Payer: Medicaid Other | Attending: Family Medicine | Admitting: Family Medicine

## 2019-11-26 ENCOUNTER — Other Ambulatory Visit: Payer: Self-pay | Admitting: Family Medicine

## 2019-11-26 ENCOUNTER — Encounter: Payer: Self-pay | Admitting: Family Medicine

## 2019-11-26 ENCOUNTER — Other Ambulatory Visit: Payer: Self-pay

## 2019-11-26 VITALS — BP 130/89 | HR 93 | Ht 69.0 in | Wt 193.0 lb

## 2019-11-26 DIAGNOSIS — I10 Essential (primary) hypertension: Secondary | ICD-10-CM

## 2019-11-26 DIAGNOSIS — E785 Hyperlipidemia, unspecified: Secondary | ICD-10-CM | POA: Insufficient documentation

## 2019-11-26 DIAGNOSIS — R399 Unspecified symptoms and signs involving the genitourinary system: Secondary | ICD-10-CM

## 2019-11-26 DIAGNOSIS — Z7982 Long term (current) use of aspirin: Secondary | ICD-10-CM | POA: Insufficient documentation

## 2019-11-26 DIAGNOSIS — Z79899 Other long term (current) drug therapy: Secondary | ICD-10-CM | POA: Diagnosis not present

## 2019-11-26 DIAGNOSIS — F32A Depression, unspecified: Secondary | ICD-10-CM

## 2019-11-26 DIAGNOSIS — I5042 Chronic combined systolic (congestive) and diastolic (congestive) heart failure: Secondary | ICD-10-CM | POA: Diagnosis not present

## 2019-11-26 DIAGNOSIS — F419 Anxiety disorder, unspecified: Secondary | ICD-10-CM

## 2019-11-26 DIAGNOSIS — I110 Hypertensive heart disease with heart failure: Secondary | ICD-10-CM

## 2019-11-26 DIAGNOSIS — E1165 Type 2 diabetes mellitus with hyperglycemia: Secondary | ICD-10-CM | POA: Diagnosis not present

## 2019-11-26 DIAGNOSIS — R42 Dizziness and giddiness: Secondary | ICD-10-CM | POA: Diagnosis not present

## 2019-11-26 DIAGNOSIS — F329 Major depressive disorder, single episode, unspecified: Secondary | ICD-10-CM | POA: Insufficient documentation

## 2019-11-26 DIAGNOSIS — R040 Epistaxis: Secondary | ICD-10-CM

## 2019-11-26 DIAGNOSIS — Z7901 Long term (current) use of anticoagulants: Secondary | ICD-10-CM | POA: Diagnosis not present

## 2019-11-26 DIAGNOSIS — Z794 Long term (current) use of insulin: Secondary | ICD-10-CM

## 2019-11-26 LAB — GLUCOSE, POCT (MANUAL RESULT ENTRY): POC Glucose: 558 mg/dl — AB (ref 70–99)

## 2019-11-26 MED ORDER — METFORMIN HCL 500 MG PO TABS: 1000.0000 mg | ORAL_TABLET | Freq: Two times a day (BID) | ORAL | 6 refills | Status: DC

## 2019-11-26 MED ORDER — CARVEDILOL 6.25 MG PO TABS: 6.2500 mg | ORAL_TABLET | Freq: Two times a day (BID) | ORAL | 6 refills | Status: DC

## 2019-11-26 MED ORDER — LANTUS SOLOSTAR 100 UNIT/ML ~~LOC~~ SOPN: 30.0000 [IU] | PEN_INJECTOR | Freq: Every day | SUBCUTANEOUS | 6 refills | Status: DC

## 2019-11-26 MED ORDER — FUROSEMIDE 40 MG PO TABS: 40.0000 mg | ORAL_TABLET | Freq: Every day | ORAL | 6 refills | Status: DC

## 2019-11-26 MED ORDER — GABAPENTIN 300 MG PO CAPS: 300.0000 mg | ORAL_CAPSULE | Freq: Two times a day (BID) | ORAL | 6 refills | Status: DC

## 2019-11-26 MED ORDER — TAMSULOSIN HCL 0.4 MG PO CAPS: 0.4000 mg | ORAL_CAPSULE | Freq: Every day | ORAL | 6 refills | Status: DC

## 2019-11-26 MED ORDER — ATORVASTATIN CALCIUM 80 MG PO TABS: 80.0000 mg | ORAL_TABLET | Freq: Every day | ORAL | 6 refills | Status: DC

## 2019-11-26 MED ORDER — LISINOPRIL 20 MG PO TABS: 20.0000 mg | ORAL_TABLET | Freq: Every day | ORAL | 6 refills | Status: DC

## 2019-11-26 MED ORDER — AMLODIPINE BESYLATE 10 MG PO TABS: 10.0000 mg | ORAL_TABLET | Freq: Every day | ORAL | 6 refills | Status: DC

## 2019-11-26 MED FILL — LISINOPRIL 20 MG TABLET: 20 | 30 days supply | Qty: 30 | Fill #0

## 2019-11-26 MED FILL — AMLODIPINE BESYLATE 10 MG T: 10 | 90 days supply | Qty: 90 | Fill #0

## 2019-11-26 MED FILL — TAMSULOSIN HCL 0.4 MG CAP: 0.4 | 90 days supply | Qty: 90 | Fill #0

## 2019-11-26 MED FILL — GABAPENTIN 300 MG CAPSULE: 300 | 30 days supply | Qty: 60 | Fill #0

## 2019-11-26 MED FILL — ATORVASTATIN 80 MG TABLET: 80 | 90 days supply | Qty: 90 | Fill #0

## 2019-11-26 MED FILL — CARVEDILOL 6.25 MG TABLET: 6.25 | 30 days supply | Qty: 60 | Fill #1

## 2019-11-26 MED FILL — FUROSEMIDE 40 MG TAB: 40 | 30 days supply | Qty: 30 | Fill #3

## 2019-11-26 MED FILL — METFORMIN HCL 500 MG TABS: 500 | 30 days supply | Qty: 120 | Fill #3

## 2019-11-26 NOTE — Patient Instructions (Signed)
Heart Failure Eating Plan Heart failure, also called congestive heart failure, occurs when your heart does not pump blood well enough to meet your body's needs for oxygen-rich blood. Heart failure is a long-term (chronic) condition. Living with heart failure can be challenging. However, following your health care provider's instructions about a healthy lifestyle and working with a diet and nutrition specialist (dietitian) to choose the right foods may help to improve your symptoms. What are tips for following this plan? Reading food labels  Check food labels for the amount of sodium per serving. Choose foods that have less than 140 mg (milligrams) of sodium in each serving.  Check food labels for the number of calories per serving. This is important if you need to limit your daily calorie intake to lose weight.  Check food labels for the serving size. If you eat more than one serving, you will be eating more sodium and calories than what is listed on the label.  Look for foods that are labeled as "sodium-free," "very low sodium," or "low sodium." ? Foods labeled as "reduced sodium" or "lightly salted" may still have more sodium than what is recommended for you. Cooking  Avoid adding salt when cooking. Ask your health care provider or dietitian before using salt substitutes.  Season food with salt-free seasonings, spices, or herbs. Check the label of seasoning mixes to make sure they do not contain salt.  Cook with heart-healthy oils, such as olive, canola, soybean, or sunflower oil.  Do not fry foods. Cook foods using low-fat methods, such as baking, boiling, grilling, and broiling.  Limit unhealthy fats when cooking by: ? Removing the skin from poultry, such as chicken. ? Removing all visible fats from meats. ? Skimming the fat off from stews, soups, and gravies before serving them. Meal planning   Limit your intake of: ? Processed, canned, or pre-packaged foods. ? Foods that are  high in trans fat, such as fried foods. ? Sweets, desserts, sugary drinks, and other foods with added sugar. ? Full-fat dairy products, such as whole milk.  Eat a balanced diet that includes: ? 4-5 servings of fruit each day and 4-5 servings of vegetables each day. At each meal, try to fill half of your plate with fruits and vegetables. ? Up to 6-8 servings of whole grains each day. ? Up to 2 servings of lean meat, poultry, or fish each day. One serving of meat is equal to 3 oz. This is about the same size as a deck of cards. ? 2 servings of low-fat dairy each day. ? Heart-healthy fats. Healthy fats called omega-3 fatty acids are found in foods such as flaxseed and cold-water fish like sardines, salmon, and mackerel.  Aim to eat 25-35 g (grams) of fiber a day. Foods that are high in fiber include apples, broccoli, carrots, beans, peas, and whole grains.  Do not add salt or condiments that contain salt (such as soy sauce) to foods before eating.  When eating at a restaurant, ask that your food be prepared with less salt or no salt, if possible.  Try to eat 2 or more vegetarian meals each week.  Eat more home-cooked food and eat less restaurant, buffet, and fast food. General information  Do not eat more than 2,300 mg of salt (sodium) a day. The amount of sodium that is recommended for you may be lower, depending on your condition.  Maintain a healthy body weight as directed. Ask your health care provider what a healthy weight is   for you. ? Check your weight every day. ? Work with your health care provider and dietitian to make a plan that is right for you to lose weight or maintain your current weight.  Limit how much fluid you drink. Ask your health care provider or dietitian how much fluid you can have each day.  Limit or avoid alcohol as told by your health care provider or dietitian. Recommended foods The items listed may not be a complete list. Talk with your dietitian about what  dietary choices are best for you. Fruits All fresh, frozen, and canned fruits. Dried fruits, such as raisins, prunes, and cranberries. Vegetables All fresh vegetables. Vegetables that are frozen without sauce or added salt. Low-sodium or sodium-free canned vegetables. Grains Bread with less than 80 mg of sodium per slice. Whole-wheat pasta, quinoa, and brown rice. Oats and oatmeal. Barley. Millet. Grits and cream of wheat. Whole-grain and whole-wheat cold cereal. Meats and other protein foods Lean cuts of meat. Skinless chicken and turkey. Fish with high omega-3 fatty acids, such as salmon, sardines, and other cold-water fishes. Eggs. Dried beans, peas, and edamame. Unsalted nuts and nut butters. Dairy Low-fat or nonfat (skim) milk and dried milk. Rice milk, soy milk, and almond milk. Low-fat or nonfat yogurt. Small amounts of reduced-sodium block cheese. Low-sodium cottage cheese. Fats and oils Olive, canola, soybean, flaxseed, or sunflower oil. Avocado. Sweets and desserts Apple sauce. Granola bars. Sugar-free pudding and gelatin. Frozen fruit bars. Seasoning and other foods Fresh and dried herbs. Lemon or lime juice. Vinegar. Low-sodium ketchup. Salt-free marinades, salad dressings, sauces, and seasonings. The items listed above may not be a complete list of foods and beverages you can eat. Contact a dietitian for more information. Foods to avoid The items listed may not be a complete list. Talk with your dietitian about what dietary choices are best for you. Fruits Fruits that are dried with sodium-containing preservatives. Vegetables Canned vegetables. Frozen vegetables with sauce or seasonings. Creamed vegetables. French fries. Onion rings. Pickled vegetables and sauerkraut. Grains Bread with more than 80 mg of sodium per slice. Hot or cold cereal with more than 140 mg sodium per serving. Salted pretzels and crackers. Pre-packaged breadcrumbs. Bagels, croissants, and biscuits. Meats  and other protein foods Ribs and chicken wings. Bacon, ham, pepperoni, bologna, salami, and packaged luncheon meats. Hot dogs, bratwurst, and sausage. Canned meat. Smoked meat and fish. Salted nuts and seeds. Dairy Whole milk, half-and-half, and cream. Buttermilk. Processed cheese, cheese spreads, and cheese curds. Regular cottage cheese. Feta cheese. Shredded cheese. String cheese. Fats and oils Butter, lard, shortening, ghee, and bacon fat. Canned and packaged gravies. Seasoning and other foods Onion salt, garlic salt, table salt, and sea salt. Marinades. Regular salad dressings. Relishes, pickles, and olives. Meat flavorings and tenderizers, and bouillon cubes. Horseradish, ketchup, and mustard. Worcestershire sauce. Teriyaki sauce, soy sauce (including reduced sodium). Hot sauce and Tabasco sauce. Steak sauce, fish sauce, oyster sauce, and cocktail sauce. Taco seasonings. Barbecue sauce. Tartar sauce. The items listed above may not be a complete list of foods and beverages you should avoid. Contact a dietitian for more information. Summary  A heart failure eating plan includes changes that limit your intake of sodium and unhealthy fat, and it may help you lose weight or maintain a healthy weight. Your health care provider may also recommend limiting how much fluid you drink.  Most people with heart failure should eat no more than 2,300 mg of salt (sodium) a day. The amount of sodium   that is recommended for you may be lower, depending on your condition.  Contact your health care provider or dietitian before making any major changes to your diet. This information is not intended to replace advice given to you by your health care provider. Make sure you discuss any questions you have with your health care provider. Document Revised: 09/21/2018 Document Reviewed: 12/10/2016 Elsevier Patient Education  2020 Elsevier Inc.  

## 2019-11-26 NOTE — Progress Notes (Signed)
Light headed, dizzy, SOB.

## 2019-11-26 NOTE — Progress Notes (Signed)
Subjective:  Patient ID: Kenneth Rios, male    DOB: Oct 02, 1965  Age: 54 y.o. 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  States he is under a lot of stress as his current Landlord will be selling his apartment and very soon he will be without a place of residence.  He declined speaking with the social worker or commencement of medication for depression even though he endorses being depressed; denies suicidal ideations or intent. He seems to be in an angry mood and asks me "when will you be done?" Dyspneic all the time; endorses compliance with his medication.  Sees Cardiology in [facility] next month.  With regards to his diabetes mellitus he endorses compliance with Lantus but does not check his blood sugars.  He has no complaints of hypoglycemia and no neuropathy.  Past Medical History:  Diagnosis Date  . (HFpEF) heart failure with preserved ejection fraction ([place]) 03/30/2018  . Accelerated hypertension 06/05/2018  . Acute diverticulitis 08/29/2017  . Acute viral bronchitis 08/24/2017  . Angina pectoris ([place]) 03/29/2018  . Anxiety   . Anxiety and depression 02/06/2018  . Asthma with status asthmaticus   . Chest tightness 03/29/2018  . CHF (congestive heart failure) ([place])    "fluttering"  . Depression   . Diabetes mellitus without complication ([place])   . Diverticulitis   . Diverticulitis large intestine 05/31/2018  . GERD (gastroesophageal reflux disease)   . Hyperlipidemia   . Hypertension   . Hypertensive urgency 08/24/2017  . Microcytic  anemia 12/19/2018  . Palpitations 03/29/2018  . Peritonitis ([place]) 09/02/2017  . Pneumonia 12/16/2018  . Polysubstance abuse ([place]) 12/19/2018  . Prolonged Q-T interval on ECG 02/06/2018  . S/P colostomy ([place]) 09/16/2017  . Unstable angina ([place]) 03/29/2018    Past Surgical History:  Procedure Laterality Date  . CARDIAC CATHETERIZATION  03/30/2018  . COLON RESECTION N/A 09/05/2017   Procedure: HARTMAN'S COLECTOMY AND COLOSTOMY;  Surgeon: Kieth Brightly [name], MD;  Location: [place];  Service: General;  Laterality: N/A;  . COLON SURGERY    . COLOSTOMY TAKEDOWN N/A 05/31/2018   Procedure: LAPAROSCOPIC COLOSTOMY REVERSAL COLORECTAL ANASTOMOSIS ERAS PATHWAY;  Surgeon: [name], [name], MD;  Location: [place];  Service: General;  Laterality: N/A;  . INTRAVASCULAR PRESSURE WIRE/FFR STUDY N/A 03/30/2018   Procedure: INTRAVASCULAR PRESSURE WIRE/FFR STUDY;  Surgeon: Nelva Bush, MD;  Location: [place] [facility];  Service: Cardiovascular;  Laterality: N/A;  . LEFT HEART CATH AND CORONARY ANGIOGRAPHY N/A 03/30/2018   Procedure: LEFT HEART CATH AND CORONARY ANGIOGRAPHY;  Surgeon: Nelva Bush, MD;  Location: [place] [facility];  Service: Cardiovascular;  Laterality: N/A;    Family History  Problem Relation Age of Onset  . Heart disease Mother   . Heart disease Sister   . Diabetes Maternal Aunt   . Heart disease Maternal Aunt   . Diabetes Maternal Uncle   . Sudden Cardiac Death Neg Hx     No Known Allergies  Outpatient Medications Prior to Visit  Medication Sig Dispense Refill  . amLODipine (NORVASC) 10 MG tablet Take 1  tablet (10 mg total) by mouth daily. 30 tablet 3  . aspirin EC 81 MG tablet Take 81 mg by mouth daily.    Marland Kitchen atorvastatin (LIPITOR) 80 MG tablet Take 1 tablet (80 mg total) by mouth daily. 30 tablet 3  . blood glucose meter kit and supplies Dispense based on patient and insurance preference. Use up to four times daily as directed. (FOR ICD-10 E10.9, E11.9). 1 each 0  . Blood  Glucose Monitoring Suppl (ACCU-CHEK AVIVA) device Use as instructed 3 times daily. 1 each 0  . carvedilol (COREG) 6.25 MG tablet Take 1 tablet (6.25 mg total) by mouth 2 (two) times daily with a meal. 60 tablet 3  . cetirizine (ZYRTEC) 10 MG tablet TAKE 1 TABLET (10 MG TOTAL) BY MOUTH DAILY. 30 tablet 0  . fluticasone (FLONASE) 50 MCG/ACT nasal spray Place 2 sprays into both nostrils daily. 16 g 1  . furosemide (LASIX) 40 MG tablet Take 1 tablet (40 mg total) by mouth daily. 30 tablet 3  . gabapentin (NEURONTIN) 300 MG capsule Take 1 capsule (300 mg total) by mouth 2 (two) times daily. 60 capsule 3  . glucose blood (ACCU-CHEK AVIVA) test strip Use 3 times daily 100 each 12  . Insulin Glargine (LANTUS SOLOSTAR) 100 UNIT/ML Solostar Pen Inject 30 Units into the skin daily. 30 mL 3  . Insulin Pen Needle 31G X 5 MM MISC Inject insulin via insulin pen once daily 90 each 3  . Lancets (ACCU-CHEK SOFT TOUCH) lancets Use as instructed 3 times daily 100 each 12  . lisinopril (ZESTRIL) 20 MG tablet TAKE 1 TABLET (20 MG TOTAL) BY MOUTH DAILY. 30 tablet 0  . metFORMIN (GLUCOPHAGE) 500 MG tablet Take 2 tablets (1,000 mg total) by mouth 2 (two) times daily with a meal. 120 tablet 3  . sildenafil (VIAGRA) 50 MG tablet Take 1 tablet (50 mg total) by mouth daily as needed for erectile dysfunction. At least 24 hrs between doses.  Don't take with nitroglycerin 10 tablet 0  . tamsulosin (FLOMAX) 0.4 MG CAPS capsule Take 1 capsule (0.4 mg total) by mouth daily. 30 capsule 3  . benzonatate (TESSALON PERLES) 100 MG capsule Take 1 capsule (100 mg total) by mouth 3 (three) times daily as needed. 20 capsule 0  . diphenhydrAMINE (BENADRYL) 2 % cream Apply topically 3 (three) times daily as needed for itching. (Patient not taking: Reported on 05/14/2019) 30 g 0  . mupirocin ointment (BACTROBAN) 2 % Apply 1 application topically 2 (two) times daily. (Patient not taking: Reported on 05/14/2019) 30 g 0  . oxyCODONE (OXY  IR/ROXICODONE) 5 MG immediate release tablet Take 1 tablet (5 mg total) by mouth every 6 (six) hours as needed for breakthrough pain. (Patient not taking: Reported on 10/05/2018) 28 tablet 0   No facility-administered medications prior to visit.     ROS Review of Systems  Constitutional: Negative for activity change and appetite change.  HENT: Negative for sinus pressure and sore throat.   Eyes: Negative for visual disturbance.  Respiratory: Negative for cough, chest tightness and shortness of breath.   Cardiovascular: Negative for chest pain and leg swelling.  Gastrointestinal: Negative for abdominal distention, abdominal pain, constipation and diarrhea.  Endocrine: Negative.   Genitourinary: Negative for dysuria.  Musculoskeletal: Negative for joint swelling and myalgias.  Skin: Negative for rash.  Allergic/Immunologic: Negative.   Neurological: Negative for weakness, light-headedness and numbness.  Psychiatric/Behavioral: Positive for dysphoric mood. Negative for suicidal ideas.    Objective:  BP 130/89   Pulse 93   Ht '5\' 9"'$  (1.753 m)   Wt 193 lb (87.5 kg)   SpO2 98%   BMI 28.50 kg/m   BP/Weight 11/26/2019 05/14/2019 11/02/7122  Systolic BP 580 998 338  Diastolic BP 89 93 78  Wt. (Lbs) 193 205 225  BMI 28.5 30.27 33.23      Physical Exam Constitutional:      Appearance: He is well-developed.  Neck:     Vascular: No JVD.  Cardiovascular:     Rate and Rhythm: Normal rate.     Heart sounds: Normal heart sounds. No murmur.  Pulmonary:     Effort: Pulmonary effort is normal.     Breath sounds: Normal breath sounds. No wheezing or rales.  Chest:     Chest wall: No tenderness.  Abdominal:     General: Bowel sounds are normal. There is no distension.     Palpations: Abdomen is soft. There is no mass.     Tenderness: There is no abdominal tenderness.     Comments: Scar from previous colostomy reversal  Musculoskeletal:        General: Normal range of motion.      Right lower leg: No edema.     Left lower leg: No edema.  Neurological:     Mental Status: He is alert and oriented to person, place, and time.  Psychiatric:        Mood and Affect: Affect is angry.     CMP Latest Ref Rng & Units 01/18/2019 06/20/2018 06/05/2018  Glucose 65 - 99 mg/dL 103(H) 135(H) 190(H)  BUN 6 - 24 mg/dL '15 12 6  '$ Creatinine 0.76 - 1.27 mg/dL 1.23 0.99 0.81  Sodium 134 - 144 mmol/L 138 142 137  Potassium 3.5 - 5.2 mmol/L 4.0 4.6 3.3(L)  Chloride 96 - 106 mmol/L 102 102 105  CO2 20 - 29 mmol/L '21 23 26  '$ Calcium 8.7 - 10.2 mg/dL 8.8 9.6 8.8(L)  Total Protein 6.0 - 8.5 g/dL 6.8 - -  Total Bilirubin 0.0 - 1.2 mg/dL 0.4 - -  Alkaline Phos 39 - 117 IU/L 97 - -  AST 0 - 40 IU/L 16 - -  ALT 0 - 44 IU/L 30 - -    Lipid Panel     Component Value Date/Time   CHOL 155 01/18/2019 0957   TRIG 75 01/18/2019 0957   HDL 48 01/18/2019 0957   CHOLHDL 3.2 01/18/2019 0957   CHOLHDL 6.1 03/31/2018 0227   VLDL UNABLE TO CALCULATE IF TRIGLYCERIDE OVER 400 mg/dL 03/31/2018 0227   LDLCALC 92 01/18/2019 0957    CBC    Component Value Date/Time   WBC 5.8 06/05/2018 0820   RBC 3.35 (L) 06/05/2018 0820   HGB 9.2 (L) 06/05/2018 0820   HGB 14.9 03/29/2018 1120   HCT 27.2 (L) 06/05/2018 0820   HCT 42.2 03/29/2018 1120   PLT 331 06/05/2018 0820   PLT 300 03/29/2018 1120   MCV 81.2 06/05/2018 0820   MCV 84 03/29/2018 1120   MCV 82 03/14/2014 0400   MCH 27.5 06/05/2018 0820   MCHC 33.8 06/05/2018 0820   RDW 12.5 06/05/2018 0820   RDW 13.7 03/29/2018 1120   RDW 13.5 03/14/2014 0400   LYMPHSABS 2.4 06/05/2018 0820   LYMPHSABS 0.9 (L) 03/14/2014 0400   MONOABS 0.4 06/05/2018 0820   MONOABS 0.1 (L) 03/14/2014 0400   EOSABS 0.4 06/05/2018 0820   EOSABS 0.0 03/14/2014 0400   BASOSABS 0.0 06/05/2018 0820  BASOSABS 0.0 03/14/2014 0400    Lab Results  Component Value Date   HGBA1C 12.2 (A) 05/14/2019    Assessment & Plan:  1. Type 2 diabetes mellitus with hyperglycemia,  with long-term current use of insulin (HCC) Uncontrolled with A1c of 12.2 Regimen accordingly Counseled on Diabetic diet, my plate method, 202 minutes of moderate intensity exercise/week Blood sugar logs with fasting goals of 80-120 mg/dl, random of less than 180 and in the event of sugars less than 60 mg/dl or greater than 400 mg/dl encouraged to notify the clinic. Advised on the need for annual eye exams, annual foot exams, Pneumonia vaccine. - POCT glucose (manual entry) - Hemoglobin A1c - atorvastatin (LIPITOR) 80 MG tablet; Take 1 tablet (80 mg total) by mouth daily.  Dispense: 30 tablet; Refill: 6 - furosemide (LASIX) 40 MG tablet; Take 1 tablet (40 mg total) by mouth daily.  Dispense: 30 tablet; Refill: 6 - insulin glargine (LANTUS SOLOSTAR) 100 UNIT/ML Solostar Pen; Inject 30 Units into the skin daily.  Dispense: 30 mL; Refill: 6 - metFORMIN (GLUCOPHAGE) 500 MG tablet; Take 2 tablets (1,000 mg total) by mouth 2 (two) times daily with a meal.  Dispense: 120 tablet; Refill: 6  2. Essential hypertension Controlled Counseled on blood pressure goal of less than 130/80, low-sodium, DASH diet, medication compliance, 150 minutes of moderate intensity exercise per week. Discussed medication compliance, adverse effects. - Basic Metabolic Panel - amLODipine (NORVASC) 10 MG tablet; Take 1 tablet (10 mg total) by mouth daily.  Dispense: 30 tablet; Refill: 6 - carvedilol (COREG) 6.25 MG tablet; Take 1 tablet (6.25 mg total) by mouth 2 (two) times daily with a meal.  Dispense: 60 tablet; Refill: 6 - lisinopril (ZESTRIL) 20 MG tablet; Take 1 tablet (20 mg total) by mouth daily.  Dispense: 30 tablet; Refill: 6  3. Anxiety and depression Uncontrolled Worsened by underlying social determinants of health coupled with underlying chronic medical condition Offered to have the LCSW meet with him however he declines He is refusing initiation of medication or referral for therapy  4. Hypertensive heart  disease with chronic combined systolic and diastolic congestive heart failure (HCC) EF of 60 to 65% from echo 12/2018 from [facility] He does have chronic dyspnea some of this may be related to inactivity Euvolemic Continue with Lasix, beta-blocker, ACE inhibitor Follow-up with cardiology  5. Lower urinary tract symptoms Stable - tamsulosin (FLOMAX) 0.4 MG CAPS capsule; Take 1 capsule (0.4 mg total) by mouth daily.  Dispense: 30 capsule; Refill: 6    Return in about 3 months (around 02/25/2020) for chronic disease management.   Charlott Rakes, MD, FAAFP. [facility], [place]   11/26/2019, 2:43 PM

## 2019-11-27 LAB — BASIC METABOLIC PANEL
BUN/Creatinine Ratio: 14 (ref 9–20)
BUN: 18 mg/dL (ref 6–24)
CO2: 22 mmol/L (ref 20–29)
Calcium: 10.5 mg/dL — ABNORMAL HIGH (ref 8.7–10.2)
Chloride: 92 mmol/L — ABNORMAL LOW (ref 96–106)
Creatinine, Ser: 1.3 mg/dL — ABNORMAL HIGH (ref 0.76–1.27)
GFR calc Af Amer: 72 mL/min/{1.73_m2} (ref >59)
GFR calc non Af Amer: 62 mL/min/{1.73_m2} (ref >59)
Glucose: 568 mg/dL (ref 65–99)
Potassium: 4.9 mmol/L (ref 3.5–5.2)
Sodium: 134 mmol/L (ref 134–144)

## 2019-11-27 LAB — HEMOGLOBIN A1C
Est. average glucose Bld gHb Est-mCnc: 306 mg/dL
Hgb A1c MFr Bld: 12.3 % — ABNORMAL HIGH (ref 4.8–5.6)

## 2019-11-27 MED ORDER — LANTUS SOLOSTAR 100 UNIT/ML ~~LOC~~ SOPN: 40.0000 [IU] | PEN_INJECTOR | Freq: Every day | SUBCUTANEOUS | 6 refills | Status: DC

## 2019-11-27 MED FILL — CETIRIZINE HCL 10 MG TABS: 10 | 90 days supply | Qty: 90 | Fill #0

## 2019-11-27 MED FILL — LANTUS SOLOSTAR 100 UNITS/M: 100 | 37 days supply | Qty: 15 | Fill #0

## 2019-11-29 ENCOUNTER — Telehealth: Payer: Self-pay

## 2019-11-29 NOTE — Telephone Encounter (Signed)
-----   Message from Hoy Register, MD sent at 11/27/2019  1:41 PM EDT ----- Please inform him A1c is severely elevated at 12.3 unchanged from previous labs.  Glucose is severely elevated in the 500s.  I have increased his Lantus to 40 units at bedtime and compliance with medication and a diabetic diet is imperative.

## 2019-11-29 NOTE — Telephone Encounter (Signed)
Patient was called at both number on file a voicemail was left informing patient to return phone call.

## 2019-12-01 ENCOUNTER — Encounter: Payer: Self-pay | Admitting: Cardiology

## 2019-12-03 ENCOUNTER — Telehealth: Payer: Self-pay

## 2019-12-03 NOTE — Telephone Encounter (Signed)
Patient was called and a voicemail was left informing patient to return phone call for lab results.  Letter has been mailed out.

## 2019-12-11 ENCOUNTER — Ambulatory Visit: Payer: Medicaid Other | Admitting: Cardiology

## 2019-12-21 ENCOUNTER — Other Ambulatory Visit: Payer: Self-pay | Admitting: Pharmacist

## 2019-12-21 MED ORDER — INSULIN PEN NEEDLE 31G X 5 MM MISC: 6 refills | Status: DC

## 2020-01-08 ENCOUNTER — Other Ambulatory Visit: Payer: Self-pay

## 2020-01-09 ENCOUNTER — Ambulatory Visit (INDEPENDENT_AMBULATORY_CARE_PROVIDER_SITE_OTHER): Payer: Medicaid Other | Admitting: Cardiology

## 2020-01-09 ENCOUNTER — Encounter: Payer: Self-pay | Admitting: Cardiology

## 2020-01-09 ENCOUNTER — Other Ambulatory Visit: Payer: Self-pay

## 2020-01-09 VITALS — BP 118/90 | HR 92 | Ht 69.0 in | Wt 197.0 lb

## 2020-01-09 DIAGNOSIS — E782 Mixed hyperlipidemia: Secondary | ICD-10-CM | POA: Insufficient documentation

## 2020-01-09 DIAGNOSIS — I10 Essential (primary) hypertension: Secondary | ICD-10-CM | POA: Diagnosis not present

## 2020-01-09 DIAGNOSIS — E088 Diabetes mellitus due to underlying condition with unspecified complications: Secondary | ICD-10-CM

## 2020-01-09 HISTORY — DX: Diabetes mellitus due to underlying condition with unspecified complications: E08.8

## 2020-01-09 HISTORY — DX: Mixed hyperlipidemia: E78.2

## 2020-01-09 NOTE — Patient Instructions (Signed)

## 2020-01-09 NOTE — Progress Notes (Signed)
Cardiology Office Note:    Date:  01/09/2020   ID:  Kenneth Rios, DOB 1965/12/18, MRN [id number]  PCP:  Charlott Rakes, MD  Cardiologist:  Jenean Lindau, MD   Referring MD: Charlott Rakes, MD    ASSESSMENT:    1. Essential hypertension   2. Mixed dyslipidemia   3. Diabetes mellitus due to underlying condition with unspecified complications ([place])    PLAN:    In order of problems listed above:  1. Coronary artery disease: Secondary prevention stressed with the patient.  Importance of compliance with diet medication stressed and he vocalized understanding. 2. Essential hypertension: Blood pressure is stable.  Diet was emphasized. 3. Mixed dyslipidemia: He mentions to me that his blood work is followed by his primary care physician and he will go fasting next time for blood work and send me a copy of lipids 4. Diabetes mellitus: I emphasized importance of compliance with diet medications and medical advice otherwise he is at high risk for coronary and cardiovascular vascular events as such.  He understands 5. Patient will be seen in follow-up appointment in 6 months or earlier if the patient has any concerns    Medication Adjustments/Labs and Tests Ordered: Current medicines are reviewed at length with the patient today.  Concerns regarding medicines are outlined above.  Orders Placed This Encounter  Procedures  . EKG 12-Lead   No orders of the defined types were placed in this encounter.    Chief Complaint  Patient presents with  . Follow-up     History of Present Illness:    Kenneth Rios is a 54 y.o. male.  Patient has past medical history of coronary artery disease, essential hypertension dyslipidemia and diabetes mellitus.  I am not sure about his compliance with medications as his sugars have been in greater than 500 at times and I have seen multiple such readings.  He denies any chest pain orthopnea or PND.  He leads a fairly sedentary lifestyle.  At the  time of my evaluation, the patient is alert awake oriented and in no distress.  Past Medical History:  Diagnosis Date  . (HFpEF) heart failure with preserved ejection fraction ([place]) 03/30/2018  . Accelerated hypertension 06/05/2018  . Acute diverticulitis 08/29/2017  . Acute viral bronchitis 08/24/2017  . Angina pectoris ([place]) 03/29/2018  . Anxiety   . Anxiety and depression 02/06/2018  . Asthma with status asthmaticus   . Chest tightness 03/29/2018  . CHF (congestive heart failure) ([place])    "fluttering"  . Depression   . Diabetes mellitus without complication ([place])   . Diverticulitis   . Diverticulitis large intestine 05/31/2018  . GERD (gastroesophageal reflux disease)   . Hyperlipidemia   . Hypertension   . Hypertensive urgency 08/24/2017  . Microcytic anemia 12/19/2018  . Palpitations 03/29/2018  . Peritonitis ([place]) 09/02/2017  . Pneumonia 12/16/2018  . Polysubstance abuse ([place]) 12/19/2018  . Prolonged Q-T interval on ECG 02/06/2018  . S/P colostomy ([place]) 09/16/2017  . Unstable angina ([place]) 03/29/2018    Past Surgical History:  Procedure Laterality Date  . CARDIAC CATHETERIZATION  03/30/2018  . COLON RESECTION N/A 09/05/2017   Procedure: HARTMAN'S COLECTOMY AND COLOSTOMY;  Surgeon: Kieth Brightly [name], MD;  Location: [place];  Service: General;  Laterality: N/A;  . COLON SURGERY    . COLOSTOMY TAKEDOWN N/A 05/31/2018   Procedure: LAPAROSCOPIC COLOSTOMY REVERSAL COLORECTAL ANASTOMOSIS ERAS PATHWAY;  Surgeon: [name], [name], MD;  Location: [place];  Service: General;  Laterality: N/A;  .  INTRAVASCULAR PRESSURE WIRE/FFR STUDY N/A 03/30/2018   Procedure: INTRAVASCULAR PRESSURE WIRE/FFR STUDY;  Surgeon: Nelva Bush, MD;  Location: [facility];  Service: Cardiovascular;  Laterality: N/A;  . LEFT HEART CATH AND CORONARY ANGIOGRAPHY N/A 03/30/2018   Procedure: LEFT HEART CATH AND CORONARY ANGIOGRAPHY;  Surgeon: Nelva Bush, MD;  Location: [facility];  Service:  Cardiovascular;  Laterality: N/A;    Current Medications: Current Meds  Medication Sig  . amLODipine (NORVASC) 10 MG tablet Take 1 tablet (10 mg total) by mouth daily.  Marland Kitchen atorvastatin (LIPITOR) 80 MG tablet Take 1 tablet (80 mg total) by mouth daily.  . blood glucose meter kit and supplies Dispense based on patient and insurance preference. Use up to four times daily as directed. (FOR ICD-10 E10.9, E11.9).  Marland Kitchen Blood Glucose Monitoring Suppl (ACCU-CHEK AVIVA) device Use as instructed 3 times daily.  . carvedilol (COREG) 6.25 MG tablet Take 1 tablet (6.25 mg total) by mouth 2 (two) times daily with a meal.  . cetirizine (ZYRTEC) 10 MG tablet TAKE 1 TABLET (10 MG TOTAL) BY MOUTH DAILY.  . fluticasone (FLONASE) 50 MCG/ACT nasal spray Place 2 sprays into both nostrils daily.  . furosemide (LASIX) 40 MG tablet Take 1 tablet (40 mg total) by mouth daily.  Marland Kitchen gabapentin (NEURONTIN) 300 MG capsule Take 1 capsule (300 mg total) by mouth 2 (two) times daily.  Marland Kitchen glucose blood (ACCU-CHEK AVIVA) test strip Use 3 times daily  . insulin glargine (LANTUS SOLOSTAR) 100 UNIT/ML Solostar Pen Inject 40 Units into the skin daily.  . Insulin Pen Needle 31G X 5 MM MISC Inject insulin via insulin pen once daily  . Lancets (ACCU-CHEK SOFT TOUCH) lancets Use as instructed 3 times daily  . lisinopril (ZESTRIL) 20 MG tablet Take 1 tablet (20 mg total) by mouth daily.  . metFORMIN (GLUCOPHAGE) 500 MG tablet Take 2 tablets (1,000 mg total) by mouth 2 (two) times daily with a meal.  . sildenafil (VIAGRA) 50 MG tablet Take 1 tablet (50 mg total) by mouth daily as needed for erectile dysfunction. At least 24 hrs between doses.  Don't take with nitroglycerin  . tamsulosin (FLOMAX) 0.4 MG CAPS capsule Take 1 capsule (0.4 mg total) by mouth daily.     Allergies:   Patient has no known allergies.   Social History   Socioeconomic History  . Marital status: Single    Spouse name: Not on file  . Number of children: 3  . Years  of education: Not on file  . Highest education level: Not on file  Occupational History  . Occupation: Disabled  Tobacco Use  . Smoking status: Former Research scientist (life sciences)  . Smokeless tobacco: Never Used  Substance and Sexual Activity  . Alcohol use: Yes    Alcohol/week: 6.0 standard drinks    Types: 6 Cans of beer per week    Comment: on the weekends  . Drug use: No  . Sexual activity: Yes    Partners: Female  Other Topics Concern  . Not on file  Social History Narrative  . Not on file   Social Determinants of Health   Financial Resource Strain:   . Difficulty of Paying Living Expenses:   Food Insecurity:   . Worried About Charity fundraiser in the Last Year:   . Arboriculturist in the Last Year:   Transportation Needs:   . Film/video editor (Medical):   Marland Kitchen Lack of Transportation (Non-Medical):   Physical Activity:   .  Days of Exercise per Week:   . Minutes of Exercise per Session:   Stress:   . Feeling of Stress :   Social Connections:   . Frequency of Communication with Friends and Family:   . Frequency of Social Gatherings with Friends and Family:   . Attends Religious Services:   . Active Member of Clubs or Organizations:   . Attends Archivist Meetings:   Marland Kitchen Marital Status:      Family History: The patient's family history includes Diabetes in his maternal aunt and maternal uncle; Heart disease in his maternal aunt, mother, and sister. There is no history of Sudden Cardiac Death.  ROS:   Please see the history of present illness.    All other systems reviewed and are negative.  EKGs/Labs/Other Studies Reviewed:    The following studies were reviewed today: End, Harrell Gave, MD (Primary)    Procedures  INTRAVASCULAR PRESSURE WIRE/FFR STUDY  LEFT HEART CATH AND CORONARY ANGIOGRAPHY  Conclusion  Conclusions: 1. 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  Otherwise, mild to moderate 3-vessel CAD. FFR of LAD and LCx are not hemodynamically significant  by FFR. 2. Probably normal LVEF, though opacification of the apical portion of the left ventricle was suboptimal with hand and power injections. 3. Normal LVEDP.  Recommendations: 1. Medical therapy; will add long-acting nitrate. 2. Escalate statin therapy and check lipid panel with morning labs. 3. Due to shortness of breath and late timing of case, will keep overnight for extended recovery. Likely discharge home in AM. 4. Recommend indefinite single antiplatelet therapy with aspirin 81 mg daily due to moderate coronary artery disease.  Nelva Bush, MD [place] Pager: [phone]      Recent Labs: 01/18/2019: ALT 30 11/26/2019: BUN 18; Creatinine, Ser 1.30; Potassium 4.9; Sodium 134  Recent Lipid Panel    Component Value Date/Time   CHOL 155 01/18/2019 0957   TRIG 75 01/18/2019 0957   HDL 48 01/18/2019 0957   CHOLHDL 3.2 01/18/2019 0957   CHOLHDL 6.1 03/31/2018 0227   VLDL UNABLE TO CALCULATE IF TRIGLYCERIDE OVER 400 mg/dL 03/31/2018 0227   LDLCALC 92 01/18/2019 0957    Physical Exam:    VS:  BP 118/90   Pulse 92   Ht '5\' 9"'$  (1.753 m)   Wt 197 lb (89.4 kg)   SpO2 99%   BMI 29.09 kg/m     Wt Readings from Last 3 Encounters:  01/09/20 197 lb (89.4 kg)  11/26/19 193 lb (87.5 kg)  05/14/19 205 lb (93 kg)     GEN: Patient is in no acute distress HEENT: Normal NECK: No JVD; No carotid bruits LYMPHATICS: No lymphadenopathy CARDIAC: Hear sounds regular, 2/6 systolic murmur at the apex. RESPIRATORY:  Clear to auscultation without rales, wheezing or rhonchi  ABDOMEN: Soft, non-tender, non-distended MUSCULOSKELETAL:  No edema; No deformity  SKIN: Warm and dry NEUROLOGIC:  Alert and oriented x 3 PSYCHIATRIC:  Normal affect   Signed, Jenean Lindau, MD  01/09/2020 3:53 PM    [facility]

## 2020-01-21 MED FILL — METFORMIN HCL 500 MG TABS: 500 | 30 days supply | Qty: 120 | Fill #0

## 2020-01-21 MED FILL — CARVEDILOL 6.25 MG TABLET: 6.25 | 30 days supply | Qty: 60 | Fill #2

## 2020-01-21 MED FILL — LISINOPRIL 20 MG TABLET: 20 | 30 days supply | Qty: 30 | Fill #1

## 2020-01-21 MED FILL — ATORVASTATIN 80 MG TABLET: 80 | 30 days supply | Qty: 30 | Fill #1

## 2020-01-21 MED FILL — FUROSEMIDE 40 MG TAB: 40 | 30 days supply | Qty: 30 | Fill #0

## 2020-01-21 MED FILL — AMLODIPINE BESYLATE 10 MG T: 10 | 90 days supply | Qty: 90 | Fill #0

## 2020-01-28 MED FILL — LANTUS SOLOSTAR 100 UNITS/M: 100 | 49 days supply | Qty: 15 | Fill #2

## 2020-02-07 MED FILL — LISINOPRIL 20 MG TABLET: 20 | 30 days supply | Qty: 30 | Fill #1

## 2020-02-07 MED FILL — ATORVASTATIN 80 MG TABLET: 80 | 30 days supply | Qty: 30 | Fill #1

## 2020-02-07 MED FILL — CARVEDILOL 6.25 MG TABLET: 6.25 | 30 days supply | Qty: 60 | Fill #2

## 2020-03-03 MED FILL — LISINOPRIL 20 MG TABLET: 20 | 30 days supply | Qty: 30 | Fill #1

## 2020-03-03 MED FILL — CARVEDILOL 6.25 MG TABLET: 6.25 | 30 days supply | Qty: 60 | Fill #2

## 2020-03-03 MED FILL — ATORVASTATIN 80 MG TABLET: 80 | 30 days supply | Qty: 30 | Fill #1

## 2020-03-12 MED FILL — METFORMIN HCL 500 MG TABS: 500 | 30 days supply | Qty: 120 | Fill #1

## 2020-03-17 MED FILL — FUROSEMIDE 40 MG TAB: 40 | 30 days supply | Qty: 30 | Fill #1

## 2020-03-24 ENCOUNTER — Ambulatory Visit: Payer: Medicaid Other | Admitting: Family Medicine

## 2020-05-05 MED FILL — METFORMIN HCL 500 MG TABS: 500 | 30 days supply | Qty: 120 | Fill #1

## 2020-05-05 MED FILL — LISINOPRIL 20 MG TABLET: 20 | 30 days supply | Qty: 30 | Fill #2

## 2020-05-05 MED FILL — FUROSEMIDE 40 MG TAB: 40 | 30 days supply | Qty: 30 | Fill #1

## 2020-05-05 MED FILL — AMLODIPINE BESYLATE 10 MG T: 10 | 30 days supply | Qty: 30 | Fill #1

## 2020-05-05 MED FILL — TAMSULOSIN HCL 0.4 MG CAP: 0.4 | 30 days supply | Qty: 30 | Fill #1

## 2020-05-05 MED FILL — ATORVASTATIN CALCIUM 80 MG: 80 | 30 days supply | Qty: 30 | Fill #0

## 2020-05-05 MED FILL — CARVEDILOL 6.25 MG TABLET: 6.25 | 30 days supply | Qty: 60 | Fill #3

## 2020-05-09 ENCOUNTER — Other Ambulatory Visit: Payer: Self-pay | Admitting: Family Medicine

## 2020-05-09 MED FILL — GABAPENTIN 300 MG CAPSULE: 300 | 30 days supply | Qty: 60 | Fill #0

## 2020-05-09 NOTE — Telephone Encounter (Signed)
Requested Prescriptions  Pending Prescriptions Disp Refills   gabapentin (NEURONTIN) 300 MG capsule [Pharmacy Med Name: GABAPENTIN 300 MG CAPSULE 300 Capsule] 60 capsule 0    Sig: TAKE 1 CAPSULE BY MOUTH 2 TIMES DAILY.     Neurology: Anticonvulsants - gabapentin Passed - 05/09/2020 10:02 AM      Passed - Valid encounter within last 12 months    Recent Outpatient Visits          5 months ago Type 2 diabetes mellitus with hyperglycemia, with long-term current use of insulin (HCC)   [facility] [place], [place], MD   12 months ago Type 2 diabetes mellitus with hyperglycemia, with long-term current use of insulin (HCC)   [facility] [place], [place], MD   1 year ago Type 2 diabetes mellitus with other specified complication, with long-term current use of insulin (HCC)   [facility] Hoy Register, MD   1 year ago Diabetes mellitus without complication [facility])   [facility] [place], Odette Horns, MD   1 year ago Type 2 diabetes mellitus with hyperglycemia, with long-term current use of insulin (HCC)   [facility] Hoy Register, MD      Future Appointments            In 5 days Hoy Register, MD [facility]   In 2 months Revankar, Aundra Dubin, MD [facility]

## 2020-05-14 ENCOUNTER — Ambulatory Visit: Payer: Medicaid Other | Attending: Family Medicine | Admitting: Family Medicine

## 2020-05-14 ENCOUNTER — Other Ambulatory Visit: Payer: Self-pay

## 2020-05-14 ENCOUNTER — Other Ambulatory Visit: Payer: Self-pay | Admitting: Family Medicine

## 2020-05-14 ENCOUNTER — Encounter: Payer: Self-pay | Admitting: Family Medicine

## 2020-05-14 DIAGNOSIS — Z1159 Encounter for screening for other viral diseases: Secondary | ICD-10-CM | POA: Diagnosis not present

## 2020-05-14 DIAGNOSIS — F419 Anxiety disorder, unspecified: Secondary | ICD-10-CM

## 2020-05-14 DIAGNOSIS — E1165 Type 2 diabetes mellitus with hyperglycemia: Secondary | ICD-10-CM | POA: Diagnosis not present

## 2020-05-14 DIAGNOSIS — Z794 Long term (current) use of insulin: Secondary | ICD-10-CM

## 2020-05-14 DIAGNOSIS — F32A Depression, unspecified: Secondary | ICD-10-CM

## 2020-05-14 DIAGNOSIS — I10 Essential (primary) hypertension: Secondary | ICD-10-CM | POA: Diagnosis not present

## 2020-05-14 DIAGNOSIS — M792 Neuralgia and neuritis, unspecified: Secondary | ICD-10-CM

## 2020-05-14 DIAGNOSIS — R399 Unspecified symptoms and signs involving the genitourinary system: Secondary | ICD-10-CM

## 2020-05-14 MED ORDER — CARVEDILOL 6.25 MG PO TABS: 6.2500 mg | ORAL_TABLET | Freq: Two times a day (BID) | ORAL | 6 refills | Status: DC

## 2020-05-14 MED ORDER — ATORVASTATIN CALCIUM 80 MG PO TABS: 80.0000 mg | ORAL_TABLET | Freq: Every day | ORAL | 6 refills | Status: DC

## 2020-05-14 MED ORDER — GABAPENTIN 300 MG PO CAPS: 600.0000 mg | ORAL_CAPSULE | Freq: Two times a day (BID) | ORAL | 6 refills | Status: DC

## 2020-05-14 MED ORDER — LISINOPRIL 20 MG PO TABS: 20.0000 mg | ORAL_TABLET | Freq: Every day | ORAL | 6 refills | Status: DC

## 2020-05-14 MED ORDER — TAMSULOSIN HCL 0.4 MG PO CAPS: 0.4000 mg | ORAL_CAPSULE | Freq: Every day | ORAL | 6 refills | Status: DC

## 2020-05-14 MED ORDER — METFORMIN HCL 500 MG PO TABS: 1000.0000 mg | ORAL_TABLET | Freq: Two times a day (BID) | ORAL | 6 refills | Status: DC

## 2020-05-14 MED ORDER — LANTUS SOLOSTAR 100 UNIT/ML ~~LOC~~ SOPN: 40.0000 [IU] | PEN_INJECTOR | Freq: Every day | SUBCUTANEOUS | 6 refills | Status: DC

## 2020-05-14 MED ORDER — AMLODIPINE BESYLATE 10 MG PO TABS: 10.0000 mg | ORAL_TABLET | Freq: Every day | ORAL | 6 refills | Status: DC

## 2020-05-14 MED ORDER — FUROSEMIDE 40 MG PO TABS: 40.0000 mg | ORAL_TABLET | Freq: Every day | ORAL | 6 refills | Status: DC

## 2020-05-14 NOTE — Progress Notes (Signed)
States that he is not feeling well he has a fever.  States that he is getting a stabbing pain near his surgical area.(Abdomen)  States that anxiety is very bad.  Needs a letter to give to landlord so that he can keep his dog as support.

## 2020-05-14 NOTE — Progress Notes (Signed)
 Subjective:  Patient ID: Kenneth Rios, male    DOB: 07/30/1966  Age: 54 y.o. MRN: [id number]  I connected with Kenneth Rios via telephone and verified that I am speaking with the right person using two person identifiers.  Location of patient: Home Location of Clinician: Clinic  Persons participating in call: Ayicia Farrington-CMA Elishua Copland Dr. 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 States his hypertension is "going through the roof" and his depression as well due to psychosocial stressors. He has sharp pains and pain like a "fist punch" at the site of his colostomy reversal which is intermittent with associated gas pains which. Declines medication for anxiety and depression. He got a puppy which helps and provides emotional support and he would like a letter to keep his puppy.  Endorses compliance with his diabetic medications. He is always dyspneic he states even at rest but has no chest pain; endorses compliance with his cardiac medications and antihypertensives.  Past Medical History:  Diagnosis Date  . (HFpEF) heart failure with preserved ejection fraction (HCC) 03/30/2018  . Accelerated hypertension 06/05/2018  . Acute diverticulitis 08/29/2017  . Acute viral bronchitis 08/24/2017  . Angina pectoris (HCC) 03/29/2018  . Anxiety   . Anxiety and depression 02/06/2018  . Asthma with status asthmaticus   . Chest tightness 03/29/2018  . CHF (congestive heart failure) (HCC)    "fluttering"  . Depression   .  Diabetes mellitus without complication (HCC)   . Diverticulitis   . Diverticulitis large intestine 05/31/2018  . GERD (gastroesophageal reflux disease)   . Hyperlipidemia   . Hypertension   . Hypertensive urgency 08/24/2017  . Microcytic anemia 12/19/2018  . Palpitations 03/29/2018  . Peritonitis (HCC) 09/02/2017  . Pneumonia 12/16/2018  . Polysubstance abuse (HCC) 12/19/2018  . Prolonged Q-T interval on ECG 02/06/2018  . S/P colostomy (HCC) 09/16/2017  . Unstable angina (HCC) 03/29/2018    Past Surgical History:  Procedure Laterality Date  . CARDIAC CATHETERIZATION  03/30/2018  . COLON RESECTION N/A 09/05/2017   Procedure: HARTMAN'S COLECTOMY AND COLOSTOMY;  Surgeon: [name], Luke Aaron, MD;  [facility];  Service: General;  Laterality: N/A;  . COLON SURGERY    . COLOSTOMY TAKEDOWN N/A 05/31/2018   Procedure: LAPAROSCOPIC COLOSTOMY REVERSAL COLORECTAL ANASTOMOSIS ERAS PATHWAY;  Surgeon: [name], Luke Aaron, MD;  Location: [facility];  Service: General;  Laterality: N/A;  . INTRAVASCULAR PRESSURE WIRE/FFR STUDY N/A 03/30/2018   Procedure: INTRAVASCULAR PRESSURE WIRE/FFR STUDY;  Surgeon: End, Christopher, MD;  [facility];  Service: Cardiovascular;  Laterality: N/A;  . LEFT HEART CATH AND CORONARY ANGIOGRAPHY N/A 03/30/2018   Procedure: LEFT HEART CATH AND CORONARY ANGIOGRAPHY;  Surgeon: End, Christopher, MD;  [facility];  Service: Cardiovascular;  Laterality: N/A;    Family History  Problem Relation Age of Onset  . Heart disease Mother   . Heart disease Sister   . Diabetes Maternal Aunt   . Heart disease Maternal Aunt   . Diabetes Maternal Uncle   . Sudden Cardiac Death Neg Hx       No Known Allergies  Outpatient Medications Prior to Visit  Medication Sig Dispense Refill  . blood glucose meter kit and supplies Dispense based on patient and insurance preference. Use up to four times daily as directed. (FOR ICD-10 E10.9, E11.9). 1 each 0  . Blood  Glucose Monitoring Suppl (ACCU-CHEK AVIVA) device Use as instructed 3 times daily. 1 each 0  . cetirizine (ZYRTEC) 10 MG tablet TAKE 1 TABLET (10 MG TOTAL) BY MOUTH DAILY. 30 tablet 2  . fluticasone (FLONASE) 50 MCG/ACT nasal spray Place 2 sprays into both nostrils daily. 16 g 1  . glucose blood (ACCU-CHEK AVIVA) test strip Use 3 times daily 100 each 12  . Insulin Pen Needle 31G X 5 MM MISC Inject insulin via insulin pen once daily 100 each 6  . Lancets (ACCU-CHEK SOFT TOUCH) lancets Use as instructed 3 times daily 100 each 12  . sildenafil (VIAGRA) 50 MG tablet Take 1 tablet (50 mg total) by mouth daily as needed for erectile dysfunction. At least 24 hrs between doses.  Don't take with nitroglycerin 10 tablet 0  . amLODipine (NORVASC) 10 MG tablet Take 1 tablet (10 mg total) by mouth daily. 30 tablet 6  . atorvastatin (LIPITOR) 80 MG tablet Take 1 tablet (80 mg total) by mouth daily. 30 tablet 6  . carvedilol (COREG) 6.25 MG tablet Take 1 tablet (6.25 mg total) by mouth 2 (two) times daily with a meal. 60 tablet 6  . furosemide (LASIX) 40 MG tablet Take 1 tablet (40 mg total) by mouth daily. 30 tablet 6  . gabapentin (NEURONTIN) 300 MG capsule TAKE 1 CAPSULE BY MOUTH 2 TIMES DAILY. 60 capsule 0  . insulin glargine (LANTUS SOLOSTAR) 100 UNIT/ML Solostar Pen Inject 40 Units into the skin daily. 30 mL 6  . lisinopril (ZESTRIL) 20 MG tablet Take 1 tablet (20 mg total) by mouth daily. 30 tablet 6  . metFORMIN (GLUCOPHAGE) 500 MG tablet Take 2 tablets (1,000 mg total) by mouth 2 (two) times daily with a meal. 120 tablet 6  . tamsulosin (FLOMAX) 0.4 MG CAPS capsule Take 1 capsule (0.4 mg total) by mouth daily. 30 capsule 6   No facility-administered medications prior to visit.     ROS Review of Systems  Constitutional: Negative for activity change and appetite change.  HENT: Negative for sinus pressure and sore throat.   Eyes: Negative for visual disturbance.  Respiratory: Positive for shortness  of breath. Negative for cough and chest tightness.   Cardiovascular: Negative for chest pain and leg swelling.  Gastrointestinal: Negative for abdominal distention, abdominal pain, constipation and diarrhea.  Endocrine: Negative.   Genitourinary: Negative for dysuria.  Musculoskeletal: Negative for joint swelling and myalgias.  Skin: Negative for rash.  Allergic/Immunologic: Negative.   Neurological: Negative for weakness, light-headedness and numbness.  Psychiatric/Behavioral: Negative for dysphoric mood and suicidal ideas.       Positive for anxiety and depression    Objective:  There were no vitals taken for this visit.  BP/Weight 01/09/2020 11/26/2019 93/02/9023  Systolic BP 097 353 299  Diastolic BP 90 89 93  Wt. (Lbs) 197 193 205  BMI 29.09 28.5 30.27      Physical exam: Awake alert oriented x3 Not in acute distress Psych: Dysphoric mood  CMP Latest Ref Rng & Units 11/26/2019 01/18/2019 06/20/2018  Glucose 65 - 99 mg/dL 568(HH) 103(H) 135(H)  BUN 6 - 24 mg/dL _0 Creatinine 0.76 - 1.27 mg/dL 1.30(H) 1.23 0.99  Sodium 134 -  144 mmol/L 134 138 142  Potassium 3.5 - 5.2 mmol/L 4.9 4.0 4.6  Chloride 96 - 106 mmol/L 92(L) 102 102  CO2 20 - 29 mmol/L _0 Calcium 8.7 - 10.2 mg/dL 10.5(H) 8.8 9.6  Total Protein 6.0 - 8.5 g/dL - 6.8 -  Total Bilirubin 0.0 - 1.2 mg/dL - 0.4 -  Alkaline Phos 39 - 117 IU/L - 97 -  AST 0 - 40 IU/L - 16 -  ALT 0 - 44 IU/L - 30 -    Lipid Panel     Component Value Date/Time   CHOL 155 01/18/2019 0957   TRIG 75 01/18/2019 0957   HDL 48 01/18/2019 0957   CHOLHDL 3.2 01/18/2019 0957   CHOLHDL 6.1 03/31/2018 0227   VLDL UNABLE TO CALCULATE IF TRIGLYCERIDE OVER 400 mg/dL 03/31/2018 0227   LDLCALC 92 01/18/2019 0957    CBC    Component Value Date/Time   WBC 5.8 06/05/2018 0820   RBC 3.35 (L) 06/05/2018 0820   HGB 9.2 (L) 06/05/2018 0820   HGB 14.9 03/29/2018 1120   HCT 27.2 (L) 06/05/2018 0820   HCT 42.2 03/29/2018 1120   PLT  331 06/05/2018 0820   PLT 300 03/29/2018 1120   MCV 81.2 06/05/2018 0820   MCV 84 03/29/2018 1120   MCV 82 03/14/2014 0400   MCH 27.5 06/05/2018 0820   MCHC 33.8 06/05/2018 0820   RDW 12.5 06/05/2018 0820   RDW 13.7 03/29/2018 1120   RDW 13.5 03/14/2014 0400   LYMPHSABS 2.4 06/05/2018 0820   LYMPHSABS 0.9 (L) 03/14/2014 0400   MONOABS 0.4 06/05/2018 0820   MONOABS 0.1 (L) 03/14/2014 0400   EOSABS 0.4 06/05/2018 0820   EOSABS 0.0 03/14/2014 0400   BASOSABS 0.0 06/05/2018 0820   BASOSABS 0.0 03/14/2014 0400    Lab Results  Component Value Date   HGBA1C 12.3 (H) 11/26/2019    Assessment & Plan:  1. Anxiety and depression We have discussed initiation of medication and possible psychotherapy which he declines. He states he has his puppy for emotional support I have provided letter as requested   2. Lower urinary tract symptoms Stable - tamsulosin (FLOMAX) 0.4 MG CAPS capsule; Take 1 capsule (0.4 mg total) by mouth daily.  Dispense: 30 capsule; Refill: 6  3. Type 2 diabetes mellitus with hyperglycemia, with long-term current use of insulin (HCC) Uncontrolled We will send of A1c and adjust regimen accordingly Counseled on Diabetic diet, my plate method, 063 minutes of moderate intensity exercise/week Blood sugar logs with fasting goals of 80-120 mg/dl, random of less than 180 and in the event of sugars less than 60 mg/dl or greater than 400 mg/dl encouraged to notify the clinic. Advised on the need for annual eye exams, annual foot exams, Pneumonia vaccine. - Hemoglobin A1c; Future - CMP14+EGFR; Future - Lipid panel; Future - Microalbumin / creatinine urine ratio; Future - metFORMIN (GLUCOPHAGE) 500 MG tablet; Take 2 tablets (1,000 mg total) by mouth 2 (two) times daily with a meal.  Dispense: 120 tablet; Refill: 6 - insulin glargine (LANTUS SOLOSTAR) 100 UNIT/ML Solostar Pen; Inject 40 Units into the skin daily.  Dispense: 30 mL; Refill: 6 - furosemide (LASIX) 40 MG tablet;  Take 1 tablet (40 mg total) by mouth daily.  Dispense: 30 tablet; Refill: 6 - atorvastatin (LIPITOR) 80 MG tablet; Take 1 tablet (80 mg total) by mouth daily.  Dispense: 30 tablet; Refill: 6  4. Essential hypertension Blood pressure at last office visit was normal  Counseled on blood pressure goal of less than 130/80, low-sodium, DASH diet, medication compliance, 150 minutes of moderate intensity exercise per week. Discussed medication compliance, adverse effects. - lisinopril (ZESTRIL) 20 MG tablet; Take 1 tablet (20 mg total) by mouth daily.  Dispense: 30 tablet; Refill: 6 - carvedilol (COREG) 6.25 MG tablet; Take 1 tablet (6.25 mg total) by mouth 2 (two) times daily with a meal.  Dispense: 60 tablet; Refill: 6 - amLODipine (NORVASC) 10 MG tablet; Take 1 tablet (10 mg total) by mouth daily.  Dispense: 30 tablet; Refill: 6  5. Need for hepatitis C screening test - HCV RNA quant rflx ultra or genotyp([facility]); Future  6. Neuropathic pain Increased dose of gabapentin as he could be having neuropathic pain at previous site of his abdominal surgery - gabapentin (NEURONTIN) 300 MG capsule; Take 2 capsules (600 mg total) by mouth 2 (two) times daily.  Dispense: 120 capsule; Refill: 6   Meds ordered this encounter  Medications  . gabapentin (NEURONTIN) 300 MG capsule    Sig: Take 2 capsules (600 mg total) by mouth 2 (two) times daily.    Dispense:  120 capsule    Refill:  6    Dose increase  . tamsulosin (FLOMAX) 0.4 MG CAPS capsule    Sig: Take 1 capsule (0.4 mg total) by mouth daily.    Dispense:  30 capsule    Refill:  6  . metFORMIN (GLUCOPHAGE) 500 MG tablet    Sig: Take 2 tablets (1,000 mg total) by mouth 2 (two) times daily with a meal.    Dispense:  120 tablet    Refill:  6  . lisinopril (ZESTRIL) 20 MG tablet    Sig: Take 1 tablet (20 mg total) by mouth daily.    Dispense:  30 tablet    Refill:  6  . insulin glargine (LANTUS SOLOSTAR) 100 UNIT/ML Solostar Pen    Sig:  Inject 40 Units into the skin daily.    Dispense:  30 mL    Refill:  6    Dose increase  . furosemide (LASIX) 40 MG tablet    Sig: Take 1 tablet (40 mg total) by mouth daily.    Dispense:  30 tablet    Refill:  6  . carvedilol (COREG) 6.25 MG tablet    Sig: Take 1 tablet (6.25 mg total) by mouth 2 (two) times daily with a meal.    Dispense:  60 tablet    Refill:  6  . atorvastatin (LIPITOR) 80 MG tablet    Sig: Take 1 tablet (80 mg total) by mouth daily.    Dispense:  30 tablet    Refill:  6  . amLODipine (NORVASC) 10 MG tablet    Sig: Take 1 tablet (10 mg total) by mouth daily.    Dispense:  30 tablet    Refill:  6    Follow-up: Return in about 3 months (around 08/14/2020) for Chronic disease management; fasting labs on 05/19/2020.     Total of 23 minutes spent in reviewing chart, conducting encounter, medical decision making and patient was provided the opportunity to ask questions at the end of the visit.  Charlott Rakes, MD, FAAFP. [facility], [facility], [facility] [place], [place]   05/14/2020, 2:42 PM

## 2020-05-15 MED FILL — LANTUS SOLOSTAR 100 UNITS/M: 100 | 30 days supply | Qty: 12 | Fill #0

## 2020-05-19 ENCOUNTER — Ambulatory Visit: Payer: Medicaid Other | Attending: Family Medicine

## 2020-05-19 ENCOUNTER — Other Ambulatory Visit: Payer: Self-pay

## 2020-05-19 DIAGNOSIS — E1165 Type 2 diabetes mellitus with hyperglycemia: Secondary | ICD-10-CM | POA: Diagnosis not present

## 2020-05-19 DIAGNOSIS — Z794 Long term (current) use of insulin: Secondary | ICD-10-CM | POA: Diagnosis not present

## 2020-05-19 DIAGNOSIS — Z1159 Encounter for screening for other viral diseases: Secondary | ICD-10-CM

## 2020-05-20 LAB — CMP14+EGFR
ALT: 13 IU/L (ref 0–44)
AST: 10 IU/L (ref 0–40)
Albumin/Globulin Ratio: 1.7 (ref 1.2–2.2)
Albumin: 4.5 g/dL (ref 3.8–4.9)
Alkaline Phosphatase: 81 IU/L (ref 44–121)
BUN/Creatinine Ratio: 10 (ref 9–20)
BUN: 11 mg/dL (ref 6–24)
Bilirubin Total: 0.4 mg/dL (ref 0.0–1.2)
CO2: 25 mmol/L (ref 20–29)
Calcium: 9.6 mg/dL (ref 8.7–10.2)
Chloride: 99 mmol/L (ref 96–106)
Creatinine, Ser: 1.14 mg/dL (ref 0.76–1.27)
GFR calc Af Amer: 84 mL/min/{1.73_m2} (ref >59)
GFR calc non Af Amer: 73 mL/min/{1.73_m2} (ref >59)
Globulin, Total: 2.7 g/dL (ref 1.5–4.5)
Glucose: 360 mg/dL — ABNORMAL HIGH (ref 65–99)
Potassium: 4.6 mmol/L (ref 3.5–5.2)
Sodium: 136 mmol/L (ref 134–144)
Total Protein: 7.2 g/dL (ref 6.0–8.5)

## 2020-05-20 LAB — HEMOGLOBIN A1C
Est. average glucose Bld gHb Est-mCnc: 392 mg/dL
Hgb A1c MFr Bld: 15.3 % — ABNORMAL HIGH (ref 4.8–5.6)

## 2020-05-20 LAB — LIPID PANEL
Chol/HDL Ratio: 3.6 ratio (ref 0.0–5.0)
Cholesterol, Total: 189 mg/dL (ref 100–199)
HDL: 53 mg/dL (ref >39)
LDL Chol Calc (NIH): 119 mg/dL — ABNORMAL HIGH (ref 0–99)
Triglycerides: 95 mg/dL (ref 0–149)
VLDL Cholesterol Cal: 17 mg/dL (ref 5–40)

## 2020-05-20 LAB — HCV RNA QUANT RFLX ULTRA OR GENOTYP: HCV Quant Baseline: NOT DETECTED IU/mL

## 2020-05-20 LAB — MICROALBUMIN / CREATININE URINE RATIO
Creatinine, Urine: 95.9 mg/dL
Microalb/Creat Ratio: 8 mg/g creat (ref 0–29)
Microalbumin, Urine: 7.8 ug/mL

## 2020-05-21 ENCOUNTER — Other Ambulatory Visit: Payer: Self-pay | Admitting: Family Medicine

## 2020-05-21 DIAGNOSIS — E1165 Type 2 diabetes mellitus with hyperglycemia: Secondary | ICD-10-CM

## 2020-05-21 MED ORDER — LANTUS SOLOSTAR 100 UNIT/ML ~~LOC~~ SOPN: 50.0000 [IU] | PEN_INJECTOR | Freq: Every day | SUBCUTANEOUS | 6 refills | Status: DC

## 2020-05-23 ENCOUNTER — Telehealth: Payer: Self-pay

## 2020-05-23 NOTE — Telephone Encounter (Signed)
-----   Message from Hoy Register, MD sent at 05/21/2020  2:54 PM EDT ----- A1c significantly evaded at greater than 15.3 previously at 12.3.  Please advised him I have increased Lantus to 50 units at bedtime I will need him to be compliant with his medications.  Bad cholesterol is also elevated.  Compliance with a low-cholesterol diet is imperative.

## 2020-05-23 NOTE — Telephone Encounter (Signed)
Patient has viewed results via mychart  

## 2020-07-01 ENCOUNTER — Ambulatory Visit: Payer: Medicaid Other | Admitting: Family Medicine

## 2020-07-07 MED FILL — FUROSEMIDE 40 MG TAB: 40 | 30 days supply | Qty: 30 | Fill #2

## 2020-07-07 MED FILL — METFORMIN HCL 500 MG TABS: 500 | 30 days supply | Qty: 120 | Fill #2

## 2020-07-07 MED FILL — ATORVASTATIN CALCIUM 80 MG: 80 | 30 days supply | Qty: 30 | Fill #1

## 2020-07-07 MED FILL — LISINOPRIL 20 MG TABLET: 20 | 90 days supply | Qty: 90 | Fill #0

## 2020-07-07 MED FILL — TAMSULOSIN HCL 0.4 MG CAP: 0.4 | 30 days supply | Qty: 30 | Fill #2

## 2020-07-07 MED FILL — GABAPENTIN 300 MG CAPSULE: 300 | 30 days supply | Qty: 120 | Fill #0

## 2020-07-07 MED FILL — CARVEDILOL 6.25 MG TABLET: 6.25 | 90 days supply | Qty: 180 | Fill #0

## 2020-07-11 DIAGNOSIS — K219 Gastro-esophageal reflux disease without esophagitis: Secondary | ICD-10-CM | POA: Insufficient documentation

## 2020-07-11 DIAGNOSIS — E785 Hyperlipidemia, unspecified: Secondary | ICD-10-CM | POA: Insufficient documentation

## 2020-07-11 DIAGNOSIS — K5792 Diverticulitis of intestine, part unspecified, without perforation or abscess without bleeding: Secondary | ICD-10-CM | POA: Insufficient documentation

## 2020-07-11 DIAGNOSIS — F419 Anxiety disorder, unspecified: Secondary | ICD-10-CM | POA: Insufficient documentation

## 2020-07-11 DIAGNOSIS — F32A Depression, unspecified: Secondary | ICD-10-CM | POA: Insufficient documentation

## 2020-07-14 ENCOUNTER — Ambulatory Visit: Payer: Medicaid Other | Admitting: Cardiology

## 2020-07-23 MED FILL — TAMSULOSIN HCL 0.4 MG CAP: 0.4 | 30 days supply | Qty: 30 | Fill #0

## 2020-07-23 MED FILL — METFORMIN HCL 500 MG TABS: 500 | 30 days supply | Qty: 120 | Fill #0

## 2020-07-23 MED FILL — LISINOPRIL 20 MG TABLET: 20 | 90 days supply | Qty: 90 | Fill #0

## 2020-07-23 MED FILL — FUROSEMIDE 40 MG TAB: 40 | 30 days supply | Qty: 30 | Fill #0

## 2020-07-23 MED FILL — GABAPENTIN 300 MG CAPSULE: 300 | 30 days supply | Qty: 120 | Fill #0

## 2020-07-23 MED FILL — CARVEDILOL 6.25 MG TABLET: 6.25 | 90 days supply | Qty: 180 | Fill #0

## 2020-07-23 MED FILL — ATORVASTATIN CALCIUM 80 MG: 80 | 30 days supply | Qty: 30 | Fill #0

## 2020-08-20 ENCOUNTER — Other Ambulatory Visit: Payer: Self-pay

## 2020-08-20 ENCOUNTER — Ambulatory Visit: Payer: Medicaid Other | Attending: Family Medicine | Admitting: Family Medicine

## 2020-08-20 DIAGNOSIS — E1165 Type 2 diabetes mellitus with hyperglycemia: Secondary | ICD-10-CM | POA: Diagnosis not present

## 2020-08-20 DIAGNOSIS — Z794 Long term (current) use of insulin: Secondary | ICD-10-CM

## 2020-08-20 NOTE — Progress Notes (Signed)
Virtual Visit via Telephone Note  I connected with Kenneth Rios, on 08/20/2020 at 2:49 PM by telephone due to the COVID-19 pandemic and verified that I am speaking with the correct person using two identifiers.   Consent: I discussed the limitations, risks, security and privacy concerns of performing an evaluation and management service by telephone and the availability of in person appointments. I also discussed with the patient that there may be a patient responsible charge related to this service. The patient expressed understanding and agreed to proceed.   Location of Patient: Home  Location of Provider: Home   Persons participating in Telemedicine visit: Twain Stenseth Farrington-CMA Dr. 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 He informs me has been on 30 units daily. He is not consistent in taking his insulin at it depends on if he is able to afford it or not and at some other times he does not take it and when he fells 'some kind of way' then he takes it. He has had some lightheadedness and dizziness not precipitated by any activity which resolved spontaneously but he denies headaches , blurry vision or vertigo. Past Medical History:  Diagnosis Date  . (HFpEF) heart failure with preserved ejection fraction ([facility] 03/30/2018  . Accelerated hypertension 06/05/2018  . Acute diverticulitis 08/29/2017  . Acute viral bronchitis 08/24/2017  . Angina pectoris ([facility] [place] 03/29/2018  . Anxiety   . Anxiety and depression  02/06/2018  . Asthma with status asthmaticus   . Chest tightness 03/29/2018  . CHF (congestive heart failure) ([facility]    "fluttering"  . Depression   . Diabetes mellitus due to underlying condition with unspecified complications ([facility] 61/01/1095 . Diabetes mellitus without complication ([facility]   . Diverticulitis   . Diverticulitis large intestine 05/31/2018  . GERD (gastroesophageal reflux disease)   . Hyperlipidemia   . Hypertension   . Hypertensive urgency 08/24/2017  . Microcytic anemia 12/19/2018  . Mixed dyslipidemia 01/09/2020  . Palpitations 03/29/2018  . Peritonitis ([facility] 09/02/2017  . Pneumonia 12/16/2018  . Polysubstance abuse ([facility] [place] 12/19/2018  . Prolonged Q-T interval on ECG 02/06/2018  . S/P colostomy ([facility] 09/16/2017  . Unstable angina ([facility] 03/29/2018   No Known Allergies  Current Outpatient Medications on File Prior to Visit  Medication Sig Dispense Refill  . amLODipine (NORVASC) 10 MG tablet Take 1 tablet (10 mg total) by mouth daily. 30 tablet 6  . atorvastatin (LIPITOR) 80 MG tablet Take 1 tablet (80 mg total) by mouth daily. 30 tablet 6  . blood glucose meter kit and supplies Dispense based on patient and insurance preference. Use up to four times daily as directed. (FOR ICD-10 E10.9, E11.9). 1 each 0  . Blood Glucose Monitoring Suppl (ACCU-CHEK AVIVA) device Use as instructed 3 times daily. 1 each 0  . carvedilol (COREG) 6.25 MG tablet Take 1 tablet (6.25 mg total) by mouth 2 (two) times daily with a meal. 60 tablet 6  . cetirizine (ZYRTEC) 10 MG tablet TAKE 1 TABLET (10 MG TOTAL) BY MOUTH DAILY. 30 tablet 2  .  fluticasone (FLONASE) 50 MCG/ACT nasal spray Place 2 sprays into both nostrils daily. 16 g 1  . furosemide (LASIX) 40 MG tablet Take 1 tablet (40 mg total) by mouth daily. 30 tablet 6  . gabapentin (NEURONTIN) 300 MG capsule Take 2 capsules (600 mg total) by mouth 2 (two) times daily. 120 capsule 6  . glucose blood (ACCU-CHEK AVIVA) test strip Use 3 times daily 100 each 12   . insulin glargine (LANTUS SOLOSTAR) 100 UNIT/ML Solostar Pen Inject 50 Units into the skin daily. 30 mL 6  . Insulin Pen Needle 31G X 5 MM MISC Inject insulin via insulin pen once daily 100 each 6  . Lancets (ACCU-CHEK SOFT TOUCH) lancets Use as instructed 3 times daily 100 each 12  . lisinopril (ZESTRIL) 20 MG tablet Take 1 tablet (20 mg total) by mouth daily. 30 tablet 6  . metFORMIN (GLUCOPHAGE) 500 MG tablet Take 2 tablets (1,000 mg total) by mouth 2 (two) times daily with a meal. 120 tablet 6  . sildenafil (VIAGRA) 50 MG tablet Take 1 tablet (50 mg total) by mouth daily as needed for erectile dysfunction. At least 24 hrs between doses.  Don't take with nitroglycerin 10 tablet 0  . tamsulosin (FLOMAX) 0.4 MG CAPS capsule Take 1 capsule (0.4 mg total) by mouth daily. 30 capsule 6   No current facility-administered medications on file prior to visit.    Observations/Objective: Awake, alert, oriented x3 Not in acute distress  CMP Latest Ref Rng & Units 05/19/2020 11/26/2019 01/18/2019  Glucose 65 - 99 mg/dL 360(H) 568(HH) 103(H)  BUN 6 - 24 mg/dL _0 Creatinine 0.76 - 1.27 mg/dL 1.14 1.30(H) 1.23  Sodium 134 - 144 mmol/L 136 134 138  Potassium 3.5 - 5.2 mmol/L 4.6 4.9 4.0  Chloride 96 - 106 mmol/L 99 92(L) 102  CO2 20 - 29 mmol/L _1 Calcium 8.7 - 10.2 mg/dL 9.6 10.5(H) 8.8  Total Protein 6.0 - 8.5 g/dL 7.2 - 6.8  Total Bilirubin 0.0 - 1.2 mg/dL 0.4 - 0.4  Alkaline Phos 44 - 121 IU/L 81 - 97  AST 0 - 40 IU/L 10 - 16  ALT 0 - 44 IU/L 13 - 30    Lipid Panel     Component Value Date/Time   CHOL 189 05/19/2020 1015   TRIG 95 05/19/2020 1015   HDL 53 05/19/2020 1015   CHOLHDL 3.6 05/19/2020 1015   CHOLHDL 6.1 03/31/2018 0227   VLDL UNABLE TO CALCULATE IF TRIGLYCERIDE OVER 400 mg/dL 03/31/2018 0227   LDLCALC 119 (H) 05/19/2020 1015   LABVLDL 17 05/19/2020 1015    Lab Results  Component Value Date   HGBA1C 15.3 (H) 05/19/2020    Assessment and Plan: 1. Type 2  diabetes mellitus with hyperglycemia, with long-term current use of insulin (HCC) Uncontrolled with A1c of 15.3 Non compliance largely responsible Since he has been administering 30 rather than 50 units of Lantus I have advised him to increase to 35 units and if fasting sugars remain in the 200s increase to 40 units Counseled on Diabetic diet, my plate method, 694 minutes of moderate intensity exercise/week Blood sugar logs with fasting goals of 80-120 mg/dl, random of less than 180 and in the event of sugars less than 60 mg/dl or greater than 400 mg/dl encouraged to notify the clinic. Advised on the need for annual eye exams, annual foot exams, Pneumonia vaccine.   2. Lightheadedness Symptoms are not suspicious for vertigo Advised to check  blood pressure and blood sugar when this occurs Change position slowly If symptoms persist, consider additional work up   Follow Up Instructions: 3 months   I discussed the assessment and treatment plan with the patient. The patient was provided an opportunity to ask questions and all were answered. The patient agreed with the plan and demonstrated an understanding of the instructions.   The patient was advised to call back or seek an in-person evaluation if the symptoms worsen or if the condition fails to improve as anticipated.     I provided 14 minutes total of non-face-to-face time during this encounter including median intraservice time, reviewing previous notes, investigations, ordering medications, medical decision making, coordinating care and patient verbalized understanding at the end of the visit.     Charlott Rakes, MD, FAAFP. [facility] [place], [place]   08/20/2020, 2:49 PM

## 2020-08-20 NOTE — Progress Notes (Signed)
He gets light headed and dizzy every now and then.

## 2020-09-01 ENCOUNTER — Telehealth: Payer: Self-pay | Admitting: Cardiology

## 2020-09-01 ENCOUNTER — Encounter: Payer: Self-pay | Admitting: Cardiology

## 2020-09-01 ENCOUNTER — Telehealth (INDEPENDENT_AMBULATORY_CARE_PROVIDER_SITE_OTHER): Payer: Medicaid Other | Admitting: Cardiology

## 2020-09-01 VITALS — BP 136/102 | Ht 69.0 in | Wt 200.0 lb

## 2020-09-01 DIAGNOSIS — I10 Essential (primary) hypertension: Secondary | ICD-10-CM

## 2020-09-01 DIAGNOSIS — I209 Angina pectoris, unspecified: Secondary | ICD-10-CM | POA: Diagnosis not present

## 2020-09-01 DIAGNOSIS — E088 Diabetes mellitus due to underlying condition with unspecified complications: Secondary | ICD-10-CM

## 2020-09-01 DIAGNOSIS — E782 Mixed hyperlipidemia: Secondary | ICD-10-CM

## 2020-09-01 NOTE — Progress Notes (Signed)
Virtual Visit via Telephone Note   This visit type was conducted due to national recommendations for restrictions regarding the COVID-19 Pandemic (e.g. social distancing) in an effort to limit this patient's exposure and mitigate transmission in our community.  Due to his co-morbid illnesses, this patient is at least at moderate risk for complications without adequate follow up.  This format is felt to be most appropriate for this patient at this time.  The patient did not have access to video technology/had technical difficulties with video requiring transitioning to audio format only (telephone).  All issues noted in this document were discussed and addressed.  No physical exam could be performed with this format.  Please refer to the patient's chart for his  consent to telehealth for [facility].    Date:  09/01/2020   ID:  Kenneth Rios, DOB 09-10-1965, MRN [id number] The patient was identified using 2 identifiers.  Patient Location: Home Provider Location: Office/Clinic  PCP:  Hoy Register, MD  Cardiologist:  Garwin Brothers, MD  Electrophysiologist:  None   Evaluation Performed:  Follow-Up Visit  Chief Complaint: Coronary artery disease and essential hypertension  History of Present Illness:    Kenneth Rios is a 55 y.o. male with past medical history of coronary artery disease, essential hypertension dyslipidemia and diabetes mellitus.  Patient mentions to me that he could not come for the visit today.  He mentions to me that because of his financial difficulties his car was repossessed and therefore he would not be able to make the visit and wanted to make a telephone call visit.  He denies any chest pain orthopnea or PND.  He leads a sedentary lifestyle.  He mentions that he is taking his medications on and off and I told him my concern about this.  He is not sure what he is taking his medications today.  The patient does not have symptoms concerning for COVID-19  infection (fever, chills, cough, or new shortness of breath).    Past Medical History:  Diagnosis Date  . (HFpEF) heart failure with preserved ejection fraction (HCC) 03/30/2018  . Accelerated hypertension 06/05/2018  . Acute diverticulitis 08/29/2017  . Acute viral bronchitis 08/24/2017  . Angina pectoris (HCC) 03/29/2018  . Anxiety   . Anxiety and depression 02/06/2018  . Asthma with status asthmaticus   . Chest tightness 03/29/2018  . CHF (congestive heart failure) (HCC)    "fluttering"  . Depression   . Diabetes mellitus due to underlying condition with unspecified complications (HCC) 01/09/2020  . Diabetes mellitus without complication (HCC)   . Diverticulitis   . Diverticulitis large intestine 05/31/2018  . GERD (gastroesophageal reflux disease)   . Hyperlipidemia   . Hypertension   . Hypertensive urgency 08/24/2017  . Microcytic anemia 12/19/2018  . Mixed dyslipidemia 01/09/2020  . Palpitations 03/29/2018  . Peritonitis (HCC) 09/02/2017  . Pneumonia 12/16/2018  . Polysubstance abuse (HCC) 12/19/2018  . Prolonged Q-T interval on ECG 02/06/2018  . S/P colostomy (HCC) 09/16/2017  . Unstable angina (HCC) 03/29/2018   Past Surgical History:  Procedure Laterality Date  . CARDIAC CATHETERIZATION  03/30/2018  . COLON RESECTION N/A 09/05/2017   Procedure: HARTMAN'S COLECTOMY AND COLOSTOMY;  Surgeon: Sheliah Hatch De Blanch, MD;  Location: [facility] OR;  Service: General;  Laterality: N/A;  . COLON SURGERY    . COLOSTOMY TAKEDOWN N/A 05/31/2018   Procedure: LAPAROSCOPIC COLOSTOMY REVERSAL COLORECTAL ANASTOMOSIS ERAS PATHWAY;  Surgeon: [name], De Blanch, MD;  Location: [facility] OR;  Service: General;  Laterality: N/A;  . INTRAVASCULAR PRESSURE WIRE/FFR STUDY N/A 03/30/2018   Procedure: INTRAVASCULAR PRESSURE WIRE/FFR STUDY;  Surgeon: Yvonne Kendall, MD;  Location: [facility] INVASIVE CV LAB;  Service: Cardiovascular;  Laterality: N/A;  . LEFT HEART CATH AND CORONARY ANGIOGRAPHY N/A 03/30/2018   Procedure: LEFT HEART  CATH AND CORONARY ANGIOGRAPHY;  Surgeon: Yvonne Kendall, MD;  Location: [facility] INVASIVE CV LAB;  Service: Cardiovascular;  Laterality: N/A;     Current Meds  Medication Sig  . amLODipine (NORVASC) 10 MG tablet Take 1 tablet (10 mg total) by mouth daily.  Marland Kitchen atorvastatin (LIPITOR) 80 MG tablet Take 1 tablet (80 mg total) by mouth daily.  . carvedilol (COREG) 6.25 MG tablet Take 1 tablet (6.25 mg total) by mouth 2 (two) times daily with a meal.  . cetirizine (ZYRTEC) 10 MG tablet TAKE 1 TABLET (10 MG TOTAL) BY MOUTH DAILY.  . fluticasone (FLONASE) 50 MCG/ACT nasal spray Place 2 sprays into both nostrils daily.  . furosemide (LASIX) 40 MG tablet Take 1 tablet (40 mg total) by mouth daily.  Marland Kitchen gabapentin (NEURONTIN) 300 MG capsule Take 2 capsules (600 mg total) by mouth 2 (two) times daily.  . insulin glargine (LANTUS SOLOSTAR) 100 UNIT/ML Solostar Pen Inject 50 Units into the skin daily.  Marland Kitchen lisinopril (ZESTRIL) 20 MG tablet Take 1 tablet (20 mg total) by mouth daily.  . metFORMIN (GLUCOPHAGE) 500 MG tablet Take 2 tablets (1,000 mg total) by mouth 2 (two) times daily with a meal.  . sildenafil (VIAGRA) 50 MG tablet Take 1 tablet (50 mg total) by mouth daily as needed for erectile dysfunction. At least 24 hrs between doses.  Don't take with nitroglycerin  . tamsulosin (FLOMAX) 0.4 MG CAPS capsule Take 1 capsule (0.4 mg total) by mouth daily.     Allergies:   Patient has no known allergies.   Social History   Tobacco Use  . Smoking status: Former Games developer  . Smokeless tobacco: Never Used  Vaping Use  . Vaping Use: Never used  Substance Use Topics  . Alcohol use: Yes    Alcohol/week: 6.0 standard drinks    Types: 6 Cans of beer per week    Comment: on the weekends  . Drug use: No     Family Hx: The patient's family history includes Diabetes in his maternal aunt and maternal uncle; Heart disease in his maternal aunt, mother, and sister. There is no history of Sudden Cardiac Death.  ROS:    Please see the history of present illness.    I discussed my findings with the patient at length. All other systems reviewed and are negative.   Prior CV studies:   The following studies were reviewed today:  Coronary angiography report was reviewed.  Labs/Other Tests and Data Reviewed:    EKG:  EKG from previous visit was reviewed.  Recent Labs: 05/19/2020: ALT 13; BUN 11; Creatinine, Ser 1.14; Potassium 4.6; Sodium 136   Recent Lipid Panel Lab Results  Component Value Date/Time   CHOL 189 05/19/2020 10:15 AM   TRIG 95 05/19/2020 10:15 AM   HDL 53 05/19/2020 10:15 AM   CHOLHDL 3.6 05/19/2020 10:15 AM   CHOLHDL 6.1 03/31/2018 02:27 AM   LDLCALC 119 (H) 05/19/2020 10:15 AM    Wt Readings from Last 3 Encounters:  09/01/20 200 lb (90.7 kg)  01/09/20 197 lb (89.4 kg)  11/26/19 193 lb (87.5 kg)     Risk Assessment/Calculations:      Objective:    Vital Signs:  BP (!) 136/102   Ht 5\' 9"  (1.753 m)   Wt 200 lb (90.7 kg)   BMI 29.53 kg/m    VITAL SIGNS:  reviewed  ASSESSMENT & PLAN:    1. Coronary artery disease: Secondary prevention stressed with the patient.  Importance of compliance with diet medication stressed any vocalized understanding.  I told him to walk on a regular basis and lifestyle modification was urged compliance with medications was also discussed at length 2. Essential hypertension: He is not sure that he is taken his medications this morning.  I told him to keep a diary of taking his medications and blood pressures and he promises to do so. 3. Diabetes mellitus and mixed dyslipidemia: Diet was emphasized at extensive length lifestyle modification was urged I told him to contact with his primary care physician about lab work.  His hemoglobin A1c is greater than 15.  He does not seem to be taking good care of himself. 4. I told him to see me in follow-up appointment in 4 to 6 weeks whenever his transportation is available.  He agrees to do so.         COVID-19 Education: The signs and symptoms of COVID-19 were discussed with the patient and how to seek care for testing (follow up with PCP or arrange E-visit).  The importance of social distancing was discussed today.  Time:   Today, I have spent 15 minutes with the patient with telehealth technology discussing the above problems.     Medication Adjustments/Labs and Tests Ordered: Current medicines are reviewed at length with the patient today.  Concerns regarding medicines are outlined above.   Tests Ordered: No orders of the defined types were placed in this encounter.   Medication Changes: No orders of the defined types were placed in this encounter.   Follow Up:  In Person in 4 month(s)  Signed, , MD  09/01/2020 4:35 PM    [facility]

## 2020-09-01 NOTE — Telephone Encounter (Signed)
Patient is currently scheduled for today, 09/01/20 at 3:40 PM with Dr. Tomie China. Patient's spouse is requesting to convert this appointment to a virtual due to a lack of transportation. Are we able to change this appointment to virtual or does the patient need to reschedule for another day? Please advise.

## 2020-09-01 NOTE — Telephone Encounter (Signed)
Appointment changes to MyChart video visit as per pt request.

## 2020-09-02 MED FILL — ATORVASTATIN CALCIUM 80 MG: 80 | 30 days supply | Qty: 30 | Fill #1

## 2020-09-02 MED FILL — METFORMIN HCL 500 MG TABS: 500 | 30 days supply | Qty: 120 | Fill #1

## 2020-09-02 MED FILL — TAMSULOSIN HCL 0.4 MG CAP: 0.4 | 30 days supply | Qty: 30 | Fill #1

## 2020-09-02 MED FILL — AMLODIPINE BESYLATE 10 MG T: 10 | 30 days supply | Qty: 30 | Fill #2

## 2020-09-02 MED FILL — FUROSEMIDE 40 MG TAB: 40 | 30 days supply | Qty: 30 | Fill #1

## 2020-10-03 ENCOUNTER — Telehealth (INDEPENDENT_AMBULATORY_CARE_PROVIDER_SITE_OTHER): Payer: Medicaid Other | Admitting: Cardiology

## 2020-10-03 ENCOUNTER — Encounter: Payer: Self-pay | Admitting: Cardiology

## 2020-10-03 ENCOUNTER — Telehealth: Payer: Self-pay

## 2020-10-03 VITALS — Ht 69.0 in | Wt 205.0 lb

## 2020-10-03 DIAGNOSIS — I10 Essential (primary) hypertension: Secondary | ICD-10-CM | POA: Diagnosis not present

## 2020-10-03 DIAGNOSIS — E119 Type 2 diabetes mellitus without complications: Secondary | ICD-10-CM | POA: Diagnosis not present

## 2020-10-03 DIAGNOSIS — E782 Mixed hyperlipidemia: Secondary | ICD-10-CM

## 2020-10-03 DIAGNOSIS — E088 Diabetes mellitus due to underlying condition with unspecified complications: Secondary | ICD-10-CM | POA: Diagnosis not present

## 2020-10-03 NOTE — Progress Notes (Signed)
Virtual Visit via Telephone Note   This visit type was conducted due to national recommendations for restrictions regarding the COVID-19 Pandemic (e.g. social distancing) in an effort to limit this patient's exposure and mitigate transmission in our community.  Due to his co-morbid illnesses, this patient is at least at moderate risk for complications without adequate follow up.  This format is felt to be most appropriate for this patient at this time.  The patient did not have access to video technology/had technical difficulties with video requiring transitioning to audio format only (telephone).  All issues noted in this document were discussed and addressed.  No physical exam could be performed with this format.  Please refer to the patient's chart for his  consent to telehealth for [facility].    Date:  10/03/2020   ID:  Kenneth Rios, DOB 1966-07-04, MRN [id number] The patient was identified using 2 identifiers.  Patient Location: Home Provider Location: Home Office   PCP:  Hoy Register, MD   [facility]  Cardiologist:  Garwin Brothers, MD  Advanced Practice Provider:  No care team member to display Electrophysiologist:  None  [id number]   Evaluation Performed:  Follow-Up Visit  Chief Complaint: Coronary artery disease  History of Present Illness:    Kenneth Rios is a 55 y.o. male with past medical history of coronary artery disease, essential hypertension, dyslipidemia and diabetes mellitus. He went to the appointment by phone. I could not get his video appointment started because of technical issues. He could not get on the video. He tells me that he does not have a car to drive and no way to get here. Therefore he wanted a telephone appointment. He leads a sedentary lifestyle. He denies any chest pain orthopnea or PND. At the time of my evaluation, the patient is alert awake oriented and in no distress.  The patient does not have  symptoms concerning for COVID-19 infection (fever, chills, cough, or new shortness of breath).    Past Medical History:  Diagnosis Date  . (HFpEF) heart failure with preserved ejection fraction (HCC) 03/30/2018  . Accelerated hypertension 06/05/2018  . Acute diverticulitis 08/29/2017  . Acute viral bronchitis 08/24/2017  . Angina pectoris (HCC) 03/29/2018  . Anxiety   . Anxiety and depression 02/06/2018  . Asthma with status asthmaticus   . Chest tightness 03/29/2018  . CHF (congestive heart failure) (HCC)    "fluttering"  . Depression   . Diabetes mellitus due to underlying condition with unspecified complications (HCC) 01/09/2020  . Diabetes mellitus without complication (HCC)   . Diverticulitis   . Diverticulitis large intestine 05/31/2018  . GERD (gastroesophageal reflux disease)   . Hyperlipidemia   . Hypertension   . Hypertensive urgency 08/24/2017  . Microcytic anemia 12/19/2018  . Mixed dyslipidemia 01/09/2020  . Palpitations 03/29/2018  . Peritonitis (HCC) 09/02/2017  . Pneumonia 12/16/2018  . Polysubstance abuse (HCC) 12/19/2018  . Prolonged Q-T interval on ECG 02/06/2018  . S/P colostomy (HCC) 09/16/2017  . Unstable angina (HCC) 03/29/2018   Past Surgical History:  Procedure Laterality Date  . CARDIAC CATHETERIZATION  03/30/2018  . COLON RESECTION N/A 09/05/2017   Procedure: HARTMAN'S COLECTOMY AND COLOSTOMY;  Surgeon: Sheliah Hatch De Blanch, MD;  Location: [facility] OR;  Service: General;  Laterality: N/A;  . COLON SURGERY    . COLOSTOMY TAKEDOWN N/A 05/31/2018   Procedure: LAPAROSCOPIC COLOSTOMY REVERSAL COLORECTAL ANASTOMOSIS ERAS PATHWAY;  Surgeon: [name], De Blanch, MD;  Location: [facility] OR;  Service: General;  Laterality: N/A;  . INTRAVASCULAR PRESSURE WIRE/FFR STUDY N/A 03/30/2018   Procedure: INTRAVASCULAR PRESSURE WIRE/FFR STUDY;  Surgeon: Yvonne Kendall, MD;  Location: [facility] INVASIVE CV LAB;  Service: Cardiovascular;  Laterality: N/A;  . LEFT HEART CATH AND CORONARY ANGIOGRAPHY N/A  03/30/2018   Procedure: LEFT HEART CATH AND CORONARY ANGIOGRAPHY;  Surgeon: Yvonne Kendall, MD;  Location: [facility] INVASIVE CV LAB;  Service: Cardiovascular;  Laterality: N/A;     Current Meds  Medication Sig  . amLODipine (NORVASC) 10 MG tablet Take 1 tablet (10 mg total) by mouth daily.  Marland Kitchen atorvastatin (LIPITOR) 80 MG tablet Take 1 tablet (80 mg total) by mouth daily.  . carvedilol (COREG) 6.25 MG tablet Take 1 tablet (6.25 mg total) by mouth 2 (two) times daily with a meal.  . cetirizine (ZYRTEC) 10 MG tablet TAKE 1 TABLET (10 MG TOTAL) BY MOUTH DAILY.  . fluticasone (FLONASE) 50 MCG/ACT nasal spray Place 2 sprays into both nostrils daily.  . furosemide (LASIX) 40 MG tablet Take 1 tablet (40 mg total) by mouth daily.  Marland Kitchen gabapentin (NEURONTIN) 300 MG capsule Take 2 capsules (600 mg total) by mouth 2 (two) times daily.  . insulin glargine (LANTUS SOLOSTAR) 100 UNIT/ML Solostar Pen Inject 50 Units into the skin daily.  Marland Kitchen lisinopril (ZESTRIL) 20 MG tablet Take 1 tablet (20 mg total) by mouth daily.  . metFORMIN (GLUCOPHAGE) 500 MG tablet Take 2 tablets (1,000 mg total) by mouth 2 (two) times daily with a meal.  . sildenafil (VIAGRA) 50 MG tablet Take 1 tablet (50 mg total) by mouth daily as needed for erectile dysfunction. At least 24 hrs between doses.  Don't take with nitroglycerin  . tamsulosin (FLOMAX) 0.4 MG CAPS capsule Take 1 capsule (0.4 mg total) by mouth daily.     Allergies:   Patient has no known allergies.   Social History   Tobacco Use  . Smoking status: Former Games developer  . Smokeless tobacco: Never Used  Vaping Use  . Vaping Use: Never used  Substance Use Topics  . Alcohol use: Yes    Alcohol/week: 6.0 standard drinks    Types: 6 Cans of beer per week    Comment: on the weekends  . Drug use: No     Family Hx: The patient's family history includes Diabetes in his maternal aunt and maternal uncle; Heart disease in his maternal aunt, mother, and sister. There is no history of  Sudden Cardiac Death.  ROS:   Please see the history of present illness.    I discussed my findings with the patient at length. All other systems reviewed and are negative.   Prior CV studies:   The following studies were reviewed today:  Coronary angiography report was again reviewed with the patient  Labs/Other Tests and Data Reviewed:    EKG:  EKG from previous visit was discussed  Recent Labs: 05/19/2020: ALT 13; BUN 11; Creatinine, Ser 1.14; Potassium 4.6; Sodium 136   Recent Lipid Panel Lab Results  Component Value Date/Time   CHOL 189 05/19/2020 10:15 AM   TRIG 95 05/19/2020 10:15 AM   HDL 53 05/19/2020 10:15 AM   CHOLHDL 3.6 05/19/2020 10:15 AM   CHOLHDL 6.1 03/31/2018 02:27 AM   LDLCALC 119 (H) 05/19/2020 10:15 AM    Wt Readings from Last 3 Encounters:  10/03/20 205 lb (93 kg)  09/01/20 200 lb (90.7 kg)  01/09/20 197 lb (89.4 kg)     Risk Assessment/Calculations:  Objective:    Vital Signs:  Ht 5\' 9"  (1.753 m)   Wt 205 lb (93 kg)   BMI 30.27 kg/m    VITAL SIGNS:  reviewed Patient did not get vital signs recorded  ASSESSMENT & PLAN:    1. Coronary artery disease: Secondary prevention stressed to the patient. Importance of compliance with diet medication stressed any vocalized understanding. He was advised to walk at least half an hour a day 5 times a week and he was agreeable. 2. Essential hypertension: He tells me that his blood pressure is much better now. I told him to make a follow-up appointment to come and see me in 2 months we could accurately evaluate it. If he can get his hands on a blood pressure machine he will adjust blood pressures and let know. 3. Mixed dyslipidemia: Diet was emphasized and lipids were reviewed and he had questions which were answered to his satisfaction 4. Diabetes mellitus: I discussed this and told him to be in touch with his primary care provider to be monitored carefully. The last time his hemoglobin A1c was  markedly elevated.        COVID-19 Education: The signs and symptoms of COVID-19 were discussed with the patient and how to seek care for testing (follow up with PCP or arrange E-visit).  The importance of social distancing was discussed today.  Time:   Today, I have spent 15 minutes with the patient with telehealth technology discussing the above problems.     Medication Adjustments/Labs and Tests Ordered: Current medicines are reviewed at length with the patient today.  Concerns regarding medicines are outlined above.   Tests Ordered: No orders of the defined types were placed in this encounter.   Medication Changes: No orders of the defined types were placed in this encounter.   Follow Up:  In Person 2 months  Signed, [place], MD  10/03/2020 3:35 PM    [facility]

## 2020-10-03 NOTE — Patient Instructions (Signed)

## 2020-10-03 NOTE — Telephone Encounter (Signed)
  Patient Consent for Virtual Visit         [name] has provided verbal consent on 10/03/2020 for a virtual visit (video or telephone).   CONSENT FOR VIRTUAL VISIT FOR:  Kenneth Rios  By participating in this virtual visit I agree to the following:  I hereby voluntarily request, consent and authorize [facility] and its employed or contracted physicians, physician assistants, nurse practitioners or other licensed health care professionals (the Practitioner), to provide me with telemedicine health care services (the "Services") as deemed necessary by the treating Practitioner. I acknowledge and consent to receive the Services by the Practitioner via telemedicine. I understand that the telemedicine visit will involve communicating with the Practitioner through live audiovisual communication technology and the disclosure of certain medical information by electronic transmission. I acknowledge that I have been given the opportunity to request an in-person assessment or other available alternative prior to the telemedicine visit and am voluntarily participating in the telemedicine visit.  I understand that I have the right to withhold or withdraw my consent to the use of telemedicine in the course of my care at any time, without affecting my right to future care or treatment, and that the Practitioner or I may terminate the telemedicine visit at any time. I understand that I have the right to inspect all information obtained and/or recorded in the course of the telemedicine visit and may receive copies of available information for a reasonable fee.  I understand that some of the potential risks of receiving the Services via telemedicine include:  Marland Kitchen Delay or interruption in medical evaluation due to technological equipment failure or disruption; . Information transmitted may not be sufficient (e.g. poor resolution of images) to allow for appropriate medical decision making by the  Practitioner; and/or  . In rare instances, security protocols could fail, causing a breach of personal health information.  Furthermore, I acknowledge that it is my responsibility to provide information about my medical history, conditions and care that is complete and accurate to the best of my ability. I acknowledge that Practitioner's advice, recommendations, and/or decision may be based on factors not within their control, such as incomplete or inaccurate data provided by me or distortions of diagnostic images or specimens that may result from electronic transmissions. I understand that the practice of medicine is not an exact science and that Practitioner makes no warranties or guarantees regarding treatment outcomes. I acknowledge that a copy of this consent can be made available to me via my patient portal [facility] MyChart), or I can request a printed copy by calling the office of [facility].    I understand that my insurance will be billed for this visit.   I have read or had this consent read to me. . I understand the contents of this consent, which adequately explains the benefits and risks of the Services being provided via telemedicine.  . I have been provided ample opportunity to ask questions regarding this consent and the Services and have had my questions answered to my satisfaction. . I give my informed consent for the services to be provided through the use of telemedicine in my medical care

## 2020-11-03 ENCOUNTER — Ambulatory Visit: Payer: Medicaid Other | Admitting: Family Medicine

## 2020-11-05 ENCOUNTER — Ambulatory Visit: Payer: Medicaid Other | Attending: Family Medicine | Admitting: Family Medicine

## 2020-11-05 ENCOUNTER — Other Ambulatory Visit: Payer: Self-pay | Admitting: Family Medicine

## 2020-11-05 ENCOUNTER — Other Ambulatory Visit: Payer: Self-pay

## 2020-11-05 DIAGNOSIS — I5042 Chronic combined systolic (congestive) and diastolic (congestive) heart failure: Secondary | ICD-10-CM | POA: Diagnosis not present

## 2020-11-05 DIAGNOSIS — R399 Unspecified symptoms and signs involving the genitourinary system: Secondary | ICD-10-CM | POA: Diagnosis not present

## 2020-11-05 DIAGNOSIS — J018 Other acute sinusitis: Secondary | ICD-10-CM

## 2020-11-05 DIAGNOSIS — E1165 Type 2 diabetes mellitus with hyperglycemia: Secondary | ICD-10-CM

## 2020-11-05 DIAGNOSIS — I10 Essential (primary) hypertension: Secondary | ICD-10-CM

## 2020-11-05 DIAGNOSIS — Z794 Long term (current) use of insulin: Secondary | ICD-10-CM | POA: Diagnosis not present

## 2020-11-05 DIAGNOSIS — M792 Neuralgia and neuritis, unspecified: Secondary | ICD-10-CM

## 2020-11-05 DIAGNOSIS — R040 Epistaxis: Secondary | ICD-10-CM

## 2020-11-05 MED ORDER — ATORVASTATIN CALCIUM 80 MG PO TABS: 80.0000 mg | ORAL_TABLET | Freq: Every day | ORAL | 6 refills | Status: DC

## 2020-11-05 MED ORDER — METFORMIN HCL 500 MG PO TABS: 1000.0000 mg | ORAL_TABLET | Freq: Two times a day (BID) | ORAL | 6 refills | Status: DC

## 2020-11-05 MED ORDER — TAMSULOSIN HCL 0.4 MG PO CAPS: 0.4000 mg | ORAL_CAPSULE | Freq: Every day | ORAL | 6 refills | Status: DC

## 2020-11-05 MED ORDER — LANTUS SOLOSTAR 100 UNIT/ML ~~LOC~~ SOPN: 50.0000 [IU] | PEN_INJECTOR | Freq: Every day | SUBCUTANEOUS | 6 refills | Status: DC

## 2020-11-05 MED ORDER — FUROSEMIDE 40 MG PO TABS: 40.0000 mg | ORAL_TABLET | Freq: Every day | ORAL | 6 refills | Status: DC

## 2020-11-05 MED ORDER — FLUTICASONE PROPIONATE 50 MCG/ACT NA SUSP: 2.0000 | Freq: Every day | NASAL | 1 refills | Status: DC

## 2020-11-05 MED ORDER — GABAPENTIN 300 MG PO CAPS: 600.0000 mg | ORAL_CAPSULE | Freq: Two times a day (BID) | ORAL | 6 refills | Status: DC

## 2020-11-05 MED ORDER — AMOXICILLIN 500 MG PO CAPS: 500.0000 mg | ORAL_CAPSULE | Freq: Three times a day (TID) | ORAL | 0 refills | Status: DC

## 2020-11-05 MED ORDER — CARVEDILOL 6.25 MG PO TABS: 6.2500 mg | ORAL_TABLET | Freq: Two times a day (BID) | ORAL | 6 refills | Status: DC

## 2020-11-05 MED ORDER — AMLODIPINE BESYLATE 10 MG PO TABS: 10.0000 mg | ORAL_TABLET | Freq: Every day | ORAL | 6 refills | Status: DC

## 2020-11-05 MED ORDER — LISINOPRIL 20 MG PO TABS: 20.0000 mg | ORAL_TABLET | Freq: Every day | ORAL | 6 refills | Status: DC
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
## 2020-11-08 ENCOUNTER — Other Ambulatory Visit: Payer: Self-pay

## 2020-11-08 MED FILL — Insulin Glargine Soln Pen-Injector 100 Unit/ML: SUBCUTANEOUS | 30 days supply | Qty: 15 | Fill #0 | Status: CN

## 2020-11-09 ENCOUNTER — Other Ambulatory Visit: Payer: Self-pay

## 2020-11-14 ENCOUNTER — Other Ambulatory Visit: Payer: Self-pay

## 2020-11-14 MED FILL — Furosemide Tab 40 MG: ORAL | 30 days supply | Qty: 30 | Fill #0 | Status: AC

## 2020-11-14 MED FILL — Amoxicillin (Trihydrate) Cap 500 MG: ORAL | 10 days supply | Qty: 30 | Fill #0 | Status: AC

## 2020-11-14 MED FILL — Tamsulosin HCl Cap 0.4 MG: ORAL | 30 days supply | Qty: 30 | Fill #0 | Status: AC

## 2020-11-14 MED FILL — Metformin HCl Tab 500 MG: ORAL | 30 days supply | Qty: 120 | Fill #0 | Status: AC

## 2020-11-14 MED FILL — Atorvastatin Calcium Tab 80 MG (Base Equivalent): ORAL | 30 days supply | Qty: 30 | Fill #0 | Status: AC

## 2020-11-17 ENCOUNTER — Other Ambulatory Visit: Payer: Self-pay

## 2020-12-09 ENCOUNTER — Ambulatory Visit: Payer: Medicaid Other | Admitting: Cardiology

## 2020-12-18 ENCOUNTER — Telehealth: Payer: Self-pay | Admitting: Family Medicine

## 2020-12-18 ENCOUNTER — Other Ambulatory Visit: Payer: Self-pay

## 2020-12-18 MED ORDER — FLUTICASONE PROPIONATE 50 MCG/ACT NA SUSP
2.0000 | Freq: Every day | NASAL | 0 refills | Status: DC
  Filled 2020-12-18: qty 16, 30d supply, fill #0

## 2020-12-18 MED ORDER — CETIRIZINE HCL 10 MG PO TABS
10.0000 mg | ORAL_TABLET | Freq: Every day | ORAL | 0 refills | Status: DC
  Filled 2020-12-18 – 2021-01-15 (×2): qty 30, 30d supply, fill #0

## 2020-12-18 NOTE — Telephone Encounter (Signed)
Pt was called and he states that he is needing pain medication for his abdominal pain. Pt also states that he would like an antibiotic for his sore throat and left ear pain.

## 2020-12-18 NOTE — Telephone Encounter (Signed)
Copied from CRM [id number]. Topic: General - Other >> Dec 15, 2020  9:00 AM Jaquita Rector A wrote: Reason for CRM: Tiffany patients fiance called in to inform Dr Alvis Lemmings or nurse to call patient say that there are some medical issues he need to discuss. Please call Ph#  [phone] >> Dec 17, 2020  2:58 PM Gaetana Michaelis A wrote: Patient's fiance has made an additional call about this request  Please contact to further advise when possible

## 2020-12-18 NOTE — Telephone Encounter (Signed)
I have sent a rx for Flonase and Zyrtec for his sinus symptoms as antibiotic is not indicated at this time. Regarding his abdominal pain additional history is needed to determine underlying causes and a visit is indicated with myself or anyone available in person. He can use Tylenol extra strength until then.

## 2020-12-19 NOTE — Telephone Encounter (Signed)
Pt was called and informed of medication being sent to pharmacy.  Pt states that he is having transportation problems and can not get to a in person appointment.

## 2020-12-19 NOTE — Telephone Encounter (Signed)
He is overdue for an in person. He needs to be examined. Last visit was virtual.

## 2020-12-19 NOTE — Telephone Encounter (Signed)
Does patient need to come in person or can he be virtual.

## 2020-12-25 ENCOUNTER — Other Ambulatory Visit: Payer: Self-pay

## 2021-01-15 ENCOUNTER — Other Ambulatory Visit: Payer: Self-pay | Admitting: Family Medicine

## 2021-01-15 ENCOUNTER — Other Ambulatory Visit: Payer: Self-pay

## 2021-01-15 MED ORDER — CARVEDILOL 6.25 MG PO TABS
6.2500 mg | ORAL_TABLET | Freq: Two times a day (BID) | ORAL | 0 refills | Status: DC
  Filled 2021-01-15: qty 60, 30d supply, fill #0

## 2021-01-15 MED ORDER — INSULIN GLARGINE 100 UNIT/ML SOLOSTAR PEN
50.0000 [IU] | PEN_INJECTOR | Freq: Every day | SUBCUTANEOUS | 0 refills | Status: DC
  Filled 2021-01-15: qty 15, 30d supply, fill #0

## 2021-01-15 MED ORDER — FUROSEMIDE 40 MG PO TABS
40.0000 mg | ORAL_TABLET | Freq: Every day | ORAL | 0 refills | Status: DC
  Filled 2021-01-15: qty 30, 30d supply, fill #0

## 2021-01-15 MED ORDER — TAMSULOSIN HCL 0.4 MG PO CAPS
0.4000 mg | ORAL_CAPSULE | Freq: Every day | ORAL | 0 refills | Status: DC
  Filled 2021-01-15: qty 30, 30d supply, fill #0

## 2021-01-15 MED ORDER — AMLODIPINE BESYLATE 10 MG PO TABS
10.0000 mg | ORAL_TABLET | Freq: Every day | ORAL | 0 refills | Status: DC
  Filled 2021-01-15: qty 30, 30d supply, fill #0

## 2021-01-15 MED ORDER — LISINOPRIL 20 MG PO TABS
20.0000 mg | ORAL_TABLET | Freq: Every day | ORAL | 0 refills | Status: DC
  Filled 2021-01-15: qty 30, 30d supply, fill #0

## 2021-01-15 MED ORDER — METFORMIN HCL 500 MG PO TABS
1000.0000 mg | ORAL_TABLET | Freq: Two times a day (BID) | ORAL | 0 refills | Status: DC
  Filled 2021-01-15: qty 120, 30d supply, fill #0

## 2021-01-15 MED ORDER — ATORVASTATIN CALCIUM 80 MG PO TABS
80.0000 mg | ORAL_TABLET | Freq: Every day | ORAL | 0 refills | Status: DC
  Filled 2021-01-15: qty 30, 30d supply, fill #0

## 2021-01-15 NOTE — Telephone Encounter (Signed)
Medication Refill - Medication: metFORMIN (GLUCOPHAGE) 500 MG tablet   insulin glargine (LANTUS) 100 UNIT/ML Solostar Pen    carvedilol (COREG) 6.25 MG tablet   furosemide (LASIX) 40 MG tablet   atorvastatin (LIPITOR) 80 MG tablet  lisinopril (ZESTRIL) 20 MG tablet    tamsulosin (FLOMAX) 0.4 MG CAPS capsule    amLODipine (NORVASC) 10 MG tablet  Pt is out of meds and appt is not until next month  Has the patient contacted their pharmacy? Yes.   (Agent: If no, request that the patient contact the pharmacy for the refill.) (Agent: If yes, when and what did the pharmacy advise?)RX discontinued/ call pcp  Preferred Pharmacy (with phone number or street name): [facility]   Agent: Please be advised that RX refills may take up to 3 business days. We ask that you follow-up with your pharmacy.

## 2021-01-15 NOTE — Telephone Encounter (Signed)
Notes to clinic medications filled by historical provider  Review for use and refills   Requested Prescriptions  Pending Prescriptions Disp Refills   metFORMIN (GLUCOPHAGE) 500 MG tablet      Sig: Take 2 tablets (1,000 mg total) by mouth 2 (two) times daily with a meal.      Endocrinology:  Diabetes - Biguanides Failed - 01/15/2021  9:14 AM      Failed - HBA1C is between 0 and 7.9 and within 180 days    Hemoglobin A1C  Date Value Ref Range Status  03/15/2014 7.1 (H) 4.2 - 6.3 % Final    Comment:    The American Diabetes Association recommends that a primary goal of therapy should be <7% and that physicians should reevaluate the treatment regimen in patients with HbA1c values consistently >8%.    HbA1c, POC (controlled diabetic range)  Date Value Ref Range Status  05/14/2019 12.2 (A) 0.0 - 7.0 % Final   Hgb A1c MFr Bld  Date Value Ref Range Status  05/19/2020 15.3 (H) 4.8 - 5.6 % Final    Comment:             Prediabetes: 5.7 - 6.4          Diabetes: >6.4          Glycemic control for adults with diabetes: <7.0           Passed - Cr in normal range and within 360 days    Creatinine  Date Value Ref Range Status  03/16/2014 1.00 0.60 - 1.30 mg/dL Final   Creatinine, Ser  Date Value Ref Range Status  05/19/2020 1.14 0.76 - 1.27 mg/dL Final          Passed - AA eGFR in normal range and within 360 days    EGFR (African American)  Date Value Ref Range Status  03/16/2014 >60  Final   GFR calc Af Amer  Date Value Ref Range Status  05/19/2020 84 >59 mL/min/1.73 Final    Comment:    **[facility] currently reports eGFR in compliance with the current**   recommendations of the [employer]. [facility] will   update reporting as new guidelines are published from the NKF-ASN   Task force.    EGFR (Non-African Amer.)  Date Value Ref Range Status  03/16/2014 >60  Final    Comment:    eGFR values <49mL/min/1.73 m2 may be an indication of chronic kidney  disease (CKD). Calculated eGFR is useful in patients with stable renal function. The eGFR calculation will not be reliable in acutely ill patients when serum creatinine is changing rapidly. It is not useful in  patients on dialysis. The eGFR calculation may not be applicable to patients at the low and high extremes of body sizes, pregnant women, and vegetarians.    GFR calc non Af Amer  Date Value Ref Range Status  05/19/2020 73 >59 mL/min/1.73 Final          Passed - Valid encounter within last 6 months    Recent Outpatient Visits           2 months ago Acute non-recurrent sinusitis of other sinus   [place], [place], MD   4 months ago Type 2 diabetes mellitus with hyperglycemia, with long-term current use of insulin [facility])   [place], Enobong, MD   8 months ago Anxiety and depression   [place],  Enobong, MD   1 year ago Type 2 diabetes mellitus with hyperglycemia, with long-term current use of insulin ([place])   [place], [place], MD   1 year ago Type 2 diabetes mellitus with hyperglycemia, with long-term current use of insulin ([place])   [place], Charlane Ferretti, MD       Future Appointments             In 1 month Charlott Rakes, MD [place]               insulin glargine (LANTUS) 100 UNIT/ML Solostar Pen 15 mL     Sig: Inject 50 Units into the skin daily.      Endocrinology:  Diabetes - Insulins Failed - 01/15/2021  9:14 AM      Failed - HBA1C is between 0 and 7.9 and within 180 days    Hemoglobin A1C  Date Value Ref Range Status  03/15/2014 7.1 (H) 4.2 - 6.3 % Final    Comment:    The American Diabetes Association recommends that a primary goal of therapy should be <7% and that physicians should reevaluate the treatment regimen in patients  with HbA1c values consistently >8%.    HbA1c, POC (controlled diabetic range)  Date Value Ref Range Status  05/14/2019 12.2 (A) 0.0 - 7.0 % Final   Hgb A1c MFr Bld  Date Value Ref Range Status  05/19/2020 15.3 (H) 4.8 - 5.6 % Final    Comment:             Prediabetes: 5.7 - 6.4          Diabetes: >6.4          Glycemic control for adults with diabetes: <7.0           Passed - Valid encounter within last 6 months    Recent Outpatient Visits           2 months ago Acute non-recurrent sinusitis of other sinus   [place], [place], MD   4 months ago Type 2 diabetes mellitus with hyperglycemia, with long-term current use of insulin ([place])   [place], [place], MD   8 months ago Anxiety and depression   [place], [place], MD   1 year ago Type 2 diabetes mellitus with hyperglycemia, with long-term current use of insulin ([place])   [place], Charlane Ferretti, MD   1 year ago Type 2 diabetes mellitus with hyperglycemia, with long-term current use of insulin ([place])   [place], Charlane Ferretti, MD       Future Appointments             In 1 month Charlott Rakes, MD [place]               carvedilol (COREG) 6.25 MG tablet      Sig: Take 1 tablet (6.25 mg total) by mouth 2 (two) times daily with a meal.      Cardiovascular:  Beta Blockers Failed - 01/15/2021  9:14 AM      Failed - Last BP in normal range    BP Readings from Last 1 Encounters:  09/01/20 (!) 136/102          Passed - Last Heart Rate in normal range  Pulse Readings from Last 1 Encounters:  01/09/20 92          Passed - Valid encounter within last 6 months    Recent Outpatient Visits           2 months ago Acute non-recurrent sinusitis of other sinus   [facility]  [place], [place], MD   4 months ago Type 2 diabetes mellitus with hyperglycemia, with long-term current use of insulin (HCC)   [facility] [place], Odette Horns, MD   8 months ago Anxiety and depression   [facility] [place], [place], MD   1 year ago Type 2 diabetes mellitus with hyperglycemia, with long-term current use of insulin (HCC)   [place] [facility] [place], Odette Horns, MD   1 year ago Type 2 diabetes mellitus with hyperglycemia, with long-term current use of insulin (HCC)   [place] [facility] [place], Odette Horns, MD       Future Appointments             In 1 month Hoy Register, MD [facility]               furosemide (LASIX) 40 MG tablet 30 tablet     Sig: Take 1 tablet (40 mg total) by mouth daily.      Cardiovascular:  Diuretics - Loop Failed - 01/15/2021  9:14 AM      Failed - Last BP in normal range    BP Readings from Last 1 Encounters:  09/01/20 (!) 136/102          Passed - K in normal range and within 360 days    Potassium  Date Value Ref Range Status  05/19/2020 4.6 3.5 - 5.2 mmol/L Final  03/16/2014 4.1 3.5 - 5.1 mmol/L Final          Passed - Ca in normal range and within 360 days    Calcium  Date Value Ref Range Status  05/19/2020 9.6 8.7 - 10.2 mg/dL Final   Calcium, Total  Date Value Ref Range Status  03/16/2014 8.5 8.5 - 10.1 mg/dL Final          Passed - Na in normal range and within 360 days    Sodium  Date Value Ref Range Status  05/19/2020 136 134 - 144 mmol/L Final  03/16/2014 133 (L) 136 - 145 mmol/L Final          Passed - Cr in normal range and within 360 days    Creatinine  Date Value Ref Range Status  03/16/2014 1.00 0.60 - 1.30 mg/dL Final   Creatinine, Ser  Date Value Ref Range Status  05/19/2020 1.14 0.76 - 1.27 mg/dL Final          Passed - Valid encounter within last 6 months    Recent  Outpatient Visits           2 months ago Acute non-recurrent sinusitis of other sinus   [facility] [place], [place], MD   4 months ago Type 2 diabetes mellitus with hyperglycemia, with long-term current use of insulin (HCC)   [facility] [place], Odette Horns, MD   8 months ago Anxiety and depression   [facility] [place], Odette Horns, MD   1 year ago Type 2 diabetes mellitus with hyperglycemia, with long-term current use of insulin (HCC)  [place], [place], MD   1 year ago Type 2 diabetes mellitus with hyperglycemia, with long-term current use of insulin [facility])   [place], Charlane Ferretti, MD       Future Appointments             In 1 month Charlott Rakes, MD [place]               atorvastatin (LIPITOR) 80 MG tablet      Sig: Take 1 tablet (80 mg total) by mouth daily.      Cardiovascular:  Antilipid - Statins Failed - 01/15/2021  9:14 AM      Failed - LDL in normal range and within 360 days    LDL Chol Calc (NIH)  Date Value Ref Range Status  05/19/2020 119 (H) 0 - 99 mg/dL Final          Passed - Total Cholesterol in normal range and within 360 days    Cholesterol, Total  Date Value Ref Range Status  05/19/2020 189 100 - 199 mg/dL Final          Passed - HDL in normal range and within 360 days    HDL  Date Value Ref Range Status  05/19/2020 53 >39 mg/dL Final          Passed - Triglycerides in normal range and within 360 days    Triglycerides  Date Value Ref Range Status  05/19/2020 95 0 - 149 mg/dL Final          Passed - Patient is not pregnant      Passed - Valid encounter within last 12 months    Recent Outpatient Visits           2 months ago Acute non-recurrent sinusitis of other sinus   [place], [place], MD   4 months  ago Type 2 diabetes mellitus with hyperglycemia, with long-term current use of insulin ([place])   [place], Charlane Ferretti, MD   8 months ago Anxiety and depression   [place], [place], MD   1 year ago Type 2 diabetes mellitus with hyperglycemia, with long-term current use of insulin ([place])   [place], [place], MD   1 year ago Type 2 diabetes mellitus with hyperglycemia, with long-term current use of insulin ([place])   [place], Charlane Ferretti, MD       Future Appointments             In 1 month Charlott Rakes, MD [place]               lisinopril (ZESTRIL) 20 MG tablet      Sig: Take 1 tablet (20 mg total) by mouth daily.      Cardiovascular:  ACE Inhibitors Failed - 01/15/2021  9:14 AM      Failed - Cr in normal range and within 180 days    Creatinine  Date Value Ref Range Status  03/16/2014 1.00 0.60 - 1.30 mg/dL Final   Creatinine, Ser  Date Value Ref Range Status  05/19/2020 1.14 0.76 - 1.27 mg/dL Final          Failed - K in normal range and within 180 days    Potassium  Date Value Ref Range Status  05/19/2020 4.6 3.5 - 5.2 mmol/L Final  03/16/2014 4.1 3.5 - 5.1 mmol/L Final          Failed - Last BP in normal range    BP Readings from Last 1 Encounters:  09/01/20 (!) 136/102          Passed - Patient is not pregnant      Passed - Valid encounter within last 6 months    Recent Outpatient Visits           2 months ago Acute non-recurrent sinusitis of other sinus   [place], [place], MD   4 months ago Type 2 diabetes mellitus with hyperglycemia, with long-term current use of insulin ([place])   [place], Charlane Ferretti, MD   8 months ago Anxiety and depression   [place],  [place], MD   1 year ago Type 2 diabetes mellitus with hyperglycemia, with long-term current use of insulin ([place])   [place], [place], MD   1 year ago Type 2 diabetes mellitus with hyperglycemia, with long-term current use of insulin ([place])   [place], Charlane Ferretti, MD       Future Appointments             In 1 month Charlott Rakes, MD [place]               tamsulosin (FLOMAX) 0.4 MG CAPS capsule 30 capsule     Sig: Take 1 capsule (0.4 mg total) by mouth daily.      Urology: Alpha-Adrenergic Blocker Failed - 01/15/2021  9:14 AM      Failed - Last BP in normal range    BP Readings from Last 1 Encounters:  09/01/20 (!) 136/102          Passed - Valid encounter within last 12 months    Recent Outpatient Visits           2 months ago Acute non-recurrent sinusitis of other sinus   [place], [place], MD   4 months ago Type 2 diabetes mellitus with hyperglycemia, with long-term current use of insulin ([place])   [place], Charlane Ferretti, MD   8 months ago Anxiety and depression   [place], [place], MD   1 year ago Type 2 diabetes mellitus with hyperglycemia, with long-term current use of insulin ([place])   [place], Charlane Ferretti, MD   1 year ago Type 2 diabetes mellitus with hyperglycemia, with long-term current use of insulin ([place])   [place], Charlane Ferretti, MD       Future Appointments             In 1 month Charlott Rakes, MD [place]               amLODipine (NORVASC) 10 MG tablet      Sig: Take 1 tablet (10 mg total) by mouth daily.      Cardiovascular:  Calcium Channel Blockers Failed - 01/15/2021  9:14 AM      Failed - Last BP in normal range     BP Readings from Last 1 Encounters:  09/01/20 (!) 136/102          Passed -  Valid encounter within last 6 months    Recent Outpatient Visits           2 months ago Acute non-recurrent sinusitis of other sinus   [place], [place], MD   4 months ago Type 2 diabetes mellitus with hyperglycemia, with long-term current use of insulin ([place])   [place], Enobong, MD   8 months ago Anxiety and depression   [place], Charlane Ferretti, MD   1 year ago Type 2 diabetes mellitus with hyperglycemia, with long-term current use of insulin ([place])   [place], Charlane Ferretti, MD   1 year ago Type 2 diabetes mellitus with hyperglycemia, with long-term current use of insulin ([place])   [place], MD       Future Appointments             In 1 month Charlott Rakes, MD [place]

## 2021-01-16 ENCOUNTER — Other Ambulatory Visit: Payer: Self-pay | Admitting: Family Medicine

## 2021-01-16 ENCOUNTER — Other Ambulatory Visit: Payer: Self-pay

## 2021-01-16 MED ORDER — GABAPENTIN 300 MG PO CAPS
600.0000 mg | ORAL_CAPSULE | Freq: Two times a day (BID) | ORAL | 3 refills | Status: DC
  Filled 2021-01-16: qty 120, 30d supply, fill #0
  Filled 2021-05-06: qty 120, 30d supply, fill #1
  Filled 2021-08-27 – 2021-12-08 (×2): qty 120, 30d supply, fill #0
  Filled 2021-12-08: qty 120, 30d supply, fill #1

## 2021-01-16 MED ORDER — GABAPENTIN 300 MG PO CAPS
600.0000 mg | ORAL_CAPSULE | Freq: Two times a day (BID) | ORAL | 3 refills | Status: DC
  Filled 2021-01-16: qty 60, 15d supply, fill #0

## 2021-01-19 ENCOUNTER — Other Ambulatory Visit: Payer: Self-pay

## 2021-03-02 ENCOUNTER — Other Ambulatory Visit: Payer: Self-pay

## 2021-03-03 ENCOUNTER — Ambulatory Visit: Payer: Medicaid Other | Admitting: Family Medicine

## 2021-04-20 ENCOUNTER — Other Ambulatory Visit: Payer: Self-pay | Admitting: Family Medicine

## 2021-04-21 ENCOUNTER — Other Ambulatory Visit: Payer: Self-pay

## 2021-04-21 ENCOUNTER — Other Ambulatory Visit: Payer: Self-pay | Admitting: Family Medicine

## 2021-04-21 NOTE — Telephone Encounter (Signed)
Requested medication (s) are due for refill today:   Yes Yes for all 7 requests  Requested medication (s) are on the active medication list:   Yes  Future visit scheduled:   Yes for 05/14/2021 with Dr. Margarita Rana.  He has not made his last 2 appts.  LOV was in 10/2020   Last ordered: All medications except Zyrtec had 30 day courtesy supplies given on 01/15/2021.   The Zyrtec was 12/18/2020 #30, 0 refills.  Returned for provider to review for refills since courtesy refills have been given prior to appt on 10/6.   Requested Prescriptions  Pending Prescriptions Disp Refills   cetirizine (ZYRTEC) 10 MG tablet 30 tablet 0    Sig: Take 1 tablet (10 mg total) by mouth daily.     Ear, Nose, and Throat:  Antihistamines Passed - 04/21/2021  9:14 AM      Passed - Valid encounter within last 12 months    Recent Outpatient Visits           5 months ago Acute non-recurrent sinusitis of other sinus   [place], Webb, MD   8 months ago Type 2 diabetes mellitus with hyperglycemia, with long-term current use of insulin ([place])   [place], [place], MD   11 months ago Anxiety and depression   [place], Charlane Ferretti, MD   1 year ago Type 2 diabetes mellitus with hyperglycemia, with long-term current use of insulin ([place])   [place], Charlane Ferretti, MD   1 year ago Type 2 diabetes mellitus with hyperglycemia, with long-term current use of insulin ([place])   [place], Charlane Ferretti, MD       Future Appointments             In 3 weeks Charlott Rakes, MD [place]             metFORMIN (GLUCOPHAGE) 500 MG tablet 120 tablet 0    Sig: Take 2 tablets (1,000 mg total) by mouth 2 (two) times daily with a meal.     Endocrinology:  Diabetes - Biguanides Failed - 04/21/2021  9:14 AM      Failed - HBA1C  is between 0 and 7.9 and within 180 days    Hemoglobin A1C  Date Value Ref Range Status  03/15/2014 7.1 (H) 4.2 - 6.3 % Final    Comment:    The American Diabetes Association recommends that a primary goal of therapy should be <7% and that physicians should reevaluate the treatment regimen in patients with HbA1c values consistently >8%.    HbA1c, POC (controlled diabetic range)  Date Value Ref Range Status  05/14/2019 12.2 (A) 0.0 - 7.0 % Final   Hgb A1c MFr Bld  Date Value Ref Range Status  05/19/2020 15.3 (H) 4.8 - 5.6 % Final    Comment:             Prediabetes: 5.7 - 6.4          Diabetes: >6.4          Glycemic control for adults with diabetes: <7.0           Failed - Valid encounter within last 6 months    Recent Outpatient Visits           5 months ago Acute non-recurrent sinusitis of other sinus   [place]  [place], Dendron, MD   8 months ago Type 2 diabetes mellitus with hyperglycemia, with long-term current use of insulin ([place])   [place], Charlane Ferretti, MD   11 months ago Anxiety and depression   [place], [place], MD   1 year ago Type 2 diabetes mellitus with hyperglycemia, with long-term current use of insulin ([place])   [place], [place], MD   1 year ago Type 2 diabetes mellitus with hyperglycemia, with long-term current use of insulin ([place])   [place], Charlane Ferretti, MD       Future Appointments             In 3 weeks Charlott Rakes, MD [place] in normal range and within 360 days    Creatinine  Date Value Ref Range Status  03/16/2014 1.00 0.60 - 1.30 mg/dL Final   Creatinine, Ser  Date Value Ref Range Status  05/19/2020 1.14 0.76 - 1.27 mg/dL Final          Passed - AA eGFR in normal range and within 360 days     EGFR (African American)  Date Value Ref Range Status  03/16/2014 >60  Final   GFR calc Af Amer  Date Value Ref Range Status  05/19/2020 84 >59 mL/min/1.73 Final    Comment:    **[facility] currently reports eGFR in compliance with the current**   recommendations of the [employer]. [facility] will   update reporting as new guidelines are published from the NKF-ASN   Task force.    EGFR (Non-African Amer.)  Date Value Ref Range Status  03/16/2014 >60  Final    Comment:    eGFR values <10m/min/1.73 m2 may be an indication of chronic kidney disease (CKD). Calculated eGFR is useful in patients with stable renal function. The eGFR calculation will not be reliable in acutely ill patients when serum creatinine is changing rapidly. It is not useful in  patients on dialysis. The eGFR calculation may not be applicable to patients at the low and high extremes of body sizes, pregnant women, and vegetarians.    GFR calc non Af Amer  Date Value Ref Range Status  05/19/2020 73 >59 mL/min/1.73 Final           furosemide (LASIX) 40 MG tablet 30 tablet 0    Sig: Take 1 tablet (40 mg total) by mouth daily.     Cardiovascular:  Diuretics - Loop Failed - 04/21/2021  9:14 AM      Failed - Last BP in normal range    BP Readings from Last 1 Encounters:  09/01/20 (!) 136/102          Failed - Valid encounter within last 6 months    Recent Outpatient Visits           5 months ago Acute non-recurrent sinusitis of other sinus   [place] MD   8 months ago Type 2 diabetes mellitus with hyperglycemia, with long-term current use of insulin ([place]   [place], MD   11 months ago Anxiety and depression   [place] ECharlane Ferretti MD   1 year ago Type 2 diabetes mellitus with hyperglycemia, with long-term  current use of insulin ([place])   [place], [place], MD   1 year ago Type 2 diabetes mellitus with hyperglycemia, with long-term current use of insulin ([place])   [place], Charlane Ferretti, MD       Future Appointments             In 3 weeks Charlott Rakes, MD [place] - K in normal range and within 360 days    Potassium  Date Value Ref Range Status  05/19/2020 4.6 3.5 - 5.2 mmol/L Final  03/16/2014 4.1 3.5 - 5.1 mmol/L Final          Passed - Ca in normal range and within 360 days    Calcium  Date Value Ref Range Status  05/19/2020 9.6 8.7 - 10.2 mg/dL Final   Calcium, Total  Date Value Ref Range Status  03/16/2014 8.5 8.5 - 10.1 mg/dL Final          Passed - Na in normal range and within 360 days    Sodium  Date Value Ref Range Status  05/19/2020 136 134 - 144 mmol/L Final  03/16/2014 133 (L) 136 - 145 mmol/L Final          Passed - Cr in normal range and within 360 days    Creatinine  Date Value Ref Range Status  03/16/2014 1.00 0.60 - 1.30 mg/dL Final   Creatinine, Ser  Date Value Ref Range Status  05/19/2020 1.14 0.76 - 1.27 mg/dL Final           atorvastatin (LIPITOR) 80 MG tablet 30 tablet 0    Sig: Take 1 tablet (80 mg total) by mouth daily.     Cardiovascular:  Antilipid - Statins Failed - 04/21/2021  9:14 AM      Failed - LDL in normal range and within 360 days    LDL Chol Calc (NIH)  Date Value Ref Range Status  05/19/2020 119 (H) 0 - 99 mg/dL Final          Passed - Total Cholesterol in normal range and within 360 days    Cholesterol, Total  Date Value Ref Range Status  05/19/2020 189 100 - 199 mg/dL Final          Passed - HDL in normal range and within 360 days    HDL  Date Value Ref Range Status  05/19/2020 53 >39 mg/dL Final          Passed - Triglycerides in normal range and within 360 days    Triglycerides  Date Value Ref Range Status   05/19/2020 95 0 - 149 mg/dL Final          Passed - Patient is not pregnant      Passed - Valid encounter within last 12 months    Recent Outpatient Visits           5 months ago Acute non-recurrent sinusitis of other sinus   [place], [place], MD   8 months ago Type 2 diabetes mellitus with hyperglycemia, with long-term current use of insulin ([place])   [place], Charlane Ferretti, MD   11 months ago Anxiety and depression   [place], Charlane Ferretti, MD   1 year ago Type 2 diabetes mellitus with hyperglycemia, with  long-term current use of insulin ([place])   [place], [place], MD   1 year ago Type 2 diabetes mellitus with hyperglycemia, with long-term current use of insulin ([place])   [place], Charlane Ferretti, MD       Future Appointments             In 3 weeks Charlott Rakes, MD [place]             lisinopril (ZESTRIL) 20 MG tablet 30 tablet 0    Sig: Take 1 tablet (20 mg total) by mouth daily.     Cardiovascular:  ACE Inhibitors Failed - 04/21/2021  9:14 AM      Failed - Cr in normal range and within 180 days    Creatinine  Date Value Ref Range Status  03/16/2014 1.00 0.60 - 1.30 mg/dL Final   Creatinine, Ser  Date Value Ref Range Status  05/19/2020 1.14 0.76 - 1.27 mg/dL Final          Failed - K in normal range and within 180 days    Potassium  Date Value Ref Range Status  05/19/2020 4.6 3.5 - 5.2 mmol/L Final  03/16/2014 4.1 3.5 - 5.1 mmol/L Final          Failed - Last BP in normal range    BP Readings from Last 1 Encounters:  09/01/20 (!) 136/102          Failed - Valid encounter within last 6 months    Recent Outpatient Visits           5 months ago Acute non-recurrent sinusitis of other sinus   [place], [place], MD   8 months ago Type 2 diabetes mellitus with hyperglycemia, with long-term current use of insulin ([place])   [place], Charlane Ferretti, MD   11 months ago Anxiety and depression   [place], University, MD   1 year ago Type 2 diabetes mellitus with hyperglycemia, with long-term current use of insulin ([place])   [place], [place], MD   1 year ago Type 2 diabetes mellitus with hyperglycemia, with long-term current use of insulin ([place])   [place], Charlane Ferretti, MD       Future Appointments             In 3 weeks Charlott Rakes, MD [place] - Patient is not pregnant       tamsulosin (FLOMAX) 0.4 MG CAPS capsule 30 capsule 0    Sig: Take 1 capsule (0.4 mg total) by mouth daily.     Urology: Alpha-Adrenergic Blocker Failed - 04/21/2021  9:14 AM      Failed - Last BP in normal range    BP Readings from Last 1 Encounters:  09/01/20 (!) 136/102          Passed - Valid encounter within last 12 months    Recent Outpatient Visits           5 months ago Acute non-recurrent sinusitis of other sinus   [place], Charlane Ferretti, MD   8 months ago Type 2 diabetes mellitus with hyperglycemia, with long-term current use of insulin ([place])   [place]  [place], [place], MD   11 months ago Anxiety and depression   [place], [place]ver, MD   1 year ago Type 2 diabetes mellitus with hyperglycemia, with long-term current use of insulin ([place])   [place], [place], MD   1 year ago Type 2 diabetes mellitus with hyperglycemia, with long-term current use of insulin ([place])   [place], Charlane Ferretti, MD       Future  Appointments             In 3 weeks Charlott Rakes, MD [place]             amLODipine (NORVASC) 10 MG tablet 30 tablet 0    Sig: Take 1 tablet (10 mg total) by mouth daily.     Cardiovascular:  Calcium Channel Blockers Failed - 04/21/2021  9:14 AM      Failed - Last BP in normal range    BP Readings from Last 1 Encounters:  09/01/20 (!) 136/102          Failed - Valid encounter within last 6 months    Recent Outpatient Visits           5 months ago Acute non-recurrent sinusitis of other sinus   [place], [place], MD   8 months ago Type 2 diabetes mellitus with hyperglycemia, with long-term current use of insulin ([place])   [place], Enobong, MD   11 months ago Anxiety and depression   [place], [place], MD   1 year ago Type 2 diabetes mellitus with hyperglycemia, with long-term current use of insulin ([place])   [place], Charlane Ferretti, MD   1 year ago Type 2 diabetes mellitus with hyperglycemia, with long-term current use of insulin ([place])   [place], Enobong, MD       Future Appointments             In 3 weeks Charlott Rakes, MD [place]

## 2021-04-22 ENCOUNTER — Other Ambulatory Visit: Payer: Self-pay

## 2021-04-22 MED ORDER — CARVEDILOL 6.25 MG PO TABS
6.2500 mg | ORAL_TABLET | Freq: Two times a day (BID) | ORAL | 0 refills | Status: DC
  Filled 2021-04-22: qty 60, 30d supply, fill #0

## 2021-04-23 ENCOUNTER — Other Ambulatory Visit: Payer: Self-pay

## 2021-04-23 MED ORDER — ATORVASTATIN CALCIUM 80 MG PO TABS
80.0000 mg | ORAL_TABLET | Freq: Every day | ORAL | 0 refills | Status: DC
  Filled 2021-04-23: qty 30, 30d supply, fill #0

## 2021-04-23 MED ORDER — CETIRIZINE HCL 10 MG PO TABS
10.0000 mg | ORAL_TABLET | Freq: Every day | ORAL | 0 refills | Status: DC
  Filled 2021-04-23: qty 30, 30d supply, fill #0

## 2021-04-23 MED ORDER — AMLODIPINE BESYLATE 10 MG PO TABS
10.0000 mg | ORAL_TABLET | Freq: Every day | ORAL | 0 refills | Status: DC
  Filled 2021-04-23: qty 30, 30d supply, fill #0

## 2021-04-23 MED ORDER — LISINOPRIL 20 MG PO TABS
20.0000 mg | ORAL_TABLET | Freq: Every day | ORAL | 0 refills | Status: DC
  Filled 2021-04-23: qty 30, 30d supply, fill #0

## 2021-04-23 MED ORDER — TAMSULOSIN HCL 0.4 MG PO CAPS
0.4000 mg | ORAL_CAPSULE | Freq: Every day | ORAL | 0 refills | Status: DC
  Filled 2021-04-23: qty 30, 30d supply, fill #0

## 2021-04-23 MED ORDER — METFORMIN HCL 500 MG PO TABS
1000.0000 mg | ORAL_TABLET | Freq: Two times a day (BID) | ORAL | 0 refills | Status: DC
  Filled 2021-04-23: qty 120, 30d supply, fill #0

## 2021-04-23 MED ORDER — FUROSEMIDE 40 MG PO TABS
40.0000 mg | ORAL_TABLET | Freq: Every day | ORAL | 0 refills | Status: DC
  Filled 2021-04-23: qty 30, 30d supply, fill #0

## 2021-05-07 ENCOUNTER — Other Ambulatory Visit: Payer: Self-pay

## 2021-05-14 ENCOUNTER — Other Ambulatory Visit: Payer: Self-pay

## 2021-05-14 ENCOUNTER — Ambulatory Visit: Payer: Medicaid Other | Attending: Family Medicine | Admitting: Family Medicine

## 2021-05-14 ENCOUNTER — Telehealth: Payer: Self-pay

## 2021-05-14 VITALS — BP 154/95 | HR 99 | Ht 69.0 in | Wt 179.2 lb

## 2021-05-14 DIAGNOSIS — Z7984 Long term (current) use of oral hypoglycemic drugs: Secondary | ICD-10-CM | POA: Insufficient documentation

## 2021-05-14 DIAGNOSIS — Z9049 Acquired absence of other specified parts of digestive tract: Secondary | ICD-10-CM | POA: Diagnosis not present

## 2021-05-14 DIAGNOSIS — E1159 Type 2 diabetes mellitus with other circulatory complications: Secondary | ICD-10-CM | POA: Insufficient documentation

## 2021-05-14 DIAGNOSIS — Z713 Dietary counseling and surveillance: Secondary | ICD-10-CM | POA: Diagnosis not present

## 2021-05-14 DIAGNOSIS — Z8249 Family history of ischemic heart disease and other diseases of the circulatory system: Secondary | ICD-10-CM | POA: Insufficient documentation

## 2021-05-14 DIAGNOSIS — I498 Other specified cardiac arrhythmias: Secondary | ICD-10-CM

## 2021-05-14 DIAGNOSIS — E1165 Type 2 diabetes mellitus with hyperglycemia: Secondary | ICD-10-CM

## 2021-05-14 DIAGNOSIS — I152 Hypertension secondary to endocrine disorders: Secondary | ICD-10-CM

## 2021-05-14 DIAGNOSIS — I5032 Chronic diastolic (congestive) heart failure: Secondary | ICD-10-CM | POA: Insufficient documentation

## 2021-05-14 DIAGNOSIS — Z7182 Exercise counseling: Secondary | ICD-10-CM | POA: Insufficient documentation

## 2021-05-14 DIAGNOSIS — G8929 Other chronic pain: Secondary | ICD-10-CM

## 2021-05-14 DIAGNOSIS — M25511 Pain in right shoulder: Secondary | ICD-10-CM | POA: Diagnosis not present

## 2021-05-14 DIAGNOSIS — M75101 Unspecified rotator cuff tear or rupture of right shoulder, not specified as traumatic: Secondary | ICD-10-CM | POA: Insufficient documentation

## 2021-05-14 DIAGNOSIS — Z833 Family history of diabetes mellitus: Secondary | ICD-10-CM | POA: Insufficient documentation

## 2021-05-14 DIAGNOSIS — I5042 Chronic combined systolic (congestive) and diastolic (congestive) heart failure: Secondary | ICD-10-CM

## 2021-05-14 DIAGNOSIS — Z794 Long term (current) use of insulin: Secondary | ICD-10-CM

## 2021-05-14 DIAGNOSIS — Z79899 Other long term (current) drug therapy: Secondary | ICD-10-CM | POA: Insufficient documentation

## 2021-05-14 DIAGNOSIS — Z91148 Patient's other noncompliance with medication regimen for other reason: Secondary | ICD-10-CM

## 2021-05-14 DIAGNOSIS — Z9114 Patient's other noncompliance with medication regimen: Secondary | ICD-10-CM

## 2021-05-14 DIAGNOSIS — I110 Hypertensive heart disease with heart failure: Secondary | ICD-10-CM

## 2021-05-14 LAB — POCT URINALYSIS DIP (CLINITEK)
Bilirubin, UA: NEGATIVE
Blood, UA: NEGATIVE
Glucose, UA: 500 mg/dL — AB
Ketones, POC UA: NEGATIVE mg/dL
Leukocytes, UA: NEGATIVE
Nitrite, UA: NEGATIVE
POC PROTEIN,UA: NEGATIVE
Spec Grav, UA: 1.01 (ref 1.010–1.025)
Urobilinogen, UA: 0.2 E.U./dL
pH, UA: 7 (ref 5.0–8.0)

## 2021-05-14 LAB — GLUCOSE, POCT (MANUAL RESULT ENTRY)
POC Glucose: 533 mg/dl — AB (ref 70–99)
POC Glucose: 482 mg/dl — AB (ref 70–99)

## 2021-05-14 LAB — POCT GLYCOSYLATED HEMOGLOBIN (HGB A1C): HbA1c, POC (controlled diabetic range): 13.4 % — AB (ref 0.0–7.0)

## 2021-05-14 MED ORDER — MELOXICAM 7.5 MG PO TABS
7.5000 mg | ORAL_TABLET | Freq: Every day | ORAL | 1 refills | Status: DC
  Filled 2021-05-14: qty 30, 30d supply, fill #0
  Filled 2021-06-11: qty 30, 30d supply, fill #1

## 2021-05-14 MED ORDER — LISINOPRIL 20 MG PO TABS
20.0000 mg | ORAL_TABLET | Freq: Every day | ORAL | 6 refills | Status: DC
Start: 2021-05-14 — End: 2022-04-26
  Filled 2021-05-14 – 2021-12-08 (×3): qty 30, 30d supply, fill #0
  Filled 2021-12-08: qty 30, 30d supply, fill #1

## 2021-05-14 MED ORDER — METFORMIN HCL 500 MG PO TABS
1000.0000 mg | ORAL_TABLET | Freq: Two times a day (BID) | ORAL | 6 refills | Status: DC
  Filled 2021-05-14 – 2021-08-27 (×2): qty 120, 30d supply, fill #0
  Filled 2021-12-08: qty 120, 30d supply, fill #1
  Filled 2021-12-08: qty 120, 30d supply, fill #0

## 2021-05-14 MED ORDER — ATORVASTATIN CALCIUM 80 MG PO TABS
80.0000 mg | ORAL_TABLET | Freq: Every day | ORAL | 6 refills | Status: DC
  Filled 2021-05-14 – 2021-08-27 (×2): qty 30, 30d supply, fill #0

## 2021-05-14 MED ORDER — FUROSEMIDE 40 MG PO TABS
40.0000 mg | ORAL_TABLET | Freq: Every day | ORAL | 6 refills | Status: DC
  Filled 2021-05-14 – 2021-12-08 (×3): qty 30, 30d supply, fill #0
  Filled 2021-12-08: qty 30, 30d supply, fill #1

## 2021-05-14 MED ORDER — CYCLOBENZAPRINE HCL 10 MG PO TABS
10.0000 mg | ORAL_TABLET | Freq: Two times a day (BID) | ORAL | 0 refills | Status: DC | PRN
  Filled 2021-05-14: qty 30, 15d supply, fill #0

## 2021-05-14 MED ORDER — CARVEDILOL 6.25 MG PO TABS
6.2500 mg | ORAL_TABLET | Freq: Two times a day (BID) | ORAL | 6 refills | Status: DC
  Filled 2021-05-14 – 2021-08-27 (×2): qty 60, 30d supply, fill #0
  Filled 2021-12-08: qty 60, 30d supply, fill #1
  Filled 2021-12-08: qty 60, 30d supply, fill #0

## 2021-05-14 MED ORDER — INSULIN GLARGINE 100 UNIT/ML SOLOSTAR PEN
50.0000 [IU] | PEN_INJECTOR | Freq: Every day | SUBCUTANEOUS | 6 refills | Status: DC
  Filled 2021-05-14: qty 30, 60d supply, fill #0

## 2021-05-14 MED ORDER — INSULIN ASPART 100 UNIT/ML IJ SOLN
30.0000 [IU] | Freq: Once | INTRAMUSCULAR | Status: AC
  Administered 2021-05-14: 30 [IU] via SUBCUTANEOUS

## 2021-05-14 NOTE — Progress Notes (Signed)
Subjective:  Patient ID: Kenneth Rios, male    DOB: May 11, 1966  Age: 55 y.o. MRN: [id number]  CC: Diabetes   HPI Kenneth Rios is a 55 y.o. 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  Interval History: His heart has been 'fluttering' on some occasions per patient. His R shoulder has been hurting; he had rotator cuff tear while in prison. Symptoms have been present x2 weeks.Pain prevents him from completely elevating his right arm and when he is able to elevate his right arm using his contralateral arm he has a painful arc on attempting to lower down.  Pain is described as severe.  When he is irritated he does not take his medications. His blood sugars severely elevated at 533 and he did not take his Lantus last night.  He has no reason for not taking his medications.  He has lost 25 pounds in the last 8 months. Has also been nonadherent with his antihypertensive and his statin.  Past Medical History:  Diagnosis Date   (HFpEF) heart failure with preserved ejection fraction (HCC) 03/30/2018   Accelerated hypertension 06/05/2018   Acute diverticulitis 08/29/2017   Acute viral bronchitis 08/24/2017   Angina pectoris (HCC) 03/29/2018   Anxiety    Anxiety and depression 02/06/2018   Asthma with status asthmaticus    Chest tightness 03/29/2018   CHF (congestive heart failure) (HCC)    "fluttering"   Depression    Diabetes mellitus due to underlying condition with unspecified complications (HCC) 01/09/2020   Diabetes mellitus without complication (HCC)    Diverticulitis    Diverticulitis large intestine 05/31/2018   GERD (gastroesophageal reflux disease)    Hyperlipidemia     Hypertension    Hypertensive urgency 08/24/2017   Microcytic anemia 12/19/2018   Mixed dyslipidemia 01/09/2020   Palpitations 03/29/2018   Peritonitis (HCC) 09/02/2017   Pneumonia 12/16/2018   Polysubstance abuse (HCC) 12/19/2018   Prolonged Q-T interval on ECG 02/06/2018   S/P colostomy (HCC) 09/16/2017   Unstable angina (HCC) 03/29/2018    Past Surgical History:  Procedure Laterality Date   CARDIAC CATHETERIZATION  03/30/2018   COLON RESECTION N/A 09/05/2017   Procedure: HARTMAN'S COLECTOMY AND COLOSTOMY;  Surgeon: Rodman Pickle, MD;  Location: [facility] OR;  Service: General;  Laterality: N/A;   COLON SURGERY     COLOSTOMY TAKEDOWN N/A 05/31/2018   Procedure: LAPAROSCOPIC COLOSTOMY REVERSAL COLORECTAL ANASTOMOSIS ERAS PATHWAY;  Surgeon: Sheliah Hatch De Blanch, MD;  Location: [facility] OR;  Service: General;  Laterality: N/A;   INTRAVASCULAR PRESSURE WIRE/FFR STUDY N/A 03/30/2018   Procedure: INTRAVASCULAR PRESSURE WIRE/FFR STUDY;  Surgeon: Yvonne Kendall, MD;  Location: [facility] INVASIVE CV LAB;  Service: Cardiovascular;  Laterality: N/A;   LEFT HEART CATH AND CORONARY ANGIOGRAPHY N/A 03/30/2018   Procedure: LEFT HEART CATH AND CORONARY ANGIOGRAPHY;  Surgeon: Yvonne Kendall, MD;  Location: [facility] INVAS[facility];  Service: Cardiovascular;  Laterality: N/A;    Family History  Problem Relation Age of Onset   Heart disease Mother    Heart disease Sister    Diabetes Maternal Aunt    Heart disease Maternal Aunt    Diabetes Maternal Uncle    Sudden Cardiac Death Neg Hx     No Known Allergies  Outpatient Medications Prior to Visit  Medication Sig Dispense Refill   amLODipine (NORVASC) 10 MG tablet Take 1 tablet (10 mg total) by mouth daily. 30 tablet 0   amoxicillin (AMOXIL) 500 MG capsule Take 500 mg by mouth 3 (three) times daily.     atorvastatin (LIPITOR) 80 MG tablet Take 1 tablet (80 mg total) by mouth daily. 30 tablet 0   carvedilol (COREG) 6.25 MG tablet Take 1 tablet (6.25 mg total) by mouth 2  (two) times daily with a meal. 60 tablet 0   cetirizine (ZYRTEC) 10 MG tablet Take 1 tablet (10 mg total) by mouth daily. 30 tablet 0   fluticasone (FLONASE) 50 MCG/ACT nasal spray Place 2 sprays into both nostrils daily. 16 g 0   furosemide (LASIX) 40 MG tablet Take 1 tablet (40 mg total) by mouth daily. 30 tablet 0   gabapentin (NEURONTIN) 300 MG capsule Take 2 capsules (600 mg total) by mouth 2 (two) times daily. 120 capsule 3   insulin glargine (LANTUS) 100 UNIT/ML Solostar Pen Inject 50 Units into the skin daily. 15 mL 0   lisinopril (ZESTRIL) 20 MG tablet Take 1 tablet (20 mg total) by mouth daily. 30 tablet 0   metFORMIN (GLUCOPHAGE) 500 MG tablet Take 2 tablets (1,000 mg total) by mouth 2 (two) times daily with a meal. 120 tablet 0   tamsulosin (FLOMAX) 0.4 MG CAPS capsule Take 1 capsule (0.4 mg total) by mouth daily. 30 capsule 0   sildenafil (VIAGRA) 50 MG tablet Take 1 tablet (50 mg total) by mouth daily as needed for erectile dysfunction. At least 24 hrs between doses.  Don't take with nitroglycerin (Patient not taking: Reported on 05/14/2021) 10 tablet 0   No facility-administered medications prior to visit.     ROS Review of Systems  Constitutional:  Negative for activity change and appetite change.  HENT:  Negative for sinus pressure and sore throat.   Eyes:  Negative for visual disturbance.  Respiratory:  Negative for cough, chest tightness and shortness of breath.   Cardiovascular:  Negative for chest pain and leg swelling.  Gastrointestinal:  Negative for abdominal distention, abdominal pain, constipation and diarrhea.  Endocrine: Negative.   Genitourinary:  Negative for dysuria.  Musculoskeletal:        See HPI  Skin:  Negative for rash.  Allergic/Immunologic: Negative.   Neurological:  Negative for weakness, light-headedness and numbness.  Psychiatric/Behavioral:  Negative for dysphoric mood and suicidal ideas.    Objective:  BP (!) 154/95   Pulse 99   Ht $R'5\' 9"'us$   (1.753 m)   Wt 179 lb 3.2 oz (81.3 kg)   SpO2 98%   BMI 26.46 kg/m   BP/Weight 05/14/2021 10/03/2020 03/11/2121  Systolic BP 482 - 500  Diastolic BP 95 - 370  Wt. (Lbs) 179.2 205 200  BMI 26.46 30.27 29.53      Physical Exam Constitutional:      Appearance: He is well-developed.  Cardiovascular:     Rate and Rhythm: Normal rate.     Heart sounds: Normal heart sounds. No murmur heard. Pulmonary:     Effort: Pulmonary effort is normal.     Breath sounds: Normal breath sounds. No wheezing or rales.  Chest:     Chest wall: No tenderness.  Abdominal:     General: Bowel sounds are normal. There is no distension.     Palpations: Abdomen is soft. There is no mass.     Tenderness: There is no abdominal tenderness.  Musculoskeletal:        General: Normal range of motion.     Right lower leg: No edema.     Left lower leg: No edema.     Comments: Tenderness and active forward elevation of right arm which is restricted to 120 degrees Intermittent jerking of right upper extremity due to pain Handgrip reduced to 4/5 in right, 5/5 in left  Neurological:     Mental Status: He is alert and oriented to person, place, and time.  Psychiatric:        Mood and Affect: Mood normal.    CMP Latest Ref Rng & Units 05/19/2020 11/26/2019 01/18/2019  Glucose 65 - 99 mg/dL 360(H) 568(HH) 103(H)  BUN 6 - 24 mg/dL $Remove'11 18 15  'OZODJOt$ Creatinine 0.76 - 1.27 mg/dL 1.14 1.30(H) 1.23  Sodium 134 - 144 mmol/L 136 134 138  Potassium 3.5 - 5.2 mmol/L 4.6 4.9 4.0  Chloride 96 - 106 mmol/L 99 92(L) 102  CO2 20 - 29 mmol/L $RemoveB'25 22 21  'XXJjOwzC$ Calcium 8.7 - 10.2 mg/dL 9.6 10.5(H) 8.8  Total Protein 6.0 - 8.5 g/dL 7.2 - 6.8  Total Bilirubin 0.0 - 1.2 mg/dL 0.4 - 0.4  Alkaline Phos 44 - 121 IU/L 81 - 97  AST 0 - 40 IU/L 10 - 16  ALT 0 - 44 IU/L 13 - 30    Lipid Panel     Component Value Date/Time   CHOL 189 05/19/2020 1015   TRIG 95 05/19/2020 1015   HDL 53 05/19/2020 1015   CHOLHDL 3.6 05/19/2020 1015   CHOLHDL 6.1  03/31/2018 0227   VLDL UNABLE TO CALCULATE IF TRIGLYCERIDE OVER 400 mg/dL 03/31/2018 0227   LDLCALC 119 (H) 05/19/2020 1015    CBC    Component Value Date/Time   WBC 5.8 06/05/2018 0820   RBC 3.35 (L) 06/05/2018 0820   HGB 9.2 (L) 06/05/2018 0820   HGB 14.9 03/29/2018 1120   HCT 27.2 (L) 06/05/2018 0820   HCT 42.2 03/29/2018 1120   PLT 331 06/05/2018 0820   PLT 300 03/29/2018 1120   MCV 81.2 06/05/2018 0820   MCV 84 03/29/2018 1120   MCV 82 03/14/2014 0400   MCH 27.5 06/05/2018 0820   MCHC 33.8 06/05/2018 0820   RDW 12.5 06/05/2018 0820   RDW 13.7 03/29/2018 1120   RDW 13.5 03/14/2014 0400   LYMPHSABS 2.4 06/05/2018 0820   LYMPHSABS 0.9 (L) 03/14/2014 0400   MONOABS 0.4 06/05/2018 0820   MONOABS 0.1 (L) 03/14/2014 0400   EOSABS 0.4 06/05/2018 0820   EOSABS 0.0 03/14/2014 0400   BASOSABS 0.0 06/05/2018 0820   BASOSABS 0.0 03/14/2014 0400    Lab Results  Component Value Date   HGBA1C 13.4 (A) 05/14/2021    Assessment & Plan:  1. Type 2 diabetes mellitus with hyperglycemia, with long-term current use of insulin (HCC) Uncontrolled due to medication nonadherence Severely elevated glucose of 533 with high risk of progression to DKA. Novolog 30 units administered STAT along with oral hydration Urinalysis sent off - negative for ketones Patient observed for 30 mins and CBG repeated returned at [place] on Diabetic diet, my plate method, 793 minutes of moderate intensity exercise/week Blood sugar logs with fasting goals of 80-120 mg/dl, random of less than 180 and in the event of sugars less than 60 mg/dl or greater than 400 mg/dl encouraged to notify the clinic. Advised on the need for annual eye exams, annual foot exams, Pneumonia vaccine. - POCT glucose (manual entry) -  POCT glycosylated hemoglobin (Hb A1C) - CMP14+EGFR - Lipid panel - Microalbumin / creatinine urine ratio - atorvastatin (LIPITOR) 80 MG tablet; Take 1 tablet (80 mg total) by mouth daily.   Dispense: 30 tablet; Refill: 6 - insulin glargine (LANTUS) 100 UNIT/ML Solostar Pen; Inject 50 Units into the skin daily.  Dispense: 30 mL; Refill: 6 - metFORMIN (GLUCOPHAGE) 500 MG tablet; Take 2 tablets (1,000 mg total) by mouth 2 (two) times daily with a meal.  Dispense: 120 tablet; Refill: 6 - POCT glucose (manual entry) - POCT URINALYSIS DIP (CLINITEK) - insulin aspart (novoLOG) injection 30 Units  2. Non compliance w medication regimen Discussed implications of medication non adherence including complications of Diabetes, Hypertension and his additional medical conditions Underlying Behavioral Health issues likely contributing and he is opposed to Pharmacotherapy or counseling Nonchalant during discussion   3. Fluttering heart EKG revealed enlarged L atrium, unchanged from previous  4. Chronic right shoulder pain Uncontrolled Previous rotator cuff tear Will hold off on steroid given hyperglycemia Consider PT if NSAID and muscle relaxant are ineffective - cyclobenzaprine (FLEXERIL) 10 MG tablet; Take 1 tablet (10 mg total) by mouth 2 (two) times daily as needed for muscle spasms.  Dispense: 30 tablet; Refill: 0 - meloxicam (MOBIC) 7.5 MG tablet; Take 1 tablet (7.5 mg total) by mouth daily.  Dispense: 30 tablet; Refill: 1  5. Hypertensive heart disease with chronic combined systolic and diastolic congestive heart failure (HCC) EF of 45- 50% from echo of 08/2017 Euvolemic High risk patient with risk of progression due to medication non adherence and nonchalant behavior - furosemide (LASIX) 40 MG tablet; Take 1 tablet (40 mg total) by mouth daily.  Dispense: 30 tablet; Refill: 6  6. Hypertension associated with diabetes ([place]) Uncontrolled due to Non compliance Compliance has been emphasized Counseled on blood pressure goal of less than 130/80, low-sodium, DASH diet, medication compliance, 150 minutes of moderate intensity exercise per week. Discussed medication compliance, adverse  effects. - carvedilol (COREG) 6.25 MG tablet; Take 1 tablet (6.25 mg total) by mouth 2 (two) times daily with a meal.  Dispense: 60 tablet; Refill: 6 - lisinopril (ZESTRIL) 20 MG tablet; Take 1 tablet (20 mg total) by mouth daily.  Dispense: 30 tablet; Refill: 6   No orders of the defined types were placed in this encounter.  50 minutes of total face to face time spent including median intraservice time reviewing previous histories and test results, ordering medications and investigations, documenting in the chart, counseling patient on his Diabetes Mellitus with severe hyperglycemia at risk of progressing to DKA. He would normally be referred to the ED for insulin and IV hydration however we have managed this in the clinic. Discussed investigations, treatment plan.  All questions were answered to the patient's satisfaction   Return in about 3 months (around 08/14/2021) for Medical conditions.       Charlott Rakes, MD, FAAFP. [facility] and [facility] [place], [place]   05/14/2021, 11:56 AM

## 2021-05-14 NOTE — Telephone Encounter (Signed)
Met with the patient when he was in the clinic today. He had concerns about transportation. He was not aware that he has [facility] and they would provide transportation to medical appointments. Provided him with his policy information as well as the phone number for [employer], # [phone].  Also provided him with the phone number for [facility] and explained that he can use that transportation for [facility] appointments.   He currently receives disability and is able to pay for housing and utilities. However, he said that he struggles paying for food and he said he  is not eligible for food stamps due to past history.  He was in agreement to making a referral to [employer] for assistance with food delivery.

## 2021-05-15 ENCOUNTER — Other Ambulatory Visit: Payer: Self-pay

## 2021-05-15 ENCOUNTER — Telehealth (HOSPITAL_BASED_OUTPATIENT_CLINIC_OR_DEPARTMENT_OTHER): Payer: Medicaid Other | Admitting: Family Medicine

## 2021-05-15 ENCOUNTER — Telehealth: Payer: Self-pay

## 2021-05-15 ENCOUNTER — Encounter: Payer: Self-pay | Admitting: Family Medicine

## 2021-05-15 LAB — CMP14+EGFR
ALT: 15 IU/L (ref 0–44)
AST: 10 IU/L (ref 0–40)
Albumin/Globulin Ratio: 1.7 (ref 1.2–2.2)
Albumin: 4.4 g/dL (ref 3.8–4.9)
Alkaline Phosphatase: 92 IU/L (ref 44–121)
BUN/Creatinine Ratio: 11 (ref 9–20)
BUN: 12 mg/dL (ref 6–24)
Bilirubin Total: 0.3 mg/dL (ref 0.0–1.2)
CO2: 24 mmol/L (ref 20–29)
Calcium: 9.8 mg/dL (ref 8.7–10.2)
Chloride: 94 mmol/L — ABNORMAL LOW (ref 96–106)
Creatinine, Ser: 1.05 mg/dL (ref 0.76–1.27)
Globulin, Total: 2.6 g/dL (ref 1.5–4.5)
Glucose: 537 mg/dL (ref 70–99)
Potassium: 5 mmol/L (ref 3.5–5.2)
Sodium: 133 mmol/L — ABNORMAL LOW (ref 134–144)
Total Protein: 7 g/dL (ref 6.0–8.5)
eGFR: 84 mL/min/{1.73_m2} (ref >59)

## 2021-05-15 LAB — MICROALBUMIN / CREATININE URINE RATIO
Creatinine, Urine: 23.9 mg/dL
Microalb/Creat Ratio: 21 mg/g creat (ref 0–29)
Microalbumin, Urine: 5 ug/mL

## 2021-05-15 LAB — LIPID PANEL
Chol/HDL Ratio: 4.1 ratio (ref 0.0–5.0)
Cholesterol, Total: 223 mg/dL — ABNORMAL HIGH (ref 100–199)
HDL: 55 mg/dL (ref >39)
LDL Chol Calc (NIH): 121 mg/dL — ABNORMAL HIGH (ref 0–99)
Triglycerides: 267 mg/dL — ABNORMAL HIGH (ref 0–149)
VLDL Cholesterol Cal: 47 mg/dL — ABNORMAL HIGH (ref 5–40)

## 2021-05-15 MED ORDER — BD PEN NEEDLE NANO U/F 32G X 4 MM MISC
11 refills | Status: DC
  Filled 2021-05-15 – 2021-12-08 (×2): qty 100, 34d supply, fill #0

## 2021-05-15 NOTE — Telephone Encounter (Signed)
Error

## 2021-05-15 NOTE — Telephone Encounter (Signed)
-----   Message from Hoy Register, MD sent at 05/15/2021 10:46 AM EDT ----- Please inform him that his cholesterol is elevated likely due to nonadherence with his cholesterol medication.  Diabetes is also significantly uncontrolled as we discussed at his visit yesterday please encouraged to be more compliant with his medications, diabetic diet and low-cholesterol diet.

## 2021-05-15 NOTE — Telephone Encounter (Signed)
Patient name and DOB has been verified Patient was informed of lab results. Patient had no questions.  

## 2021-06-08 NOTE — Telephone Encounter (Signed)
Pt requested a call back from Erskine Squibb, stated he still has some concerns he wanted to discuss directly with Erskine Squibb, please advise.

## 2021-06-08 NOTE — Telephone Encounter (Signed)
Call received from patient and his friend, Elmarie Shiley.  She explained that he is not able to pay all of his rent this month.  He is about $200 short.  The total rent is $700/month. Tiffany has contacted DSS and they do not have any funding available to assist.  She plans to call [facility] tomorrow to inquire about assistance.  She said that she thinks he was not able to pay his rent because his light bill was extremely high this month. She is assisting him with paying the light bill be he still needs help with the rent.   Tiffany inquired if [facility] can assist with rent.  Per their website, rent assistance up to $200 is a benefit. The patient still does not have an insurance card.  He has the policy number that this CM provided to him at his last appointment at [facility]. Provided Tiffany with the customer service number for [employer] to call regarding the rent assistance.

## 2021-06-09 NOTE — Telephone Encounter (Signed)
Pt called and is requesting to speak with Erskine Squibb today if possible. Please advise.

## 2021-06-09 NOTE — Telephone Encounter (Signed)
Call placed to patient and spoke to both patient and friend, Tiffany.  He said that his landlord told him today that he will proceed with eviction papers if the rent due is not received by Monday, 06/15/2021.  The is extremely anxious and is in agreement to sending a referral to [employer] [place] for assistance.   Tiffany said that she contacted [employer] and they put in a referral requesting assistance with patient's rent, but there is possibly a 3 day wait for a response.  Urgent Referral sent to Donavan Burnet, attorney/LANC requesting assistance with patients possible eviction.

## 2021-06-11 ENCOUNTER — Other Ambulatory Visit: Payer: Self-pay

## 2021-06-11 ENCOUNTER — Other Ambulatory Visit: Payer: Self-pay | Admitting: Family Medicine

## 2021-06-11 ENCOUNTER — Telehealth: Payer: Self-pay | Admitting: Family Medicine

## 2021-06-11 DIAGNOSIS — G8929 Other chronic pain: Secondary | ICD-10-CM

## 2021-06-11 NOTE — Telephone Encounter (Signed)
Call returned to patient he confirmed that Kenneth Rios has been in touch with him. His friend,Kenneth Rios, said that the case is being assigned to attorney and if they are not able to assist, they should be able to provide him with resources to assist.  She also said that she spoke to the [facility] caseworker and she is sending the request for rent assistance to her supervisor and the caseworker will email her with a decision.    Message received from [facility], Attorney/LANC- they have contacted the patient and are in the process of assigning his case to an attorney.

## 2021-06-11 NOTE — Telephone Encounter (Signed)
Requested medication (s) are due for refill today:   Provider to review  Requested medication (s) are on the active medication list:   Yes  Future visit scheduled:   Yes   Last ordered: 05/14/2021 #30, 0 refills  Non delegated refill   Requested Prescriptions  Pending Prescriptions Disp Refills   cyclobenzaprine (FLEXERIL) 10 MG tablet 30 tablet 0    Sig: Take 1 tablet (10 mg total) by mouth 2 (two) times daily as needed for muscle spasms.     Not Delegated - Analgesics:  Muscle Relaxants Failed - 06/11/2021  9:19 AM      Failed - This refill cannot be delegated      Passed - Valid encounter within last 6 months    Recent Outpatient Visits           4 weeks ago Type 2 diabetes mellitus with hyperglycemia, with long-term current use of insulin (HCC)   [facility] [employer], [place], MD   7 months ago Acute non-recurrent sinusitis of other sinus   [facility] [place], [place], MD   9 months ago Type 2 diabetes mellitus with hyperglycemia, with long-term current use of insulin (HCC)   [facility] Hoy Register, MD   1 year ago Anxiety and depression   [facility] [place], Odette Horns, MD   1 year ago Type 2 diabetes mellitus with hyperglycemia, with long-term current use of insulin [facility])   [facility] Hoy Register, MD       Future Appointments             In 2 months Hoy Register, MD [facility]

## 2021-06-11 NOTE — Telephone Encounter (Signed)
Pt would like for you to give him a call.  He said you handled some business for him and he wants to know how it turned out.

## 2021-06-12 ENCOUNTER — Other Ambulatory Visit: Payer: Self-pay

## 2021-06-12 ENCOUNTER — Telehealth (INDEPENDENT_AMBULATORY_CARE_PROVIDER_SITE_OTHER): Payer: Self-pay

## 2021-06-12 MED ORDER — CYCLOBENZAPRINE HCL 10 MG PO TABS
10.0000 mg | ORAL_TABLET | Freq: Two times a day (BID) | ORAL | 0 refills | Status: DC | PRN
  Filled 2021-06-12: qty 30, 15d supply, fill #0

## 2021-06-12 NOTE — Telephone Encounter (Signed)
Copied from CRM [id number]. Topic: General - Other >> Jun 09, 2021 10:43 AM Gaetana Michaelis A wrote: Reason for CRM: The patient would like to be contacted by Durene Cal when possible   The patient shares that the staff member will know what the call is about  Please contact when possible

## 2021-06-15 NOTE — Telephone Encounter (Signed)
This message is dated 06/09/2021. I have spoken to the patient since then.  Last conversation 06/11/2021

## 2021-07-17 ENCOUNTER — Telehealth: Payer: Self-pay | Admitting: Family Medicine

## 2021-07-17 NOTE — Telephone Encounter (Signed)
PT IS REQUESTING TO SPEAK WITH JANE

## 2021-07-20 NOTE — Telephone Encounter (Signed)
Call returned to patient.  He explained that he needs food delivered. He has no transportation to the grocery store or a food pantry. Explained to him that this CM will send another referral to [employer] requesting grocery delivery.  Referral then sent to attention of Joanette Gula.  The patient inquired if this CM could drive him to get food and the answer was no, this CM is not permitted to do that.  Explained to him that this CM will try to identify other resources that may help him.

## 2021-08-26 ENCOUNTER — Ambulatory Visit: Payer: Medicaid Other | Attending: Family Medicine | Admitting: Family Medicine

## 2021-08-26 ENCOUNTER — Other Ambulatory Visit: Payer: Self-pay

## 2021-08-26 ENCOUNTER — Encounter: Payer: Self-pay | Admitting: Family Medicine

## 2021-08-26 VITALS — BP 156/101 | HR 95 | Ht 69.0 in | Wt 175.6 lb

## 2021-08-26 DIAGNOSIS — Z8249 Family history of ischemic heart disease and other diseases of the circulatory system: Secondary | ICD-10-CM | POA: Diagnosis not present

## 2021-08-26 DIAGNOSIS — Z791 Long term (current) use of non-steroidal anti-inflammatories (NSAID): Secondary | ICD-10-CM | POA: Insufficient documentation

## 2021-08-26 DIAGNOSIS — E1165 Type 2 diabetes mellitus with hyperglycemia: Secondary | ICD-10-CM | POA: Insufficient documentation

## 2021-08-26 DIAGNOSIS — Z79899 Other long term (current) drug therapy: Secondary | ICD-10-CM | POA: Insufficient documentation

## 2021-08-26 DIAGNOSIS — Z6825 Body mass index (BMI) 25.0-25.9, adult: Secondary | ICD-10-CM | POA: Insufficient documentation

## 2021-08-26 DIAGNOSIS — I5042 Chronic combined systolic (congestive) and diastolic (congestive) heart failure: Secondary | ICD-10-CM | POA: Diagnosis not present

## 2021-08-26 DIAGNOSIS — Z9049 Acquired absence of other specified parts of digestive tract: Secondary | ICD-10-CM | POA: Insufficient documentation

## 2021-08-26 DIAGNOSIS — I498 Other specified cardiac arrhythmias: Secondary | ICD-10-CM | POA: Insufficient documentation

## 2021-08-26 DIAGNOSIS — H538 Other visual disturbances: Secondary | ICD-10-CM | POA: Insufficient documentation

## 2021-08-26 DIAGNOSIS — M7502 Adhesive capsulitis of left shoulder: Secondary | ICD-10-CM | POA: Diagnosis not present

## 2021-08-26 DIAGNOSIS — Z7182 Exercise counseling: Secondary | ICD-10-CM | POA: Diagnosis not present

## 2021-08-26 DIAGNOSIS — Z9114 Patient's other noncompliance with medication regimen: Secondary | ICD-10-CM | POA: Insufficient documentation

## 2021-08-26 DIAGNOSIS — N528 Other male erectile dysfunction: Secondary | ICD-10-CM | POA: Insufficient documentation

## 2021-08-26 DIAGNOSIS — I152 Hypertension secondary to endocrine disorders: Secondary | ICD-10-CM

## 2021-08-26 DIAGNOSIS — E1159 Type 2 diabetes mellitus with other circulatory complications: Secondary | ICD-10-CM | POA: Insufficient documentation

## 2021-08-26 DIAGNOSIS — K5780 Diverticulitis of intestine, part unspecified, with perforation and abscess without bleeding: Secondary | ICD-10-CM | POA: Diagnosis not present

## 2021-08-26 DIAGNOSIS — I110 Hypertensive heart disease with heart failure: Secondary | ICD-10-CM | POA: Diagnosis not present

## 2021-08-26 DIAGNOSIS — Z833 Family history of diabetes mellitus: Secondary | ICD-10-CM | POA: Diagnosis not present

## 2021-08-26 DIAGNOSIS — G8929 Other chronic pain: Secondary | ICD-10-CM

## 2021-08-26 DIAGNOSIS — K651 Peritoneal abscess: Secondary | ICD-10-CM | POA: Diagnosis not present

## 2021-08-26 DIAGNOSIS — Z713 Dietary counseling and surveillance: Secondary | ICD-10-CM | POA: Insufficient documentation

## 2021-08-26 DIAGNOSIS — Z794 Long term (current) use of insulin: Secondary | ICD-10-CM | POA: Diagnosis not present

## 2021-08-26 LAB — GLUCOSE, POCT (MANUAL RESULT ENTRY)
POC Glucose: 412 mg/dl — AB (ref 70–99)
POC Glucose: 341 mg/dl — AB (ref 70–99)

## 2021-08-26 LAB — POCT GLYCOSYLATED HEMOGLOBIN (HGB A1C): HbA1c, POC (controlled diabetic range): 14 % — AB (ref 0.0–7.0)

## 2021-08-26 MED ORDER — TAMSULOSIN HCL 0.4 MG PO CAPS
0.4000 mg | ORAL_CAPSULE | Freq: Every day | ORAL | 3 refills | Status: DC
  Filled 2021-08-26: qty 30, 30d supply, fill #0
  Filled 2021-12-08: qty 30, 30d supply, fill #1
  Filled 2021-12-08: qty 30, 30d supply, fill #0

## 2021-08-26 MED ORDER — SILDENAFIL CITRATE 100 MG PO TABS
100.0000 mg | ORAL_TABLET | Freq: Every day | ORAL | 1 refills | Status: DC | PRN
  Filled 2021-08-26: qty 10, 10d supply, fill #0

## 2021-08-26 MED ORDER — CYCLOBENZAPRINE HCL 10 MG PO TABS
10.0000 mg | ORAL_TABLET | Freq: Two times a day (BID) | ORAL | 1 refills | Status: DC | PRN
  Filled 2021-08-26 – 2021-12-08 (×2): qty 60, 30d supply, fill #0
  Filled 2021-12-08: qty 60, 30d supply, fill #1

## 2021-08-26 MED ORDER — INSULIN ASPART 100 UNIT/ML IJ SOLN
15.0000 [IU] | Freq: Once | INTRAMUSCULAR | Status: AC
  Administered 2021-08-26: 15 [IU] via SUBCUTANEOUS

## 2021-08-26 NOTE — Progress Notes (Signed)
Subjective:  Patient ID: Kenneth Rios, male    DOB: [birth year]  Age: 56 y.o. MRN: [id number]  CC: Diabetes   HPI Kenneth Rios is a 56 y.o. 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  Interval History: He has been lightheaded and dyspneic, complains of occasional palpitations but no chest pain.  He has not been to see cardiology in a while. Endorses not taking his medications as he did not have them.  He has no pedal edema and has not had any weight gain.  His L shoulder still hurts with significantly decreased range of motion.  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  Pain is described as severe.  He complains of a penile deformity since his hernia repair and this has led to erectile dysfunction.  States he had seen a Careers advisersurgeon in the past who said he would need to undergo surgical procedure or receive an injection to rectify his condition.  He is requesting medication for ED.  He has blurry vision and has not been to see an ophthalmologist for his annual eye exams.  His blood sugar is elevated at 412 in the clinic and he has not been taking Lantus in a while.  This is a pattern for him as he always has an excuse as to why he has been unable to administer his insulin.  He informs me he has a lot going on in his life, he has no male friend to help him monitor his medications. Past Medical History:  Diagnosis Date   (HFpEF) heart failure with preserved ejection fraction (HCC) 03/30/2018   Accelerated hypertension 06/05/2018   Acute diverticulitis 08/29/2017   Acute viral bronchitis 08/24/2017   Angina pectoris (HCC) 03/29/2018    Anxiety    Anxiety and depression 02/06/2018   Asthma with status asthmaticus    Chest tightness 03/29/2018   CHF (congestive heart failure) (HCC)    "fluttering"   Depression    Diabetes mellitus due to underlying condition with unspecified complications (HCC) 01/09/2020   Diabetes mellitus without complication (HCC)    Diverticulitis    Diverticulitis large intestine 05/31/2018   GERD (gastroesophageal reflux disease)    Hyperlipidemia    Hypertension    Hypertensive urgency 08/24/2017   Microcytic anemia 12/19/2018   Mixed dyslipidemia 01/09/2020   Palpitations 03/29/2018   Peritonitis (HCC) 09/02/2017   Pneumonia 12/16/2018   Polysubstance abuse (HCC) 12/19/2018   Prolonged Q-T interval on ECG 02/06/2018   S/P colostomy (HCC) 09/16/2017   Unstable angina (HCC) 03/29/2018    Past Surgical History:  Procedure Laterality Date   CARDIAC CATHETERIZATION  03/30/2018   COLON RESECTION N/A 09/05/2017   Procedure: HARTMAN'S COLECTOMY AND COLOSTOMY;  Surgeon: Rodman PickleKinsinger, Luke Aaron, MD;  Location: [facility] OR;  Service: General;  Laterality: N/A;   COLON SURGERY     COLOSTOMY TAKEDOWN N/A 05/31/2018   Procedure: LAPAROSCOPIC COLOSTOMY REVERSAL COLORECTAL ANASTOMOSIS ERAS PATHWAY;  Surgeon: Sheliah HatchKinsinger, De BlanchLuke Aaron, MD;  Location: [facility] OR;  Service: General;  Laterality: N/A;   INTRAVASCULAR PRESSURE WIRE/FFR STUDY N/A 03/30/2018   Procedure: INTRAVASCULAR PRESSURE WIRE/FFR STUDY;  Surgeon: Yvonne KendallEnd, Christopher, MD;  Location: [facility] INVASIVE CV LAB;  Service: Cardiovascular;  Laterality: N/A;   LEFT HEART CATH AND CORONARY ANGIOGRAPHY N/A 03/30/2018   Procedure: LEFT HEART CATH AND CORONARY ANGIOGRAPHY;  Surgeon: Yvonne Kendall, MD;  Location: [facility] INVASIVE CV LAB;  Service: Cardiovascular;  Laterality: N/A;    Family History  Problem Relation Age of Onset   Heart disease Mother    Heart disease Sister    Diabetes Maternal Aunt    Heart disease Maternal Aunt    Diabetes Maternal Uncle    Sudden Cardiac Death Neg Hx      No Known Allergies  Outpatient Medications Prior to Visit  Medication Sig Dispense Refill   amLODipine (NORVASC) 10 MG tablet Take 1 tablet (10 mg total) by mouth daily. 30 tablet 0   atorvastatin (LIPITOR) 80 MG tablet Take 1 tablet (80 mg total) by mouth daily. 30 tablet 6   carvedilol (COREG) 6.25 MG tablet Take 1 tablet (6.25 mg total) by mouth 2 (two) times daily with a meal. 60 tablet 6   cetirizine (ZYRTEC) 10 MG tablet Take 1 tablet (10 mg total) by mouth daily. 30 tablet 0   fluticasone (FLONASE) 50 MCG/ACT nasal spray Place 2 sprays into both nostrils daily. 16 g 0   furosemide (LASIX) 40 MG tablet Take 1 tablet (40 mg total) by mouth daily. 30 tablet 6   gabapentin (NEURONTIN) 300 MG capsule Take 2 capsules (600 mg total) by mouth 2 (two) times daily. 120 capsule 3   insulin glargine (LANTUS) 100 UNIT/ML Solostar Pen Inject 50 Units into the skin daily. 30 mL 6   Insulin Pen Needle (BD PEN NEEDLE NANO U/F) 32G X 4 MM MISC use to inject insulin once daily 100 each 11   lisinopril (ZESTRIL) 20 MG tablet Take 1 tablet (20 mg total) by mouth daily. 30 tablet 6   meloxicam (MOBIC) 7.5 MG tablet Take 1 tablet (7.5 mg total) by mouth daily. 30 tablet 1   metFORMIN (GLUCOPHAGE) 500 MG tablet Take 2 tablets (1,000 mg total) by mouth 2 (two) times daily with a meal. 120 tablet 6   cyclobenzaprine (FLEXERIL) 10 MG tablet Take 1 tablet (10 mg total) by mouth 2 (two) times daily as needed for muscle spasms. 30 tablet 0   sildenafil (VIAGRA) 50 MG tablet Take 1 tablet (50 mg total) by mouth daily as needed for erectile dysfunction. At least 24 hrs between doses.  Don't take with nitroglycerin 10 tablet 0   tamsulosin (FLOMAX) 0.4 MG CAPS capsule Take 1 capsule (0.4 mg total) by mouth daily. 30 capsule 0   amoxicillin (AMOXIL) 500 MG capsule Take 500 mg by mouth 3 (three) times daily. (Patient not taking: Reported on 08/26/2021)     No facility-administered medications prior to visit.      ROS Review of Systems  Constitutional:  Negative for activity change and appetite change.  HENT:  Negative for sinus pressure and sore throat.   Eyes:  Negative for visual disturbance.  Respiratory:  Negative for cough, chest tightness and shortness of breath.   Cardiovascular:  Negative for chest pain and leg swelling.  Gastrointestinal:  Negative for abdominal distention, abdominal pain, constipation and diarrhea.  Endocrine: Negative.   Genitourinary:  Negative for dysuria.  Musculoskeletal:        See HPI  Skin:  Negative for rash.  Allergic/Immunologic: Negative.   Neurological:  Negative for weakness, light-headedness and numbness.  Psychiatric/Behavioral:  Negative for dysphoric mood and suicidal ideas.    Objective:  BP (!) 156/101  Pulse 95    Ht 5\' 9"  (1.753 m)    Wt 175 lb 9.6 oz (79.7 kg)    SpO2 98%    BMI 25.93 kg/m   BP/Weight 08/26/2021 05/14/2021 10/03/2020  Systolic BP 156 154 -  Diastolic BP 101 95 -  Wt. (Lbs) 175.6 179.2 205  BMI 25.93 26.46 30.27      Physical Exam Constitutional:      Appearance: He is well-developed.  Cardiovascular:     Rate and Rhythm: Normal rate.     Heart sounds: Normal heart sounds. No murmur heard. Pulmonary:     Effort: Pulmonary effort is normal.     Breath sounds: Normal breath sounds. No wheezing or rales.  Chest:     Chest wall: No tenderness.  Abdominal:     General: Bowel sounds are normal. There is no distension.     Palpations: Abdomen is soft. There is no mass.     Tenderness: There is no abdominal tenderness.  Musculoskeletal:        General: Normal range of motion.     Right lower leg: No edema.     Left lower leg: No edema.     Comments: Left shoulder active abduction restricted to 60 degrees Tenderness on palpation of anterior and superior aspect of left shoulder joint Right shoulder is normal  Neurological:     Mental Status: He is alert and oriented to person, place, and time.  Psychiatric:         Mood and Affect: Mood normal.    CMP Latest Ref Rng & Units 05/14/2021 05/19/2020 11/26/2019  Glucose 70 - 99 mg/dL 161(WR) 604(V) 409(WJ)  BUN 6 - 24 mg/dL 12 11 18   Creatinine 0.76 - 1.27 mg/dL 1.91 4.78 2.95(A)  Sodium 134 - 144 mmol/L 133(L) 136 134  Potassium 3.5 - 5.2 mmol/L 5.0 4.6 4.9  Chloride 96 - 106 mmol/L 94(L) 99 92(L)  CO2 20 - 29 mmol/L 24 25 22   Calcium 8.7 - 10.2 mg/dL 9.8 9.6 10.5(H)  Total Protein 6.0 - 8.5 g/dL 7.0 7.2 -  Total Bilirubin 0.0 - 1.2 mg/dL 0.3 0.4 -  Alkaline Phos 44 - 121 IU/L 92 81 -  AST 0 - 40 IU/L 10 10 -  ALT 0 - 44 IU/L 15 13 -    Lipid Panel     Component Value Date/Time   CHOL 223 (H) 05/14/2021 1215   TRIG 267 (H) 05/14/2021 1215   HDL 55 05/14/2021 1215   CHOLHDL 4.1 05/14/2021 1215   CHOLHDL 6.1 03/31/2018 0227   VLDL UNABLE TO CALCULATE IF TRIGLYCERIDE OVER 400 mg/dL 21/30/8657 8469   LDLCALC 121 (H) 05/14/2021 1215    CBC    Component Value Date/Time   WBC 5.8 06/05/2018 0820   RBC 3.35 (L) 06/05/2018 0820   HGB 9.2 (L) 06/05/2018 0820   HGB 14.9 03/29/2018 1120   HCT 27.2 (L) 06/05/2018 0820   HCT 42.2 03/29/2018 1120   PLT 331 06/05/2018 0820   PLT 300 03/29/2018 1120   MCV 81.2 06/05/2018 0820   MCV 84 03/29/2018 1120   MCV 82 03/14/2014 0400   MCH 27.5 06/05/2018 0820   MCHC 33.8 06/05/2018 0820   RDW 12.5 06/05/2018 0820   RDW 13.7 03/29/2018 1120   RDW 13.5 03/14/2014 0400   LYMPHSABS 2.4 06/05/2018 0820   LYMPHSABS 0.9 (L) 03/14/2014 0400   MONOABS 0.4 06/05/2018 0820   MONOABS 0.1 (L) 03/14/2014 0400   EOSABS 0.4 06/05/2018  0820   EOSABS 0.0 03/14/2014 0400   BASOSABS 0.0 06/05/2018 0820   BASOSABS 0.0 03/14/2014 0400    Lab Results  Component Value Date   HGBA1C 14.0 (A) 08/26/2021    Assessment & Plan:   Problem List Items Addressed This Visit       Cardiovascular and Mediastinum   Hypertensive heart disease with chronic combined systolic and diastolic congestive heart failure (HCC)     EF of 60 to 65% from echo 12/2018 Given complaints of dyspnea I will send off a BNP even though he appears euvolemic on exam Consider initiation of SGLT2 at his next visit      Relevant Medications   sildenafil (VIAGRA) 100 MG tablet   Other Relevant Orders   Brain natriuretic peptide (Completed)   Hypertension associated with diabetes (HCC)    Uncontrolled due to medication nonadherence I have refilled his medications and encouraged him to restart them He does have refills at the pharmacy and we have contacted the pharmacy to fill this for him Counseled on blood pressure goal of less than 130/80, low-sodium, DASH diet, medication compliance, 150 minutes of moderate intensity exercise per week. Discussed medication compliance, adverse effects.       Relevant Medications   sildenafil (VIAGRA) 100 MG tablet     Endocrine   Type 2 diabetes mellitus with hyperglycemia, with long-term current use of insulin (HCC) - Primary    Severe hyperglycemia of 412 at risk of progression to DKA largely due to noncompliance NovoLog 15 units administered and oral hydration provided.  Patient observed for 40 minutes after which blood sugar repeated Strongly encouraged to comply with his medications and we have discussed implications of noncompliance and complications of diabetes He does seem to have a pattern of medication nonadherence unfortunately which places him at severe risk of cardiovascular complications in the setting of the already existing hypertensive heart disease Counseled on Diabetic diet, my plate method, 202 minutes of moderate intensity exercise/week Blood sugar logs with fasting goals of 80-120 mg/dl, random of less than 542 and in the event of sugars less than 60 mg/dl or greater than 706 mg/dl encouraged to notify the clinic. Advised on the need for annual eye exams, annual foot exams, Pneumonia vaccine.       Relevant Orders   POCT glucose (manual entry) (Completed)   POCT  glycosylated hemoglobin (Hb A1C) (Completed)   Ambulatory referral to Ophthalmology   POCT glucose (manual entry) (Completed)     Musculoskeletal and Integument   Adhesive capsulitis of left shoulder    Uncontrolled Flexeril prescribed He will benefit from cortisone injection and has been referred to orthopedics Consider PT down the road if symptoms persist      Relevant Medications   cyclobenzaprine (FLEXERIL) 10 MG tablet   Other Visit Diagnoses     Other male erectile dysfunction       Relevant Medications   sildenafil (VIAGRA) 100 MG tablet   Periodic heart flutter     EKG unchanged from previous Cannot exclude underlying anxiety Send off thyroid labs   Relevant Orders   T4, free (Completed)   TSH (Completed)         Health Care Maintenance: Referred to ophthalmology for diabetic eye exam Meds ordered this encounter  Medications   sildenafil (VIAGRA) 100 MG tablet    Sig: Take 1 tablet (100 mg total) by mouth daily as needed for erectile dysfunction. At least 24 hrs between doses.  Don't take with nitroglycerin  Dispense:  10 tablet    Refill:  1   cyclobenzaprine (FLEXERIL) 10 MG tablet    Sig: Take 1 tablet (10 mg total) by mouth 2 (two) times daily as needed for muscle spasms.    Dispense:  60 tablet    Refill:  1   tamsulosin (FLOMAX) 0.4 MG CAPS capsule    Sig: Take 1 capsule (0.4 mg total) by mouth daily.    Dispense:  30 capsule    Refill:  3   insulin aspart (novoLOG) injection 15 Units    Follow-up: Return in about 1 month (around 09/26/2021) for Chronic medical conditions.    46 minutes of total face to face time spent including median intraservice time reviewing previous notes and test results, counseling patient on diagnosis and work up of periodic heart flutter in addition to management of chronic medical conditions.Time also spent ordering medications, investigations and documenting in the chart.  All questions were answered to the patient's  satisfaction   Hoy RegisterEnobong Julitza Rickles, MD, FAAFP. [facility] [place], [place] [phone]   08/27/2021, 2:03 PM

## 2021-08-26 NOTE — Patient Instructions (Signed)

## 2021-08-26 NOTE — Progress Notes (Signed)
Need refills on medications. 

## 2021-08-27 ENCOUNTER — Other Ambulatory Visit: Payer: Self-pay | Admitting: Family Medicine

## 2021-08-27 ENCOUNTER — Other Ambulatory Visit: Payer: Self-pay

## 2021-08-27 ENCOUNTER — Telehealth: Payer: Self-pay

## 2021-08-27 DIAGNOSIS — M7502 Adhesive capsulitis of left shoulder: Secondary | ICD-10-CM | POA: Insufficient documentation

## 2021-08-27 DIAGNOSIS — G8929 Other chronic pain: Secondary | ICD-10-CM

## 2021-08-27 DIAGNOSIS — E1165 Type 2 diabetes mellitus with hyperglycemia: Secondary | ICD-10-CM

## 2021-08-27 DIAGNOSIS — Z794 Long term (current) use of insulin: Secondary | ICD-10-CM | POA: Insufficient documentation

## 2021-08-27 HISTORY — DX: Long term (current) use of insulin: E11.65

## 2021-08-27 HISTORY — DX: Adhesive capsulitis of left shoulder: M75.02

## 2021-08-27 LAB — T4, FREE: Free T4: 1.2 ng/dL (ref 0.82–1.77)

## 2021-08-27 LAB — TSH: TSH: 1.32 u[IU]/mL (ref 0.450–4.500)

## 2021-08-27 LAB — BRAIN NATRIURETIC PEPTIDE: BNP: 5.6 pg/mL (ref 0.0–100.0)

## 2021-08-27 MED ORDER — MELOXICAM 7.5 MG PO TABS
7.5000 mg | ORAL_TABLET | Freq: Every day | ORAL | 2 refills | Status: DC
  Filled 2021-08-27: qty 30, 30d supply, fill #0
  Filled 2021-12-08: qty 30, 30d supply, fill #1
  Filled 2021-12-08: qty 30, 30d supply, fill #0
  Filled 2022-04-27 – 2022-07-15 (×3): qty 30, 30d supply, fill #1

## 2021-08-27 MED ORDER — CETIRIZINE HCL 10 MG PO TABS
10.0000 mg | ORAL_TABLET | Freq: Every day | ORAL | 2 refills | Status: DC
  Filled 2021-08-27: qty 30, 30d supply, fill #0
  Filled 2021-12-08: qty 30, 30d supply, fill #1
  Filled 2021-12-08: qty 30, 30d supply, fill #0
  Filled 2022-07-15: qty 30, 30d supply, fill #1

## 2021-08-27 MED ORDER — AMLODIPINE BESYLATE 10 MG PO TABS
10.0000 mg | ORAL_TABLET | Freq: Every day | ORAL | 2 refills | Status: DC
  Filled 2021-08-27 – 2021-12-08 (×2): qty 30, 30d supply, fill #0
  Filled 2021-12-08: qty 30, 30d supply, fill #1

## 2021-08-27 NOTE — Telephone Encounter (Signed)
-----   Message from Hoy Register, MD sent at 08/27/2021  1:52 PM EST ----- Please inform the patient that labs are normal. Thank you.

## 2021-08-27 NOTE — Assessment & Plan Note (Signed)
Severe hyperglycemia of 412 at risk of progression to DKA largely due to noncompliance NovoLog 15 units administered and oral hydration provided.  Patient observed for 40 minutes after which blood sugar repeated Strongly encouraged to comply with his medications and we have discussed implications of noncompliance and complications of diabetes He does seem to have a pattern of medication nonadherence unfortunately which places him at severe risk of cardiovascular complications in the setting of the already existing hypertensive heart disease Counseled on Diabetic diet, my plate method, 094 minutes of moderate intensity exercise/week Blood sugar logs with fasting goals of 80-120 mg/dl, random of less than 076 and in the event of sugars less than 60 mg/dl or greater than 808 mg/dl encouraged to notify the clinic. Advised on the need for annual eye exams, annual foot exams, Pneumonia vaccine.

## 2021-08-27 NOTE — Assessment & Plan Note (Signed)
Uncontrolled due to medication nonadherence I have refilled his medications and encouraged him to restart them He does have refills at the pharmacy and we have contacted the pharmacy to fill this for him Counseled on blood pressure goal of less than 130/80, low-sodium, DASH diet, medication compliance, 150 minutes of moderate intensity exercise per week. Discussed medication compliance, adverse effects.

## 2021-08-27 NOTE — Assessment & Plan Note (Signed)
EF of 60 to 65% from echo 12/2018 Given complaints of dyspnea I will send off a BNP even though he appears euvolemic on exam Consider initiation of SGLT2 at his next visit

## 2021-08-27 NOTE — Assessment & Plan Note (Signed)
Uncontrolled Flexeril prescribed He will benefit from cortisone injection and has been referred to orthopedics Consider PT down the road if symptoms persist

## 2021-08-27 NOTE — Telephone Encounter (Signed)
Patient name and DOB has been verified Patient was informed of lab results. Patient had no questions.  

## 2021-08-28 ENCOUNTER — Other Ambulatory Visit: Payer: Self-pay

## 2021-08-31 ENCOUNTER — Other Ambulatory Visit: Payer: Self-pay

## 2021-09-09 ENCOUNTER — Ambulatory Visit: Payer: Medicaid Other | Admitting: Orthopaedic Surgery

## 2021-09-14 DIAGNOSIS — I10 Essential (primary) hypertension: Secondary | ICD-10-CM | POA: Insufficient documentation

## 2021-09-14 DIAGNOSIS — I509 Heart failure, unspecified: Secondary | ICD-10-CM | POA: Insufficient documentation

## 2021-09-18 ENCOUNTER — Ambulatory Visit: Payer: Medicaid Other | Admitting: Cardiology

## 2021-09-30 ENCOUNTER — Ambulatory Visit: Payer: Medicaid Other | Admitting: Family Medicine

## 2021-10-19 ENCOUNTER — Ambulatory Visit: Payer: Medicaid Other | Admitting: Cardiology

## 2021-11-04 ENCOUNTER — Ambulatory Visit: Payer: Medicaid Other | Admitting: Cardiology

## 2021-11-16 ENCOUNTER — Ambulatory Visit: Payer: Medicaid Other | Admitting: Cardiology

## 2021-12-03 ENCOUNTER — Encounter: Payer: Self-pay | Admitting: Cardiology

## 2021-12-03 ENCOUNTER — Ambulatory Visit (INDEPENDENT_AMBULATORY_CARE_PROVIDER_SITE_OTHER): Payer: Medicaid Other

## 2021-12-03 ENCOUNTER — Ambulatory Visit (INDEPENDENT_AMBULATORY_CARE_PROVIDER_SITE_OTHER): Payer: Medicaid Other | Admitting: Cardiology

## 2021-12-03 VITALS — BP 138/74 | HR 90 | Ht 69.0 in | Wt 176.0 lb

## 2021-12-03 DIAGNOSIS — I2510 Atherosclerotic heart disease of native coronary artery without angina pectoris: Secondary | ICD-10-CM | POA: Diagnosis not present

## 2021-12-03 DIAGNOSIS — Z794 Long term (current) use of insulin: Secondary | ICD-10-CM | POA: Diagnosis not present

## 2021-12-03 DIAGNOSIS — E782 Mixed hyperlipidemia: Secondary | ICD-10-CM

## 2021-12-03 DIAGNOSIS — E1165 Type 2 diabetes mellitus with hyperglycemia: Secondary | ICD-10-CM

## 2021-12-03 DIAGNOSIS — R55 Syncope and collapse: Secondary | ICD-10-CM

## 2021-12-03 DIAGNOSIS — I10 Essential (primary) hypertension: Secondary | ICD-10-CM

## 2021-12-03 HISTORY — DX: Atherosclerotic heart disease of native coronary artery without angina pectoris: I25.10

## 2021-12-03 NOTE — Patient Instructions (Signed)
Medication Instructions:  ?Your physician has recommended you make the following change in your medication:  ? ?Start taking 81 mg coated aspirin daily. ? ?*If you need a refill on your cardiac medications before your next appointment, please call your pharmacy* ? ? ?Lab Work: ?Your physician recommends that you have labs done in the office today. Your test included  basic metabolic panel, complete blood count, TSH, vitamin D, hgb A1C, liver function and lipids. ? ?If you have labs (blood work) drawn today and your tests are completely normal, you will receive your results only by: ?MyChart Message (if you have MyChart) OR ?A paper copy in the mail ?If you have any lab test that is abnormal or we need to change your treatment, we will call you to review the results. ? ? ?Testing/Procedures: ?None ordered ? ? ?Follow-Up: ?At [facility], you and your health needs are our priority.  As part of our continuing mission to provide you with exceptional heart care, we have created designated Provider Care Teams.  These Care Teams include your primary Cardiologist (physician) and Advanced Practice Providers (APPs -  Physician Assistants and Nurse Practitioners) who all work together to provide you with the care you need, when you need it. ? ?We recommend signing up for the patient portal called "MyChart".  Sign up information is provided on this After Visit Summary.  MyChart is used to connect with patients for Virtual Visits (Telemedicine).  Patients are able to view lab/test results, encounter notes, upcoming appointments, etc.  Non-urgent messages can be sent to your provider as well.   ?To learn more about what you can do with MyChart, go to [email or web address].   ? ?Your next appointment:   ?9 month(s) ? ?The format for your next appointment:   ?In Person ? ?Provider:   ?Jyl Heinz, MD ? ? ?Other Instructions ?NA   ?

## 2021-12-03 NOTE — Progress Notes (Addendum)
?Cardiology Office Note:   ? ?Date:  12/03/2021  ? ?ID:  Kenneth Rios Pantaleo, DOB 21-Nov-1965, MRN [id number] ? ?PCP:  Hoy RegisterNewlin, Enobong, MD  ?Cardiologist:  Garwin Brothersajan R Serenitie Vinton, MD  ? ?Referring MD: Hoy RegisterNewlin, Enobong, MD  ? ? ?ASSESSMENT:   ? ?1. Primary hypertension   ?2. Type 2 diabetes mellitus with hyperglycemia, with long-term current use of insulin (HCC)   ?3. Mixed hyperlipidemia   ?4. Coronary artery disease involving native coronary artery of native heart without angina pectoris   ? ?PLAN:   ? ?In order of problems listed above: ? ?Coronary artery disease: Secondary prevention stressed with the patient.  Importance of compliance with diet medication stressed any vocalized understanding.  He was advised to walk at least half an hour a day 5 days a week and he promises to do so. ?Essential hypertension: Stable.  Lifestyle modification urged. ?Mixed dyslipidemia: Lipids reviewed he is fasting and will have blood work today. ?Diabetes mellitus: Uncontrolled.  [facility]  He discussed this with him at length and he promises to do better.  This is followed by primary care.  We will check A1c today and sent to primary care today.  Risks of uncontrolled diabetes explained extensively and questions were answered to satisfaction. ?Patient will be seen in follow-up appointment in 9 months or earlier if the patient has any concerns ? ? ?At the end of the appointment the patient's fianc? was sitting in the waiting area let my nurse know that he has passing out spells at times.  He denies any chest pain with activities of daily living.  He just has passing out spells.  I do not see much of a warning for these spells.  Patient is a poor historian.  I told him to check with his primary care physician and he may also need a neurology evaluation.  Overall his hemoglobin's A1c is markedly elevated.  To evaluate this we will get TSH CBC, other lab work and also do a 2-week monitoring.  I just wanted to  document this conversation since it happened after I signed the chart. ? ? ?Medication Adjustments/Labs and Tests Ordered: ?Current medicines are reviewed at length with the patient today.  Concerns regarding medicines are outlined above.  ?No orders of the defined types were placed in this encounter. ? ?No orders of the defined types were placed in this encounter. ? ? ? ?No chief complaint on file. ?  ? ?History of Present Illness:   ? ?Kenneth Cobblenthony Rios Bhakta is a 56 y.o. male.  Patient has past medical history of coronary artery disease, essential hypertension, dyslipidemia and uncontrolled diabetes mellitus.  KPN sheet reviewed reveals his hemoglobin A1c to be 14 in January.  He denies any problems at this time and takes care of activities of daily living.  No chest pain orthopnea or PND.  At the time of my evaluation, the patient is alert awake oriented and in no distress. ? ?Past Medical History:  ?Diagnosis Date  ? (HFpEF) heart failure with preserved ejection fraction (HCC) 03/30/2018  ? Accelerated hypertension 06/05/2018  ? Acute diverticulitis 08/29/2017  ? Acute viral bronchitis 08/24/2017  ? Adhesive capsulitis of left shoulder 08/27/2021  ? Angina pectoris (HCC) 03/29/2018  ? Anxiety   ? Anxiety and depression 02/06/2018  ? Asthma with status asthmaticus   ? Chest tightness 03/29/2018  ? CHF (congestive heart failure) (HCC)   ? "fluttering"  ? Depression   ? Diabetes mellitus  due to underlying condition with unspecified complications (HCC) 01/09/2020  ? Diabetes mellitus without complication (HCC)   ? Diverticulitis   ? Diverticulitis large intestine 05/31/2018  ? GERD (gastroesophageal reflux disease)   ? Hyperlipidemia   ? Hypertension   ? Hypertension associated with diabetes (HCC) 08/24/2017  ? Hypertensive heart disease with chronic combined systolic and diastolic congestive heart failure (HCC) 08/24/2017  ? Hypertensive urgency 08/24/2017  ? Microcytic anemia 12/19/2018  ? Mixed dyslipidemia 01/09/2020  ? Palpitations  03/29/2018  ? Peritonitis (HCC) 09/02/2017  ? Pneumonia 12/16/2018  ? Polysubstance abuse (HCC) 12/19/2018  ? Prolonged Q-T interval on ECG 02/06/2018  ? S/P colostomy (HCC) 09/16/2017  ? Type 2 diabetes mellitus with hyperglycemia, with long-term current use of insulin (HCC) 08/27/2021  ? Unstable angina (HCC) 03/29/2018  ? ? ?Past Surgical History:  ?Procedure Laterality Date  ? CARDIAC CATHETERIZATION  03/30/2018  ? COLON RESECTION N/A 09/05/2017  ? Procedure: HARTMAN'S COLECTOMY AND COLOSTOMY;  Surgeon: [name], De Blanch, MD;  Location: [facility] OR;  Service: General;  Laterality: N/A;  ? COLON SURGERY    ? COLOSTOMY TAKEDOWN N/A 05/31/2018  ? Procedure: LAPAROSCOPIC COLOSTOMY REVERSAL COLORECTAL ANASTOMOSIS ERAS PATHWAY;  Surgeon: [name], De Blanch, MD;  Location: [facility] OR;  Service: General;  Laterality: N/A;  ? INTRAVASCULAR PRESSURE WIRE/FFR STUDY N/A 03/30/2018  ? Procedure: INTRAVASCULAR PRESSURE WIRE/FFR STUDY;  Surgeon: Yvonne Kendall, MD;  Location: [facility] INVASIVE CV LAB;  Service: Cardiovascular;  Laterality: N/A;  ? LEFT HEART CATH AND CORONARY ANGIOGRAPHY N/A 03/30/2018  ? Procedure: LEFT HEART CATH AND CORONARY ANGIOGRAPHY;  Surgeon: Yvonne Kendall, MD;  Location: [facility] INVASIVE CV LAB;  Service: Cardiovascular;  Laterality: N/A;  ? ? ?Current Medications: ?Current Meds  ?Medication Sig  ? amLODipine (NORVASC) 10 MG tablet Take 1 tablet (10 mg total) by mouth daily.  ? atorvastatin (LIPITOR) 80 MG tablet Take 1 tablet (80 mg total) by mouth daily.  ? carvedilol (COREG) 6.25 MG tablet Take 1 tablet (6.25 mg total) by mouth 2 (two) times daily with a meal.  ? cetirizine (ZYRTEC) 10 MG tablet Take 1 tablet (10 mg total) by mouth daily.  ? cyclobenzaprine (FLEXERIL) 10 MG tablet Take 1 tablet (10 mg total) by mouth 2 (two) times daily as needed for muscle spasms.  ? fluticasone (FLONASE) 50 MCG/ACT nasal spray Place 2 sprays into both nostrils daily.  ? furosemide (LASIX) 40 MG tablet Take 1 tablet (40 mg total) by  mouth daily.  ? gabapentin (NEURONTIN) 300 MG capsule Take 2 capsules (600 mg total) by mouth 2 (two) times daily.  ? insulin glargine (LANTUS) 100 UNIT/ML Solostar Pen Inject 50 Units into the skin daily.  ? Insulin Pen Needle (BD PEN NEEDLE NANO U/F) 32G X 4 MM MISC use to inject insulin once daily  ? lisinopril (ZESTRIL) 20 MG tablet Take 1 tablet (20 mg total) by mouth daily.  ? meloxicam (MOBIC) 7.5 MG tablet Take 1 tablet (7.5 mg total) by mouth daily.  ? metFORMIN (GLUCOPHAGE) 500 MG tablet Take 2 tablets (1,000 mg total) by mouth 2 (two) times daily with a meal.  ? sildenafil (VIAGRA) 100 MG tablet Take 1 tablet (100 mg total) by mouth daily as needed for erectile dysfunction. At least 24 hrs between doses.  Don't take with nitroglycerin  ? tamsulosin (FLOMAX) 0.4 MG CAPS capsule Take 1 capsule (0.4 mg total) by mouth daily.  ?  ? ?Allergies:   Patient has no known allergies.  ? ?Social History  ? ?  Socioeconomic History  ? Marital status: Single  ?  Spouse name: Not on file  ? Number of children: 3  ? Years of education: Not on file  ? Highest education level: Not on file  ?Occupational History  ? Occupation: Disabled  ?Tobacco Use  ? Smoking status: Former  ? Smokeless tobacco: Never  ?Vaping Use  ? Vaping Use: Never used  ?Substance and Sexual Activity  ? Alcohol use: Yes  ?  Alcohol/week: 6.0 standard drinks  ?  Types: 6 Cans of beer per week  ?  Comment: on the weekends  ? Drug use: No  ? Sexual activity: Yes  ?  Partners: Female  ?Other Topics Concern  ? Not on file  ?Social History Narrative  ? Not on file  ? ?Social Determinants of Health  ? ?Financial Resource Strain: Not on file  ?Food Insecurity: Not on file  ?Transportation Needs: Not on file  ?Physical Activity: Not on file  ?Stress: Not on file  ?Social Connections: Not on file  ?  ? ?Family History: ?The patient's family history includes Diabetes in his maternal aunt and maternal uncle; Heart disease in his maternal aunt, mother, and sister.  There is no history of Sudden Cardiac Death. ? ?ROS:   ?Please see the history of present illness.    ?All other systems reviewed and are negative. ? ?EKGs/Labs/Other Studies Reviewed:   ? ?The following s

## 2021-12-03 NOTE — Addendum Note (Signed)
Addended by: Eleonore Chiquito on: 12/03/2021 08:59 AM ? ? Modules accepted: Orders ? ?

## 2021-12-04 LAB — CBC WITH DIFFERENTIAL/PLATELET
Basophils Absolute: 0 10*3/uL (ref 0.0–0.2)
Basos: 1 %
EOS (ABSOLUTE): 0.2 10*3/uL (ref 0.0–0.4)
Eos: 4 %
Hematocrit: 40 % (ref 37.5–51.0)
Hemoglobin: 14.1 g/dL (ref 13.0–17.7)
Immature Grans (Abs): 0 10*3/uL (ref 0.0–0.1)
Immature Granulocytes: 0 %
Lymphocytes Absolute: 2.4 10*3/uL (ref 0.7–3.1)
Lymphs: 52 %
MCH: 29.4 pg (ref 26.6–33.0)
MCHC: 35.3 g/dL (ref 31.5–35.7)
MCV: 84 fL (ref 79–97)
Monocytes Absolute: 0.3 10*3/uL (ref 0.1–0.9)
Monocytes: 7 %
Neutrophils Absolute: 1.7 10*3/uL (ref 1.4–7.0)
Neutrophils: 36 %
Platelets: 278 10*3/uL (ref 150–450)
RBC: 4.79 x10E6/uL (ref 4.14–5.80)
RDW: 13.1 % (ref 11.6–15.4)
WBC: 4.6 10*3/uL (ref 3.4–10.8)

## 2021-12-04 LAB — LIPID PANEL
Chol/HDL Ratio: 5 ratio (ref 0.0–5.0)
Cholesterol, Total: 245 mg/dL — ABNORMAL HIGH (ref 100–199)
HDL: 49 mg/dL (ref >39)
LDL Chol Calc (NIH): 171 mg/dL — ABNORMAL HIGH (ref 0–99)
Triglycerides: 138 mg/dL (ref 0–149)
VLDL Cholesterol Cal: 25 mg/dL (ref 5–40)

## 2021-12-04 LAB — BASIC METABOLIC PANEL
BUN/Creatinine Ratio: 9 (ref 9–20)
BUN: 9 mg/dL (ref 6–24)
CO2: 24 mmol/L (ref 20–29)
Calcium: 9.9 mg/dL (ref 8.7–10.2)
Chloride: 100 mmol/L (ref 96–106)
Creatinine, Ser: 0.98 mg/dL (ref 0.76–1.27)
Glucose: 353 mg/dL — ABNORMAL HIGH (ref 70–99)
Potassium: 4.8 mmol/L (ref 3.5–5.2)
Sodium: 138 mmol/L (ref 134–144)
eGFR: 91 mL/min/{1.73_m2} (ref >59)

## 2021-12-04 LAB — TSH: TSH: 1.65 u[IU]/mL (ref 0.450–4.500)

## 2021-12-04 LAB — HEPATIC FUNCTION PANEL
ALT: 11 IU/L (ref 0–44)
AST: 12 IU/L (ref 0–40)
Albumin: 4.2 g/dL (ref 3.8–4.9)
Alkaline Phosphatase: 65 IU/L (ref 44–121)
Bilirubin Total: 0.6 mg/dL (ref 0.0–1.2)
Bilirubin, Direct: 0.12 mg/dL (ref 0.00–0.40)
Total Protein: 6.7 g/dL (ref 6.0–8.5)

## 2021-12-04 LAB — VITAMIN D 25 HYDROXY (VIT D DEFICIENCY, FRACTURES): Vit D, 25-Hydroxy: 17.5 ng/mL — ABNORMAL LOW (ref 30.0–100.0)

## 2021-12-04 LAB — HEMOGLOBIN A1C
Est. average glucose Bld gHb Est-mCnc: 335 mg/dL
Hgb A1c MFr Bld: 13.3 % — ABNORMAL HIGH (ref 4.8–5.6)

## 2021-12-08 ENCOUNTER — Other Ambulatory Visit (HOSPITAL_COMMUNITY): Payer: Self-pay

## 2021-12-08 ENCOUNTER — Other Ambulatory Visit: Payer: Self-pay | Admitting: Pharmacist

## 2021-12-08 ENCOUNTER — Other Ambulatory Visit: Payer: Self-pay

## 2021-12-08 ENCOUNTER — Ambulatory Visit: Payer: Medicaid Other | Admitting: Family Medicine

## 2021-12-08 DIAGNOSIS — I10 Essential (primary) hypertension: Secondary | ICD-10-CM

## 2021-12-08 DIAGNOSIS — E1165 Type 2 diabetes mellitus with hyperglycemia: Secondary | ICD-10-CM

## 2021-12-08 MED ORDER — ACCU-CHEK GUIDE W/DEVICE KIT
PACK | 0 refills | Status: DC
  Filled 2021-12-08: qty 1, fill #0
  Filled 2021-12-08: qty 1, 30d supply, fill #0

## 2021-12-08 MED ORDER — ACCU-CHEK SOFTCLIX LANCETS MISC
2 refills | Status: DC
  Filled 2021-12-08: qty 100, 30d supply, fill #0
  Filled 2021-12-08: qty 100, fill #0

## 2021-12-08 MED ORDER — ROSUVASTATIN CALCIUM 10 MG PO TABS
10.0000 mg | ORAL_TABLET | Freq: Every day | ORAL | 3 refills | Status: DC
  Filled 2021-12-08 (×2): qty 90, 90d supply, fill #0
  Filled 2022-07-15 (×2): qty 90, 90d supply, fill #1

## 2021-12-08 MED ORDER — ACCU-CHEK GUIDE VI STRP
ORAL_STRIP | 2 refills | Status: DC
  Filled 2021-12-08: qty 100, fill #0
  Filled 2021-12-08: qty 100, 30d supply, fill #0

## 2021-12-08 NOTE — Addendum Note (Signed)
Addended by: Eleonore Chiquito on: 12/08/2021 09:44 AM ? ? Modules accepted: Orders ? ?

## 2021-12-08 NOTE — Addendum Note (Signed)
Addended by: Truddie Hidden on: 12/08/2021 09:30 AM ? ? Modules accepted: Orders ? ?

## 2021-12-08 NOTE — Chronic Care Management (AMB) (Signed)
? ?   ? ?Chief Complaint  ?Patient presents with  ? Hypertension  ? ? ?Kenneth Rios is a 56 y.o. year old male who was referred for medication management by their primary care provider, Kenneth RegisterNewlin, Enobong, MD. They presented for a telephone visit. ?  ?They were referred to the pharmacist by a quality report for assistance in managing hypertension.  ? ?Subjective: ? ?Care Team: ?Primary Care Provider: Hoy RegisterNewlin, Enobong, MD ; Next Scheduled Visit: 03/15/22 ?Cardiologist: Revankar; Next Scheduled Visit: ~ January 2024 ? ?Medication Access/Adherence ? ?[facility] Pharmacy:  ?[facility] ?[address], [address] ?[place] [address] ?Phone: [phone] Fax: [phone] ? ? ?Patient reports affordability concerns with their medications: Yes  ?Patient reports access/transportation concerns to their pharmacy: Yes  ?Patient reports adherence concerns with their medications:  Yes  patient reports today he is completely out of his medications. [facility] on [name]. He has no transportation to the pharmacy. He estimates he has been out of his medications for a few days now.  ? ? ?Diabetes: ? ?Current medications: Lantus 50 units daily, metformin 1000 mg twice daily - reports he has neither today ? ?Current glucose readings: significant other reports that he needs a new glucometer, lancets, test strips ? ?Patient denies hypoglycemic s/sx including dizziness, shakiness, sweating. Patient denies hyperglycemic symptoms including polyuria, polydipsia, polyphagia, nocturia, neuropathy, blurred vision. ? ?Hyperlipidemia/ASCVD Risk Reduction ? ?Current lipid lowering medications: rosuvastatin 10 mg daily - reports he does not have  ? ?Antiplatelet regimen: recommended to take aspirin at last cardiology appointment ? ?Heart Failure: ? ?Current medications: prescribed the following, but he reports he does not have: ?ACEi/ARB/ARNI: lisinopril 20 mg daily ?SGLT2i:  none, recommend moving forward ?Beta blocker: carvedilol 6.25 mg twice daily ?Mineralocorticoid Receptor Antagonist: none ?Diuretic regimen: furosemide 40 mg daily  ? ?Additional antihypertensive: amlodipine 10 mg daily  ? ?Current home blood pressure readings: not checking ? ? ?Patient denies volume overload signs or symptoms including shortness of breath, lower extremity edema, increased use of pillows at night ? ?Denies chest pain, headaches, or any concerning symptoms since running out of his medications ? ?Pain: ?Current medications: cyclobenzaprine 10 mg PRN, gabapentin 600 mg twice daily, meloxicam 7.5 mg daily - reports he is out of these medications ? ? ?Allergies/Asthma: ?Current medications: prescribed cetirizine 10 mg daily, fluticasone nasal - reports he does not have  ? ?BPH: ?Current medications: prescribed tamsulosin 0.4 mg daily, does not have  ? ?Health Maintenance ? ?Health Maintenance Due  ?Topic Date Due  ? OPHTHALMOLOGY EXAM  Never done  ? Hepatitis C Screening  Never done  ? TETANUS/TDAP  Never done  ? Zoster Vaccines- Shingrix (1 of 2) Never done  ? FOOT EXAM  01/18/2020  ?  ? ?Objective: ?Lab Results  ?Component Value Date  ? HGBA1C 13.3 (H) 12/03/2021  ? ? ?Lab Results  ?Component Value Date  ? CREATININE 0.98 12/03/2021  ? BUN 9 12/03/2021  ? NA 138 12/03/2021  ? K 4.8 12/03/2021  ? CL 100 12/03/2021  ? CO2 24 12/03/2021  ? ? ?Lab Results  ?Component Value Date  ? CHOL 245 (H) 12/03/2021  ? HDL 49 12/03/2021  ? LDLCALC 171 (H) 12/03/2021  ? TRIG 138 12/03/2021  ? CHOLHDL 5.0 12/03/2021  ? ? ?Medications Reviewed Today   ? ? Reviewed by Kenneth Rios, Kenneth Rios, RPH-CPP (Pharmacist) on 12/08/21 at 1136  Med List Status: <None>  ? ?Medication  Order Taking? Sig Documenting Provider Last Dose Status Informant  ?amLODipine (NORVASC) 10 MG tablet [id number] No Take 1 tablet (10 mg total) by mouth daily.  ?Patient not taking: Reported on 12/08/2021  ? Kenneth Register, MD Not Taking Active   ?carvedilol  (COREG) 6.25 MG tablet [id number] No Take 1 tablet (6.25 mg total) by mouth 2 (two) times daily with a meal.  ?Patient not taking: Reported on 12/08/2021  ? Kenneth Register, MD Not Taking Active   ?cetirizine (ZYRTEC) 10 MG tablet [id number] No Take 1 tablet (10 mg total) by mouth daily.  ?Patient not taking: Reported on 12/08/2021  ? Kenneth Register, MD Not Taking Active   ?cyclobenzaprine (FLEXERIL) 10 MG tablet [id number] No Take 1 tablet (10 mg total) by mouth 2 (two) times daily as needed for muscle spasms.  ?Patient not taking: Reported on 12/08/2021  ? Kenneth Register, MD Not Taking Active   ?fluticasone (FLONASE) 50 MCG/ACT nasal spray [id number] No Place 2 sprays into both nostrils daily.  ?Patient not taking: Reported on 12/08/2021  ? Kenneth Register, MD Not Taking Active   ?furosemide (LASIX) 40 MG tablet [id number] No Take 1 tablet (40 mg total) by mouth daily.  ?Patient not taking: Reported on 12/08/2021  ? Kenneth Register, MD Not Taking Active   ?gabapentin (NEURONTIN) 300 MG capsule [id number] No Take 2 capsules (600 mg total) by mouth 2 (two) times daily.  ?Patient not taking: Reported on 12/08/2021  ? Kenneth Register, MD Not Taking Active   ?insulin glargine (LANTUS) 100 UNIT/ML Solostar Pen [id number] Yes Inject 50 Units into the skin daily. Kenneth Register, MD Taking Active   ?Insulin Pen Needle (BD PEN NEEDLE NANO U/F) 32G X 4 MM MISC [id number] No use to inject insulin once daily  ?Patient not taking: Reported on 12/08/2021  ? Kenneth Register, MD Not Taking Active   ?lisinopril (ZESTRIL) 20 MG tablet [id number] No Take 1 tablet (20 mg total) by mouth daily.  ?Patient not taking: Reported on 12/08/2021  ? Kenneth Register, MD Not Taking Active   ?meloxicam (MOBIC) 7.5 MG tablet [id number] No Take 1 tablet (7.5 mg total) by mouth daily.  ?Patient not taking: Reported on 12/08/2021  ? Kenneth Register, MD Not Taking Active   ?metFORMIN (GLUCOPHAGE) 500 MG tablet [id number] No Take 2 tablets (1,000 mg total) by mouth 2 (two)  times daily with a meal.  ?Patient not taking: Reported on 12/08/2021  ? Kenneth Register, MD Not Taking Active   ?rosuvastatin (CRESTOR) 10 MG tablet [id number] No Take 1 tablet (10 mg total) by mouth daily.  ?Patient not taking: Reported on 12/08/2021  ? Revankar, Aundra Dubin, MD Not Taking Active   ?sildenafil (VIAGRA) 100 MG tablet [id number] No Take 1 tablet (100 mg total) by mouth daily as needed for erectile dysfunction. At least 24 hrs between doses.  Don'Rios take with nitroglycerin  ?Patient not taking: Reported on 12/08/2021  ? Kenneth Register, MD Not Taking Active   ?tamsulosin (FLOMAX) 0.4 MG CAPS capsule [id number]  Take 1 capsule (0.4 mg total) by mouth daily. Kenneth Register, MD  Active   ? ?  ?  ? ?  ? ? ?Assessment/Plan:  ?Care Plan : Medication Management  ?Updates made by Kenneth Hipp, RPH-CPP since 12/08/2021 12:00 AM  ?  ? ?Problem: Hypertension   ?  ? ?Goal: BP <130/80   ?Note:   ?Current Barriers:  ?Unable to independently afford treatment regimen ?Unable to achieve control of blood pressure  ? ?Patient Needs: ?  Improved collaboration with dispensing pharmacy ?Working device to evaluate blood pressure ?Support in medication access ? ?Patient Activities: ?Patient will:  ?- take medications as prescribed as evidenced by patient report and record review ?check blood pressure daily, document, and provide at future appointments ?collaborate with provider on medication access solutions ? ?  ? ?Problem: Diabetes   ?  ? ?Goal: A1c <7%   ?Note:   ?Current Barriers:  ?Unable to independently afford treatment regimen ?Unable to achieve control of diabetes  ? ?Patient Needs: ?Working device to evaluate blood sugar ?Support with medication access ? ?Patient Activities: ?Patient will:  ?- take medications as prescribed as evidenced by patient report and record review ?check glucose twice daily, document, and provide at future appointments ? ?  ? ? ?Diabetes: ?- Currently uncontrolled ?- Collaborated with embedded  pharmacist to place order for glucometer ?- Collaborated with pharmacy to have medications delivered.  ?- Scheduled in office appointment with embedded pharmacist ?- Will work with patient regarding therapy optimi

## 2021-12-08 NOTE — Patient Instructions (Signed)
It was great to speak with you today! ? ?We'll work to have your medications delivered to you tomorrow.  ? ?I'll call you in 2 weeks to follow up and see how your blood sugar and blood pressure readings are doing at home.  ? ?Take care! ? ?Catie Clearance Coots, PharmD ? ?Transportation Benefits ? ?If you are experiencing a medical emergency, please call 911 or report to your local emergency department or urgent care.  ? ?If you have a non-emergency medical problem during routine business hours, please contact your provider's office and ask to speak with a nurse.  ? ?For questions related to your [facility] health plan, please call: [phone] or visit the homepage here: [email or web address] ? ?If you would like to schedule transportation through your [facility] plan, please call the following number at least 2 days in advance of your appointment: [phone] ? For information about your ride after you set it up, call Ride Assist at [phone]. Use this number to activate a Will Call pickup, or if your transportation is late for a scheduled pickup. Use this number, too, if you need to make a change or cancel a previously scheduled reservation. ? If you need transportation services right away, call [phone]. The after-hours call center is staffed 24 hours to handle ride assistance and urgent reservation requests (including discharges) 365 days a year. Urgent trips include sick visits, hospital discharge requests and life-sustaining treatment. ? ? ?  ?

## 2021-12-09 ENCOUNTER — Other Ambulatory Visit: Payer: Self-pay

## 2021-12-09 ENCOUNTER — Other Ambulatory Visit (HOSPITAL_COMMUNITY): Payer: Self-pay

## 2021-12-10 ENCOUNTER — Other Ambulatory Visit (HOSPITAL_COMMUNITY): Payer: Self-pay

## 2021-12-14 ENCOUNTER — Other Ambulatory Visit (HOSPITAL_COMMUNITY): Payer: Self-pay

## 2021-12-16 ENCOUNTER — Other Ambulatory Visit (HOSPITAL_COMMUNITY): Payer: Self-pay

## 2021-12-22 ENCOUNTER — Ambulatory Visit: Payer: Self-pay

## 2021-12-22 ENCOUNTER — Telehealth: Payer: Self-pay | Admitting: Pharmacist

## 2021-12-22 NOTE — Telephone Encounter (Signed)
Attempted to call patient to follow up on receiving medications 2 weeks ago from pharmacy. Unable to leave voicemail. Will try back again later this week.  ? ?Catie Eppie Gibson, PharmD, BCACP ?[facility] ?[phone] ? ?

## 2021-12-23 NOTE — Telephone Encounter (Signed)
Attempted outreach again. Unable to leave voicemail for patient. ?

## 2021-12-30 ENCOUNTER — Telehealth: Payer: Self-pay | Admitting: Cardiology

## 2021-12-30 NOTE — Telephone Encounter (Signed)
Irving Burton from [facility] called with critical reading on patient zio patch.

## 2021-12-30 NOTE — Telephone Encounter (Signed)
Irving Burton with [employer] called to state the pt had 1 episode of lowest AV Block - 2nd Mobitz II (51 bpm) while wearing his monitor.

## 2022-01-08 ENCOUNTER — Ambulatory Visit: Payer: Medicaid Other | Admitting: Pharmacist

## 2022-01-25 ENCOUNTER — Telehealth: Payer: Self-pay | Admitting: Pharmacist

## 2022-01-25 ENCOUNTER — Ambulatory Visit: Payer: Self-pay

## 2022-01-25 NOTE — Telephone Encounter (Signed)
Contacted patient for follow up phone call related to med access and hypertension. Unable to leave voicemail as mail box is full.   Upcoming appointment with PCP in August.   Catie Eppie Gibson, PharmD, [facility] [phone]

## 2022-03-15 ENCOUNTER — Ambulatory Visit: Payer: Medicaid Other | Admitting: Family Medicine

## 2022-04-26 ENCOUNTER — Other Ambulatory Visit: Payer: Self-pay

## 2022-04-26 ENCOUNTER — Encounter: Payer: Self-pay | Admitting: Family Medicine

## 2022-04-26 ENCOUNTER — Ambulatory Visit: Payer: Medicaid Other | Attending: Family Medicine | Admitting: Family Medicine

## 2022-04-26 VITALS — BP 163/109 | HR 86 | Temp 98.1°F | Ht 69.0 in | Wt 175.8 lb

## 2022-04-26 DIAGNOSIS — I110 Hypertensive heart disease with heart failure: Secondary | ICD-10-CM | POA: Diagnosis not present

## 2022-04-26 DIAGNOSIS — Z5941 Food insecurity: Secondary | ICD-10-CM

## 2022-04-26 DIAGNOSIS — Z59811 Housing instability, housed, with risk of homelessness: Secondary | ICD-10-CM | POA: Diagnosis not present

## 2022-04-26 DIAGNOSIS — Z91148 Patient's other noncompliance with medication regimen for other reason: Secondary | ICD-10-CM | POA: Diagnosis not present

## 2022-04-26 DIAGNOSIS — Z125 Encounter for screening for malignant neoplasm of prostate: Secondary | ICD-10-CM

## 2022-04-26 DIAGNOSIS — I5042 Chronic combined systolic (congestive) and diastolic (congestive) heart failure: Secondary | ICD-10-CM

## 2022-04-26 DIAGNOSIS — E1165 Type 2 diabetes mellitus with hyperglycemia: Secondary | ICD-10-CM

## 2022-04-26 DIAGNOSIS — Z794 Long term (current) use of insulin: Secondary | ICD-10-CM | POA: Diagnosis not present

## 2022-04-26 DIAGNOSIS — E1142 Type 2 diabetes mellitus with diabetic polyneuropathy: Secondary | ICD-10-CM

## 2022-04-26 DIAGNOSIS — I152 Hypertension secondary to endocrine disorders: Secondary | ICD-10-CM | POA: Diagnosis not present

## 2022-04-26 DIAGNOSIS — E1159 Type 2 diabetes mellitus with other circulatory complications: Secondary | ICD-10-CM | POA: Diagnosis not present

## 2022-04-26 LAB — POCT GLYCOSYLATED HEMOGLOBIN (HGB A1C): HbA1c, POC (controlled diabetic range): 7.6 % — AB (ref 0.0–7.0)

## 2022-04-26 LAB — GLUCOSE, POCT (MANUAL RESULT ENTRY): POC Glucose: 142 mg/dl — AB (ref 70–99)

## 2022-04-26 MED ORDER — LISINOPRIL 20 MG PO TABS
20.0000 mg | ORAL_TABLET | Freq: Every day | ORAL | 1 refills | Status: DC
  Filled 2022-04-26: qty 90, 90d supply, fill #0
  Filled 2022-07-15 (×2): qty 90, 90d supply, fill #1

## 2022-04-26 MED ORDER — INSULIN GLARGINE 100 UNIT/ML SOLOSTAR PEN
20.0000 [IU] | PEN_INJECTOR | Freq: Every day | SUBCUTANEOUS | 6 refills | Status: DC
  Filled 2022-04-26: qty 18, 90d supply, fill #0
  Filled 2022-07-15 (×2): qty 18, 90d supply, fill #1
  Filled 2022-11-19 (×2): qty 18, 90d supply, fill #2
  Filled 2023-03-07: qty 18, 90d supply, fill #3

## 2022-04-26 MED ORDER — ACCU-CHEK SOFTCLIX LANCETS MISC
2 refills | Status: DC
  Filled 2022-04-26: qty 100, fill #0
  Filled 2022-04-26: qty 100, 34d supply, fill #0
  Filled 2022-11-19 (×2): qty 100, 34d supply, fill #1
  Filled 2022-12-20: qty 100, 34d supply, fill #2

## 2022-04-26 MED ORDER — ACCU-CHEK GUIDE W/DEVICE KIT
PACK | 0 refills | Status: AC
  Filled 2022-04-26: qty 1, 90d supply, fill #0
  Filled 2023-03-16: qty 1, 30d supply, fill #0
  Filled 2023-03-16: qty 1, 1d supply, fill #0
  Filled 2023-03-16: qty 1, fill #0
  Filled 2023-04-14: qty 1, 30d supply, fill #0

## 2022-04-26 MED ORDER — ACCU-CHEK GUIDE VI STRP
1.0000 | ORAL_STRIP | Freq: Three times a day (TID) | 2 refills | Status: DC
  Filled 2022-04-26: qty 100, 34d supply, fill #0
  Filled 2022-11-19 (×2): qty 100, 34d supply, fill #1
  Filled 2022-12-20: qty 100, 34d supply, fill #2

## 2022-04-26 MED ORDER — TAMSULOSIN HCL 0.4 MG PO CAPS
0.4000 mg | ORAL_CAPSULE | Freq: Every day | ORAL | 1 refills | Status: DC
  Filled 2022-04-26: qty 90, 90d supply, fill #0
  Filled 2022-07-15 (×2): qty 90, 90d supply, fill #1

## 2022-04-26 MED ORDER — GABAPENTIN 300 MG PO CAPS
600.0000 mg | ORAL_CAPSULE | Freq: Two times a day (BID) | ORAL | 3 refills | Status: DC
  Filled 2022-04-26: qty 120, 30d supply, fill #0
  Filled 2022-07-15 (×2): qty 120, 30d supply, fill #1
  Filled 2022-09-20 – 2022-09-24 (×2): qty 120, 30d supply, fill #2

## 2022-04-26 MED ORDER — FUROSEMIDE 40 MG PO TABS
40.0000 mg | ORAL_TABLET | Freq: Every day | ORAL | 1 refills | Status: DC
  Filled 2022-04-26: qty 90, 90d supply, fill #0
  Filled 2022-07-15 (×2): qty 90, 90d supply, fill #1

## 2022-04-26 MED ORDER — METFORMIN HCL 500 MG PO TABS
1000.0000 mg | ORAL_TABLET | Freq: Two times a day (BID) | ORAL | 1 refills | Status: DC
  Filled 2022-04-26: qty 360, 90d supply, fill #0
  Filled 2022-07-15 (×2): qty 360, 90d supply, fill #1

## 2022-04-26 MED ORDER — AMLODIPINE BESYLATE 10 MG PO TABS
10.0000 mg | ORAL_TABLET | Freq: Every day | ORAL | 1 refills | Status: DC
  Filled 2022-04-26: qty 90, 90d supply, fill #0
  Filled 2022-09-20 – 2022-09-24 (×2): qty 90, 90d supply, fill #1

## 2022-04-26 MED ORDER — CARVEDILOL 6.25 MG PO TABS
6.2500 mg | ORAL_TABLET | Freq: Two times a day (BID) | ORAL | 1 refills | Status: DC
  Filled 2022-04-26: qty 180, 90d supply, fill #0
  Filled 2022-07-15 (×2): qty 180, 90d supply, fill #1

## 2022-04-26 NOTE — Progress Notes (Unsigned)
Needs all medications refilled.

## 2022-04-26 NOTE — Patient Instructions (Signed)
Neuropathic Pain Neuropathic pain is pain caused by damage to the nerves that are responsible for certain sensations in your body (sensory nerves). Neuropathic pain can make you more sensitive to pain. Even a minor sensation can feel very painful. This is usually a long-term (chronic) condition that can be difficult to treat. The type of pain differs from person to person. It may: Start suddenly (acute), or it may develop slowly and become chronic. Come and go as damaged nerves heal, or it may stay at the same level for years. Cause emotional distress, loss of sleep, and a lower quality of life. What are the causes? The most common cause of this condition is diabetes. Many other diseases and conditions can also cause neuropathic pain. Causes of neuropathic pain can be classified as: Toxic. This is caused by medicines and chemicals. The most common causes of toxic neuropathic pain is damage from medicines that kill cancer cells (chemotherapy) or alcohol abuse. Metabolic. This can be caused by: Diabetes. Lack of vitamins like B12. Traumatic. Any injury that cuts, crushes, or stretches a nerve can cause damage and pain. Compression-related. If a sensory nerve gets trapped or compressed for a long period of time, the blood supply to the nerve can be cut off. Vascular. Many blood vessel diseases can cause neuropathic pain by decreasing blood supply and oxygen to nerves. Autoimmune. This type of pain results from diseases in which the body's defense system (immune system) mistakenly attacks sensory nerves. Examples of autoimmune diseases that can cause neuropathic pain include lupus and multiple sclerosis. Infectious. Many types of viral infections can damage sensory nerves and cause pain. Shingles infection is a common cause of this type of pain. Inherited. Neuropathic pain can be a symptom of many diseases that are passed down through families (genetic). What increases the risk? You are more likely to  develop this condition if: You have diabetes. You smoke. You drink too much alcohol. You are taking certain medicines, including chemotherapy or medicines that treat immune system disorders. What are the signs or symptoms? The main symptom is pain. Neuropathic pain is often described as: Burning. Shock-like. Stinging. Hot or cold. Itching. How is this diagnosed? No single test can diagnose neuropathic pain. It is diagnosed based on: A physical exam and your symptoms. Your health care provider will ask you about your pain. You may be asked to use a pain scale to describe how bad your pain is. Tests. These may be done to see if you have a cause and location of any nerve damage. They include: Nerve conduction studies and electromyography to test how well nerve signals travel through your nerves and muscles (electrodiagnostic testing). Skin biopsy to evaluate for small fiber neuropathy. Imaging studies, such as: X-rays. CT scan. MRI. How is this treated? Treatment for neuropathic pain may change over time. You may need to try different treatment options or a combination of treatments. Some options include: Treating the underlying cause of the neuropathy, such as diabetes, kidney disease, or vitamin deficiencies. Stopping medicines that can cause neuropathy, such as chemotherapy. Medicine to relieve pain. Medicines may include: Prescription or over-the-counter pain medicine. Anti-seizure medicine. Antidepressant medicines. Pain-relieving patches or creams that are applied to painful areas of skin. A medicine to numb the area (local anesthetic), which can be injected as a nerve block. Transcutaneous nerve stimulation. This uses electrical currents to block painful nerve signals. The treatment is painless. Alternative treatments, such as: Acupuncture. Meditation. Massage. Occupational or physical therapy. Pain management programs. Counseling. Follow   these instructions at  home: Medicines  Take over-the-counter and prescription medicines only as told by your health care provider. Ask your health care provider if the medicine prescribed to you: Requires you to avoid driving or using machinery. Can cause constipation. You may need to take these actions to prevent or treat constipation: Drink enough fluid to keep your urine pale yellow. Take over-the-counter or prescription medicines. Eat foods that are high in fiber, such as beans, whole grains, and fresh fruits and vegetables. Limit foods that are high in fat and processed sugars, such as fried or sweet foods. Lifestyle  Have a good support system at home. Consider joining a chronic pain support group. Do not use any products that contain nicotine or tobacco. These products include cigarettes, chewing tobacco, and vaping devices, such as e-cigarettes. If you need help quitting, ask your health care provider. Do not drink alcohol. General instructions Learn as much as you can about your condition. Work closely with all your health care providers to find the treatment plan that works best for you. Ask your health care provider what activities are safe for you. Keep all follow-up visits. This is important. Contact a health care provider if: Your pain treatments are not working. You are having side effects from your medicines. You are struggling with tiredness (fatigue), mood changes, depression, or anxiety. Get help right away if: You have thoughts of hurting yourself. Get help right away if you feel like you may hurt yourself or others, or have thoughts about taking your own life. Go to your nearest emergency room or: Call 911. Call the National Suicide Prevention Lifeline at 1-800-273-8255 or 988. This is open 24 hours a day. Text the Crisis Text Line at 741741. Summary Neuropathic pain is pain caused by damage to the nerves that are responsible for certain sensations in your body (sensory  nerves). Neuropathic pain may come and go as damaged nerves heal, or it may stay at the same level for years. Neuropathic pain is usually a long-term condition that can be difficult to treat. Consider joining a chronic pain support group. This information is not intended to replace advice given to you by your health care provider. Make sure you discuss any questions you have with your health care provider. Document Revised: 03/23/2021 Document Reviewed: 03/23/2021 Elsevier Patient Education  2023 Elsevier Inc.  

## 2022-04-26 NOTE — Progress Notes (Unsigned)
Subjective:  Patient ID: Kenneth Rios, male    DOB: 1966-03-07  Age: 56 y.o. MRN: [id number]  CC: Diabetes   HPI Kenneth Rios is a 56 y.o. 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  Interval History:  Last seen by his cardiologist in 11/2021 with plans to follow-up in 9 months.  He has no dyspnea or chest pains.  He has food insecurity, challenges with transportation, is unable to afford his medication and has not had medications in 3 months. He had a neighbor bring him into the appointment today and he has to pay him $3. He has had lived with different people due to his difficulty with housing.  A1c is 7.6 down from 13.3 surprisingly despite not having insulin for the last 56 months.  He endorses presence of neuropathy and gabapentin appears on his med list which she has not been taking due to financial constraints. Blood pressure is elevated due to nonadherence with antihypertensive Past Medical History:  Diagnosis Date   (HFpEF) heart failure with preserved ejection fraction (HCC) 03/30/2018   Accelerated hypertension 06/05/2018   Acute diverticulitis 08/29/2017   Acute viral bronchitis 08/24/2017   Adhesive capsulitis of left shoulder 08/27/2021   Angina pectoris (HCC) 03/29/2018   Anxiety    Anxiety and depression 02/06/2018   Asthma with status asthmaticus    Chest tightness 03/29/2018   CHF (congestive heart failure) (HCC)    "fluttering"   Depression    Diabetes mellitus due to underlying condition with unspecified complications (HCC) 01/09/2020   Diabetes mellitus without complication (HCC)    Diverticulitis    Diverticulitis large intestine 05/31/2018   GERD  (gastroesophageal reflux disease)    Hyperlipidemia    Hypertension    Hypertension associated with diabetes (HCC) 08/24/2017   Hypertensive heart disease with chronic combined systolic and diastolic congestive heart failure (HCC) 08/24/2017   Hypertensive urgency 08/24/2017   Microcytic anemia 12/19/2018   Mixed dyslipidemia 01/09/2020   Palpitations 03/29/2018   Peritonitis (HCC) 09/02/2017   Pneumonia 12/16/2018   Polysubstance abuse (HCC) 12/19/2018   Prolonged Q-T interval on ECG 02/06/2018   S/P colostomy (HCC) 09/16/2017   Type 2 diabetes mellitus with hyperglycemia, with long-term current use of insulin (HCC) 08/27/2021   Unstable angina (HCC) 03/29/2018    Past Surgical History:  Procedure Laterality Date   CARDIAC CATHETERIZATION  03/30/2018   COLON RESECTION N/A 09/05/2017   Procedure: HARTMAN'S COLECTOMY AND COLOSTOMY;  Surgeon: Rodman Pickle, MD;  Location: [facility] OR;  Service: General;  Laterality: N/A;   COLON SURGERY     COLOSTOMY TAKEDOWN N/A 05/31/2018   Procedure: LAPAROSCOPIC COLOSTOMY REVERSAL COLORECTAL ANASTOMOSIS ERAS PATHWAY;  Surgeon: Sheliah Hatch De Blanch, MD;  Location: [facility] OR;  Service: General;  Laterality: N/A;   INTRAVASCULAR PRESSURE WIRE/FFR STUDY N/A 03/30/2018   Procedure: INTRAVASCULAR PRESSURE WIRE/FFR STUDY;  Surgeon: Yvonne Kendall, MD;  Location: [facility];  Service: Cardiovascular;  Laterality: N/A;   LEFT HEART CATH AND CORONARY ANGIOGRAPHY N/A 03/30/2018   Procedure: LEFT HEART CATH AND CORONARY ANGIOGRAPHY;  Surgeon: Yvonne Kendall, MD;  Location: [facility];  Service: Cardiovascular;  Laterality: N/A;    Family History  Problem Relation Age of Onset  Heart disease Mother    Heart disease Sister    Diabetes Maternal Aunt    Heart disease Maternal Aunt    Diabetes Maternal Uncle    Sudden Cardiac Death Neg Hx     Social History   Socioeconomic History   Marital status: Single    Spouse name: Not on file   Number of  children: 3   Years of education: Not on file   Highest education level: Not on file  Occupational History   Occupation: Disabled  Tobacco Use   Smoking status: Former   Smokeless tobacco: Never  Scientific laboratory technician Use: Never used  Substance and Sexual Activity   Alcohol use: Yes    Alcohol/week: 6.0 standard drinks of alcohol    Types: 6 Cans of beer per week    Comment: on the weekends   Drug use: No   Sexual activity: Yes    Partners: Female  Other Topics Concern   Not on file  Social History Narrative   Not on file   Social Determinants of Health   Financial Resource Strain: Not on file  Food Insecurity: Not on file  Transportation Needs: Not on file  Physical Activity: Not on file  Stress: Not on file  Social Connections: Not on file    No Known Allergies  Outpatient Medications Prior to Visit  Medication Sig Dispense Refill   cetirizine (ZYRTEC) 10 MG tablet Take 1 tablet (10 mg total) by mouth daily. 30 tablet 2   cyclobenzaprine (FLEXERIL) 10 MG tablet Take 1 tablet (10 mg total) by mouth 2 (two) times daily as needed for muscle spasms. 60 tablet 1   fluticasone (FLONASE) 50 MCG/ACT nasal spray Place 2 sprays into both nostrils daily. 16 g 0   Insulin Pen Needle (BD PEN NEEDLE NANO U/F) 32G X 4 MM MISC use to inject insulin once daily 100 each 11   meloxicam (MOBIC) 7.5 MG tablet Take 1 tablet (7.5 mg total) by mouth daily. 30 tablet 2   rosuvastatin (CRESTOR) 10 MG tablet Take 1 tablet (10 mg total) by mouth daily. 90 tablet 3   sildenafil (VIAGRA) 100 MG tablet Take 1 tablet (100 mg total) by mouth daily as needed for erectile dysfunction. At least 24 hrs between doses.  Don't take with nitroglycerin 10 tablet 1   Accu-Chek Softclix Lancets lancets Use to check blood sugar three times daily. E11.65 100 each 2   amLODipine (NORVASC) 10 MG tablet Take 1 tablet (10 mg total) by mouth daily. 30 tablet 2   Blood Glucose Monitoring Suppl (ACCU-CHEK GUIDE) w/Device  KIT Use to check blood sugar three times daily. E11.65 1 kit 0   carvedilol (COREG) 6.25 MG tablet Take 1 tablet (6.25 mg total) by mouth 2 (two) times daily with a meal. 60 tablet 6   furosemide (LASIX) 40 MG tablet Take 1 tablet (40 mg total) by mouth daily. 30 tablet 6   gabapentin (NEURONTIN) 300 MG capsule Take 2 capsules (600 mg total) by mouth 2 (two) times daily. 120 capsule 3   glucose blood (ACCU-CHEK GUIDE) test strip Use to check blood sugar three times daily. E11.65 100 each 2   insulin glargine (LANTUS) 100 UNIT/ML Solostar Pen Inject 50 Units into the skin daily. 30 mL 6   lisinopril (ZESTRIL) 20 MG tablet Take 1 tablet (20 mg total) by mouth daily. 30 tablet 6   metFORMIN (GLUCOPHAGE) 500 MG tablet Take 2 tablets (1,000 mg total) by mouth  2 (two) times daily with a meal. 120 tablet 6   tamsulosin (FLOMAX) 0.4 MG CAPS capsule Take 1 capsule (0.4 mg total) by mouth daily. 30 capsule 3   No facility-administered medications prior to visit.     ROS Review of Systems  Constitutional:  Negative for activity change and appetite change.  HENT:  Negative for sinus pressure and sore throat.   Respiratory:  Negative for chest tightness, shortness of breath and wheezing.   Cardiovascular:  Negative for chest pain and palpitations.  Gastrointestinal:  Negative for abdominal distention, abdominal pain and constipation.  Genitourinary: Negative.   Musculoskeletal: Negative.   Neurological:  Positive for numbness.  Psychiatric/Behavioral:  Negative for behavioral problems and dysphoric mood.     Objective:  BP (!) 163/109   Pulse 86   Temp 98.1 F (36.7 C) (Oral)   Ht $R'5\' 9"'Nb$  (1.753 m)   Wt 175 lb 12.8 oz (79.7 kg)   SpO2 100%   BMI 25.96 kg/m      04/26/2022    3:02 PM 12/03/2021    7:58 AM 08/26/2021    3:24 PM  BP/Weight  Systolic BP 017 793 903  Diastolic BP 009 74 233  Wt. (Lbs) 175.8 176 175.6  BMI 25.96 kg/m2 25.99 kg/m2 25.93 kg/m2      Physical  Exam Constitutional:      Appearance: He is well-developed.  Cardiovascular:     Rate and Rhythm: Normal rate.     Heart sounds: Normal heart sounds. No murmur heard. Pulmonary:     Effort: Pulmonary effort is normal.     Breath sounds: Normal breath sounds. No wheezing or rales.  Chest:     Chest wall: No tenderness.  Abdominal:     General: Bowel sounds are normal. There is no distension.     Palpations: Abdomen is soft. There is no mass.     Tenderness: There is no abdominal tenderness.  Musculoskeletal:        General: Normal range of motion.     Right lower leg: No edema.     Left lower leg: No edema.  Neurological:     Mental Status: He is alert and oriented to person, place, and time.  Psychiatric:        Mood and Affect: Mood normal.        Latest Ref Rng & Units 04/26/2022    3:58 PM 12/03/2021    8:58 AM 05/14/2021   12:15 PM  CMP  Glucose 70 - 99 mg/dL 128  353  537   BUN 6 - 24 mg/dL $Remove'17  9  12   'KRTpKEd$ Creatinine 0.76 - 1.27 mg/dL 1.10  0.98  1.05   Sodium 134 - 144 mmol/L 137  138  133   Potassium 3.5 - 5.2 mmol/L 4.9  4.8  5.0   Chloride 96 - 106 mmol/L 100  100  94   CO2 20 - 29 mmol/L $RemoveB'23  24  24   'SFSbwxok$ Calcium 8.7 - 10.2 mg/dL 9.6  9.9  9.8   Total Protein 6.0 - 8.5 g/dL  6.7  7.0   Total Bilirubin 0.0 - 1.2 mg/dL  0.6  0.3   Alkaline Phos 44 - 121 IU/L  65  92   AST 0 - 40 IU/L  12  10   ALT 0 - 44 IU/L  11  15     Lipid Panel     Component Value Date/Time   CHOL 245 (H) 12/03/2021 0076  TRIG 138 12/03/2021 0858   HDL 49 12/03/2021 0858   CHOLHDL 5.0 12/03/2021 0858   CHOLHDL 6.1 03/31/2018 0227   VLDL UNABLE TO CALCULATE IF TRIGLYCERIDE OVER 400 mg/dL 03/31/2018 0227   LDLCALC 171 (H) 12/03/2021 0858    CBC    Component Value Date/Time   WBC 4.6 12/03/2021 0858   WBC 5.8 06/05/2018 0820   RBC 4.79 12/03/2021 0858   RBC 3.35 (L) 06/05/2018 0820   HGB 14.1 12/03/2021 0858   HCT 40.0 12/03/2021 0858   PLT 278 12/03/2021 0858   MCV 84 12/03/2021  0858   MCV 82 03/14/2014 0400   MCH 29.4 12/03/2021 0858   MCH 27.5 06/05/2018 0820   MCHC 35.3 12/03/2021 0858   MCHC 33.8 06/05/2018 0820   RDW 13.1 12/03/2021 0858   RDW 13.5 03/14/2014 0400   LYMPHSABS 2.4 12/03/2021 0858   LYMPHSABS 0.9 (L) 03/14/2014 0400   MONOABS 0.4 06/05/2018 0820   MONOABS 0.1 (L) 03/14/2014 0400   EOSABS 0.2 12/03/2021 0858   EOSABS 0.0 03/14/2014 0400   BASOSABS 0.0 12/03/2021 0858   BASOSABS 0.0 03/14/2014 0400    Lab Results  Component Value Date   HGBA1C 7.6 (A) 04/26/2022    Assessment & Plan:  1. Type 2 diabetes mellitus with hyperglycemia, with long-term current use of insulin (HCC) Suboptimally controlled with A1c of 7.6; goal is less than 7.0 His chart reveals he should be on 50 units of Lantus and I am concerned that he might have hypoglycemia with this A1c.  Reduce Lantus to 20 units nightly Prescription for glucometer sent to the pharmacy If blood sugars trend up we will increase his Lantus dose Counseled on Diabetic diet, my plate method, 009 minutes of moderate intensity exercise/week Blood sugar logs with fasting goals of 80-120 mg/dl, random of less than 180 and in the event of sugars less than 60 mg/dl or greater than 400 mg/dl encouraged to notify the clinic. Advised on the need for annual eye exams, annual foot exams, Pneumonia vaccine. - POCT glucose (manual entry) - POCT glycosylated hemoglobin (Hb F8H) - Basic Metabolic Panel - insulin glargine (LANTUS) 100 UNIT/ML Solostar Pen; Inject 20 Units into the skin at bedtime.  Dispense: 30 mL; Refill: 6 - metFORMIN (GLUCOPHAGE) 500 MG tablet; Take 2 tablets (1,000 mg total) by mouth 2 (two) times daily with a meal.  Dispense: 360 tablet; Refill: 1 - Blood Glucose Monitoring Suppl (ACCU-CHEK GUIDE) w/Device KIT; Use to check blood sugar three times daily. E11.65  Dispense: 1 kit; Refill: 0 - Accu-Chek Softclix Lancets lancets; Use to check blood sugar three times daily. E11.65   Dispense: 100 each; Refill: 2 - glucose blood (ACCU-CHEK GUIDE) test strip; Use to check blood sugar three times daily.  Dispense: 100 each; Refill: 2  2. Hypertension associated with diabetes ([place]) Uncontrolled due to medication nonadherence Refilled medication Case manager called into the assist with SDOH challenges. Counseled on blood pressure goal of less than 130/80, low-sodium, DASH diet, medication compliance, 150 minutes of moderate intensity exercise per week. Discussed medication compliance, adverse effects. - amLODipine (NORVASC) 10 MG tablet; Take 1 tablet (10 mg total) by mouth daily.  Dispense: 90 tablet; Refill: 1 - carvedilol (COREG) 6.25 MG tablet; Take 1 tablet (6.25 mg total) by mouth 2 (two) times daily with a meal.  Dispense: 180 tablet; Refill: 1 - lisinopril (ZESTRIL) 20 MG tablet; Take 1 tablet (20 mg total) by mouth daily.  Dispense: 90 tablet; Refill: 1  3.  Hypertensive heart disease with chronic combined systolic and diastolic congestive heart failure (HCC) EF 60 to 65% Euvolemic Unfortunately his medication nonadherence puts him at risk of exacerbation Continue SGLT2i, beta-blocker Currently under the care of cardiology - furosemide (LASIX) 40 MG tablet; Take 1 tablet (40 mg total) by mouth daily.  Dispense: 90 tablet; Refill: 1  4. Diabetic polyneuropathy associated with type 2 diabetes mellitus ([place]) Uncontrolled due to medication nonadherence Refill gabapentin - gabapentin (NEURONTIN) 300 MG capsule; Take 2 capsules (600 mg total) by mouth 2 (two) times daily.  Dispense: 120 capsule; Refill: 3  5.  Food insecurity Case manager met with him to discuss resources for free meals We provided him with a plate of food in the clinic  6.  Nonadherence with medication Due to financial constraints Case manager discussed with him that he can speak with the pharmacy and charge his account so he can obtain his medications today  7.  Housing instability, house with  risk of homelessness I have spoken to the case manager who refers she has met with him on different occasions and he has not followed through with recommendations  Meds ordered this encounter  Medications   amLODipine (NORVASC) 10 MG tablet    Sig: Take 1 tablet (10 mg total) by mouth daily.    Dispense:  90 tablet    Refill:  1   carvedilol (COREG) 6.25 MG tablet    Sig: Take 1 tablet (6.25 mg total) by mouth 2 (two) times daily with a meal.    Dispense:  180 tablet    Refill:  1   furosemide (LASIX) 40 MG tablet    Sig: Take 1 tablet (40 mg total) by mouth daily.    Dispense:  90 tablet    Refill:  1   gabapentin (NEURONTIN) 300 MG capsule    Sig: Take 2 capsules (600 mg total) by mouth 2 (two) times daily.    Dispense:  120 capsule    Refill:  3    Discontinue previous dose   insulin glargine (LANTUS) 100 UNIT/ML Solostar Pen    Sig: Inject 20 Units into the skin at bedtime.    Dispense:  30 mL    Refill:  6   lisinopril (ZESTRIL) 20 MG tablet    Sig: Take 1 tablet (20 mg total) by mouth daily.    Dispense:  90 tablet    Refill:  1   metFORMIN (GLUCOPHAGE) 500 MG tablet    Sig: Take 2 tablets (1,000 mg total) by mouth 2 (two) times daily with a meal.    Dispense:  360 tablet    Refill:  1   tamsulosin (FLOMAX) 0.4 MG CAPS capsule    Sig: Take 1 capsule (0.4 mg total) by mouth daily.    Dispense:  90 capsule    Refill:  1   Blood Glucose Monitoring Suppl (ACCU-CHEK GUIDE) w/Device KIT    Sig: Use to check blood sugar three times daily. E11.65    Dispense:  1 kit    Refill:  0   Accu-Chek Softclix Lancets lancets    Sig: Use to check blood sugar three times daily. E11.65    Dispense:  100 each    Refill:  2   glucose blood (ACCU-CHEK GUIDE) test strip    Sig: Use to check blood sugar three times daily.    Dispense:  100 each    Refill:  2    Follow-up: Return in about 3 months (  around 07/26/2022) for Chronic medical conditions.       Charlott Rakes, MD,  FAAFP. [facility] [place], [place]   04/27/2022, 1:03 PM

## 2022-04-27 ENCOUNTER — Other Ambulatory Visit: Payer: Self-pay | Admitting: Family Medicine

## 2022-04-27 ENCOUNTER — Encounter: Payer: Self-pay | Admitting: Family Medicine

## 2022-04-27 ENCOUNTER — Other Ambulatory Visit (HOSPITAL_COMMUNITY): Payer: Self-pay

## 2022-04-27 DIAGNOSIS — M7502 Adhesive capsulitis of left shoulder: Secondary | ICD-10-CM

## 2022-04-27 LAB — BASIC METABOLIC PANEL
BUN/Creatinine Ratio: 15 (ref 9–20)
BUN: 17 mg/dL (ref 6–24)
CO2: 23 mmol/L (ref 20–29)
Calcium: 9.6 mg/dL (ref 8.7–10.2)
Chloride: 100 mmol/L (ref 96–106)
Creatinine, Ser: 1.1 mg/dL (ref 0.76–1.27)
Glucose: 128 mg/dL — ABNORMAL HIGH (ref 70–99)
Potassium: 4.9 mmol/L (ref 3.5–5.2)
Sodium: 137 mmol/L (ref 134–144)
eGFR: 79 mL/min/{1.73_m2} (ref >59)

## 2022-04-27 MED ORDER — CYCLOBENZAPRINE HCL 10 MG PO TABS
10.0000 mg | ORAL_TABLET | Freq: Two times a day (BID) | ORAL | 1 refills | Status: DC | PRN
  Filled 2022-04-27 – 2022-07-21 (×4): qty 60, 30d supply, fill #0
  Filled 2022-09-20 – 2022-09-24 (×2): qty 60, 30d supply, fill #1

## 2022-04-28 ENCOUNTER — Other Ambulatory Visit (HOSPITAL_COMMUNITY): Payer: Self-pay

## 2022-04-30 ENCOUNTER — Other Ambulatory Visit (HOSPITAL_COMMUNITY): Payer: Self-pay

## 2022-05-08 ENCOUNTER — Other Ambulatory Visit (HOSPITAL_COMMUNITY): Payer: Self-pay

## 2022-07-15 ENCOUNTER — Encounter (HOSPITAL_COMMUNITY): Payer: Self-pay

## 2022-07-15 ENCOUNTER — Other Ambulatory Visit: Payer: Self-pay

## 2022-07-15 ENCOUNTER — Other Ambulatory Visit (HOSPITAL_COMMUNITY): Payer: Self-pay

## 2022-07-19 ENCOUNTER — Other Ambulatory Visit (HOSPITAL_COMMUNITY): Payer: Self-pay

## 2022-07-19 ENCOUNTER — Other Ambulatory Visit: Payer: Self-pay

## 2022-07-19 ENCOUNTER — Other Ambulatory Visit (HOSPITAL_BASED_OUTPATIENT_CLINIC_OR_DEPARTMENT_OTHER): Payer: Self-pay

## 2022-07-20 ENCOUNTER — Encounter (HOSPITAL_COMMUNITY): Payer: Self-pay | Admitting: Pharmacist

## 2022-07-20 ENCOUNTER — Other Ambulatory Visit (HOSPITAL_COMMUNITY): Payer: Self-pay

## 2022-07-21 ENCOUNTER — Other Ambulatory Visit (HOSPITAL_COMMUNITY): Payer: Self-pay

## 2022-07-22 ENCOUNTER — Other Ambulatory Visit (HOSPITAL_COMMUNITY): Payer: Self-pay

## 2022-07-24 ENCOUNTER — Other Ambulatory Visit (HOSPITAL_COMMUNITY): Payer: Self-pay

## 2022-08-04 ENCOUNTER — Ambulatory Visit: Payer: Medicaid Other | Admitting: Family Medicine

## 2022-08-20 ENCOUNTER — Emergency Department (HOSPITAL_COMMUNITY): Payer: Medicaid Other

## 2022-08-20 ENCOUNTER — Other Ambulatory Visit: Payer: Self-pay

## 2022-08-20 ENCOUNTER — Emergency Department (HOSPITAL_COMMUNITY)
Admission: EM | Admit: 2022-08-20 | Discharge: 2022-08-20 | Discharge status: Against Medical Advice | Payer: Medicaid Other | Attending: Emergency Medicine | Admitting: Emergency Medicine

## 2022-08-20 DIAGNOSIS — E119 Type 2 diabetes mellitus without complications: Secondary | ICD-10-CM | POA: Insufficient documentation

## 2022-08-20 DIAGNOSIS — I509 Heart failure, unspecified: Secondary | ICD-10-CM | POA: Insufficient documentation

## 2022-08-20 DIAGNOSIS — R0789 Other chest pain: Secondary | ICD-10-CM | POA: Insufficient documentation

## 2022-08-20 DIAGNOSIS — R079 Chest pain, unspecified: Secondary | ICD-10-CM | POA: Diagnosis not present

## 2022-08-20 DIAGNOSIS — R42 Dizziness and giddiness: Secondary | ICD-10-CM | POA: Insufficient documentation

## 2022-08-20 DIAGNOSIS — Z5321 Procedure and treatment not carried out due to patient leaving prior to being seen by health care provider: Secondary | ICD-10-CM | POA: Insufficient documentation

## 2022-08-20 DIAGNOSIS — R404 Transient alteration of awareness: Secondary | ICD-10-CM | POA: Diagnosis not present

## 2022-08-20 DIAGNOSIS — R4182 Altered mental status, unspecified: Secondary | ICD-10-CM | POA: Diagnosis not present

## 2022-08-20 DIAGNOSIS — R202 Paresthesia of skin: Secondary | ICD-10-CM | POA: Insufficient documentation

## 2022-08-20 DIAGNOSIS — I110 Hypertensive heart disease with heart failure: Secondary | ICD-10-CM | POA: Insufficient documentation

## 2022-08-20 DIAGNOSIS — R55 Syncope and collapse: Secondary | ICD-10-CM | POA: Diagnosis not present

## 2022-08-20 DIAGNOSIS — R569 Unspecified convulsions: Secondary | ICD-10-CM | POA: Diagnosis not present

## 2022-08-20 DIAGNOSIS — R0689 Other abnormalities of breathing: Secondary | ICD-10-CM | POA: Diagnosis not present

## 2022-08-20 DIAGNOSIS — R0602 Shortness of breath: Secondary | ICD-10-CM | POA: Diagnosis not present

## 2022-08-20 LAB — COMPREHENSIVE METABOLIC PANEL
ALT: 11 U/L (ref 0–44)
AST: 20 U/L (ref 15–41)
Albumin: 4.1 g/dL (ref 3.5–5.0)
Alkaline Phosphatase: 52 U/L (ref 38–126)
Anion gap: 11 (ref 5–15)
BUN: 18 mg/dL (ref 6–20)
CO2: 24 mmol/L (ref 22–32)
Calcium: 9.3 mg/dL (ref 8.9–10.3)
Chloride: 101 mmol/L (ref 98–111)
Creatinine, Ser: 1.81 mg/dL — ABNORMAL HIGH (ref 0.61–1.24)
GFR, Estimated: 43 mL/min — ABNORMAL LOW (ref >60)
Glucose, Bld: 106 mg/dL — ABNORMAL HIGH (ref 70–99)
Potassium: 3.8 mmol/L (ref 3.5–5.1)
Sodium: 136 mmol/L (ref 135–145)
Total Bilirubin: 1 mg/dL (ref 0.3–1.2)
Total Protein: 7.2 g/dL (ref 6.5–8.1)

## 2022-08-20 LAB — CBC WITH DIFFERENTIAL/PLATELET
Abs Immature Granulocytes: 0.04 10*3/uL (ref 0.00–0.07)
Basophils Absolute: 0 10*3/uL (ref 0.0–0.1)
Basophils Relative: 1 %
Eosinophils Absolute: 0.2 10*3/uL (ref 0.0–0.5)
Eosinophils Relative: 4 %
HCT: 42.5 % (ref 39.0–52.0)
Hemoglobin: 14.9 g/dL (ref 13.0–17.0)
Immature Granulocytes: 1 %
Lymphocytes Relative: 36 %
Lymphs Abs: 2.1 10*3/uL (ref 0.7–4.0)
MCH: 29.5 pg (ref 26.0–34.0)
MCHC: 35.1 g/dL (ref 30.0–36.0)
MCV: 84.2 fL (ref 80.0–100.0)
Monocytes Absolute: 0.4 10*3/uL (ref 0.1–1.0)
Monocytes Relative: 7 %
Neutro Abs: 3 10*3/uL (ref 1.7–7.7)
Neutrophils Relative %: 51 %
Platelets: 361 10*3/uL (ref 150–400)
RBC: 5.05 MIL/uL (ref 4.22–5.81)
RDW: 12.1 % (ref 11.5–15.5)
WBC: 5.8 10*3/uL (ref 4.0–10.5)
nRBC: 0 % (ref 0.0–0.2)

## 2022-08-20 LAB — D-DIMER, QUANTITATIVE: D-Dimer, Quant: <0.27 ug/mL-FEU (ref 0.00–0.50)

## 2022-08-20 LAB — ETHANOL: Alcohol, Ethyl (B): <10 mg/dL (ref <10)

## 2022-08-20 LAB — TROPONIN I (HIGH SENSITIVITY): Troponin I (High Sensitivity): 20 ng/L — ABNORMAL HIGH (ref <18)

## 2022-08-20 LAB — BRAIN NATRIURETIC PEPTIDE: B Natriuretic Peptide: 167.3 pg/mL — ABNORMAL HIGH (ref 0.0–100.0)

## 2022-08-20 MED ORDER — LACTATED RINGERS IV BOLUS
500.0000 mL | Freq: Once | INTRAVENOUS | Status: AC
  Administered 2022-08-20: 500 mL via INTRAVENOUS

## 2022-08-20 MED ORDER — LACTATED RINGERS IV BOLUS: 1000.0000 mL | Freq: Once | INTRAVENOUS | Status: DC

## 2022-08-20 NOTE — ED Provider Notes (Signed)
[facility] EMERGENCY DEPARTMENT Provider Note   CSN: [id number] Arrival date & time: 08/20/22  1446     History {Add pertinent medical, surgical, social history, OB history to HPI:1} Chief Complaint  Patient presents with   Altered Mental Status   Seizures    Kenneth Rios is a 57 y.o. male.   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  Patient was helping some people move furniture and had moved to mattresses and a dresser and then sat down on the steps and started complaining that he could not catch his breath.  His friend went out to check on him and reported that he had become very weak losing consciousness and had to be helped down the stairs and he had an episode where he completely lost consciousness.  Had some mild tremors with this event and when EMS arrived they felt that he was confused asking what happened.  Then when he was on the way here he had another event but they are not present to ask further questions but just reported he had a seizure and gave him Versed.  Patient has never had seizures in the past.  He cannot remember what happened.  He did report that he was having chest pain and shortness of breath before this started and it felt like his heart was racing.  He is currently complaining of numbness and tingling in his hands and feet and some pain in the left side of his chest.  He denies feeling short of breath but reports the pain is worse when he coughs.  He does have prior history of syncope.  He reports 2 weeks ago he had an episode of syncope when he got out of his chair and went to the bathroom causing him  to fall into the bathtub which he did not tell his wife about.  However she reports about 6 months ago he also had an episode of syncope.  No prior history of blood clots.  He has been eating and drinking and she make sure he takes his medications appropriately.  He has had no recent change in his medications.  He denies any recent infectious symptoms.  He has no abdominal pain or vomiting.  The history is provided by the patient, the EMS personnel and the spouse.  Altered Mental Status Associated symptoms: seizures   Seizures      Home Medications Prior to Admission medications   Medication Sig Start Date End Date Taking? Authorizing Provider  Accu-Chek Softclix Lancets lancets Use to check blood sugar three times daily. E11.65 04/26/22   Charlott Rakes, MD  amLODipine (NORVASC) 10 MG tablet Take 1 tablet (10 mg total) by mouth daily. 04/26/22   Charlott Rakes, MD  Blood Glucose Monitoring Suppl (ACCU-CHEK GUIDE) w/Device KIT Use to check blood sugar three times daily. E11.65 04/26/22   Charlott Rakes, MD  carvedilol (COREG) 6.25 MG tablet Take 1 tablet (6.25 mg total) by mouth 2 (two) times daily with a meal. 04/26/22   Charlott Rakes, MD  cetirizine (ZYRTEC) 10 MG tablet Take 1 tablet (10 mg total) by mouth daily. 08/27/21   Charlott Rakes, MD  cyclobenzaprine (FLEXERIL) 10 MG tablet  Take 1 tablet (10 mg total) by mouth 2 (two) times daily as needed for muscle spasms. 04/27/22   Charlott Rakes, MD  fluticasone (FLONASE) 50 MCG/ACT nasal spray Place 2 sprays into both nostrils daily. 12/18/20   Charlott Rakes, MD  furosemide (LASIX) 40 MG tablet Take 1 tablet (40 mg total) by mouth daily. 04/26/22   Charlott Rakes, MD  gabapentin (NEURONTIN) 300 MG capsule Take 2 capsules (600 mg total) by mouth 2 (two) times daily. 04/26/22   Charlott Rakes, MD  glucose blood (ACCU-CHEK GUIDE) test strip Use to check blood sugar three times daily. 04/26/22   Charlott Rakes, MD  insulin glargine (LANTUS) 100  UNIT/ML Solostar Pen Inject 20 Units into the skin at bedtime. 04/26/22   Charlott Rakes, MD  Insulin Pen Needle (BD PEN NEEDLE NANO U/F) 32G X 4 MM MISC use to inject insulin once daily 05/15/21   Charlott Rakes, MD  lisinopril (ZESTRIL) 20 MG tablet Take 1 tablet (20 mg total) by mouth daily. 04/26/22   Charlott Rakes, MD  meloxicam (MOBIC) 7.5 MG tablet Take 1 tablet (7.5 mg total) by mouth daily. 08/27/21   Charlott Rakes, MD  metFORMIN (GLUCOPHAGE) 500 MG tablet Take 2 tablets (1,000 mg total) by mouth 2 (two) times daily with a meal. 04/26/22   Charlott Rakes, MD  rosuvastatin (CRESTOR) 10 MG tablet Take 1 tablet (10 mg total) by mouth daily. 12/08/21 12/03/22  Revankar, Reita Cliche, MD  sildenafil (VIAGRA) 100 MG tablet Take 1 tablet (100 mg total) by mouth daily as needed for erectile dysfunction. At least 24 hrs between doses.  Don't take with nitroglycerin 08/26/21   Charlott Rakes, MD  tamsulosin (FLOMAX) 0.4 MG CAPS capsule Take 1 capsule (0.4 mg total) by mouth daily. 04/26/22   Charlott Rakes, MD      Allergies    Patient has no known allergies.    Review of Systems   Review of Systems  Neurological:  Positive for seizures.    Physical Exam Updated Vital Signs BP 106/68   Pulse 84   Temp 97.9 F (36.6 C) (Oral)   Resp (!) 28   SpO2 99%  Physical Exam Vitals and nursing note reviewed.  Constitutional:      General: He is not in acute distress.    Appearance: He is well-developed.  HENT:     Head: Normocephalic and atraumatic.  Eyes:     Conjunctiva/sclera: Conjunctivae normal.     Pupils: Pupils are equal, round, and reactive to light.  Cardiovascular:     Rate and Rhythm: Normal rate and regular rhythm.     Pulses: Normal pulses.     Heart sounds: No murmur heard.    Comments: Pulses are present in all 4 extremities slightly diminished in the feet Pulmonary:     Effort: Pulmonary effort is normal. No respiratory distress.     Breath sounds: Normal breath sounds. No  wheezing or rales.  Abdominal:     General: There is no distension.     Palpations: Abdomen is soft.     Tenderness: There is no abdominal tenderness. There is no guarding or rebound.  Musculoskeletal:        General: No tenderness. Normal range of motion.     Cervical back: Normal range of motion and neck supple.     Right lower leg: No edema.     Left lower leg: No edema.  Skin:    General: Skin is warm and dry.  Findings: No erythema or rash.  Neurological:     Mental Status: He is alert and oriented to person, place, and time.     Comments: 5 out of 5 strength in bilateral upper extremities, 4 out of 5 strength in bilateral lower extremities  Psychiatric:        Mood and Affect: Mood normal.        Behavior: Behavior normal.     ED Results / Procedures / Treatments   Labs (all labs ordered are listed, but only abnormal results are displayed) Labs Reviewed  CBC WITH DIFFERENTIAL/PLATELET  LACTIC ACID, PLASMA  LACTIC ACID, PLASMA  ETHANOL  COMPREHENSIVE METABOLIC PANEL  BRAIN NATRIURETIC PEPTIDE  D-DIMER, QUANTITATIVE  TROPONIN I (HIGH SENSITIVITY)    EKG EKG Interpretation  Date/Time:  Friday August 20 2022 14:49:49 EST Ventricular Rate:  89 PR Interval:  169 QRS Duration: 93 QT Interval:  402 QTC Calculation: 490 R Axis:   73 Text Interpretation: Sinus rhythm Probable left atrial enlargement Probable left ventricular hypertrophy new Nonspecific T abnormalities, diffuse leads Borderline prolonged QT interval Confirmed by Gwyneth Sprout ([id number]) on 08/20/2022 3:46:28 PM  Radiology No results found.  Procedures Procedures  {Document cardiac monitor, telemetry assessment procedure when appropriate:1}  Medications Ordered in ED Medications  lactated ringers bolus 500 mL (has no administration in time range)    ED Course/ Medical Decision Making/ A&P   {   Click here for ABCD2, HEART and other calculatorsREFRESH Note before signing :1}                           Medical Decision Making Amount and/or Complexity of Data Reviewed Labs: ordered. Radiology: ordered.   Pt with multiple medical problems and comorbidities and presenting today with a complaint that caries a high risk for morbidity and mortality.  Here today with most likely syncope but 2 episodes of altered mental status.  Patient did have associated chest pain and shortness of breath with the symptoms.  Concern for ACS versus dysrhythmia versus dissection versus PE.  Lower suspicion for stroke or seizure at this time.  Also could be related to hypotension as patient's blood pressure was 70s systolic upon arrival which did not improve with IV fluids now in the low 100s.  He denies any recent infectious symptoms and does report being compliant with his medications.  He is on insulin and multiple blood pressure medications.  He does not take any anticoagulation and did not hit his head or injure himself today.  He is complaining of numbness in his hands and feet but is able to move everything does not have focal deficits identified.  I independently interpreted patient's EKG which shows new T wave inversion laterally today which was not present last year.  Patient was given Versed by EMS due to their concern that he had had a seizure so he is slightly sleepy but still awake and mentating.  Patient's wife is present at bedside and gives majority of the history.   {Document critical care time when appropriate:1} {Document review of labs and clinical decision tools ie heart score, Chads2Vasc2 etc:1}  {Document your independent review of radiology images, and any outside records:1} {Document your discussion with family members, caretakers, and with consultants:1} {Document social determinants of health affecting pt's care:1} {Document your decision making why or why not admission, treatments were needed:1} Final Clinical Impression(s) / ED Diagnoses Final diagnoses:  None    Rx / DC  Orders ED Discharge Orders     None

## 2022-08-20 NOTE — ED Triage Notes (Signed)
Patient bib [facility] from a friends house they appeared to be moving. Per ems friend called ems saying he was complaining of SOB and chest tightness. Had a similar episode like this 2-3 weeks ago. Ems states he was diaphoretic and altered, hallucinating. He did have 2 seizures lasting about 30 sec each. Patient denies alcohol or drug use. He was given 5mg  midazolam IM.

## 2022-08-20 NOTE — ED Notes (Signed)
Patient came back from MRI upon going to patients room he was not in there gown was laying on bed. Checked the bathroom he was not in there. Called patients number listed in chart with no answer.

## 2022-08-20 NOTE — ED Provider Notes (Signed)
Patient signed out to me at 1600 by Dr. Maryan Rued pending labs and reassessment.  In short this is a 57 year old male with a past medical history of hypertension, diabetes, CHF that presented to the emergency department for syncopal versus seizure episode.  Patient reports he remembers feeling tingling in his fingers, chest tightness and lightheadedness prior to the episode.  Patient was initially hypotensive to the 70s on arrival and received fluids and upon my evaluation blood pressures improved to the 765Y systolic and the patient feels back to his baseline.  He has no acute neurologic deficits on exam.  EKG showed new inferoseptal T wave inversions the remainder of his labs and workup is pending at this time.  He will be closely reassessed.  Clinical Course as of 08/20/22 1833  Fri Aug 20, 2022  1829 Patient eloped from the emergency department prior to completion of work up. I called the patient's phone number and significant other answered stating he asked her to drive him home because he "felt better and didn't want to be here anymore". She states she dropped him off at home and is no longer with him. She was informed that we are unable to determine if he has a life threatening condition without completion of his work up and urged him to return to the emergency department especially if he has any new or worsening symptoms. [VK]    Clinical Course User Index [VK] Kemper Durie, DO      Kemper Durie, [place] 08/20/22 [phone]

## 2022-08-23 ENCOUNTER — Telehealth: Payer: Self-pay

## 2022-08-23 NOTE — Patient Outreach (Signed)
Transition Care Management Follow-up Telephone Call Date of discharge and from where: Eloped 08/20/22 [facility] How have you been since you were released from the hospital? BSW spoke with patients significant other and she states he is fine and back to normal, she is making sure he takes his medications Any questions or concerns? Yes  Items Reviewed: Did the pt receive and understand the discharge instructions provided?  None given Medications obtained and verified? No  Other? No  Any new allergies since your discharge? No  Dietary orders reviewed? No  Do you have support at home? Yes   Home Care and Equipment/Supplies: Were home health services ordered? not applicable If so, what is the name of the agency?   Has the agency set up a time to come to the patient's home? not applicable Were any new equipment or medical supplies ordered?  No What is the name of the medical supply agency?  Were you able to get the supplies/equipment? not applicable Do you have any questions related to the use of the equipment or supplies? No  Functional Questionnaire: (I = Independent and D = Dependent) ADLs: I  Bathing/Dressing- I  Meal Prep- I  Eating- I  Maintaining continence- I  Transferring/Ambulation- I  Managing Meds- I  Follow up appointments reviewed:  PCP Hospital f/u appt confirmed? No  Scheduled to see  on  @ . [facility] f/u appt confirmed? No  Scheduled to see  on  @ . Are transportation arrangements needed? No  If their condition worsens, is the pt aware to call PCP or go to the Emergency Dept.? Yes Was the patient provided with contact information for the PCP's office or ED? Yes Was to pt encouraged to call back with questions or concerns? Yes

## 2022-08-27 ENCOUNTER — Other Ambulatory Visit (HOSPITAL_COMMUNITY): Payer: Self-pay

## 2022-08-27 ENCOUNTER — Other Ambulatory Visit: Payer: Self-pay

## 2022-09-20 ENCOUNTER — Other Ambulatory Visit: Payer: Self-pay

## 2022-09-20 ENCOUNTER — Other Ambulatory Visit: Payer: Self-pay | Admitting: Family Medicine

## 2022-09-21 ENCOUNTER — Other Ambulatory Visit: Payer: Self-pay

## 2022-09-21 MED ORDER — CETIRIZINE HCL 10 MG PO TABS
10.0000 mg | ORAL_TABLET | Freq: Every day | ORAL | 0 refills | Status: DC
  Filled 2022-09-21 – 2022-09-24 (×2): qty 90, 90d supply, fill #0

## 2022-09-21 NOTE — Telephone Encounter (Signed)
Requested Prescriptions  Pending Prescriptions Disp Refills   cetirizine (ZYRTEC) 10 MG tablet 90 tablet 0    Sig: Take 1 tablet (10 mg total) by mouth daily.     Ear, Nose, and Throat:  Antihistamines 2 Failed - 09/20/2022  9:49 AM      Failed - Cr in normal range and within 360 days    Creatinine  Date Value Ref Range Status  03/16/2014 1.00 0.60 - 1.30 mg/dL Final   Creatinine, Ser  Date Value Ref Range Status  08/20/2022 1.81 (H) 0.61 - 1.24 mg/dL Final         Passed - Valid encounter within last 12 months    Recent Outpatient Visits           4 months ago Type 2 diabetes mellitus with hyperglycemia, with long-term current use of insulin ([facility])   [place], [place], MD   1 year ago Type 2 diabetes mellitus with hyperglycemia, with long-term current use of insulin ([facility])   [place], [place], MD   1 year ago Type 2 diabetes mellitus with hyperglycemia, with long-term current use of insulin ([facility])   [place] Charlott Rakes, MD   1 year ago Acute non-recurrent sinusitis of other sinus   [place], Charlane Ferretti, MD   2 years ago Type 2 diabetes mellitus with hyperglycemia, with long-term current use of insulin ([facility])   [place], [place], MD       Future Appointments             In 6 days Revankar, Reita Cliche, MD [place] [facility]   In 3 weeks Charlott Rakes, MD [place]

## 2022-09-22 ENCOUNTER — Other Ambulatory Visit: Payer: Self-pay

## 2022-09-23 ENCOUNTER — Other Ambulatory Visit: Payer: Self-pay

## 2022-09-23 ENCOUNTER — Other Ambulatory Visit (HOSPITAL_COMMUNITY): Payer: Self-pay

## 2022-09-24 ENCOUNTER — Other Ambulatory Visit: Payer: Self-pay

## 2022-09-27 ENCOUNTER — Ambulatory Visit: Payer: Medicaid Other | Admitting: Cardiology

## 2022-10-13 ENCOUNTER — Ambulatory Visit: Payer: Medicaid Other | Attending: Family Medicine | Admitting: Family Medicine

## 2022-10-13 ENCOUNTER — Encounter: Payer: Self-pay | Admitting: Family Medicine

## 2022-10-13 ENCOUNTER — Other Ambulatory Visit: Payer: Self-pay

## 2022-10-13 VITALS — BP 139/85 | HR 88 | Ht 69.0 in | Wt 179.0 lb

## 2022-10-13 DIAGNOSIS — E1142 Type 2 diabetes mellitus with diabetic polyneuropathy: Secondary | ICD-10-CM

## 2022-10-13 DIAGNOSIS — Z91148 Patient's other noncompliance with medication regimen for other reason: Secondary | ICD-10-CM

## 2022-10-13 DIAGNOSIS — I110 Hypertensive heart disease with heart failure: Secondary | ICD-10-CM | POA: Diagnosis not present

## 2022-10-13 DIAGNOSIS — Z5941 Food insecurity: Secondary | ICD-10-CM | POA: Diagnosis not present

## 2022-10-13 DIAGNOSIS — I152 Hypertension secondary to endocrine disorders: Secondary | ICD-10-CM

## 2022-10-13 DIAGNOSIS — I5042 Chronic combined systolic (congestive) and diastolic (congestive) heart failure: Secondary | ICD-10-CM | POA: Diagnosis not present

## 2022-10-13 DIAGNOSIS — E1159 Type 2 diabetes mellitus with other circulatory complications: Secondary | ICD-10-CM | POA: Diagnosis not present

## 2022-10-13 DIAGNOSIS — E1165 Type 2 diabetes mellitus with hyperglycemia: Secondary | ICD-10-CM | POA: Diagnosis not present

## 2022-10-13 DIAGNOSIS — Z794 Long term (current) use of insulin: Secondary | ICD-10-CM | POA: Diagnosis not present

## 2022-10-13 DIAGNOSIS — R051 Acute cough: Secondary | ICD-10-CM

## 2022-10-13 LAB — POCT GLYCOSYLATED HEMOGLOBIN (HGB A1C): HbA1c, POC (controlled diabetic range): 7.2 % — AB (ref 0.0–7.0)

## 2022-10-13 LAB — GLUCOSE, POCT (MANUAL RESULT ENTRY): POC Glucose: 147 mg/dl — AB (ref 70–99)

## 2022-10-13 MED ORDER — BENZONATATE 100 MG PO CAPS
100.0000 mg | ORAL_CAPSULE | Freq: Two times a day (BID) | ORAL | 0 refills | Status: DC | PRN
  Filled 2022-10-13: qty 20, 10d supply, fill #0

## 2022-10-13 MED ORDER — GABAPENTIN 300 MG PO CAPS
600.0000 mg | ORAL_CAPSULE | Freq: Two times a day (BID) | ORAL | 3 refills | Status: DC
  Filled 2022-10-13 – 2022-10-25 (×2): qty 120, 30d supply, fill #0
  Filled 2022-11-19 – 2022-11-22 (×2): qty 120, 30d supply, fill #1
  Filled 2022-12-20: qty 120, 30d supply, fill #2
  Filled 2023-01-17: qty 120, 30d supply, fill #0
  Filled 2023-01-17: qty 120, 30d supply, fill #3

## 2022-10-13 MED ORDER — FUROSEMIDE 40 MG PO TABS
40.0000 mg | ORAL_TABLET | Freq: Every day | ORAL | 1 refills | Status: DC
  Filled 2022-10-13 – 2023-01-17 (×2): qty 90, 90d supply, fill #0
  Filled 2023-01-17: qty 90, 90d supply, fill #1

## 2022-10-13 MED ORDER — TAMSULOSIN HCL 0.4 MG PO CAPS
0.4000 mg | ORAL_CAPSULE | Freq: Every day | ORAL | 1 refills | Status: DC
  Filled 2022-10-13: qty 90, 90d supply, fill #0
  Filled 2023-01-17: qty 90, 90d supply, fill #1
  Filled 2023-01-17: qty 90, 90d supply, fill #0

## 2022-10-13 MED ORDER — CARVEDILOL 6.25 MG PO TABS
6.2500 mg | ORAL_TABLET | Freq: Two times a day (BID) | ORAL | 1 refills | Status: DC
  Filled 2022-10-13 – 2023-01-17 (×2): qty 180, 90d supply, fill #0
  Filled 2023-01-17: qty 180, 90d supply, fill #1

## 2022-10-13 MED ORDER — AMLODIPINE BESYLATE 10 MG PO TABS
10.0000 mg | ORAL_TABLET | Freq: Every day | ORAL | 1 refills | Status: DC
  Filled 2022-10-13 – 2022-12-20 (×2): qty 90, 90d supply, fill #0
  Filled 2023-03-28: qty 90, 90d supply, fill #1

## 2022-10-13 MED ORDER — ROSUVASTATIN CALCIUM 10 MG PO TABS
10.0000 mg | ORAL_TABLET | Freq: Every day | ORAL | 1 refills | Status: DC
  Filled 2022-10-13 – 2023-01-17 (×2): qty 90, 90d supply, fill #0
  Filled 2023-01-17: qty 90, 90d supply, fill #1

## 2022-10-13 MED ORDER — LISINOPRIL 20 MG PO TABS
20.0000 mg | ORAL_TABLET | Freq: Every day | ORAL | 1 refills | Status: DC
  Filled 2022-10-13: qty 90, 90d supply, fill #0
  Filled 2023-01-17: qty 90, 90d supply, fill #1
  Filled 2023-01-17: qty 90, 90d supply, fill #0

## 2022-10-13 MED ORDER — METFORMIN HCL 500 MG PO TABS
1000.0000 mg | ORAL_TABLET | Freq: Two times a day (BID) | ORAL | 1 refills | Status: DC
  Filled 2022-10-13: qty 360, 90d supply, fill #0
  Filled 2023-01-17: qty 360, 90d supply, fill #1
  Filled 2023-01-17: qty 360, 90d supply, fill #0

## 2022-10-13 NOTE — Progress Notes (Signed)
Subjective:  Patient ID: Kenneth Rios, male    DOB: 1965/08/11  Age: 57 y.o. MRN: [id number]  CC: Diabetes   HPI Kenneth Rios is a 57 y.o. 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   Interval History:  He is on a fixed income and after he receives his SSI income and pays his rent and utilities he has nothing left. He has difficulty obtaining food and his medications and he states the pharmacy will not give him his medications because he cannot afford the co-pay.  He had had similar complaints at his last visit and was seen by the case manager and referred to legal aid.  Complains of dyspnea and sometimes has to sit upright to sleep.  He has no chest pains.  He has been without his Lasix.  Denies presence of pedal edema, palpitations, lightheadedness. He has been coughing x2 weeks especially after taking a deep breath.  Cough is nonproductive.  He has no wheezing. Past Medical History:  Diagnosis Date   (HFpEF) heart failure with preserved ejection fraction ([place]) 03/30/2018   Accelerated hypertension 06/05/2018   Acute viral bronchitis 08/24/2017   Adhesive capsulitis of left shoulder 08/27/2021   Angina pectoris ([place]) 03/29/2018   Anxiety    Anxiety and depression 02/06/2018   Asthma with status asthmaticus    CAD (coronary artery disease) 12/03/2021   Chest tightness 03/29/2018   CHF (congestive heart failure) ([place])    "fluttering"   Depression    Diabetes mellitus due to underlying condition with unspecified complications ([place]) [id number]   Diabetes mellitus without complication ([place])    Diverticulitis    Diverticulitis large intestine 05/31/2018   GERD  (gastroesophageal reflux disease)    Hyperlipidemia    Hypertension    Hypertension associated with diabetes ([place]) 08/24/2017   Hypertensive heart disease with chronic combined systolic and diastolic congestive heart failure ([place]) 08/24/2017   Hypertensive urgency 08/24/2017   Microcytic anemia 12/19/2018   Mixed dyslipidemia 01/09/2020   Peritonitis ([place]) 09/02/2017   Pneumonia 12/16/2018   Polysubstance abuse ([place]) 12/19/2018   Prolonged Q-T interval on ECG 02/06/2018   S/P colostomy ([place]) 09/16/2017   Type 2 diabetes mellitus with hyperglycemia, with long-term current use of insulin ([place]) 08/27/2021   Unstable angina ([place]) 03/29/2018    Past Surgical History:  Procedure Laterality Date   CARDIAC CATHETERIZATION  03/30/2018   COLON RESECTION N/A 09/05/2017   Procedure: HARTMAN'S COLECTOMY AND COLOSTOMY;  Surgeon: Mickeal Skinner, MD;  Location: [facility];  Service: General;  Laterality: N/A;   COLON SURGERY     COLOSTOMY TAKEDOWN N/A 05/31/2018   Procedure: [place];  Surgeon: Kieth Brightly [name], MD;  Location: [facility];  Service: General;  Laterality: N/A;   INTRAVASCULAR PRESSURE WIRE/FFR STUDY N/A 03/30/2018   Procedure: INTRAVASCULAR PRESSURE WIRE/FFR STUDY;  Surgeon: Nelva Bush, MD;  Location: [facility];  Service: Cardiovascular;  Laterality: N/A;   LEFT HEART CATH AND CORONARY ANGIOGRAPHY N/A 03/30/2018   Procedure: LEFT HEART CATH AND CORONARY ANGIOGRAPHY;  Surgeon: Nelva Bush, MD;  Location: [facility];  Service: Cardiovascular;  Laterality: N/A;    Family History  Problem Relation Age of  Onset   Heart disease Mother    Heart disease Sister    Diabetes Maternal Aunt    Heart disease Maternal Aunt    Diabetes Maternal Uncle    Sudden Cardiac Death Neg Hx     Social History   Socioeconomic History   Marital status: Single    Spouse name: Not on file   Number of children: 3    Years of education: Not on file   Highest education level: Not on file  Occupational History   Occupation: Disabled  Tobacco Use   Smoking status: Former   Smokeless tobacco: Never  Scientific laboratory technician Use: Never used  Substance and Sexual Activity   Alcohol use: Yes    Alcohol/week: 6.0 standard drinks of alcohol    Types: 6 Cans of beer per week    Comment: on the weekends   Drug use: No   Sexual activity: Yes    Partners: Female  Other Topics Concern   Not on file  Social History Narrative   Not on file   Social Determinants of Health   Financial Resource Strain: Not on file  Food Insecurity: No Food Insecurity (08/23/2022)   Hunger Vital Sign    Worried About Running Out of Food in the Last Year: Never true    [place] in the Last Year: Never true  Transportation Needs: Unknown (08/23/2022)   PRAPARE - Hydrologist (Medical): No    Lack of Transportation (Non-Medical): Not on file  Physical Activity: Not on file  Stress: Not on file  Social Connections: Not on file    No Known Allergies  Outpatient Medications Prior to Visit  Medication Sig Dispense Refill   Accu-Chek Softclix Lancets lancets Use to check blood sugar three times daily. E11.65 (Patient not taking: Reported on 10/13/2022) 100 each 2   Blood Glucose Monitoring Suppl (ACCU-CHEK GUIDE) w/Device KIT Use to check blood sugar three times daily. E11.65 (Patient not taking: Reported on 10/13/2022) 1 kit 0   cyclobenzaprine (FLEXERIL) 10 MG tablet Take 1 tablet (10 mg total) by mouth 2 (two) times daily as needed for muscle spasms. (Patient not taking: Reported on 10/13/2022) 60 tablet 1   glucose blood (ACCU-CHEK GUIDE) test strip Use to check blood sugar three times daily. (Patient not taking: Reported on 10/13/2022) 100 each 2   insulin glargine (LANTUS) 100 UNIT/ML Solostar Pen Inject 20 Units into the skin at bedtime. (Patient not taking: Reported on 10/13/2022) 30 mL 6   Insulin  Pen Needle (BD PEN NEEDLE NANO U/F) 32G X 4 MM MISC use to inject insulin once daily (Patient not taking: Reported on 10/13/2022) 100 each 11   amLODipine (NORVASC) 10 MG tablet Take 1 tablet (10 mg total) by mouth daily. (Patient not taking: Reported on 10/13/2022) 90 tablet 1   carvedilol (COREG) 6.25 MG tablet Take 1 tablet (6.25 mg total) by mouth 2 (two) times daily with a meal. (Patient not taking: Reported on 10/13/2022) 180 tablet 1   cetirizine (ZYRTEC) 10 MG tablet Take 1 tablet (10 mg total) by mouth daily. (Patient not taking: Reported on 10/13/2022) 90 tablet 0   fluticasone (FLONASE) 50 MCG/ACT nasal spray Place 2 sprays into both nostrils daily. (Patient not taking: Reported on 10/13/2022) 16 g 0   furosemide (LASIX) 40 MG tablet Take 1 tablet (40 mg total) by mouth daily. (Patient not taking: Reported on 10/13/2022) 90 tablet 1   gabapentin (NEURONTIN)  300 MG capsule Take 2 capsules (600 mg total) by mouth 2 (two) times daily. (Patient not taking: Reported on 10/13/2022) 120 capsule 3   lisinopril (ZESTRIL) 20 MG tablet Take 1 tablet (20 mg total) by mouth daily. (Patient not taking: Reported on 10/13/2022) 90 tablet 1   meloxicam (MOBIC) 7.5 MG tablet Take 1 tablet (7.5 mg total) by mouth daily. (Patient not taking: Reported on 10/13/2022) 30 tablet 2   metFORMIN (GLUCOPHAGE) 500 MG tablet Take 2 tablets (1,000 mg total) by mouth 2 (two) times daily with a meal. (Patient not taking: Reported on 10/13/2022) 360 tablet 1   rosuvastatin (CRESTOR) 10 MG tablet Take 1 tablet (10 mg total) by mouth daily. (Patient not taking: Reported on 10/13/2022) 90 tablet 3   sildenafil (VIAGRA) 100 MG tablet Take 1 tablet (100 mg total) by mouth daily as needed for erectile dysfunction. At least 24 hrs between doses.  Don't take with nitroglycerin (Patient not taking: Reported on 10/13/2022) 10 tablet 1   tamsulosin (FLOMAX) 0.4 MG CAPS capsule Take 1 capsule (0.4 mg total) by mouth daily. (Patient not taking: Reported on  10/13/2022) 90 capsule 1   No facility-administered medications prior to visit.     ROS Review of Systems  Constitutional:  Negative for activity change and appetite change.  HENT:  Negative for sinus pressure and sore throat.   Respiratory:  Positive for cough and shortness of breath. Negative for chest tightness and wheezing.   Cardiovascular:  Negative for chest pain and palpitations.  Gastrointestinal:  Negative for abdominal distention, abdominal pain and constipation.  Genitourinary: Negative.   Musculoskeletal: Negative.   Psychiatric/Behavioral:  Negative for behavioral problems and dysphoric mood.     Objective:  BP 139/85   Pulse 88   Ht '5\' 9"'$  (1.753 m)   Wt 179 lb (81.2 kg)   SpO2 100%   BMI 26.43 kg/m      10/13/2022    3:33 PM 08/20/2022    4:45 PM 08/20/2022    4:30 PM  BP/Weight  Systolic BP [id number]  Diastolic BP 85 75 81  Wt. (Lbs) 179    BMI 26.43 kg/m2        Physical Exam Constitutional:      Appearance: He is well-developed.  Cardiovascular:     Rate and Rhythm: Normal rate.     Heart sounds: Normal heart sounds. No murmur heard. Pulmonary:     Effort: Pulmonary effort is normal.     Breath sounds: Normal breath sounds. No wheezing or rales.  Chest:     Chest wall: No tenderness.  Abdominal:     General: Bowel sounds are normal. There is no distension.     Palpations: Abdomen is soft. There is no mass.     Tenderness: There is no abdominal tenderness.  Musculoskeletal:        General: Normal range of motion.     Right lower leg: No edema.     Left lower leg: No edema.  Neurological:     Mental Status: He is alert and oriented to person, place, and time.  Psychiatric:        Mood and Affect: Mood normal.        Latest Ref Rng & Units 08/20/2022    3:10 PM 04/26/2022    3:58 PM 12/03/2021    8:58 AM  CMP  Glucose 70 - 99 mg/dL 106  128  353   BUN 6 - 20 mg/dL 18  17  9   Creatinine 0.61 - 1.24 mg/dL 1.81  1.10  0.98   Sodium 135  - 145 mmol/L 136  137  138   Potassium 3.5 - 5.1 mmol/L 3.8  4.9  4.8   Chloride 98 - 111 mmol/L 101  100  100   CO2 22 - 32 mmol/L '24  23  24   '$ Calcium 8.9 - 10.3 mg/dL 9.3  9.6  9.9   Total Protein 6.5 - 8.1 g/dL 7.2   6.7   Total Bilirubin 0.3 - 1.2 mg/dL 1.0   0.6   Alkaline Phos 38 - 126 U/L 52   65   AST 15 - 41 U/L 20   12   ALT 0 - 44 U/L 11   11     Lipid Panel     Component Value Date/Time   CHOL 245 (H) 12/03/2021 0858   TRIG 138 12/03/2021 0858   HDL 49 12/03/2021 0858   CHOLHDL 5.0 12/03/2021 0858   CHOLHDL 6.1 03/31/2018 0227   VLDL UNABLE TO CALCULATE IF TRIGLYCERIDE OVER 400 mg/dL 03/31/2018 0227   LDLCALC 171 (H) 12/03/2021 0858    CBC    Component Value Date/Time   WBC 5.8 08/20/2022 1510   RBC 5.05 08/20/2022 1510   HGB 14.9 08/20/2022 1510   HGB 14.1 12/03/2021 0858   HCT 42.5 08/20/2022 1510   HCT 40.0 12/03/2021 0858   PLT 361 08/20/2022 1510   PLT 278 12/03/2021 0858   MCV 84.2 08/20/2022 1510   MCV 84 12/03/2021 0858   MCV 82 03/14/2014 0400   MCH 29.5 08/20/2022 1510   MCHC 35.1 08/20/2022 1510   RDW 12.1 08/20/2022 1510   RDW 13.1 12/03/2021 0858   RDW 13.5 03/14/2014 0400   LYMPHSABS 2.1 08/20/2022 1510   LYMPHSABS 2.4 12/03/2021 0858   LYMPHSABS 0.9 (L) 03/14/2014 0400   MONOABS 0.4 08/20/2022 1510   MONOABS 0.1 (L) 03/14/2014 0400   EOSABS 0.2 08/20/2022 1510   EOSABS 0.2 12/03/2021 0858   EOSABS 0.0 03/14/2014 0400   BASOSABS 0.0 08/20/2022 1510   BASOSABS 0.0 12/03/2021 0858   BASOSABS 0.0 03/14/2014 0400    Lab Results  Component Value Date   HGBA1C 7.2 (A) 10/13/2022    Assessment & Plan:  1. Type 2 diabetes mellitus with hyperglycemia, with long-term current use of insulin (HCC) Controlled with A1c of 7.2 Control is likely due to poor oral intake as a result of financial constraints Advised to hold off on Lantus and just restart metformin.  If blood sugars are high then he will restart his Lantus Counseled on Diabetic  diet, my plate method, [id number] minutes of moderate intensity exercise/week Blood sugar logs with fasting goals of 80-120 mg/dl, random of less than 180 and in the event of sugars less than 60 mg/dl or greater than 400 mg/dl encouraged to notify the clinic. Advised on the need for annual eye exams, annual foot exams, Pneumonia vaccine. - POCT glucose (manual entry) - POCT glycosylated hemoglobin (Hb A1C) - Microalbumin/Creatinine Ratio, Urine - metFORMIN (GLUCOPHAGE) 500 MG tablet; Take 2 tablets (1,000 mg total) by mouth 2 (two) times daily with a meal.  Dispense: 360 tablet; Refill: 1  2. Acute cough Will need to exclude CHF exacerbation as etiology Chest x-ray ordered - benzonatate (TESSALON) 100 MG capsule; Take 1 capsule (100 mg total) by mouth 2 (two) times daily as needed for cough.  Dispense: 20 capsule; Refill: 0 - DG Chest 2 View;  Future  3. Hypertension associated with diabetes ([place])  Blood pressure is slightly above goal but not as high as would be expected given he has not been on his antihypertensive Medications had already been refilled but I called the patient and left a message with his significant other for him to hold off on amlodipine and take half of his Coreg in half of lisinopril. He is to monitor his blood pressure and notify me if he has hypotension. Counseled on blood pressure goal of less than 130/80, low-sodium, DASH diet, medication compliance, 150 minutes of moderate intensity exercise per week. Discussed medication compliance, adverse effects. - amLODipine (NORVASC) 10 MG tablet; Take 1 tablet (10 mg total) by mouth daily.  Dispense: 90 tablet; Refill: 1 - carvedilol (COREG) 6.25 MG tablet; Take 1 tablet (6.25 mg total) by mouth 2 (two) times daily with a meal.  Dispense: 180 tablet; Refill: 1 - lisinopril (ZESTRIL) 20 MG tablet; Take 1 tablet (20 mg total) by mouth daily.  Dispense: 90 tablet; Refill: 1  4. Hypertensive heart disease with chronic combined systolic  and diastolic congestive heart failure (HCC) Possible CHF exacerbation. Refilled Lasix Will send chest x-ray Restart medications Consider SGLT2 addition at next visit - furosemide (LASIX) 40 MG tablet; Take 1 tablet (40 mg total) by mouth daily.  Dispense: 90 tablet; Refill: 1 - rosuvastatin (CRESTOR) 10 MG tablet; Take 1 tablet (10 mg total) by mouth daily.  Dispense: 90 tablet; Refill: 1  5. Diabetic polyneuropathy associated with type 2 diabetes mellitus (HCC) Stable - gabapentin (NEURONTIN) 300 MG capsule; Take 2 capsules (600 mg total) by mouth 2 (two) times daily.  Dispense: 120 capsule; Refill: 3  6. Nonadherence to medication Financial constraints contributing We have spoken with the pharmacy who has given him a 1 month supply  7. Food insecurity Case manager has met with him at in the past to discuss available options for assistance  Meds ordered this encounter  Medications   benzonatate (TESSALON) 100 MG capsule    Sig: Take 1 capsule (100 mg total) by mouth 2 (two) times daily as needed for cough.    Dispense:  20 capsule    Refill:  0   amLODipine (NORVASC) 10 MG tablet    Sig: Take 1 tablet (10 mg total) by mouth daily.    Dispense:  90 tablet    Refill:  1   carvedilol (COREG) 6.25 MG tablet    Sig: Take 1 tablet (6.25 mg total) by mouth 2 (two) times daily with a meal.    Dispense:  180 tablet    Refill:  1   furosemide (LASIX) 40 MG tablet    Sig: Take 1 tablet (40 mg total) by mouth daily.    Dispense:  90 tablet    Refill:  1   gabapentin (NEURONTIN) 300 MG capsule    Sig: Take 2 capsules (600 mg total) by mouth 2 (two) times daily.    Dispense:  120 capsule    Refill:  3   lisinopril (ZESTRIL) 20 MG tablet    Sig: Take 1 tablet (20 mg total) by mouth daily.    Dispense:  90 tablet    Refill:  1   metFORMIN (GLUCOPHAGE) 500 MG tablet    Sig: Take 2 tablets (1,000 mg total) by mouth 2 (two) times daily with a meal.    Dispense:  360 tablet    Refill:   1   rosuvastatin (CRESTOR) 10 MG tablet  Sig: Take 1 tablet (10 mg total) by mouth daily.    Dispense:  90 tablet    Refill:  1   tamsulosin (FLOMAX) 0.4 MG CAPS capsule    Sig: Take 1 capsule (0.4 mg total) by mouth daily.    Dispense:  90 capsule    Refill:  1    Follow-up: Return in about 3 months (around 01/13/2023) for Chronic medical conditions.       Charlott Rakes, MD, FAAFP. [facility] and [place], [place]   10/13/2022, 6:06 PM

## 2022-10-13 NOTE — Patient Instructions (Signed)

## 2022-10-13 NOTE — Progress Notes (Signed)
SOB Lightheaded Coughing. Not taking any medication due to cost.

## 2022-10-15 ENCOUNTER — Other Ambulatory Visit: Payer: Self-pay

## 2022-10-25 ENCOUNTER — Other Ambulatory Visit: Payer: Self-pay | Admitting: Family Medicine

## 2022-10-25 ENCOUNTER — Other Ambulatory Visit: Payer: Self-pay

## 2022-10-25 DIAGNOSIS — M7502 Adhesive capsulitis of left shoulder: Secondary | ICD-10-CM

## 2022-10-26 ENCOUNTER — Other Ambulatory Visit: Payer: Self-pay

## 2022-10-26 ENCOUNTER — Other Ambulatory Visit (HOSPITAL_COMMUNITY): Payer: Self-pay

## 2022-10-26 NOTE — Telephone Encounter (Signed)
Requested medication (s) are due for refill today: yes  Requested medication (s) are on the active medication list: yes    Last refill: 04/27/22  #60 1 refill  Future visit scheduled yes 01/19/23  Notes to clinic:Not delegated, please review. Thank you  Requested Prescriptions  Pending Prescriptions Disp Refills   cyclobenzaprine (FLEXERIL) 10 MG tablet 60 tablet 1    Sig: Take 1 tablet (10 mg total) by mouth 2 (two) times daily as needed for muscle spasms.     Not Delegated - Analgesics:  Muscle Relaxants Failed - 10/25/2022  6:31 PM      Failed - This refill cannot be delegated      Passed - Valid encounter within last 6 months    Recent Outpatient Visits           1 week ago Type 2 diabetes mellitus with hyperglycemia, with long-term current use of insulin ([place])   [place], [place], MD   6 months ago Type 2 diabetes mellitus with hyperglycemia, with long-term current use of insulin ([place])   [place], Charlane Ferretti, MD   1 year ago Type 2 diabetes mellitus with hyperglycemia, with long-term current use of insulin ([place])   [place], Charlane Ferretti, MD   1 year ago Type 2 diabetes mellitus with hyperglycemia, with long-term current use of insulin ([place])   [place], Enobong, MD   1 year ago Acute non-recurrent sinusitis of other sinus   [place], Enobong, MD       Future Appointments             Tomorrow Rev[place], Reita Cliche, MD [place] at [facility]   In 2 months Charlott Rakes, MD [place]

## 2022-10-27 ENCOUNTER — Other Ambulatory Visit: Payer: Self-pay

## 2022-10-27 ENCOUNTER — Ambulatory Visit: Payer: Medicaid Other | Attending: Cardiology | Admitting: Cardiology

## 2022-10-27 ENCOUNTER — Encounter: Payer: Self-pay | Admitting: Cardiology

## 2022-10-27 VITALS — BP 114/66 | HR 92 | Ht 69.0 in | Wt 180.1 lb

## 2022-10-27 DIAGNOSIS — I2510 Atherosclerotic heart disease of native coronary artery without angina pectoris: Secondary | ICD-10-CM

## 2022-10-27 DIAGNOSIS — I5030 Unspecified diastolic (congestive) heart failure: Secondary | ICD-10-CM | POA: Diagnosis not present

## 2022-10-27 DIAGNOSIS — E088 Diabetes mellitus due to underlying condition with unspecified complications: Secondary | ICD-10-CM

## 2022-10-27 DIAGNOSIS — E1159 Type 2 diabetes mellitus with other circulatory complications: Secondary | ICD-10-CM | POA: Diagnosis not present

## 2022-10-27 DIAGNOSIS — E782 Mixed hyperlipidemia: Secondary | ICD-10-CM

## 2022-10-27 DIAGNOSIS — I152 Hypertension secondary to endocrine disorders: Secondary | ICD-10-CM

## 2022-10-27 MED ORDER — NITROGLYCERIN 0.4 MG SL SUBL
0.4000 mg | SUBLINGUAL_TABLET | SUBLINGUAL | 6 refills | Status: DC | PRN
  Filled 2022-10-27: qty 25, 1d supply, fill #0
  Filled 2023-01-17: qty 25, 7d supply, fill #0
  Filled 2023-01-17: qty 25, 8d supply, fill #1

## 2022-10-27 MED ORDER — ASPIRIN 81 MG PO TBEC: 81.0000 mg | DELAYED_RELEASE_TABLET | Freq: Every day | ORAL | 3 refills | Status: AC

## 2022-10-27 MED ORDER — CYCLOBENZAPRINE HCL 10 MG PO TABS
10.0000 mg | ORAL_TABLET | Freq: Two times a day (BID) | ORAL | 1 refills | Status: DC | PRN
  Filled 2022-10-27: qty 60, 30d supply, fill #0
  Filled 2022-11-19 – 2022-11-24 (×2): qty 60, 30d supply, fill #1

## 2022-10-27 NOTE — Patient Instructions (Signed)
Medication Instructions:  Your physician has recommended you make the following change in your medication:   Start taking 81 mg coated aspirin daily  Use nitroglycerin 1 tablet placed under the tongue at the first sign of chest pain or an angina attack. 1 tablet may be used every 5 minutes as needed, for up to 15 minutes. Do not take more than 3 tablets in 15 minutes. If pain persist call 911 or go to the nearest ED.   *If you need a refill on your cardiac medications before your next appointment, please call your pharmacy*   Lab Work: Your physician recommends that you return for lab work in: the next few days for CMP and lipids. You need to have labs done when you are fasting. [facility] lab is located on the 3rd floor, [address]. Hours are Monday - Friday 8 am to 4 pm, closed 11:30 am to 1:00 pm. You do NOT need an appointment.   If you have labs (blood work) drawn today and your tests are completely normal, you will receive your results only by: [place] (if you have MyChart) OR A paper copy in the mail If you have any lab test that is abnormal or we need to change your treatment, we will call you to review the results.   Testing/Procedures: None ordered   Follow-Up: At [facility], you and your health needs are our priority.  As part of our continuing mission to provide you with exceptional heart care, we have created designated Provider Care Teams.  These Care Teams include your primary Cardiologist (physician) and Advanced Practice Providers (APPs -  Physician Assistants and Nurse Practitioners) who all work together to provide you with the care you need, when you need it.  We recommend signing up for the patient portal called "MyChart".  Sign up information is provided on this After Visit Summary.  MyChart is used to connect with patients for Virtual Visits (Telemedicine).  Patients are able to view lab/test results, encounter notes, upcoming appointments, etc.   Non-urgent messages can be sent to your provider as well.   To learn more about what you can do with MyChart, go to [email or web address].    Your next appointment:   9 month(s)  The format for your next appointment:   In Person  Provider:   Jyl Heinz, MD    Other Instructions none  Important Information About Sugar

## 2022-10-27 NOTE — Progress Notes (Signed)
Cardiology Office Note:    Date:  10/27/2022   ID:  [name], DOB 1965/08/14, MRN [id number]  PCP:  Charlott Rakes, MD  Cardiologist:  Jenean Lindau, MD   Referring MD: No ref. provider found    ASSESSMENT:    1. Heart failure with preserved ejection fraction, unspecified HF chronicity ([place])   2. Coronary artery disease involving native coronary artery of native heart without angina pectoris   3. Hypertension associated with diabetes ([place])   4. Diabetes mellitus due to underlying condition with unspecified complications ([place])   5. Mixed hyperlipidemia    PLAN:    In order of problems listed above:  Coronary artery disease: Secondary prevention stressed with the patient.  Importance of compliance with diet medication stressed and he vocalized understanding.  He was advised to walk at least half an hour a day 5 days a week and he promises to do so.  He is not compliant with advice.  He is not on aspirin and does not have nitroglycerin.  I discussed this and advised him and sent prescription. Essential hypertension: Blood pressure stable and diet was emphasized.  Lifestyle modification urged. Mixed dyslipidemia: On lipid-lowering medications.  He will be back in the next few days for fasting blood work.  Will check his lipids and advise him accordingly. Diabetes mellitus: Managed by primary care.  Diet emphasized. Patient will be seen in follow-up appointment in 6 months or earlier if the patient has any concerns.    Medication Adjustments/Labs and Tests Ordered: Current medicines are reviewed at length with the patient today.  Concerns regarding medicines are outlined above.  No orders of the defined types were placed in this encounter.  No orders of the defined types were placed in this encounter.    No chief complaint on file.    History of Present Illness:    Kenneth Rios is a 57 y.o. male.  Patient has past medical history of coronary artery disease,  essential hypertension, mixed dyslipidemia and diabetes mellitus.  He denies any problems at this time and takes care of activities of daily living.  No chest pain orthopnea or PND.  At the time of my evaluation, the patient is alert awake oriented and in no distress.  Overall he leads a sedentary lifestyle.  Past Medical History:  Diagnosis Date   (HFpEF) heart failure with preserved ejection fraction ([place]) 03/30/2018   Accelerated hypertension 06/05/2018   Acute viral bronchitis 08/24/2017   Adhesive capsulitis of left shoulder 08/27/2021   Angina pectoris ([place]) 03/29/2018   Anxiety    Anxiety and depression 02/06/2018   Asthma with status asthmaticus    CAD (coronary artery disease) 12/03/2021   Chest tightness 03/29/2018   CHF (congestive heart failure) ([place])    "fluttering"   Depression    Diabetes mellitus due to underlying condition with unspecified complications ([place]) [id number]   Diabetes mellitus without complication ([place])    Diverticulitis    Diverticulitis large intestine 05/31/2018   GERD (gastroesophageal reflux disease)    Hyperlipidemia    Hypertension    Hypertension associated with diabetes ([place]) 08/24/2017   Hypertensive heart disease with chronic combined systolic and diastolic congestive heart failure ([place]) 08/24/2017   Hypertensive urgency 08/24/2017   Microcytic anemia 12/19/2018   Mixed dyslipidemia 01/09/2020   Peritonitis ([place]) 09/02/2017   Pneumonia 12/16/2018   Polysubstance abuse ([place]) 12/19/2018   Prolonged Q-T interval on ECG 02/06/2018   S/P colostomy ([place]) 09/16/2017   Type 2  diabetes mellitus with hyperglycemia, with long-term current use of insulin ([place]) 08/27/2021   Unstable angina ([place]) 03/29/2018    Past Surgical History:  Procedure Laterality Date   CARDIAC CATHETERIZATION  03/30/2018   COLON RESECTION N/A 09/05/2017   Procedure: HARTMAN'S COLECTOMY AND COLOSTOMY;  Surgeon: Mickeal Skinner, MD;  Location: [facility];  Service: General;   Laterality: N/A;   COLON SURGERY     COLOSTOMY TAKEDOWN N/A 05/31/2018   Procedure: LAPAROSCOPIC COLOSTOMY REVERSAL COLORECTAL ANASTOMOSIS ERAS PATHWAY;  Surgeon: Kieth Brightly [name], MD;  Location: [facility];  Service: General;  Laterality: N/A;   INTRAVASCULAR PRESSURE WIRE/FFR STUDY N/A 03/30/2018   Procedure: INTRAVASCULAR PRESSURE WIRE/FFR STUDY;  Surgeon: Nelva Bush, MD;  Location: [facility];  Service: Cardiovascular;  Laterality: N/A;   LEFT HEART CATH AND CORONARY ANGIOGRAPHY N/A 03/30/2018   Procedure: LEFT HEART CATH AND CORONARY ANGIOGRAPHY;  Surgeon: Nelva Bush, MD;  Location: [facility];  Service: Cardiovascular;  Laterality: N/A;    Current Medications: Current Meds  Medication Sig   Accu-Chek Softclix Lancets lancets Use to check blood sugar three times daily. E11.65   benzonatate (TESSALON) 100 MG capsule Take 1 capsule (100 mg total) by mouth 2 (two) times daily as needed for cough.   Blood Glucose Monitoring Suppl (ACCU-CHEK GUIDE) w/Device KIT Use to check blood sugar three times daily. E11.65   carvedilol (COREG) 6.25 MG tablet Take 1 tablet (6.25 mg total) by mouth 2 (two) times daily with a meal.   cyclobenzaprine (FLEXERIL) 10 MG tablet Take 1 tablet (10 mg total) by mouth 2 (two) times daily as needed for muscle spasms.   furosemide (LASIX) 40 MG tablet Take 1 tablet (40 mg total) by mouth daily.   gabapentin (NEURONTIN) 300 MG capsule Take 2 capsules (600 mg total) by mouth 2 (two) times daily.   glucose blood (ACCU-CHEK GUIDE) test strip Use to check blood sugar three times daily.   Insulin Pen Needle (BD PEN NEEDLE NANO U/F) 32G X 4 MM MISC use to inject insulin once daily   lisinopril (ZESTRIL) 20 MG tablet Take 1 tablet (20 mg total) by mouth daily. (Patient taking differently: Take 10 mg by mouth daily.)   metFORMIN (GLUCOPHAGE) 500 MG tablet Take 2 tablets (1,000 mg total) by mouth 2 (two) times daily with a meal.   rosuvastatin  (CRESTOR) 10 MG tablet Take 1 tablet (10 mg total) by mouth daily.   tamsulosin (FLOMAX) 0.4 MG CAPS capsule Take 1 capsule (0.4 mg total) by mouth daily.     Allergies:   Patient has no known allergies.   Social History   Socioeconomic History   Marital status: Single    Spouse name: Not on file   Number of children: 3   Years of education: Not on file   Highest education level: Not on file  Occupational History   Occupation: Disabled  Tobacco Use   Smoking status: Former   Smokeless tobacco: Never  Vaping Use   Vaping Use: Never used  Substance and Sexual Activity   Alcohol use: Yes    Alcohol/week: 6.0 standard drinks of alcohol    Types: 6 Cans of beer per week    Comment: on the weekends   Drug use: No   Sexual activity: Yes    Partners: Female  Other Topics Concern   Not on file  Social History Narrative   Not on file   Social Determinants of Health   Financial Resource Strain:  Not on file  Food Insecurity: No Food Insecurity (08/23/2022)   Hunger Vital Sign    Worried About Running Out of Food in the Last Year: Never true    Ran Out of Food in the Last Year: Never true  Transportation Needs: Unknown (08/23/2022)   PRAPARE - Hydrologist (Medical): No    Lack of Transportation (Non-Medical): Not on file  Physical Activity: Not on file  Stress: Not on file  Social Connections: Not on file     Family History: The patient's family history includes Diabetes in his maternal aunt and maternal uncle; Heart disease in his maternal aunt, mother, and sister. There is no history of Sudden Cardiac Death.  ROS:   Please see the history of present illness.    All other systems reviewed and are negative.  EKGs/Labs/Other Studies Reviewed:    The following studies were reviewed today: I discussed my findings with the patient at length and angiography report was reviewed.   Recent Labs: 12/03/2021: TSH 1.650 08/20/2022: ALT 11; B  Natriuretic Peptide 167.3; BUN 18; Creatinine, Ser 1.81; Hemoglobin 14.9; Platelets 361; Potassium 3.8; Sodium 136  Recent Lipid Panel    Component Value Date/Time   CHOL 245 (H) 12/03/2021 0858   TRIG 138 12/03/2021 0858   HDL 49 12/03/2021 0858   CHOLHDL 5.0 12/03/2021 0858   CHOLHDL 6.1 03/31/2018 0227   VLDL UNABLE TO CALCULATE IF TRIGLYCERIDE OVER 400 mg/dL 03/31/2018 0227   LDLCALC 171 (H) 12/03/2021 0858    Physical Exam:    VS:  BP 114/66   Pulse 92   Ht 5\' 9"  (1.753 m)   Wt 180 lb 1.3 oz (81.7 kg)   SpO2 94%   BMI 26.59 kg/m     Wt Readings from Last 3 Encounters:  10/27/22 180 lb 1.3 oz (81.7 kg)  10/13/22 179 lb (81.2 kg)  04/26/22 175 lb 12.8 oz (79.7 kg)     GEN: Patient is in no acute distress HEENT: Normal NECK: No JVD; No carotid bruits LYMPHATICS: No lymphadenopathy CARDIAC: Hear sounds regular, 2/6 systolic murmur at the apex. RESPIRATORY:  Clear to auscultation without rales, wheezing or rhonchi  ABDOMEN: Soft, non-tender, non-distended MUSCULOSKELETAL:  No edema; No deformity  SKIN: Warm and dry NEUROLOGIC:  Alert and oriented x 3 PSYCHIATRIC:  Normal affect   Signed, Jenean Lindau, MD  10/27/2022 3:54 PM    [place]

## 2022-11-19 ENCOUNTER — Other Ambulatory Visit: Payer: Self-pay

## 2022-11-19 ENCOUNTER — Other Ambulatory Visit (HOSPITAL_COMMUNITY): Payer: Self-pay

## 2022-11-25 ENCOUNTER — Other Ambulatory Visit (HOSPITAL_COMMUNITY): Payer: Self-pay

## 2022-12-08 ENCOUNTER — Other Ambulatory Visit: Payer: Self-pay | Admitting: Family Medicine

## 2022-12-08 DIAGNOSIS — R051 Acute cough: Secondary | ICD-10-CM

## 2022-12-08 DIAGNOSIS — M7502 Adhesive capsulitis of left shoulder: Secondary | ICD-10-CM

## 2022-12-09 ENCOUNTER — Other Ambulatory Visit (HOSPITAL_COMMUNITY): Payer: Self-pay

## 2022-12-09 ENCOUNTER — Other Ambulatory Visit: Payer: Self-pay

## 2022-12-09 MED ORDER — BENZONATATE 100 MG PO CAPS
100.0000 mg | ORAL_CAPSULE | Freq: Two times a day (BID) | ORAL | 0 refills | Status: DC | PRN
  Filled 2022-12-09: qty 20, 10d supply, fill #0

## 2022-12-09 MED ORDER — CYCLOBENZAPRINE HCL 10 MG PO TABS
10.0000 mg | ORAL_TABLET | Freq: Two times a day (BID) | ORAL | 1 refills | Status: DC | PRN
  Filled 2022-12-09 – 2022-12-20 (×4): qty 60, 30d supply, fill #0
  Filled 2023-01-17 (×2): qty 60, 30d supply, fill #1

## 2022-12-10 ENCOUNTER — Other Ambulatory Visit: Payer: Self-pay

## 2022-12-14 ENCOUNTER — Other Ambulatory Visit: Payer: Self-pay

## 2022-12-20 ENCOUNTER — Other Ambulatory Visit: Payer: Self-pay

## 2023-01-17 ENCOUNTER — Other Ambulatory Visit: Payer: Self-pay

## 2023-01-17 ENCOUNTER — Other Ambulatory Visit (HOSPITAL_COMMUNITY): Payer: Self-pay

## 2023-01-19 ENCOUNTER — Ambulatory Visit: Payer: Medicaid Other | Admitting: Family Medicine

## 2023-02-09 ENCOUNTER — Other Ambulatory Visit (HOSPITAL_COMMUNITY): Payer: Self-pay

## 2023-02-09 ENCOUNTER — Other Ambulatory Visit: Payer: Self-pay

## 2023-02-09 ENCOUNTER — Other Ambulatory Visit: Payer: Self-pay | Admitting: Family Medicine

## 2023-02-09 DIAGNOSIS — M7502 Adhesive capsulitis of left shoulder: Secondary | ICD-10-CM

## 2023-02-09 DIAGNOSIS — R051 Acute cough: Secondary | ICD-10-CM

## 2023-02-09 DIAGNOSIS — E1142 Type 2 diabetes mellitus with diabetic polyneuropathy: Secondary | ICD-10-CM

## 2023-02-09 MED ORDER — GABAPENTIN 300 MG PO CAPS
600.0000 mg | ORAL_CAPSULE | Freq: Two times a day (BID) | ORAL | 3 refills | Status: DC
  Filled 2023-02-09: qty 120, 30d supply, fill #0
  Filled 2023-03-07: qty 120, 30d supply, fill #1
  Filled 2023-04-04: qty 120, 30d supply, fill #2
  Filled 2023-05-10 – 2023-05-16 (×2): qty 120, 30d supply, fill #3

## 2023-02-09 MED ORDER — BENZONATATE 100 MG PO CAPS
100.0000 mg | ORAL_CAPSULE | Freq: Two times a day (BID) | ORAL | 0 refills | Status: DC | PRN
  Filled 2023-02-09: qty 20, 10d supply, fill #0

## 2023-02-09 MED ORDER — CYCLOBENZAPRINE HCL 10 MG PO TABS
10.0000 mg | ORAL_TABLET | Freq: Two times a day (BID) | ORAL | 0 refills | Status: DC | PRN
  Filled 2023-02-09: qty 60, 30d supply, fill #0

## 2023-02-11 ENCOUNTER — Other Ambulatory Visit: Payer: Self-pay

## 2023-03-07 ENCOUNTER — Other Ambulatory Visit: Payer: Self-pay

## 2023-03-07 ENCOUNTER — Other Ambulatory Visit (HOSPITAL_COMMUNITY): Payer: Self-pay

## 2023-03-07 ENCOUNTER — Other Ambulatory Visit: Payer: Self-pay | Admitting: Family Medicine

## 2023-03-07 DIAGNOSIS — M7502 Adhesive capsulitis of left shoulder: Secondary | ICD-10-CM

## 2023-03-08 NOTE — Telephone Encounter (Signed)
Requested medications are due for refill today.  Yes - in 3 days  Requested medications are on the active medications list.  yes  Last refill. 02/09/2023 #60 0 rf  Future visit scheduled.   yes  Notes to clinic.  Refill not delegated.    Requested Prescriptions  Pending Prescriptions Disp Refills   cyclobenzaprine (FLEXERIL) 10 MG tablet 60 tablet 0    Sig: Take 1 tablet (10 mg total) by mouth 2 (two) times daily as needed for muscle spasms.     Not Delegated - Analgesics:  Muscle Relaxants Failed - 03/07/2023  3:08 PM      Failed - This refill cannot be delegated      Passed - Valid encounter within last 6 months    Recent Outpatient Visits           4 months ago Type 2 diabetes mellitus with hyperglycemia, with long-term current use of insulin (HCC)   [place] [place], [place], MD   10 months ago Type 2 diabetes mellitus with hyperglycemia, with long-term current use of insulin (HCC)   [facility] [place], [place], MD   1 year ago Type 2 diabetes mellitus with hyperglycemia, with long-term current use of insulin (HCC)   [facility] [place], Odette Horns, MD   1 year ago Type 2 diabetes mellitus with hyperglycemia, with long-term current use of insulin [facility])    [facility] [place], Odette Horns, MD   2 years ago Acute non-recurrent sinusitis of other sinus    [facility] Hoy Register, MD       Future Appointments             In 1 month Hoy Register, MD [facility]

## 2023-03-09 ENCOUNTER — Other Ambulatory Visit (HOSPITAL_COMMUNITY): Payer: Self-pay

## 2023-03-09 MED ORDER — CYCLOBENZAPRINE HCL 10 MG PO TABS
10.0000 mg | ORAL_TABLET | Freq: Two times a day (BID) | ORAL | 0 refills | Status: DC | PRN
  Filled 2023-03-09: qty 60, 30d supply, fill #0

## 2023-03-10 ENCOUNTER — Other Ambulatory Visit: Payer: Self-pay

## 2023-03-16 ENCOUNTER — Other Ambulatory Visit (HOSPITAL_COMMUNITY): Payer: Self-pay

## 2023-03-16 ENCOUNTER — Other Ambulatory Visit: Payer: Self-pay

## 2023-03-16 ENCOUNTER — Other Ambulatory Visit: Payer: Self-pay | Admitting: Family Medicine

## 2023-03-16 DIAGNOSIS — E1165 Type 2 diabetes mellitus with hyperglycemia: Secondary | ICD-10-CM

## 2023-03-16 MED ORDER — ACCU-CHEK GUIDE VI STRP
1.0000 | ORAL_STRIP | Freq: Three times a day (TID) | 0 refills | Status: DC
  Filled 2023-03-16: qty 100, 34d supply, fill #0

## 2023-03-16 MED ORDER — BD PEN NEEDLE NANO U/F 32G X 4 MM MISC
0 refills | Status: DC
  Filled 2023-03-16: qty 100, 34d supply, fill #0

## 2023-03-16 MED ORDER — ACCU-CHEK SOFTCLIX LANCETS MISC
0 refills | Status: DC
Start: 2023-03-16 — End: 2023-04-25
  Filled 2023-03-16: qty 100, 33d supply, fill #0

## 2023-03-17 ENCOUNTER — Other Ambulatory Visit: Payer: Self-pay

## 2023-03-28 ENCOUNTER — Other Ambulatory Visit: Payer: Self-pay | Admitting: Family Medicine

## 2023-03-28 ENCOUNTER — Other Ambulatory Visit: Payer: Self-pay

## 2023-03-28 DIAGNOSIS — M7502 Adhesive capsulitis of left shoulder: Secondary | ICD-10-CM

## 2023-03-29 ENCOUNTER — Other Ambulatory Visit (HOSPITAL_COMMUNITY): Payer: Self-pay

## 2023-03-29 ENCOUNTER — Other Ambulatory Visit: Payer: Self-pay

## 2023-03-29 MED ORDER — CYCLOBENZAPRINE HCL 10 MG PO TABS
10.0000 mg | ORAL_TABLET | Freq: Two times a day (BID) | ORAL | 0 refills | Status: DC | PRN
  Filled 2023-03-29 – 2023-04-04 (×2): qty 60, 30d supply, fill #0

## 2023-03-29 NOTE — Telephone Encounter (Signed)
Requested medication (s) are due for refill today - no  Requested medication (s) are on the active medication list -yes  Future visit scheduled -yes  Last refill: 03/09/23 #60  Notes to clinic: non delegated Rx  Requested Prescriptions  Pending Prescriptions Disp Refills   cyclobenzaprine (FLEXERIL) 10 MG tablet 60 tablet 0    Sig: Take 1 tablet (10 mg total) by mouth 2 (two) times daily as needed for muscle spasms.     Not Delegated - Analgesics:  Muscle Relaxants Failed - 03/28/2023 12:44 AM      Failed - This refill cannot be delegated      Passed - Valid encounter within last 6 months    Recent Outpatient Visits           5 months ago Type 2 diabetes mellitus with hyperglycemia, with long-term current use of insulin (HCC)   [place] [facility] [place], [place], MD   11 months ago Type 2 diabetes mellitus with hyperglycemia, with long-term current use of insulin (HCC)   [place] [facility] [place], [place], MD   1 year ago Type 2 diabetes mellitus with hyperglycemia, with long-term current use of insulin (HCC)   [place] [facility] [place], [place], MD   1 year ago Type 2 diabetes mellitus with hyperglycemia, with long-term current use of insulin (HCC)   [place] [facility] [place], Odette Horns, MD   2 years ago Acute non-recurrent sinusitis of other sinus   [facility] [place], Odette Horns, MD       Future Appointments             In 1 month Hoy Register, MD [place] [facility]               Requested Prescriptions  Pending Prescriptions Disp Refills   cyclobenzaprine (FLEXERIL) 10 MG tablet 60 tablet 0    Sig: Take 1 tablet (10 mg total) by mouth 2 (two) times daily as needed for muscle spasms.     Not Delegated - Analgesics:  Muscle Relaxants Failed - 03/28/2023 12:44 AM      Failed - This refill  cannot be delegated      Passed - Valid encounter within last 6 months    Recent Outpatient Visits           5 months ago Type 2 diabetes mellitus with hyperglycemia, with long-term current use of insulin (HCC)   [place] [facility] [place], [place], MD   11 months ago Type 2 diabetes mellitus with hyperglycemia, with long-term current use of insulin (HCC)   [facility] [place], [place], MD   1 year ago Type 2 diabetes mellitus with hyperglycemia, with long-term current use of insulin (HCC)   [facility] [place], Odette Horns, MD   1 year ago Type 2 diabetes mellitus with hyperglycemia, with long-term current use of insulin [facility])   [place] [facility] [place], Odette Horns, MD   2 years ago Acute non-recurrent sinusitis of other sinus   [place] Hoy Register, MD       Future Appointments             In 1 month Hoy Register, MD [address] [facility]

## 2023-04-04 ENCOUNTER — Other Ambulatory Visit (HOSPITAL_COMMUNITY): Payer: Self-pay

## 2023-04-11 ENCOUNTER — Other Ambulatory Visit: Payer: Self-pay | Admitting: Family Medicine

## 2023-04-11 DIAGNOSIS — I152 Hypertension secondary to endocrine disorders: Secondary | ICD-10-CM

## 2023-04-11 DIAGNOSIS — E1165 Type 2 diabetes mellitus with hyperglycemia: Secondary | ICD-10-CM

## 2023-04-11 DIAGNOSIS — I110 Hypertensive heart disease with heart failure: Secondary | ICD-10-CM

## 2023-04-12 ENCOUNTER — Other Ambulatory Visit (HOSPITAL_COMMUNITY): Payer: Self-pay

## 2023-04-12 MED ORDER — CARVEDILOL 6.25 MG PO TABS
6.2500 mg | ORAL_TABLET | Freq: Two times a day (BID) | ORAL | 0 refills | Status: AC
Start: 2023-04-12 — End: ?
  Filled 2023-04-12: qty 60, 30d supply, fill #0

## 2023-04-12 MED ORDER — LISINOPRIL 20 MG PO TABS
20.0000 mg | ORAL_TABLET | Freq: Every day | ORAL | 0 refills | Status: AC
Start: 2023-04-12 — End: ?
  Filled 2023-04-12: qty 30, 30d supply, fill #0

## 2023-04-12 MED ORDER — FUROSEMIDE 40 MG PO TABS
40.0000 mg | ORAL_TABLET | Freq: Every day | ORAL | 0 refills | Status: AC
Start: 2023-04-12 — End: ?
  Filled 2023-04-12: qty 30, 30d supply, fill #0

## 2023-04-12 MED ORDER — ROSUVASTATIN CALCIUM 10 MG PO TABS
10.0000 mg | ORAL_TABLET | Freq: Every day | ORAL | 0 refills | Status: DC
  Filled 2023-04-12: qty 30, 30d supply, fill #0

## 2023-04-12 MED ORDER — METFORMIN HCL 500 MG PO TABS
1000.0000 mg | ORAL_TABLET | Freq: Two times a day (BID) | ORAL | 0 refills | Status: AC
Start: 2023-04-12 — End: ?
  Filled 2023-04-12: qty 120, 30d supply, fill #0

## 2023-04-14 ENCOUNTER — Other Ambulatory Visit: Payer: Self-pay | Admitting: Family Medicine

## 2023-04-14 ENCOUNTER — Other Ambulatory Visit (HOSPITAL_COMMUNITY): Payer: Self-pay

## 2023-04-14 ENCOUNTER — Other Ambulatory Visit: Payer: Self-pay

## 2023-04-14 DIAGNOSIS — R051 Acute cough: Secondary | ICD-10-CM

## 2023-04-14 MED ORDER — BENZONATATE 100 MG PO CAPS
100.0000 mg | ORAL_CAPSULE | Freq: Two times a day (BID) | ORAL | 0 refills | Status: DC | PRN
  Filled 2023-04-14: qty 20, 10d supply, fill #0

## 2023-04-15 ENCOUNTER — Other Ambulatory Visit: Payer: Self-pay

## 2023-04-25 ENCOUNTER — Other Ambulatory Visit: Payer: Self-pay | Admitting: Family Medicine

## 2023-04-25 ENCOUNTER — Other Ambulatory Visit (HOSPITAL_COMMUNITY): Payer: Self-pay

## 2023-04-25 ENCOUNTER — Other Ambulatory Visit: Payer: Self-pay

## 2023-04-25 DIAGNOSIS — E1165 Type 2 diabetes mellitus with hyperglycemia: Secondary | ICD-10-CM

## 2023-04-26 ENCOUNTER — Other Ambulatory Visit: Payer: Self-pay

## 2023-04-26 MED ORDER — ACCU-CHEK GUIDE VI STRP
1.0000 | ORAL_STRIP | Freq: Three times a day (TID) | 0 refills | Status: AC
  Filled 2023-04-26: qty 100, 34d supply, fill #0

## 2023-04-26 MED ORDER — TECHLITE PLUS PEN NEEDLES 32G X 4 MM MISC
0 refills | Status: DC
  Filled 2023-04-26: qty 100, 34d supply, fill #0

## 2023-04-26 MED ORDER — ACCU-CHEK SOFTCLIX LANCETS MISC
0 refills | Status: DC
Start: 2023-04-26 — End: 2023-11-16
  Filled 2023-04-26: qty 100, 30d supply, fill #0

## 2023-04-26 NOTE — Telephone Encounter (Signed)
Requested Prescriptions  Pending Prescriptions Disp Refills   Insulin Pen Needle (TECHLITE PLUS PEN NEEDLES) 32G X 4 MM MISC 100 each 0    Sig: use to inject insulin once daily     Endocrinology: Diabetes - Testing Supplies Passed - 04/25/2023  9:31 AM      Passed - Valid encounter within last 12 months    Recent Outpatient Visits           6 months ago Type 2 diabetes mellitus with hyperglycemia, with long-term current use of insulin (HCC)   [place] [facility] [place], [place], MD   1 year ago Type 2 diabetes mellitus with hyperglycemia, with long-term current use of insulin (HCC)   [place] [facility] [place], [place], MD   1 year ago Type 2 diabetes mellitus with hyperglycemia, with long-term current use of insulin (HCC)   [place] [place], [place], MD   1 year ago Type 2 diabetes mellitus with hyperglycemia, with long-term current use of insulin (HCC)   [place] [facility] [place], Odette Horns, MD   2 years ago Acute non-recurrent sinusitis of other sinus   [place] [facility] Hoy Register, MD       Future Appointments             In 6 days Hoy Register, MD [facility]             Accu-Chek Softclix Lancets lancets 100 each 0    Sig: Use to check blood sugar three times daily. E11.65     Endocrinology: Diabetes - Testing Supplies Passed - 04/25/2023  9:31 AM      Passed - Valid encounter within last 12 months    Recent Outpatient Visits           6 months ago Type 2 diabetes mellitus with hyperglycemia, with long-term current use of insulin (HCC)   [facility] [place], [place], MD   1 year ago Type 2 diabetes mellitus with hyperglycemia, with long-term current use of insulin (HCC)   [facility] [place], Odette Horns, MD   1 year  ago Type 2 diabetes mellitus with hyperglycemia, with long-term current use of insulin (HCC)   [facility] [place], Odette Horns, MD   1 year ago Type 2 diabetes mellitus with hyperglycemia, with long-term current use of insulin (HCC)   [facility] [place], Odette Horns, MD   2 years ago Acute non-recurrent sinusitis of other sinus   [place] [facility] Hoy Register, MD       Future Appointments             In 6 days Hoy Register, MD [place] [facility]             glucose blood (ACCU-CHEK GUIDE) test strip 100 each 0    Sig: Use to check blood sugar three times daily.     Endocrinology: Diabetes - Testing Supplies Passed - 04/25/2023  9:31 AM      Passed - Valid encounter within last 12 months    Recent Outpatient Visits           6 months ago Type 2 diabetes mellitus with hyperglycemia, with long-term current use of insulin (HCC)   [place]  [facility] [place], [place], MD   1 year ago Type 2 diabetes mellitus with hyperglycemia, with long-term current use of insulin [facility])   [place] [facility] [place], [place], MD   1 year ago Type 2 diabetes mellitus with hyperglycemia, with long-term current use of insulin [facility])   [place] [facility] [place], Odette Horns, MD   1 year ago Type 2 diabetes mellitus with hyperglycemia, with long-term current use of insulin [facility])   [place] [place], Odette Horns, MD   2 years ago Acute non-recurrent sinusitis of other sinus   [place] [facility] Hoy Register, MD       Future Appointments             In 6 days Hoy Register, MD [facility]

## 2023-04-27 ENCOUNTER — Other Ambulatory Visit: Payer: Self-pay

## 2023-04-28 ENCOUNTER — Other Ambulatory Visit (HOSPITAL_COMMUNITY): Payer: Self-pay

## 2023-04-28 ENCOUNTER — Other Ambulatory Visit: Payer: Self-pay | Admitting: Family Medicine

## 2023-04-28 ENCOUNTER — Other Ambulatory Visit: Payer: Self-pay

## 2023-04-28 MED ORDER — BD PEN NEEDLE MINI U/F 31G X 5 MM MISC
1.0000 | Freq: Every day | 3 refills | Status: DC
Start: 2023-04-28 — End: 2023-12-20
  Filled 2023-04-28: qty 100, 34d supply, fill #0
  Filled 2023-04-28: qty 100, fill #0
  Filled 2023-06-09: qty 100, 34d supply, fill #1
  Filled 2023-07-11: qty 100, 34d supply, fill #2
  Filled 2023-08-12: qty 100, 34d supply, fill #3

## 2023-05-02 ENCOUNTER — Ambulatory Visit: Payer: Medicaid Other | Admitting: Family Medicine

## 2023-05-06 ENCOUNTER — Other Ambulatory Visit: Payer: Self-pay | Admitting: Family Medicine

## 2023-05-06 ENCOUNTER — Other Ambulatory Visit (HOSPITAL_COMMUNITY): Payer: Self-pay

## 2023-05-06 DIAGNOSIS — Z794 Long term (current) use of insulin: Secondary | ICD-10-CM

## 2023-05-06 DIAGNOSIS — M7502 Adhesive capsulitis of left shoulder: Secondary | ICD-10-CM

## 2023-05-06 DIAGNOSIS — I5042 Chronic combined systolic (congestive) and diastolic (congestive) heart failure: Secondary | ICD-10-CM

## 2023-05-06 DIAGNOSIS — E1159 Type 2 diabetes mellitus with other circulatory complications: Secondary | ICD-10-CM

## 2023-05-06 NOTE — Telephone Encounter (Signed)
Requested medications are due for refill today.  yes  Requested medications are on the active medications list.  yes  Last refill. 04/12/2023 for all but cyclobenzaprine which was refilled 03/29/2023  Future visit scheduled.   yes  Notes to clinic.  Pt has no showed last 2 scheduled appts.. Pt given courtesy refills for meds. Please review for refill.    Requested Prescriptions  Pending Prescriptions Disp Refills   cyclobenzaprine (FLEXERIL) 10 MG tablet 60 tablet 0    Sig: Take 1 tablet (10 mg total) by mouth 2 (two) times daily as needed for muscle spasms.     Not Delegated - Analgesics:  Muscle Relaxants Failed - 05/06/2023  9:23 AM      Failed - This refill cannot be delegated      Failed - Valid encounter within last 6 months    Recent Outpatient Visits           6 months ago Type 2 diabetes mellitus with hyperglycemia, with long-term current use of insulin (HCC)   [facility] [place], Odette Horns, MD   1 year ago Type 2 diabetes mellitus with hyperglycemia, with long-term current use of insulin (HCC)   [facility] [place], Odette Horns, MD   1 year ago Type 2 diabetes mellitus with hyperglycemia, with long-term current use of insulin (HCC)   [facility] [place], Odette Horns, MD   1 year ago Type 2 diabetes mellitus with hyperglycemia, with long-term current use of insulin (HCC)   [place] [facility] Center [place], Odette Horns, MD   2 years ago Acute non-recurrent sinusitis of other sinus   [place] [facility] [place], Odette Horns, MD       Future Appointments             In 2 months Hoy Register, MD [facility]             carvedilol (COREG) 6.25 MG tablet 60 tablet 0    Sig: Take 1 tablet (6.25 mg total) by mouth 2 (two) times daily with a meal.     Cardiovascular: Beta Blockers 3 Failed - 05/06/2023   9:23 AM      Failed - Cr in normal range and within 360 days    Creatinine  Date Value Ref Range Status  03/16/2014 1.00 0.60 - 1.30 mg/dL Final   Creatinine, Ser  Date Value Ref Range Status  08/20/2022 1.81 (H) 0.61 - 1.24 mg/dL Final         Failed - Valid encounter within last 6 months    Recent Outpatient Visits           6 months ago Type 2 diabetes mellitus with hyperglycemia, with long-term current use of insulin (HCC)   [facility] [place], Odette Horns, MD   1 year ago Type 2 diabetes mellitus with hyperglycemia, with long-term current use of insulin (HCC)   [facility] [place], [place], MD   1 year ago Type 2 diabetes mellitus with hyperglycemia, with long-term current use of insulin (HCC)   [place] [facility] [place], Roman [place], MD   1 year ago Type 2 diabetes mellitus with hyperglycemia, with long-term current use of insulin [facility])   [place] [facility] [place], Odette Horns, MD   2 years ago Acute non-recurrent sinusitis of  other sinus   [place] [facility] Hoy Register, MD       Future Appointments             In 2 months Hoy Register, MD [facility]            Passed - AST in normal range and within 360 days    AST  Date Value Ref Range Status  08/20/2022 20 15 - 41 U/L Final   SGOT(AST)  Date Value Ref Range Status  03/13/2014 18 15 - 37 Unit/L Final         Passed - ALT in normal range and within 360 days    ALT  Date Value Ref Range Status  08/20/2022 11 0 - 44 U/L Final   SGPT (ALT)  Date Value Ref Range Status  03/13/2014 27 U/L Final    Comment:    14-63 NOTE: New Reference Range 02/26/14          Passed - Last BP in normal range    BP Readings from Last 1 Encounters:  10/27/22 114/66         Passed - Last Heart Rate in normal range    Pulse Readings  from Last 1 Encounters:  10/27/22 92          furosemide (LASIX) 40 MG tablet 30 tablet 0    Sig: Take 1 tablet (40 mg total) by mouth daily.     Cardiovascular:  Diuretics - Loop Failed - 05/06/2023  9:23 AM      Failed - K in normal range and within 180 days    Potassium  Date Value Ref Range Status  08/20/2022 3.8 3.5 - 5.1 mmol/L Final  03/16/2014 4.1 3.5 - 5.1 mmol/L Final         Failed - Ca in normal range and within 180 days    Calcium  Date Value Ref Range Status  08/20/2022 9.3 8.9 - 10.3 mg/dL Final   Calcium, Total  Date Value Ref Range Status  03/16/2014 8.5 8.5 - 10.1 mg/dL Final         Failed - Na in normal range and within 180 days    Sodium  Date Value Ref Range Status  08/20/2022 136 135 - 145 mmol/L Final  04/26/2022 137 134 - 144 mmol/L Final  03/16/2014 133 (L) 136 - 145 mmol/L Final         Failed - Cr in normal range and within 180 days    Creatinine  Date Value Ref Range Status  03/16/2014 1.00 0.60 - 1.30 mg/dL Final   Creatinine, Ser  Date Value Ref Range Status  08/20/2022 1.81 (H) 0.61 - 1.24 mg/dL Final         Failed - Cl in normal range and within 180 days    Chloride  Date Value Ref Range Status  08/20/2022 101 98 - 111 mmol/L Final  03/16/2014 100 98 - 107 mmol/L Final         Failed - Mg Level in normal range and within 180 days    Magnesium  Date Value Ref Range Status  06/05/2018 1.7 1.7 - 2.4 mg/dL Final    Comment:    Performed at [facility], [address], [place], [place] [address]         Failed - Valid encounter within last 6 months    Recent Outpatient Visits  6 months ago Type 2 diabetes mellitus with hyperglycemia, with long-term current use of insulin (HCC)   [place] [facility] [place], [place], MD   1 year ago Type 2 diabetes mellitus with hyperglycemia, with long-term current use of insulin (HCC)   [place] [facility] [place],  Odette Horns, MD   1 year ago Type 2 diabetes mellitus with hyperglycemia, with long-term current use of insulin (HCC)   [place] [place], Odette Horns, MD   1 year ago Type 2 diabetes mellitus with hyperglycemia, with long-term current use of insulin (HCC)   [place] [facility] [place], Odette Horns, MD   2 years ago Acute non-recurrent sinusitis of other sinus   [facility] [facility] Hoy Register, MD       Future Appointments             In 2 months Hoy Register, MD [facility]            Passed - Last BP in normal range    BP Readings from Last 1 Encounters:  10/27/22 114/66          lisinopril (ZESTRIL) 20 MG tablet 30 tablet 0    Sig: Take 1 tablet (20 mg total) by mouth daily.     Cardiovascular:  ACE Inhibitors Failed - 05/06/2023  9:23 AM      Failed - Cr in normal range and within 180 days    Creatinine  Date Value Ref Range Status  03/16/2014 1.00 0.60 - 1.30 mg/dL Final   Creatinine, Ser  Date Value Ref Range Status  08/20/2022 1.81 (H) 0.61 - 1.24 mg/dL Final         Failed - K in normal range and within 180 days    Potassium  Date Value Ref Range Status  08/20/2022 3.8 3.5 - 5.1 mmol/L Final  03/16/2014 4.1 3.5 - 5.1 mmol/L Final         Failed - Valid encounter within last 6 months    Recent Outpatient Visits           6 months ago Type 2 diabetes mellitus with hyperglycemia, with long-term current use of insulin (HCC)   [place] [facility] Center [place], Odette Horns, MD   1 year ago Type 2 diabetes mellitus with hyperglycemia, with long-term current use of insulin (HCC)   [place] [facility] & Wellness Center [place], Odette Horns, MD   1 year ago Type 2 diabetes mellitus with hyperglycemia, with long-term current use of insulin (HCC)   [facility] [place], [place], MD   1 year ago  Type 2 diabetes mellitus with hyperglycemia, with long-term current use of insulin [facility])   [place] [facility]ness Center [place], Odette Horns, MD   2 years ago Acute non-recurrent sinusitis of other sinus    [facility] Hoy Register, MD       Future Appointments             In 2 months Hoy Register, MD [facility]            Passed - Patient is not pregnant      Passed - Last BP in normal range    BP Readings from Last 1 Encounters:  10/27/22 114/66  metFORMIN (GLUCOPHAGE) 500 MG tablet 120 tablet 0    Sig: Take 2 tablets (1,000 mg total) by mouth 2 (two) times daily with a meal.     Endocrinology:  Diabetes - Biguanides Failed - 05/06/2023  9:23 AM      Failed - Cr in normal range and within 360 days    Creatinine  Date Value Ref Range Status  03/16/2014 1.00 0.60 - 1.30 mg/dL Final   Creatinine, Ser  Date Value Ref Range Status  08/20/2022 1.81 (H) 0.61 - 1.24 mg/dL Final         Failed - HBA1C is between 0 and 7.9 and within 180 days    Hemoglobin A1C  Date Value Ref Range Status  03/15/2014 7.1 (H) 4.2 - 6.3 % Final    Comment:    The American Diabetes Association recommends that a primary goal of therapy should be <7% and that physicians should reevaluate the treatment regimen in patients with HbA1c values consistently >8%.    HbA1c, POC (controlled diabetic range)  Date Value Ref Range Status  10/13/2022 7.2 (A) 0.0 - 7.0 % Final         Failed - eGFR in normal range and within 360 days    EGFR (African American)  Date Value Ref Range Status  03/16/2014 >60  Final   GFR calc Af Amer  Date Value Ref Range Status  05/19/2020 84 >59 mL/min/1.73 Final    Comment:    **[facility] currently reports eGFR in compliance with the current**   recommendations of the [employer]. [facility] will   update reporting as new guidelines are published from the  NKF-ASN   Task force.    EGFR (Non-African Amer.)  Date Value Ref Range Status  03/16/2014 >60  Final    Comment:    eGFR values <56mL/min/1.73 m2 may be an indication of chronic kidney disease (CKD). Calculated eGFR is useful in patients with stable renal function. The eGFR calculation will not be reliable in acutely ill patients when serum creatinine is changing rapidly. It is not useful in  patients on dialysis. The eGFR calculation may not be applicable to patients at the low and high extremes of body sizes, pregnant women, and vegetarians.    GFR, Estimated  Date Value Ref Range Status  08/20/2022 43 (L) >60 mL/min Final    Comment:    (NOTE) Calculated using the CKD-EPI Creatinine Equation (2021)    eGFR  Date Value Ref Range Status  04/26/2022 79 >59 mL/min/1.73 Final         Failed - B12 Level in normal range and within 720 days    No results found for: "VITAMINB12"       Failed - Valid encounter within last 6 months    Recent Outpatient Visits           6 months ago Type 2 diabetes mellitus with hyperglycemia, with long-term current use of insulin (HCC)   [place] Center [place], [place], MD   1 year ago Type 2 diabetes mellitus with hyperglycemia, with long-term current use of insulin (HCC)   [place] [facility] [place], [place], MD   1 year ago Type 2 diabetes mellitus with hyperglycemia, with long-term current use of insulin [facility])   [place] [facility] [place], Odette Horns, MD   1 year ago Type 2 diabetes mellitus with hyperglycemia, with long-term current use of insulin (HCC)   [place]  [facility] [place], [place], MD   2 years ago Acute non-recurrent sinusitis of other sinus   [place] [facility] & [facility]ness Center [place], Odette Horns, MD       Future Appointments             In 2 months Hoy Register, MD [facility]  [facility]            Passed - CBC within normal limits and completed in the last 12 months    WBC  Date Value Ref Range Status  08/20/2022 5.8 4.0 - 10.5 K/uL Final   RBC  Date Value Ref Range Status  08/20/2022 5.05 4.22 - 5.81 MIL/uL Final   Hemoglobin  Date Value Ref Range Status  08/20/2022 14.9 13.0 - 17.0 g/dL Final  52/84/1324 40.1 13.0 - 17.7 g/dL Final   HCT  Date Value Ref Range Status  08/20/2022 42.5 39.0 - 52.0 % Final   Hematocrit  Date Value Ref Range Status  12/03/2021 40.0 37.5 - 51.0 % Final   MCHC  Date Value Ref Range Status  08/20/2022 35.1 30.0 - 36.0 g/dL Final   [facility]  Date Value Ref Range Status  08/20/2022 29.5 26.0 - 34.0 pg Final   MCV  Date Value Ref Range Status  08/20/2022 84.2 80.0 - 100.0 fL Final  12/03/2021 84 79 - 97 fL Final  03/14/2014 82 80 - 100 fL Final   No results found for: "PLTCOUNTKUC", "LABPLAT", "POCPLA" RDW  Date Value Ref Range Status  08/20/2022 12.1 11.5 - 15.5 % Final  12/03/2021 13.1 11.6 - 15.4 % Final  03/14/2014 13.5 11.5 - 14.5 % Final          rosuvastatin (CRESTOR) 10 MG tablet 30 tablet 0    Sig: Take 1 tablet (10 mg total) by mouth daily.     Cardiovascular:  Antilipid - Statins 2 Failed - 05/06/2023  9:23 AM      Failed - Cr in normal range and within 360 days    Creatinine  Date Value Ref Range Status  03/16/2014 1.00 0.60 - 1.30 mg/dL Final   Creatinine, Ser  Date Value Ref Range Status  08/20/2022 1.81 (H) 0.61 - 1.24 mg/dL Final         Failed - Lipid Panel in normal range within the last 12 months    Cholesterol, Total  Date Value Ref Range Status  12/03/2021 245 (H) 100 - 199 mg/dL Final   LDL Chol Calc (NIH)  Date Value Ref Range Status  12/03/2021 171 (H) 0 - 99 mg/dL Final   HDL  Date Value Ref Range Status  12/03/2021 49 >39 mg/dL Final   Triglycerides  Date Value Ref Range Status  12/03/2021 138 0 - 149 mg/dL Final         Passed - Patient is not pregnant       Passed - Valid encounter within last 12 months    Recent Outpatient Visits           6 months ago Type 2 diabetes mellitus with hyperglycemia, with long-term current use of insulin (HCC)   [place] [facility] Center [place], [place], MD   1 year ago Type 2 diabetes mellitus with hyperglycemia, with long-term current use of insulin [facility])   [place] [facility] [place], Odette Horns, MD   1 year ago Type 2 diabetes mellitus with hyperglycemia, with long-term current use of insulin (  HCC)   [facility] [place], [place], MD   1 year ago Type 2 diabetes mellitus with hyperglycemia, with long-term current use of insulin [facility])   [place] [facility] [place], Odette Horns, MD   2 years ago Acute non-recurrent sinusitis of other sinus   [place] [facility] Center Hoy Register, MD       Future Appointments             In 2 months Hoy Register, MD [facility]

## 2023-05-09 ENCOUNTER — Other Ambulatory Visit (HOSPITAL_COMMUNITY): Payer: Self-pay

## 2023-05-09 MED ORDER — FUROSEMIDE 40 MG PO TABS
40.0000 mg | ORAL_TABLET | Freq: Every day | ORAL | 0 refills | Status: DC
Start: 2023-05-09 — End: 2023-07-11
  Filled 2023-05-09: qty 30, 30d supply, fill #0

## 2023-05-09 MED ORDER — ROSUVASTATIN CALCIUM 10 MG PO TABS
10.0000 mg | ORAL_TABLET | Freq: Every day | ORAL | 0 refills | Status: DC
  Filled 2023-05-09: qty 30, 30d supply, fill #0

## 2023-05-09 MED ORDER — CARVEDILOL 6.25 MG PO TABS
6.2500 mg | ORAL_TABLET | Freq: Two times a day (BID) | ORAL | 0 refills | Status: DC
  Filled 2023-05-09: qty 60, 30d supply, fill #0

## 2023-05-09 MED ORDER — LISINOPRIL 20 MG PO TABS
20.0000 mg | ORAL_TABLET | Freq: Every day | ORAL | 0 refills | Status: DC
  Filled 2023-05-09: qty 30, 30d supply, fill #0

## 2023-05-09 MED ORDER — METFORMIN HCL 500 MG PO TABS
1000.0000 mg | ORAL_TABLET | Freq: Two times a day (BID) | ORAL | 0 refills | Status: DC
  Filled 2023-05-09: qty 120, 30d supply, fill #0

## 2023-05-09 MED ORDER — CYCLOBENZAPRINE HCL 10 MG PO TABS
10.0000 mg | ORAL_TABLET | Freq: Two times a day (BID) | ORAL | 0 refills | Status: DC | PRN
  Filled 2023-05-09: qty 60, 30d supply, fill #0

## 2023-05-10 ENCOUNTER — Other Ambulatory Visit (HOSPITAL_COMMUNITY): Payer: Self-pay

## 2023-05-10 ENCOUNTER — Other Ambulatory Visit: Payer: Self-pay

## 2023-05-10 ENCOUNTER — Encounter (HOSPITAL_COMMUNITY): Payer: Self-pay

## 2023-05-13 ENCOUNTER — Other Ambulatory Visit: Payer: Self-pay

## 2023-05-16 ENCOUNTER — Other Ambulatory Visit: Payer: Self-pay

## 2023-05-22 ENCOUNTER — Other Ambulatory Visit: Payer: Self-pay | Admitting: Family Medicine

## 2023-05-22 DIAGNOSIS — R051 Acute cough: Secondary | ICD-10-CM

## 2023-05-23 NOTE — Telephone Encounter (Signed)
Requested by interface surescripts. Last dispensed 04/15/23 # 20  Too soon for refill. Requested Prescriptions  Refused Prescriptions Disp Refills   benzonatate (TESSALON) 100 MG capsule 20 capsule 0    Sig: Take 1 capsule (100 mg total) by mouth 2 (two) times daily as needed for cough.     Ear, Nose, and Throat:  Antitussives/Expectorants Passed - 05/22/2023 11:03 PM      Passed - Valid encounter within last 12 months    Recent Outpatient Visits           7 months ago Type 2 diabetes mellitus with hyperglycemia, with long-term current use of insulin (HCC)   [place] [facility] Center [place], [place], MD   1 year ago Type 2 diabetes mellitus with hyperglycemia, with long-term current use of insulin (HCC)   [place] [facility] [place], Odette Horns, MD   1 year ago Type 2 diabetes mellitus with hyperglycemia, with long-term current use of insulin (HCC)   [place] [facility] [place], Odette Horns, MD   2 years ago Type 2 diabetes mellitus with hyperglycemia, with long-term current use of insulin (HCC)   [place] [facility] [place], Odette Horns, MD   2 years ago Acute non-recurrent sinusitis of other sinus   [place] [facility] Center Hoy Register, MD       Future Appointments             In 2 months Revankar, Aundra Dubin, MD  [facility]   In 2 months Hoy Register, MD [facility]

## 2023-05-30 ENCOUNTER — Other Ambulatory Visit: Payer: Self-pay | Admitting: Family Medicine

## 2023-05-30 DIAGNOSIS — Z794 Long term (current) use of insulin: Secondary | ICD-10-CM

## 2023-05-30 DIAGNOSIS — R051 Acute cough: Secondary | ICD-10-CM

## 2023-05-31 ENCOUNTER — Other Ambulatory Visit (HOSPITAL_COMMUNITY): Payer: Self-pay

## 2023-05-31 MED ORDER — BENZONATATE 100 MG PO CAPS
100.0000 mg | ORAL_CAPSULE | Freq: Two times a day (BID) | ORAL | 0 refills | Status: DC | PRN
Start: 2023-05-31 — End: 2023-06-24
  Filled 2023-05-31: qty 20, 10d supply, fill #0

## 2023-05-31 MED ORDER — LANTUS SOLOSTAR 100 UNIT/ML ~~LOC~~ SOPN
20.0000 [IU] | PEN_INJECTOR | Freq: Every day | SUBCUTANEOUS | 0 refills | Status: DC
  Filled 2023-05-31: qty 15, 75d supply, fill #0

## 2023-05-31 NOTE — Telephone Encounter (Signed)
Requested medication (s) are due for refill today: yes  Requested medication (s) are on the active medication list: yes  Last refill:  multiple dates  Future visit scheduled: yes  Notes to clinic:  routing for review     Requested Prescriptions  Pending Prescriptions Disp Refills   benzonatate (TESSALON) 100 MG capsule 20 capsule 0    Sig: Take 1 capsule (100 mg total) by mouth 2 (two) times daily as needed for cough.     Ear, Nose, and Throat:  Antitussives/Expectorants Passed - 05/30/2023  6:53 PM      Passed - Valid encounter within last 12 months    Recent Outpatient Visits           7 months ago Type 2 diabetes mellitus with hyperglycemia, with long-term current use of insulin (HCC)   [place] Center [place], [place], MD   1 year ago Type 2 diabetes mellitus with hyperglycemia, with long-term current use of insulin (HCC)   [place] [facility] [place], [place], MD   1 year ago Type 2 diabetes mellitus with hyperglycemia, with long-term current use of insulin (HCC)   [place] [facility] [place], Odette Horns, MD   2 years ago Type 2 diabetes mellitus with hyperglycemia, with long-term current use of insulin (HCC)   [place] [facility] Center [place], Odette Horns, MD   2 years ago Acute non-recurrent sinusitis of other sinus   [place] [facility] Hoy Register, MD       Future Appointments             In 1 month Revankar, Aundra Dubin, MD [place] [facility]   In 1 month Hoy Register, MD [place] [facility]             insulin glargine (LANTUS SOLOSTAR) 100 UNIT/ML Solostar Pen 30 mL 6    Sig: Inject 20 Units into the skin at bedtime.     Endocrinology:  Diabetes - Insulins Failed - 05/30/2023  6:53 PM      Failed - HBA1C is between 0 and 7.9 and within 180 days    Hemoglobin A1C  Date Value  Ref Range Status  03/15/2014 7.1 (H) 4.2 - 6.3 % Final    Comment:    The American Diabetes Association recommends that a primary goal of therapy should be <7% and that physicians should reevaluate the treatment regimen in patients with HbA1c values consistently >8%.    HbA1c, POC (controlled diabetic range)  Date Value Ref Range Status  10/13/2022 7.2 (A) 0.0 - 7.0 % Final         Failed - Valid encounter within last 6 months    Recent Outpatient Visits           7 months ago Type 2 diabetes mellitus with hyperglycemia, with long-term current use of insulin (HCC)   [place] [facility] Wellness Center [place], [place], MD   1 year ago Type 2 diabetes mellitus with hyperglycemia, with long-term current use of insulin [facility])   [place] [facility] [place], [place], MD   1 year ago Type 2 diabetes mellitus with hyperglycemia, with long-term current use of insulin [facility])    [facility] [place], Odette Horns, MD   2 years ago Type 2 diabetes mellitus with hyperglycemia, with long-term current use of insulin (HCC)  [facility] [place], Odette Horns, MD   2 years ago Acute non-recurrent sinusitis of other sinus   [place] [facility] Hoy Register, MD       Future Appointments             In 1 month Revankar, Aundra Dubin, MD [place] [facility]   In 1 month Hoy Register, MD [facility]

## 2023-06-09 ENCOUNTER — Other Ambulatory Visit: Payer: Self-pay | Admitting: Family Medicine

## 2023-06-09 ENCOUNTER — Other Ambulatory Visit: Payer: Self-pay

## 2023-06-09 ENCOUNTER — Other Ambulatory Visit (HOSPITAL_COMMUNITY): Payer: Self-pay

## 2023-06-09 DIAGNOSIS — E1142 Type 2 diabetes mellitus with diabetic polyneuropathy: Secondary | ICD-10-CM

## 2023-06-09 DIAGNOSIS — M7502 Adhesive capsulitis of left shoulder: Secondary | ICD-10-CM

## 2023-06-09 MED ORDER — CYCLOBENZAPRINE HCL 10 MG PO TABS
10.0000 mg | ORAL_TABLET | Freq: Two times a day (BID) | ORAL | 0 refills | Status: DC | PRN
  Filled 2023-06-09: qty 60, 30d supply, fill #0

## 2023-06-09 NOTE — Telephone Encounter (Signed)
Requested medication (s) are due for refill today:   Yes for gabapentin;   Provider to review Flexeril  Requested medication (s) are on the active medication list:   Yes for both  Future visit scheduled:   Ys 12/18 with Dr. Alvis Lemmings   Last ordered: gabapentin 02/09/2023 #120, 3 refills;   Flexeril 05/09/2023 #60, 0 refills  Non delegated refill reason returned.   Requested Prescriptions  Pending Prescriptions Disp Refills   gabapentin (NEURONTIN) 300 MG capsule 120 capsule 3    Sig: Take 2 capsules (600 mg total) by mouth 2 (two) times daily.     Neurology: Anticonvulsants - gabapentin Failed - 06/09/2023 10:58 AM      Failed - Cr in normal range and within 360 days    Creatinine  Date Value Ref Range Status  03/16/2014 1.00 0.60 - 1.30 mg/dL Final   Creatinine, Ser  Date Value Ref Range Status  08/20/2022 1.81 (H) 0.61 - 1.24 mg/dL Final         Passed - Completed PHQ-2 or PHQ-9 in the last 360 days      Passed - Valid encounter within last 12 months    Recent Outpatient Visits           7 months ago Type 2 diabetes mellitus with hyperglycemia, with long-term current use of insulin (HCC)   [place] [facility] [place], [place], MD   1 year ago Type 2 diabetes mellitus with hyperglycemia, with long-term current use of insulin (HCC)   [place] [facility] [place], [place], MD   1 year ago Type 2 diabetes mellitus with hyperglycemia, with long-term current use of insulin (HCC)   [place] [facility] [place], Odette Horns, MD   2 years ago Type 2 diabetes mellitus with hyperglycemia, with long-term current use of insulin (HCC)   [place] [facility] [place], Odette Horns, MD   2 years ago Acute non-recurrent sinusitis of other sinus   [place] [facility] [place], Odette Horns, MD       Future Appointments             In 1 month Revankar, Aundra Dubin, MD [place]  [facility]   In 1 month Hoy Register, MD [facility]             cyclobenzaprine (FLEXERIL) 10 MG tablet 60 tablet 0    Sig: Take 1 tablet (10 mg total) by mouth 2 (two) times daily as needed for muscle spasms.     Not Delegated - Analgesics:  Muscle Relaxants Failed - 06/09/2023 10:58 AM      Failed - This refill cannot be delegated      Failed - Valid encounter within last 6 months    Recent Outpatient Visits           7 months ago Type 2 diabetes mellitus with hyperglycemia, with long-term current use of insulin (HCC)   [place] [facility] [place], [place], MD   1 year ago Type 2 diabetes mellitus with hyperglycemia, with long-term current use of insulin [facility])   [place] [facility] [place], Odette Horns, MD   1 year ago Type 2 diabetes mellitus with hyperglycemia, with long-term current use of insulin [facility])   [place] [facility] [place], Odette Horns, MD   2 years ago Type 2 diabetes  mellitus with hyperglycemia, with long-term current use of insulin (HCC)   [place] [facility] [place], Odette Horns, MD   2 years ago Acute non-recurrent sinusitis of other sinus   [place] [facility] Hoy Register, MD       Future Appointments             In 1 month Revankar, Aundra Dubin, MD [place] [facility]   In 1 month Hoy Register, MD [facility]

## 2023-06-10 ENCOUNTER — Other Ambulatory Visit: Payer: Self-pay

## 2023-06-10 ENCOUNTER — Other Ambulatory Visit (HOSPITAL_COMMUNITY): Payer: Self-pay

## 2023-06-10 MED ORDER — GABAPENTIN 300 MG PO CAPS
600.0000 mg | ORAL_CAPSULE | Freq: Two times a day (BID) | ORAL | 3 refills | Status: DC
  Filled 2023-06-10: qty 120, 30d supply, fill #0
  Filled 2023-07-11: qty 120, 30d supply, fill #1
  Filled 2023-08-10: qty 120, 30d supply, fill #2

## 2023-06-24 ENCOUNTER — Telehealth: Payer: Medicaid Other | Admitting: Physician Assistant

## 2023-06-24 ENCOUNTER — Other Ambulatory Visit (HOSPITAL_COMMUNITY): Payer: Self-pay

## 2023-06-24 DIAGNOSIS — J019 Acute sinusitis, unspecified: Secondary | ICD-10-CM

## 2023-06-24 DIAGNOSIS — B9689 Other specified bacterial agents as the cause of diseases classified elsewhere: Secondary | ICD-10-CM | POA: Diagnosis not present

## 2023-06-24 MED ORDER — AMOXICILLIN-POT CLAVULANATE 875-125 MG PO TABS
1.0000 | ORAL_TABLET | Freq: Two times a day (BID) | ORAL | 0 refills | Status: DC
  Filled 2023-06-24 (×3): qty 20, 10d supply, fill #0

## 2023-06-24 MED ORDER — BENZONATATE 100 MG PO CAPS
100.0000 mg | ORAL_CAPSULE | Freq: Three times a day (TID) | ORAL | 0 refills | Status: AC | PRN
  Filled 2023-06-24 (×3): qty 30, 5d supply, fill #0

## 2023-06-24 NOTE — Progress Notes (Signed)

## 2023-07-11 ENCOUNTER — Other Ambulatory Visit: Payer: Self-pay

## 2023-07-11 ENCOUNTER — Other Ambulatory Visit: Payer: Self-pay | Admitting: Family Medicine

## 2023-07-11 DIAGNOSIS — I5042 Chronic combined systolic (congestive) and diastolic (congestive) heart failure: Secondary | ICD-10-CM

## 2023-07-11 DIAGNOSIS — E1165 Type 2 diabetes mellitus with hyperglycemia: Secondary | ICD-10-CM

## 2023-07-11 DIAGNOSIS — E1159 Type 2 diabetes mellitus with other circulatory complications: Secondary | ICD-10-CM

## 2023-07-11 MED ORDER — FUROSEMIDE 40 MG PO TABS
40.0000 mg | ORAL_TABLET | Freq: Every day | ORAL | 0 refills | Status: DC
  Filled 2023-07-11: qty 30, 30d supply, fill #0

## 2023-07-11 MED ORDER — ROSUVASTATIN CALCIUM 10 MG PO TABS
10.0000 mg | ORAL_TABLET | Freq: Every day | ORAL | 0 refills | Status: DC
  Filled 2023-07-11: qty 30, 30d supply, fill #0

## 2023-07-11 MED ORDER — LISINOPRIL 20 MG PO TABS
20.0000 mg | ORAL_TABLET | Freq: Every day | ORAL | 0 refills | Status: DC
  Filled 2023-07-11: qty 30, 30d supply, fill #0

## 2023-07-11 MED ORDER — CARVEDILOL 6.25 MG PO TABS
6.2500 mg | ORAL_TABLET | Freq: Two times a day (BID) | ORAL | 0 refills | Status: DC
  Filled 2023-07-11: qty 60, 30d supply, fill #0

## 2023-07-11 MED ORDER — AMLODIPINE BESYLATE 10 MG PO TABS
10.0000 mg | ORAL_TABLET | Freq: Every day | ORAL | 0 refills | Status: DC
  Filled 2023-07-11: qty 30, 30d supply, fill #0

## 2023-07-11 MED ORDER — LANTUS SOLOSTAR 100 UNIT/ML ~~LOC~~ SOPN
20.0000 [IU] | PEN_INJECTOR | Freq: Every day | SUBCUTANEOUS | 0 refills | Status: DC
  Filled 2023-07-11 – 2023-08-10 (×2): qty 6, 30d supply, fill #0

## 2023-07-12 ENCOUNTER — Other Ambulatory Visit: Payer: Self-pay

## 2023-07-12 ENCOUNTER — Other Ambulatory Visit (HOSPITAL_COMMUNITY): Payer: Self-pay

## 2023-07-21 ENCOUNTER — Other Ambulatory Visit: Payer: Self-pay

## 2023-07-27 ENCOUNTER — Ambulatory Visit: Payer: Medicaid Other | Admitting: Family Medicine

## 2023-07-27 ENCOUNTER — Ambulatory Visit: Payer: Medicaid Other | Admitting: Cardiology

## 2023-08-10 ENCOUNTER — Other Ambulatory Visit: Payer: Self-pay | Admitting: Family Medicine

## 2023-08-10 ENCOUNTER — Other Ambulatory Visit (HOSPITAL_COMMUNITY): Payer: Self-pay

## 2023-08-10 DIAGNOSIS — M7502 Adhesive capsulitis of left shoulder: Secondary | ICD-10-CM

## 2023-08-10 DIAGNOSIS — I152 Hypertension secondary to endocrine disorders: Secondary | ICD-10-CM

## 2023-08-10 DIAGNOSIS — I110 Hypertensive heart disease with heart failure: Secondary | ICD-10-CM

## 2023-08-10 DIAGNOSIS — E1165 Type 2 diabetes mellitus with hyperglycemia: Secondary | ICD-10-CM

## 2023-08-11 ENCOUNTER — Other Ambulatory Visit (HOSPITAL_COMMUNITY): Payer: Self-pay

## 2023-08-11 ENCOUNTER — Other Ambulatory Visit (HOSPITAL_BASED_OUTPATIENT_CLINIC_OR_DEPARTMENT_OTHER): Payer: Self-pay

## 2023-08-11 ENCOUNTER — Other Ambulatory Visit: Payer: Self-pay

## 2023-08-12 ENCOUNTER — Other Ambulatory Visit: Payer: Self-pay | Admitting: Family Medicine

## 2023-08-12 ENCOUNTER — Other Ambulatory Visit (HOSPITAL_COMMUNITY): Payer: Self-pay

## 2023-08-12 ENCOUNTER — Other Ambulatory Visit: Payer: Self-pay

## 2023-08-12 DIAGNOSIS — I110 Hypertensive heart disease with heart failure: Secondary | ICD-10-CM

## 2023-08-12 DIAGNOSIS — E1159 Type 2 diabetes mellitus with other circulatory complications: Secondary | ICD-10-CM

## 2023-08-12 DIAGNOSIS — E1165 Type 2 diabetes mellitus with hyperglycemia: Secondary | ICD-10-CM

## 2023-08-12 DIAGNOSIS — M7502 Adhesive capsulitis of left shoulder: Secondary | ICD-10-CM

## 2023-08-12 MED ORDER — ROSUVASTATIN CALCIUM 10 MG PO TABS
10.0000 mg | ORAL_TABLET | Freq: Every day | ORAL | 0 refills | Status: DC
  Filled 2023-08-12: qty 7, 7d supply, fill #0

## 2023-08-12 MED ORDER — LISINOPRIL 20 MG PO TABS
20.0000 mg | ORAL_TABLET | Freq: Every day | ORAL | 0 refills | Status: DC
  Filled 2023-08-12: qty 7, 7d supply, fill #0

## 2023-08-12 MED ORDER — CARVEDILOL 6.25 MG PO TABS
6.2500 mg | ORAL_TABLET | Freq: Two times a day (BID) | ORAL | 0 refills | Status: DC
  Filled 2023-08-12: qty 14, 7d supply, fill #0

## 2023-08-12 MED ORDER — AMLODIPINE BESYLATE 10 MG PO TABS
10.0000 mg | ORAL_TABLET | Freq: Every day | ORAL | 0 refills | Status: DC
  Filled 2023-08-12: qty 7, 7d supply, fill #0

## 2023-08-12 MED ORDER — CYCLOBENZAPRINE HCL 10 MG PO TABS
10.0000 mg | ORAL_TABLET | Freq: Two times a day (BID) | ORAL | 0 refills | Status: DC | PRN
  Filled 2023-08-12: qty 14, 7d supply, fill #0

## 2023-08-12 MED ORDER — FUROSEMIDE 40 MG PO TABS
40.0000 mg | ORAL_TABLET | Freq: Every day | ORAL | 0 refills | Status: DC
  Filled 2023-08-12: qty 7, 7d supply, fill #0

## 2023-08-15 ENCOUNTER — Other Ambulatory Visit (HOSPITAL_COMMUNITY): Payer: Self-pay

## 2023-08-15 ENCOUNTER — Other Ambulatory Visit: Payer: Self-pay

## 2023-08-15 MED ORDER — METFORMIN HCL 500 MG PO TABS
1000.0000 mg | ORAL_TABLET | Freq: Two times a day (BID) | ORAL | 0 refills | Status: DC
  Filled 2023-08-15: qty 28, 7d supply, fill #0

## 2023-08-18 ENCOUNTER — Other Ambulatory Visit (HOSPITAL_COMMUNITY): Payer: Self-pay

## 2023-08-18 ENCOUNTER — Other Ambulatory Visit: Payer: Self-pay

## 2023-08-18 ENCOUNTER — Ambulatory Visit: Payer: Medicaid Other | Attending: Physician Assistant | Admitting: Physician Assistant

## 2023-08-18 VITALS — BP 158/83 | HR 80

## 2023-08-18 DIAGNOSIS — I152 Hypertension secondary to endocrine disorders: Secondary | ICD-10-CM

## 2023-08-18 DIAGNOSIS — I5042 Chronic combined systolic (congestive) and diastolic (congestive) heart failure: Secondary | ICD-10-CM | POA: Diagnosis not present

## 2023-08-18 DIAGNOSIS — E1165 Type 2 diabetes mellitus with hyperglycemia: Secondary | ICD-10-CM | POA: Diagnosis not present

## 2023-08-18 DIAGNOSIS — M7502 Adhesive capsulitis of left shoulder: Secondary | ICD-10-CM | POA: Diagnosis not present

## 2023-08-18 DIAGNOSIS — E1142 Type 2 diabetes mellitus with diabetic polyneuropathy: Secondary | ICD-10-CM

## 2023-08-18 DIAGNOSIS — I110 Hypertensive heart disease with heart failure: Secondary | ICD-10-CM

## 2023-08-18 DIAGNOSIS — Z7984 Long term (current) use of oral hypoglycemic drugs: Secondary | ICD-10-CM | POA: Diagnosis not present

## 2023-08-18 DIAGNOSIS — Z794 Long term (current) use of insulin: Secondary | ICD-10-CM | POA: Diagnosis not present

## 2023-08-18 DIAGNOSIS — J019 Acute sinusitis, unspecified: Secondary | ICD-10-CM

## 2023-08-18 DIAGNOSIS — B9689 Other specified bacterial agents as the cause of diseases classified elsewhere: Secondary | ICD-10-CM

## 2023-08-18 DIAGNOSIS — E1159 Type 2 diabetes mellitus with other circulatory complications: Secondary | ICD-10-CM

## 2023-08-18 DIAGNOSIS — Z1331 Encounter for screening for depression: Secondary | ICD-10-CM | POA: Diagnosis not present

## 2023-08-18 LAB — POCT GLYCOSYLATED HEMOGLOBIN (HGB A1C): HbA1c, POC (controlled diabetic range): 7.4 % — AB (ref 0.0–7.0)

## 2023-08-18 LAB — GLUCOSE, POCT (MANUAL RESULT ENTRY): POC Glucose: 211 mg/dL — AB (ref 70–99)

## 2023-08-18 MED ORDER — GABAPENTIN 300 MG PO CAPS
600.0000 mg | ORAL_CAPSULE | Freq: Two times a day (BID) | ORAL | 3 refills | Status: DC
  Filled 2023-08-18 – 2023-09-08 (×3): qty 120, 30d supply, fill #0
  Filled 2023-10-12: qty 120, 30d supply, fill #1
  Filled 2023-11-07: qty 120, 30d supply, fill #2
  Filled 2023-12-08: qty 120, 30d supply, fill #3

## 2023-08-18 MED ORDER — FUROSEMIDE 40 MG PO TABS
40.0000 mg | ORAL_TABLET | Freq: Every day | ORAL | 3 refills | Status: DC
  Filled 2023-08-18 – 2023-08-22 (×2): qty 30, 30d supply, fill #0
  Filled 2023-09-17: qty 30, 30d supply, fill #1
  Filled 2023-11-07: qty 30, 30d supply, fill #2
  Filled 2023-12-04: qty 30, 30d supply, fill #3

## 2023-08-18 MED ORDER — CYCLOBENZAPRINE HCL 10 MG PO TABS
10.0000 mg | ORAL_TABLET | Freq: Every day | ORAL | 0 refills | Status: DC
  Filled 2023-08-18: qty 90, 90d supply, fill #0
  Filled 2023-08-22: qty 30, 30d supply, fill #0
  Filled 2023-09-16: qty 30, 30d supply, fill #1
  Filled 2023-10-16: qty 30, 30d supply, fill #2

## 2023-08-18 MED ORDER — METFORMIN HCL 500 MG PO TABS
1000.0000 mg | ORAL_TABLET | Freq: Two times a day (BID) | ORAL | 3 refills | Status: DC
  Filled 2023-08-18 – 2023-08-22 (×2): qty 120, 30d supply, fill #0
  Filled 2023-09-17: qty 120, 30d supply, fill #1
  Filled 2023-11-07: qty 120, 30d supply, fill #2
  Filled 2023-12-04: qty 120, 30d supply, fill #3

## 2023-08-18 MED ORDER — TAMSULOSIN HCL 0.4 MG PO CAPS
0.4000 mg | ORAL_CAPSULE | Freq: Every day | ORAL | 1 refills | Status: DC
  Filled 2023-08-18 – 2023-08-22 (×2): qty 90, 90d supply, fill #0
  Filled 2023-11-15: qty 90, 90d supply, fill #1

## 2023-08-18 MED ORDER — CARVEDILOL 6.25 MG PO TABS
6.2500 mg | ORAL_TABLET | Freq: Two times a day (BID) | ORAL | 1 refills | Status: DC
  Filled 2023-08-18 – 2023-08-22 (×2): qty 180, 90d supply, fill #0
  Filled 2023-11-16: qty 180, 90d supply, fill #1

## 2023-08-18 MED ORDER — AMOXICILLIN-POT CLAVULANATE 875-125 MG PO TABS
1.0000 | ORAL_TABLET | Freq: Two times a day (BID) | ORAL | 0 refills | Status: DC
  Filled 2023-08-18: qty 20, 10d supply, fill #0

## 2023-08-18 MED ORDER — CETIRIZINE HCL 10 MG PO TABS
10.0000 mg | ORAL_TABLET | Freq: Every day | ORAL | 11 refills | Status: AC
  Filled 2023-08-18: qty 30, 30d supply, fill #0
  Filled 2023-09-16: qty 30, 30d supply, fill #1
  Filled 2023-10-12: qty 30, 30d supply, fill #2
  Filled 2023-11-15: qty 30, 30d supply, fill #3
  Filled 2023-12-08: qty 30, 30d supply, fill #4
  Filled 2024-01-09: qty 30, 30d supply, fill #5
  Filled 2024-01-29 – 2024-02-03 (×2): qty 30, 30d supply, fill #6
  Filled 2024-03-06: qty 30, 30d supply, fill #7
  Filled 2024-04-04: qty 30, 30d supply, fill #8
  Filled 2024-04-30: qty 30, 30d supply, fill #9
  Filled 2024-05-30 – 2024-06-06 (×2): qty 30, 30d supply, fill #10
  Filled 2024-07-01: qty 30, 30d supply, fill #11

## 2023-08-18 MED ORDER — AMLODIPINE BESYLATE 10 MG PO TABS
10.0000 mg | ORAL_TABLET | Freq: Every day | ORAL | 1 refills | Status: DC
  Filled 2023-08-18 – 2023-08-22 (×2): qty 90, 90d supply, fill #0
  Filled 2023-11-16: qty 90, 90d supply, fill #1

## 2023-08-18 MED ORDER — ROSUVASTATIN CALCIUM 10 MG PO TABS
10.0000 mg | ORAL_TABLET | Freq: Every day | ORAL | 0 refills | Status: DC
  Filled 2023-08-18 – 2023-08-22 (×2): qty 90, 90d supply, fill #0

## 2023-08-18 MED ORDER — LISINOPRIL 20 MG PO TABS
20.0000 mg | ORAL_TABLET | Freq: Every day | ORAL | 0 refills | Status: DC
  Filled 2023-08-18 – 2023-08-22 (×2): qty 90, 90d supply, fill #0

## 2023-08-18 MED ORDER — OZEMPIC (0.25 OR 0.5 MG/DOSE) 2 MG/3ML ~~LOC~~ SOPN
0.5000 mg | PEN_INJECTOR | SUBCUTANEOUS | 1 refills | Status: DC
  Filled 2023-08-18: qty 3, 28d supply, fill #0
  Filled 2023-09-06 – 2023-09-08 (×2): qty 3, 28d supply, fill #1

## 2023-08-18 NOTE — Progress Notes (Signed)
 Patient ID: Kenneth Rios, male   DOB: 1965-12-11, 58 y.o.   MRN: [id number]   Kenneth Rios, is a 58 y.o. male  [id number]  FMW:[id number]  DOB - 24-Jun-1966  Chief Complaint  Patient presents with   Medical Management of Chronic Issues    Patient wants to try the ozempic    Medication Refill       Subjective:   Kenneth Rios is a 58 y.o. male here today for med RF and multiple concerns.  He needs and eye doctor referral for detecting diabetes changes.  He continues to have trouble with his L shoulder and needs RF of muscle relaxer which also helps with jumpy legs at night.  He has a spot on his L arm that is itching and he thinks something may have bitten him.  He has a tooth that continues getting infected.  He stopped taking lantus  bc he said he didn't like the way he feels.  He wants to try ozempic .  He sees cardiology next month  He scored 27 on PHQ9 with #9=3.   We discussed this at length.  He states he did not fill it out based on where he is in his life right now but was reflecting on the lowest point in his life many years ago.  As we reviewed the screening, he also said that he misread some of the questions bc of 7th grade reading level.  He is adamant about refusing counseling.  He says that currently he does feel down sometimes bc he and his wife are living in a hotel.  Denies drug/alcohol use.  His mom is helping them financially.  He feels shame bc he is unable to work.  He is unable to be as active as he used to be.  He is unable to perform sexually like he used to; so, many things such as these cause him to feel down.  He does have a strong faith in God and his mom is his rock and very supportive of him.  He says he would never harm himself.  Not interested in medication.     ALLERGIES: No Known Allergies  PAST MEDICAL HISTORY: Past Medical History:  Diagnosis Date   (HFpEF) heart failure with preserved ejection fraction (HCC) 03/30/2018   Accelerated  hypertension 06/05/2018   Acute viral bronchitis 08/24/2017   Adhesive capsulitis of left shoulder 08/27/2021   Angina pectoris (HCC) 03/29/2018   Anxiety    Anxiety and depression 02/06/2018   Asthma with status asthmaticus    CAD (coronary artery disease) 12/03/2021   Chest tightness 03/29/2018   CHF (congestive heart failure) (HCC)    fluttering   Depression    Diabetes mellitus due to underlying condition with unspecified complications (HCC) 01/09/2020   Diabetes mellitus without complication (HCC)    Diverticulitis    Diverticulitis large intestine 05/31/2018   GERD (gastroesophageal reflux disease)    Hyperlipidemia    Hypertension    Hypertension associated with diabetes (HCC) 08/24/2017   Hypertensive heart disease with chronic combined systolic and diastolic congestive heart failure (HCC) 08/24/2017   Hypertensive urgency 08/24/2017   Microcytic anemia 12/19/2018   Mixed dyslipidemia 01/09/2020   Peritonitis (HCC) 09/02/2017   Pneumonia 12/16/2018   Polysubstance abuse (HCC) 12/19/2018   Prolonged Q-T interval on ECG 02/06/2018   S/P colostomy (HCC) 09/16/2017   Type 2 diabetes mellitus with hyperglycemia, with long-term current use of insulin  (HCC) 08/27/2021   Unstable angina (HCC) 03/29/2018    MEDICATIONS AT  HOME: Prior to Admission medications   Medication Sig Start Date End Date Taking? Authorizing Provider  Accu-Chek Softclix Lancets lancets Use to check blood sugar three times daily. E11.65 04/26/23  Yes Newlin, Enobong, MD  aspirin  EC 81 MG tablet Take 1 tablet (81 mg total) by mouth daily. Swallow whole. 10/27/22  Yes Revankar, Jennifer SAUNDERS, MD  B-D UF III MINI PEN NEEDLES 31G X 5 MM MISC Use daily with insulin . 04/28/23  Yes Newlin, Enobong, MD  benzonatate  (TESSALON ) 100 MG capsule Take 1-2 capsules (100-200 mg total) by mouth 3 (three) times daily as needed. 06/24/23  Yes Vivienne Delon HERO, PA-C  Blood Glucose Monitoring Suppl (ACCU-CHEK GUIDE) w/Device KIT  Use to check blood sugar three times daily. E11.65 04/26/22  Yes Newlin, Enobong, MD  cetirizine  (ZYRTEC ) 10 MG tablet Take 1 tablet (10 mg total) by mouth daily. 08/18/23  Yes Danton Slough M, PA-C  glucose blood (ACCU-CHEK GUIDE) test strip Use to check blood sugar three times daily. 04/26/23  Yes Newlin, Corrina, MD  nitroGLYCERIN  (NITROSTAT ) 0.4 MG SL tablet Place 0.4 mg under the tongue every 5 (five) minutes as needed for chest pain.   Yes [provider]  Semaglutide ,0.25 or 0.5MG /DOS, (OZEMPIC , 0.25 OR 0.5 MG/DOSE,) 2 MG/3ML SOPN Inject 0.5 mg into the skin once a week. 08/18/23  Yes Danton Slough HERO, PA-C  amLODipine  (NORVASC ) 10 MG tablet Take 1 tablet (10 mg total) by mouth daily. 08/18/23   Danton Slough HERO, PA-C  amoxicillin -clavulanate (AUGMENTIN ) 875-125 MG tablet Take 1 tablet by mouth 2 (two) times daily. 08/18/23   Devone Bonilla M, PA-C  carvedilol  (COREG ) 6.25 MG tablet Take 1 tablet (6.25 mg total) by mouth 2 (two) times daily with a meal. 08/18/23   Bren Borys, Slough HERO, PA-C  cyclobenzaprine  (FLEXERIL ) 10 MG tablet Take 1 tablet (10 mg total) by mouth at bedtime. 08/18/23   Danton Slough HERO, PA-C  furosemide  (LASIX ) 40 MG tablet Take 1 tablet (40 mg total) by mouth daily. 08/18/23   Danton Slough HERO, PA-C  gabapentin  (NEURONTIN ) 300 MG capsule Take 2 capsules (600 mg total) by mouth 2 (two) times daily. 08/18/23   Danton Slough HERO, PA-C  lisinopril  (ZESTRIL ) 20 MG tablet Take 1 tablet (20 mg total) by mouth daily. 08/18/23   Graciano Batson M, PA-C  metFORMIN  (GLUCOPHAGE ) 500 MG tablet Take 2 tablets (1,000 mg total) by mouth 2 (two) times daily with a meal. 08/18/23   Chelcie Estorga, Slough HERO, PA-C  rosuvastatin  (CRESTOR ) 10 MG tablet Take 1 tablet (10 mg total) by mouth daily. 08/18/23   Danton Slough HERO, PA-C  tamsulosin  (FLOMAX ) 0.4 MG CAPS capsule Take 1 capsule (0.4 mg total) by mouth daily. 08/18/23   Jachelle Fluty, Slough HERO, PA-C    ROS: Neg HEENT Neg resp Neg cardiac Neg GI Neg GU Neg  MS Neg psych Neg neuro  Objective:  BP 153/83 Pulse ox:96% Pulse 80 Resp 14  Exam General appearance : Awake, alert, not in any distress. Speech Clear. Not toxic looking HEENT: Atraumatic and Normocephalic, poor dentition.  Abscess upper L Neck: Supple, no JVD. No cervical lymphadenopathy.  Chest: Good air entry bilaterally, CTAB.  No rales/rhonchi/wheezing CVS: S1 S2 regular, no murmurs.  Extremities: B/L Lower Ext shows no edema, both legs are warm to touch L arm just above elbow there is a small area that appears to be a bite or scratch that is slightly swollen and tender.  Not affecting joint movement.   Neurology: Awake alert, and  oriented X 3, CN II-XII intact, Non focal Skin: No Rash  Data Review Lab Results  Component Value Date   HGBA1C 7.4 (A) 08/18/2023   HGBA1C 7.2 (A) 10/13/2022   HGBA1C 7.6 (A) 04/26/2022    Assessment & Plan   1. Type 2 diabetes mellitus with hyperglycemia, with long-term current use of insulin  (HCC) (Primary) He has stopped lantus  on his own stating it made him feel bad.  Not at goal - Glucose (CBG) - HgB A1c - metFORMIN  (GLUCOPHAGE ) 500 MG tablet; Take 2 tablets (1,000 mg total) by mouth 2 (two) times daily with a meal.  Dispense: 120 tablet; Refill: 3 - Semaglutide ,0.25 or 0.5MG /DOS, (OZEMPIC , 0.25 OR 0.5 MG/DOSE,) 2 MG/3ML SOPN; Inject 0.5 mg into the skin once a week.  Dispense: 3 mL; Refill: 1 - Ambulatory referral to Ophthalmology  2. Long term (current) use of insulin  (HCC)  3. Long term current use of oral hypoglycemic drug  4. Diabetic polyneuropathy associated with type 2 diabetes mellitus (HCC) - gabapentin  (NEURONTIN ) 300 MG capsule; Take 2 capsules (600 mg total) by mouth 2 (two) times daily.  Dispense: 120 capsule; Refill: 3  5. Hypertension associated with diabetes (HCC) - amLODipine  (NORVASC ) 10 MG tablet; Take 1 tablet (10 mg total) by mouth daily.  Dispense: 90 tablet; Refill: 1 - carvedilol  (COREG ) 6.25 MG tablet;  Take 1 tablet (6.25 mg total) by mouth 2 (two) times daily with a meal.  Dispense: 180 tablet; Refill: 1 - lisinopril  (ZESTRIL ) 20 MG tablet; Take 1 tablet (20 mg total) by mouth daily.  Dispense: 90 tablet; Refill: 0  6. Hypertensive heart disease with chronic combined systolic and diastolic congestive heart failure (HCC) - furosemide  (LASIX ) 40 MG tablet; Take 1 tablet (40 mg total) by mouth daily.  Dispense: 30 tablet; Refill: 3 - rosuvastatin  (CRESTOR ) 10 MG tablet; Take 1 tablet (10 mg total) by mouth daily.  Dispense: 90 tablet; Refill: 0  7. Adhesive capsulitis of left shoulder - cyclobenzaprine  (FLEXERIL ) 10 MG tablet; Take 1 tablet (10 mg total) by mouth at bedtime.  Dispense: 90 tablet; Refill: 0 - Ambulatory referral to Orthopedic Surgery  8. Acute bacterial sinusitis - amoxicillin -clavulanate (AUGMENTIN ) 875-125 MG tablet; Take 1 tablet by mouth 2 (two) times daily.  Dispense: 20 tablet; Refill: 0  9. Positive depression screening We discussed this at length.  He states he did not fill it out based on where he is in his life right now but was reflecting on the lowest point in his life many years ago.  As we reviewed the screening, he also said that he misread some of the questions bc of 7th grade reading level.  He is adamant about refusing counseling.      Return in about 6 weeks (around 09/29/2023) for PCP for chronic conditions-Dr Newlin.  The patient was given clear instructions to go to ER or return to medical center if symptoms don't improve, worsen or new problems develop. The patient verbalized understanding. The patient was told to call to get lab results if they haven't heard anything in the next week.      Jon Moores, PA-C [facility] [place], [place] [phone]   08/18/2023, 5:13 PM

## 2023-08-19 ENCOUNTER — Other Ambulatory Visit (HOSPITAL_COMMUNITY): Payer: Self-pay

## 2023-08-22 ENCOUNTER — Other Ambulatory Visit: Payer: Self-pay

## 2023-08-30 ENCOUNTER — Ambulatory Visit: Payer: Medicaid Other | Admitting: Orthopaedic Surgery

## 2023-09-06 ENCOUNTER — Other Ambulatory Visit (HOSPITAL_COMMUNITY): Payer: Self-pay

## 2023-09-08 ENCOUNTER — Other Ambulatory Visit (HOSPITAL_COMMUNITY): Payer: Self-pay

## 2023-09-08 ENCOUNTER — Other Ambulatory Visit: Payer: Self-pay

## 2023-09-16 ENCOUNTER — Other Ambulatory Visit: Payer: Self-pay | Admitting: Physician Assistant

## 2023-09-16 ENCOUNTER — Other Ambulatory Visit (HOSPITAL_COMMUNITY): Payer: Self-pay

## 2023-09-16 DIAGNOSIS — B9689 Other specified bacterial agents as the cause of diseases classified elsewhere: Secondary | ICD-10-CM

## 2023-09-16 NOTE — Telephone Encounter (Signed)
 Requested medication (s) are due for refill today: na   Requested medication (s) are on the active medication list: yes   Last refill:  08/18/23 #20 0 refills  Future visit scheduled: yes in 2 weeks   Notes to clinic:  medication not assigned to a protocol. Do you want to refill Rx?     Requested Prescriptions  Pending Prescriptions Disp Refills   amoxicillin -clavulanate (AUGMENTIN ) 875-125 MG tablet 20 tablet 0    Sig: Take 1 tablet by mouth 2 (two) times daily.     Off-Protocol Failed - 09/16/2023  3:26 PM      Failed - Medication not assigned to a protocol, review manually.      Passed - Valid encounter within last 12 months    Recent Outpatient Visits           4 weeks ago Type 2 diabetes mellitus with hyperglycemia, with long-term current use of insulin  [place] [facility])   [facility] [place]. [facility], Jon M, [place]   11 months ago Type 2 diabetes mellitus with hyperglycemia, with long-term current use of insulin  [facility])   [place] [facility] [place]. [facility] Delbert Clam, [place]   1 year ago Type 2 diabetes mellitus with hyperglycemia, with long-term current use of insulin  [facility])   [place] [facility] [place]. [facility] Delbert Clam, [place]   2 years ago Type 2 diabetes mellitus with hyperglycemia, with long-term current use of insulin  [facility])   [place] [facility] [place]. [facility] Delbert Clam, [place]   2 years ago Type 2 diabetes mellitus with hyperglycemia, with long-term current use of insulin  [facility])   [place] [facility] [place]. [facility] Delbert Clam, [place]       Future Appointments             In 1 week Revankar, Jennifer SAUNDERS, MD [facility]   In 2 weeks Delbert Clam, MD [facility] [place] [facility] [place]. [facility]

## 2023-09-17 ENCOUNTER — Other Ambulatory Visit: Payer: Self-pay | Admitting: Physician Assistant

## 2023-09-17 ENCOUNTER — Other Ambulatory Visit (HOSPITAL_COMMUNITY): Payer: Self-pay

## 2023-09-17 DIAGNOSIS — B9689 Other specified bacterial agents as the cause of diseases classified elsewhere: Secondary | ICD-10-CM

## 2023-09-19 NOTE — Telephone Encounter (Signed)
 Spoke to patient who stated the medication was requested in error. Patient states please cancel the request for a refill it is not needed.

## 2023-09-23 ENCOUNTER — Ambulatory Visit: Payer: Medicaid Other | Admitting: Cardiology

## 2023-09-29 ENCOUNTER — Other Ambulatory Visit (HOSPITAL_COMMUNITY): Payer: Self-pay

## 2023-09-30 ENCOUNTER — Other Ambulatory Visit (HOSPITAL_COMMUNITY): Payer: Self-pay

## 2023-10-04 ENCOUNTER — Ambulatory Visit: Payer: Medicaid Other | Attending: Family Medicine | Admitting: Family Medicine

## 2023-10-04 ENCOUNTER — Other Ambulatory Visit: Payer: Self-pay

## 2023-10-04 VITALS — BP 112/65 | HR 84 | Ht 69.0 in | Wt 189.6 lb

## 2023-10-04 DIAGNOSIS — Z23 Encounter for immunization: Secondary | ICD-10-CM | POA: Diagnosis not present

## 2023-10-04 DIAGNOSIS — Z794 Long term (current) use of insulin: Secondary | ICD-10-CM

## 2023-10-04 DIAGNOSIS — E119 Type 2 diabetes mellitus without complications: Secondary | ICD-10-CM

## 2023-10-04 DIAGNOSIS — E1165 Type 2 diabetes mellitus with hyperglycemia: Secondary | ICD-10-CM

## 2023-10-04 DIAGNOSIS — Z7985 Long-term (current) use of injectable non-insulin antidiabetic drugs: Secondary | ICD-10-CM

## 2023-10-04 DIAGNOSIS — I110 Hypertensive heart disease with heart failure: Secondary | ICD-10-CM

## 2023-10-04 DIAGNOSIS — I10 Essential (primary) hypertension: Secondary | ICD-10-CM

## 2023-10-04 DIAGNOSIS — I152 Hypertension secondary to endocrine disorders: Secondary | ICD-10-CM

## 2023-10-04 MED ORDER — OZEMPIC (0.25 OR 0.5 MG/DOSE) 2 MG/3ML ~~LOC~~ SOPN
0.5000 mg | PEN_INJECTOR | SUBCUTANEOUS | 1 refills | Status: DC
  Filled 2023-10-04: qty 9, 84d supply, fill #0
  Filled 2023-12-08: qty 9, 84d supply, fill #1

## 2023-10-04 NOTE — Progress Notes (Signed)
 Subjective:  Patient ID: Kenneth Rios, male    DOB: 1965/09/29  Age: 58 y.o. MRN: [id number]  CC: Medical Management of Chronic Issues (90 day supply of ozempic.)   HPI Kenneth Rios is a 58 y.o. 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   Interval History: Discussed the use of AI scribe software for clinical note transcription with the patient, who gave verbal consent to proceed.  History of Present Illness Kenneth Rios, a patient with a history of diabetes and hypertension, presents for a follow-up visit. He reports improvement in his overall health status. His last A1c was 7.4, indicating relatively good control of his diabetes. He is currently on Ozempic, metformin.He had previously been on Lantus, but stopped due to side effects. He has not been checking his blood sugar at home, but plans to start. He also has a history of depression, but does not elaborate on his current mental health status. He has been experiencing significant social stressors, including housing instability and a sick father who is dying.  He plans to travel to [place] to be with his father.  This explains his elevated PHQ-9 scores.  He had declined referral for counseling at his last visit and again today. Endorses adherence with his antihypertensive and from a cardiac standpoint he has no dyspnea or chest pains.    Past Medical History:  Diagnosis Date   (HFpEF) heart failure with preserved ejection fraction (HCC) 03/30/2018   Accelerated hypertension 06/05/2018   Acute viral bronchitis 08/24/2017   Adhesive capsulitis of left shoulder 08/27/2021   Angina pectoris (HCC) 03/29/2018   Anxiety     Anxiety and depression 02/06/2018   Asthma with status asthmaticus    CAD (coronary artery disease) 12/03/2021   Chest tightness 03/29/2018   CHF (congestive heart failure) (HCC)    "fluttering"   Depression    Diabetes mellitus due to underlying condition with unspecified complications (HCC) 01/09/2020   Diabetes mellitus without complication (HCC)    Diverticulitis    Diverticulitis large intestine 05/31/2018   GERD (gastroesophageal reflux disease)    Hyperlipidemia    Hypertension    Hypertension associated with diabetes (HCC) 08/24/2017   Hypertensive heart disease with chronic combined systolic and diastolic congestive heart failure (HCC) 08/24/2017   Hypertensive urgency 08/24/2017   Microcytic anemia 12/19/2018   Mixed dyslipidemia 01/09/2020   Peritonitis (HCC) 09/02/2017   Pneumonia 12/16/2018   Polysubstance abuse (HCC) 12/19/2018   Prolonged Q-T interval on ECG 02/06/2018   S/P colostomy (HCC) 09/16/2017   Type 2 diabetes mellitus with hyperglycemia, with long-term current use of insulin (HCC) 08/27/2021   Unstable angina (HCC) 03/29/2018    Past Surgical History:  Procedure Laterality Date   CARDIAC CATHETERIZATION  03/30/2018   COLON RESECTION N/A 09/05/2017   Procedure: HARTMAN'S COLECTOMY AND COLOSTOMY;  Surgeon: Rodman Pickle, MD;  Location: [facility] OR;  Service: General;  Laterality: N/A;   COLON SURGERY     COLOSTOMY TAKEDOWN N/A 05/31/2018   Procedure: LAPAROSCOPIC COLOSTOMY REVERSAL COLORECTAL ANASTOMOSIS ERAS PATHWAY;  Surgeon: Sheliah Hatch De Blanch, MD;  Location: [facility] OR;  Service: General;  Laterality: N/A;   CORONARY PRESSURE/FFR STUDY N/A 03/30/2018   Procedure: INTRAVASCULAR PRESSURE WIRE/FFR STUDY;  Surgeon: Yvonne Kendall, MD;  Location: [facility] INV[facility];  Service: Cardiovascular;  Laterality: N/A;   LEFT HEART CATH AND CORONARY ANGIOGRAPHY N/A 03/30/2018   Procedure: LEFT HEART CATH AND CORONARY ANGIOGRAPHY;  Surgeon: Yvonne Kendall, MD;   [facility]: [facility];  Service: Cardiovascular;  Laterality: N/A;    Family History  Problem Relation Age of Onset   Heart disease Mother    Heart disease Sister    Diabetes Maternal Aunt    Heart disease Maternal Aunt    Diabetes Maternal Uncle    Sudden Cardiac Death Neg Hx     Social History   Socioeconomic History   Marital status: Single    Spouse name: Not on file   Number of children: 3   Years of education: Not on file   Highest education level: Not on file  Occupational History   Occupation: Disabled  Tobacco Use   Smoking status: Former   Smokeless tobacco: Never  Vaping Use   Vaping status: Never Used  Substance and Sexual Activity   Alcohol use: Yes    Alcohol/week: 6.0 standard drinks of alcohol    Types: 6 Cans of beer per week    Comment: on the weekends   Drug use: No   Sexual activity: Yes    Partners: Female  Other Topics Concern   Not on file  Social History Narrative   Not on file   Social Drivers of Health   Financial Resource Strain: Not on file  Food Insecurity: No Food Insecurity (08/23/2022)   Hunger Vital Sign    Worried About Running Out of Food in the Last Year: Never true    Ran Out of Food in the Last Year: Never true  Transportation Needs: Unknown (08/23/2022)   PRAPARE - Administrator, Civil Service (Medical): No    Lack of Transportation (Non-Medical): Not on file  Physical Activity: Not on file  Stress: Not on file  Social Connections: Not on file    No Known Allergies  Outpatient Medications Prior to Visit  Medication Sig Dispense Refill   Accu-Chek Softclix Lancets lancets Use to check blood sugar three times daily. E11.65 100 each 0   amLODipine (NORVASC) 10 MG tablet Take 1 tablet (10 mg total) by mouth daily. 90 tablet 1   amoxicillin-clavulanate (AUGMENTIN) 875-125 MG tablet Take 1 tablet by mouth 2 (two) times daily. 20 tablet 0   aspirin EC 81 MG tablet Take 1 tablet (81 mg total) by mouth  daily. Swallow whole. 90 tablet 3   B-D UF III MINI PEN NEEDLES 31G X 5 MM MISC Use daily with insulin. 100 each 3   benzonatate (TESSALON) 100 MG capsule Take 1-2 capsules (100-200 mg total) by mouth 3 (three) times daily as needed. 30 capsule 0   Blood Glucose Monitoring Suppl (ACCU-CHEK GUIDE) w/Device KIT Use to check blood sugar three times daily. E11.65 1 kit 0   carvedilol (COREG) 6.25 MG tablet Take 1 tablet (6.25 mg total) by mouth 2 (two) times daily with a meal. 180 tablet 1   cetirizine (ZYRTEC) 10 MG tablet Take 1 tablet (10 mg total) by mouth daily. 30 tablet 11   cyclobenzaprine (FLEXERIL) 10 MG tablet Take 1 tablet (10 mg total) by mouth at bedtime. 90 tablet 0   furosemide (LASIX) 40 MG tablet Take 1 tablet (40 mg total) by mouth daily. 30 tablet 3   gabapentin (NEURONTIN) 300 MG capsule Take 2 capsules (600 mg  total) by mouth 2 (two) times daily. 120 capsule 3   glucose blood (ACCU-CHEK GUIDE) test strip Use to check blood sugar three times daily. 100 each 0   metFORMIN (GLUCOPHAGE) 500 MG tablet Take 2 tablets (1,000 mg total) by mouth 2 (two) times daily with a meal. 120 tablet 3   nitroGLYCERIN (NITROSTAT) 0.4 MG SL tablet Place 0.4 mg under the tongue every 5 (five) minutes as needed for chest pain.     rosuvastatin (CRESTOR) 10 MG tablet Take 1 tablet (10 mg total) by mouth daily. 90 tablet 0   Semaglutide,0.25 or 0.5MG /DOS, (OZEMPIC, 0.25 OR 0.5 MG/DOSE,) 2 MG/3ML SOPN Inject 0.5 mg into the skin once a week. 3 mL 1   tamsulosin (FLOMAX) 0.4 MG CAPS capsule Take 1 capsule (0.4 mg total) by mouth daily. 90 capsule 1   lisinopril (ZESTRIL) 20 MG tablet Take 1 tablet (20 mg total) by mouth daily. 90 tablet 0   No facility-administered medications prior to visit.     ROS Review of Systems  Constitutional:  Negative for activity change and appetite change.  HENT:  Negative for sinus pressure and sore throat.   Respiratory:  Negative for chest tightness, shortness of breath  and wheezing.   Cardiovascular:  Negative for chest pain and palpitations.  Gastrointestinal:  Negative for abdominal distention, abdominal pain and constipation.  Genitourinary: Negative.   Musculoskeletal: Negative.   Psychiatric/Behavioral:  Negative for behavioral problems and dysphoric mood.     Objective:  BP 112/65   Pulse 84   Ht 5\' 9"  (1.753 m)   Wt 189 lb 9.6 oz (86 kg)   SpO2 99%   BMI 28.00 kg/m      10/04/2023    2:17 PM 08/18/2023    5:19 PM 10/27/2022    3:30 PM  BP/Weight  Systolic BP 112 158 114  Diastolic BP 65 83 66  Wt. (Lbs) 189.6  180.08  BMI 28 kg/m2  26.59 kg/m2      Physical Exam Constitutional:      Appearance: He is well-developed.  Cardiovascular:     Rate and Rhythm: Normal rate.     Heart sounds: Normal heart sounds. No murmur heard. Pulmonary:     E[place]: Pulmonary effort is normal.     Breath sounds: Normal breath sounds. No wheezing or rales.  Chest:     Chest wall: No tenderness.  Abdominal:     General: Bowel sounds are normal. There is no distension.     Palpations: Abdomen is soft. There is no mass.     Tenderness: There is no abdominal tenderness.  Musculoskeletal:        General: Normal range of motion.     Right lower leg: No edema.     Left lower leg: No edema.  Neurological:     Mental Status: He is alert and oriented to person, place, and time.  Psychiatric:        Mood and Affect: Mood normal.        Latest Ref Rng & Units 08/20/2022    3:10 PM 04/26/2022    3:58 PM 12/03/2021    8:58 AM  CMP  Glucose 70 - 99 mg/dL 027  253  664   BUN 6 - 20 mg/dL 18  17  9    Creatinine 0.61 - 1.24 mg/dL 4.03  4.74  2.59   Sodium 135 - 145 mmol/L 136  137  138   Potassium 3.5 - 5.1 mmol/L 3.8  4.9  4.8   Chloride 98 - 111 mmol/L 101  100  100   CO2 22 - 32 mmol/L 24  23  24    Calcium 8.9 - 10.3 mg/dL 9.3  9.6  9.9   Total Protein 6.5 - 8.1 g/dL 7.2   6.7   Total Bilirubin 0.3 - 1.2 mg/dL 1.0   0.6   Alkaline Phos 38 - 126  U/L 52   65   AST 15 - 41 U/L 20   12   ALT 0 - 44 U/L 11   11     Lipid Panel     Component Value Date/Time   CHOL 245 (H) 12/03/2021 0858   TRIG 138 12/03/2021 0858   HDL 49 12/03/2021 0858   CHOLHDL 5.0 12/03/2021 0858   CHOLHDL 6.1 03/31/2018 0227   VLDL UNABLE TO CALCULATE IF TRIGLYCERIDE OVER 400 mg/dL 09/81/1914 7829   LDLCALC 171 (H) 12/03/2021 0858    CBC    Component Value Date/Time   WBC 5.8 08/20/2022 1510   RBC 5.05 08/20/2022 1510   HGB 14.9 08/20/2022 1510   HGB 14.1 12/03/2021 0858   HCT 42.5 08/20/2022 1510   HCT 40.0 12/03/2021 0858   PLT 361 08/20/2022 1510   PLT 278 12/03/2021 0858   MCV 84.2 08/20/2022 1510   MCV 84 12/03/2021 0858   MCV 82 03/14/2014 0400   MCH 29.5 08/20/2022 1510   MCHC 35.1 08/20/2022 1510   RDW 12.1 08/20/2022 1510   RDW 13.1 12/03/2021 0858   RDW 13.5 03/14/2014 0400   LYMPHSABS 2.1 08/20/2022 1510   LYMPHSABS 2.4 12/03/2021 0858   LYMPHSABS 0.9 (L) 03/14/2014 0400   MONOABS 0.4 08/20/2022 1510   MONOABS 0.1 (L) 03/14/2014 0400   EOSABS 0.2 08/20/2022 1510   EOSABS 0.2 12/03/2021 0858   EOSABS 0.0 03/14/2014 0400   BASOSABS 0.0 08/20/2022 1510   BASOSABS 0.0 12/03/2021 0858   BASOSABS 0.0 03/14/2014 0400    Lab Results  Component Value Date   HGBA1C 7.4 (A) 08/18/2023    Assessment & Plan:   Assessment & Plan Type 2 Diabetes Mellitus Last A1c was 7.4. Patient is currently on Ozempic 0.5mg  and metformin. Patient reported tolerating Ozempic well. No recent home glucose monitoring due to personal circumstances. -Continue Ozempic 0.5mg  and metformin. -Encourage patient to intermittently check blood glucose at home. -Consider increasing Ozempic dose if blood glucose levels are high. -Order kidney and liver function tests.  Hypertension Blood pressure controlled at 112/65. Patient is currently on carvedilol and amlodipine. Lisinopril is on his med list but it appears he has not been taking it hence I will  discontinue it to prevent hypotension  -Continue carvedilol and amlodipine. -Discontinue lisinopril.  General Health Maintenance -Administer influenza and pneumonia vaccines today. -Schedule follow-up visit in three months. -Plan to check cholesterol at next visit when patient is fasting. -Ensure patient has access to medications if he travels to [place].      No orders of the defined types were placed in this encounter.   Follow-up: Return in about 3 months (around 01/01/2024) for Chronic medical conditions.       Hoy Register, MD, FAAFP. [facility] [place], [place] [phone]   10/04/2023, 2:47 PM

## 2023-10-04 NOTE — Patient Instructions (Signed)
 Blood Glucose Monitoring, Adult To manage your diabetes, you'll need to keep track of your blood sugar. This is called blood glucose monitoring. Check your blood glucose as often as told. Keep a journal of your results over time. This can help you: Know when to adjust your diabetes management plan with your health care provider. See how food, exercise, illness, and medicines affect your blood glucose. Know what your blood glucose is at any time. Your provider will set specific goals for your blood glucose levels. In many cases, these goals may be: Before meals: 80-130 mg/dL (3.6-6.4 mmol/L). After meals: below 180 mg/dL (10 mmol/L). A1C level: less than 7%. Supplies needed: Blood glucose meter. Test strips for your meter. Each brand of meter has its own strips. You must use the strips that came with your meter. A lancet. This is a sharp device used to poke your finger. Do not use a lancet more than once. A journal or logbook to write down your results. How to check your blood glucose Checking your blood glucose  Wash your hands with soap and water for at least 20 seconds. Use the lancet to poke the side of your finger. Do not poke the tip of your finger. Also, try not to use the same finger each time. Gently squeeze the finger until a small drop of blood appears. Follow the meter instructions on how to insert the test strip, apply blood to the strip, and use the meter. Write down your result and any notes. Using different sites Some blood glucose meters allow testing on other parts of your body to test your blood. The most common places are the forearm, thigh, upper arm, and palm of the hand. Check your meter's instructions. Using different sites may not be as accurate as your fingers. If you think you have low blood glucose, only use your finger. General tips Blood glucose log  Write down the result each time you check your blood glucose. Note anything that may be affecting your blood  glucose. This can help you and your provider: Look for patterns over time. Adjust your management plan as needed. Check if your meter has an app or lets you download your records to a computer. Most meters keep a record of glucose readings in the meter. If you have type 1 diabetes: Check your blood glucose as often as told. This may be: Before each meal and snack. Two hours after a meal. Before bedtime. If you have symptoms of hypoglycemia. After treating your hypoglycemia. Before doing things that have a risk of injury, such as driving or using machinery. Before and after exercise. Between 2:00 a.m. and 3:00 a.m. You may need to check your blood glucose more often, such as up to 6-10 times a day, if: You have diabetes that is not well controlled. You are ill. You have a history of severe hypoglycemia. You have hypoglycemia unawareness. If you have type 2 diabetes: You may need to check your blood glucose 2 or more times a day. Check your blood glucose as often as told by your provider. This may include: Before and after exercise. Before doing things that have a risk of injury, such as driving or using machinery. You may need to check your blood glucose more often if: Your medicine is being adjusted. Your diabetes is not well controlled. You are ill. General tips Always have your blood glucose meter and supplies with you. After you use a few boxes of test strips, adjust your blood glucose meter  as needed. Follow the meter instructions. If you have questions or need help, all blood glucose meters have a 24-hour hotline phone number that you can call. Also, contact your provider with any questions or concerns. Where to find more information The American Diabetes Association: diabetes.org The Association of Diabetes Care & Education Specialists: diabeteseducator.org Contact a health care provider if: Your blood glucose is at or above 240 mg/dL (24.4 mmol/L) for 2 days in a row. You  have been sick or have had a fever for 2 days or longer and are not getting better. You have any of these problems for more than 6 hours: You cannot eat or drink. You have nausea or vomiting. You have diarrhea. Get help right away if: Your blood glucose is lower than 54 mg/dL (3 mmol/L). You become confused, or you have trouble thinking clearly. You have trouble breathing. You have moderate to high ketone levels in your pee. These symptoms may be an emergency. Get help right away. Call 911. Do not wait to see if the symptoms will go away. Do not drive yourself to the hospital. This information is not intended to replace advice given to you by your health care provider. Make sure you discuss any questions you have with your health care provider. Document Revised: 03/08/2023 Document Reviewed: 06/11/2022 Elsevier Patient Education  2024 [employer].

## 2023-10-05 ENCOUNTER — Encounter: Payer: Self-pay | Admitting: Family Medicine

## 2023-10-05 LAB — MICROALBUMIN / CREATININE URINE RATIO

## 2023-10-07 ENCOUNTER — Encounter: Payer: Self-pay | Admitting: Family Medicine

## 2023-10-07 LAB — CMP14+EGFR
ALT: 11 IU/L (ref 0–44)
AST: 15 IU/L (ref 0–40)
Albumin: 4.3 g/dL (ref 3.8–4.9)
Alkaline Phosphatase: 65 IU/L (ref 44–121)
BUN/Creatinine Ratio: 15 (ref 9–20)
BUN: 22 mg/dL (ref 6–24)
Bilirubin Total: 0.4 mg/dL (ref 0.0–1.2)
CO2: 26 mmol/L (ref 20–29)
Calcium: 9.3 mg/dL (ref 8.7–10.2)
Chloride: 103 mmol/L (ref 96–106)
Creatinine, Ser: 1.45 mg/dL — ABNORMAL HIGH (ref 0.76–1.27)
Globulin, Total: 2.4 g/dL (ref 1.5–4.5)
Glucose: 76 mg/dL (ref 70–99)
Potassium: 5.1 mmol/L (ref 3.5–5.2)
Sodium: 143 mmol/L (ref 134–144)
Total Protein: 6.7 g/dL (ref 6.0–8.5)
eGFR: 56 mL/min/{1.73_m2} — ABNORMAL LOW (ref >59)

## 2023-10-07 LAB — MICROALBUMIN / CREATININE URINE RATIO

## 2023-10-07 LAB — SPECIMEN STATUS REPORT

## 2023-10-12 ENCOUNTER — Other Ambulatory Visit (HOSPITAL_COMMUNITY): Payer: Self-pay

## 2023-10-17 ENCOUNTER — Other Ambulatory Visit (HOSPITAL_COMMUNITY): Payer: Self-pay

## 2023-10-17 ENCOUNTER — Other Ambulatory Visit: Payer: Self-pay

## 2023-10-18 ENCOUNTER — Other Ambulatory Visit (HOSPITAL_COMMUNITY): Payer: Self-pay

## 2023-11-07 ENCOUNTER — Other Ambulatory Visit (HOSPITAL_COMMUNITY): Payer: Self-pay

## 2023-11-08 ENCOUNTER — Other Ambulatory Visit: Payer: Self-pay

## 2023-11-08 ENCOUNTER — Other Ambulatory Visit (HOSPITAL_COMMUNITY): Payer: Self-pay

## 2023-11-08 ENCOUNTER — Ambulatory Visit: Payer: Medicaid Other | Admitting: Cardiology

## 2023-11-15 ENCOUNTER — Telehealth: Admitting: Physician Assistant

## 2023-11-15 ENCOUNTER — Other Ambulatory Visit: Payer: Self-pay | Admitting: Physician Assistant

## 2023-11-15 ENCOUNTER — Other Ambulatory Visit (HOSPITAL_COMMUNITY): Payer: Self-pay

## 2023-11-15 ENCOUNTER — Encounter: Payer: Self-pay | Admitting: Pharmacist

## 2023-11-15 ENCOUNTER — Other Ambulatory Visit: Payer: Self-pay

## 2023-11-15 DIAGNOSIS — K047 Periapical abscess without sinus: Secondary | ICD-10-CM

## 2023-11-15 DIAGNOSIS — I110 Hypertensive heart disease with heart failure: Secondary | ICD-10-CM

## 2023-11-15 MED ORDER — ROSUVASTATIN CALCIUM 10 MG PO TABS
10.0000 mg | ORAL_TABLET | Freq: Every day | ORAL | 0 refills | Status: DC
  Filled 2023-11-15: qty 90, 90d supply, fill #0

## 2023-11-15 MED ORDER — AMOXICILLIN-POT CLAVULANATE 875-125 MG PO TABS
1.0000 | ORAL_TABLET | Freq: Two times a day (BID) | ORAL | 0 refills | Status: DC
Start: 2023-11-15 — End: 2024-03-08
  Filled 2023-11-15: qty 14, 7d supply, fill #0

## 2023-11-15 NOTE — Progress Notes (Signed)
 I have spent 5 minutes in review of e-visit questionnaire, review and updating patient chart, medical decision making and response to patient.   Piedad Climes, PA-C

## 2023-11-15 NOTE — Progress Notes (Signed)

## 2023-11-16 ENCOUNTER — Other Ambulatory Visit: Payer: Self-pay

## 2023-11-16 ENCOUNTER — Other Ambulatory Visit: Payer: Self-pay | Admitting: Physician Assistant

## 2023-11-16 ENCOUNTER — Other Ambulatory Visit (HOSPITAL_COMMUNITY): Payer: Self-pay

## 2023-11-16 ENCOUNTER — Other Ambulatory Visit: Payer: Self-pay | Admitting: Family Medicine

## 2023-11-16 DIAGNOSIS — M7502 Adhesive capsulitis of left shoulder: Secondary | ICD-10-CM

## 2023-11-16 DIAGNOSIS — E1165 Type 2 diabetes mellitus with hyperglycemia: Secondary | ICD-10-CM

## 2023-11-16 MED ORDER — CYCLOBENZAPRINE HCL 10 MG PO TABS
10.0000 mg | ORAL_TABLET | Freq: Every day | ORAL | 0 refills | Status: DC
  Filled 2023-11-16: qty 30, 30d supply, fill #0

## 2023-11-16 MED ORDER — ACCU-CHEK SOFTCLIX LANCETS MISC
2 refills | Status: AC
  Filled 2023-11-16: qty 100, 33d supply, fill #0
  Filled 2023-12-15: qty 100, 33d supply, fill #1
  Filled 2024-01-29: qty 100, 33d supply, fill #2

## 2023-11-17 ENCOUNTER — Other Ambulatory Visit (HOSPITAL_COMMUNITY): Payer: Self-pay

## 2023-11-17 ENCOUNTER — Other Ambulatory Visit: Payer: Self-pay

## 2023-11-18 ENCOUNTER — Ambulatory Visit: Admitting: Cardiology

## 2023-11-22 ENCOUNTER — Encounter: Payer: Self-pay | Admitting: Family Medicine

## 2023-11-22 ENCOUNTER — Telehealth: Admitting: Physician Assistant

## 2023-11-22 ENCOUNTER — Other Ambulatory Visit (HOSPITAL_COMMUNITY): Payer: Self-pay

## 2023-11-22 DIAGNOSIS — J45901 Unspecified asthma with (acute) exacerbation: Secondary | ICD-10-CM

## 2023-11-22 MED ORDER — FLUTICASONE PROPIONATE HFA 110 MCG/ACT IN AERO
1.0000 | INHALATION_SPRAY | Freq: Every day | RESPIRATORY_TRACT | 0 refills | Status: DC
  Filled 2023-11-22: qty 12, 30d supply, fill #0

## 2023-11-22 MED ORDER — PREDNISONE 20 MG PO TABS
40.0000 mg | ORAL_TABLET | Freq: Every day | ORAL | 0 refills | Status: DC
  Filled 2023-11-22: qty 10, 5d supply, fill #0

## 2023-11-22 NOTE — Progress Notes (Signed)
 E-Visit for Asthma  Based on what you have shared with me, it looks like you may have a flare up of your asthma.  Asthma is a chronic (ongoing) lung disease which results in airway obstruction, inflammation and hyper-responsiveness.   Asthma symptoms vary from person to person, with common symptoms including nighttime awakening and decreased ability to participate in normal activities as a result of shortness of breath. It is often triggered by changes in weather, changes in the season, changes in air temperature, or inside (home, school, daycare or work) allergens such as animal dander, mold, mildew, woodstoves or cockroaches.   It can also be triggered by hormonal changes, extreme emotion, physical exertion or an upper respiratory tract illness.     It is important to identify the trigger, and then eliminate or avoid the trigger if possible.   If you have been prescribed medications to be taken on a regular basis, it is important to follow the asthma action plan and to follow guidelines to adjust medication in response to increasing symptoms of decreased peak expiratory flow rate  Treatment: I have prescribed: Prednisone 40mg  by mouth per day for 5 days. I have also sent in a script for Flovent to use as directed.  You need to schedule a follow-up with your PCP for further management of your asthma.   HOME CARE Only take medications as instructed by your medical team. Consider wearing a mask or scarf to improve breathing air temperature have been shown to decrease irritation and decrease exacerbations Get rest. Taking a steamy shower or using a humidifier may help nasal congestion sand ease sore throat pain. You can place a towel over your head and breathe in the steam from hot water coming from a faucet. Using a saline nasal spray works much the same way.  Cough drops, hare candies  and sore throat lozenges may ease your cough.  Avoid close contacts especially the very you and the elderly Cover your mouth if you cough or sneeze Always remember to wash your hands.    GET HELP RIGHT AWAY IF: You develop worsening symptoms; breathlessness at rest, drowsy, confused or agitated, unable to speak in full sentences You have coughing fits You develop a severe headache or visual changes You develop shortness of breath, difficulty breathing or start having chest pain Your symptoms persist after you have completed your treatment plan If your symptoms do not improve within 10 days  MAKE SURE YOU Understand these instructions. Will watch your condition. Will get help right away if you are not doing well or get worse.   Your e-visit answers were reviewed by a board certified advanced clinical practitioner to complete your personal care plan, Depending upon the condition, your plan could have included both over the counter or prescription medications.   Please review your pharmacy choice. Your safety is important to [place]. If you have drug allergies check your prescription carefully.  You can use MyChart to ask questions about today's visit, request a non-urgent  call back, or ask for a work or school excuse for 24 hours related to this e-Visit. If it has been greater than 24 hours you will need to follow up with your provider, or enter a new e-Visit to address those concerns.   You will get an e-mail in the next two days asking about your experience. I hope that your e-visit has been valuable and will speed your recovery. Thank you for using e-visits.

## 2023-11-22 NOTE — Progress Notes (Signed)
 I have spent 5 minutes in review of e-visit questionnaire, review and updating patient chart, medical decision making and response to patient.   Piedad Climes, PA-C

## 2023-12-05 ENCOUNTER — Other Ambulatory Visit (HOSPITAL_COMMUNITY): Payer: Self-pay

## 2023-12-06 ENCOUNTER — Ambulatory Visit: Admitting: Cardiology

## 2023-12-08 ENCOUNTER — Other Ambulatory Visit (HOSPITAL_COMMUNITY): Payer: Self-pay

## 2023-12-08 ENCOUNTER — Other Ambulatory Visit: Payer: Self-pay

## 2023-12-09 ENCOUNTER — Other Ambulatory Visit (HOSPITAL_COMMUNITY): Payer: Self-pay

## 2023-12-14 ENCOUNTER — Ambulatory Visit: Admitting: Cardiology

## 2023-12-15 ENCOUNTER — Other Ambulatory Visit (HOSPITAL_COMMUNITY): Payer: Self-pay

## 2023-12-16 ENCOUNTER — Ambulatory Visit: Attending: Cardiology | Admitting: Cardiology

## 2023-12-20 ENCOUNTER — Other Ambulatory Visit: Payer: Self-pay | Admitting: Family Medicine

## 2023-12-20 ENCOUNTER — Other Ambulatory Visit (HOSPITAL_COMMUNITY): Payer: Self-pay

## 2023-12-21 ENCOUNTER — Other Ambulatory Visit (HOSPITAL_COMMUNITY): Payer: Self-pay

## 2023-12-21 ENCOUNTER — Other Ambulatory Visit: Payer: Self-pay | Admitting: Family Medicine

## 2023-12-21 ENCOUNTER — Other Ambulatory Visit: Payer: Self-pay

## 2023-12-21 DIAGNOSIS — J45901 Unspecified asthma with (acute) exacerbation: Secondary | ICD-10-CM

## 2023-12-21 MED ORDER — BD PEN NEEDLE MINI U/F 31G X 5 MM MISC
1.0000 | Freq: Every day | 0 refills | Status: AC
  Filled 2023-12-21: qty 100, 34d supply, fill #0

## 2023-12-21 MED ORDER — FLUTICASONE PROPIONATE HFA 110 MCG/ACT IN AERO
1.0000 | INHALATION_SPRAY | Freq: Every day | RESPIRATORY_TRACT | 0 refills | Status: DC
  Filled 2023-12-21: qty 12, 30d supply, fill #0

## 2023-12-22 ENCOUNTER — Other Ambulatory Visit: Payer: Self-pay

## 2023-12-22 ENCOUNTER — Other Ambulatory Visit (HOSPITAL_COMMUNITY): Payer: Self-pay

## 2024-01-03 ENCOUNTER — Ambulatory Visit: Payer: Medicaid Other | Admitting: Family Medicine

## 2024-01-08 ENCOUNTER — Other Ambulatory Visit: Payer: Self-pay | Admitting: Family Medicine

## 2024-01-08 ENCOUNTER — Other Ambulatory Visit: Payer: Self-pay | Admitting: Physician Assistant

## 2024-01-08 DIAGNOSIS — M7502 Adhesive capsulitis of left shoulder: Secondary | ICD-10-CM

## 2024-01-08 DIAGNOSIS — E1165 Type 2 diabetes mellitus with hyperglycemia: Secondary | ICD-10-CM

## 2024-01-08 DIAGNOSIS — I110 Hypertensive heart disease with heart failure: Secondary | ICD-10-CM

## 2024-01-08 DIAGNOSIS — E1142 Type 2 diabetes mellitus with diabetic polyneuropathy: Secondary | ICD-10-CM

## 2024-01-09 ENCOUNTER — Other Ambulatory Visit: Payer: Self-pay

## 2024-01-09 NOTE — Telephone Encounter (Signed)
 Requested medications are due for refill today.  yes  Requested medications are on the active medications list.  yes  Last refill. 11/16/2023 #30 0 rf  Future visit scheduled.   yes  Notes to clinic.  Refill not delegated.    Requested Prescriptions  Pending Prescriptions Disp Refills   cyclobenzaprine  (FLEXERIL ) 10 MG tablet 30 tablet 0    Sig: Take 1 tablet (10 mg total) by mouth at bedtime.     Not Delegated - Analgesics:  Muscle Relaxants Failed - 01/09/2024  5:45 PM      Failed - This refill cannot be delegated      Passed - Valid encounter within last 6 months    Recent Outpatient Visits           3 months ago Hypertension associated with diabetes [facility])   [facility] Dept Of [place]. [facility] Joaquin Mulberry, MD   4 months ago Type 2 diabetes mellitus with hyperglycemia, with long-term current use of insulin  [facility])   [facility] - [facility] Dept Of [place]. [facility] [place], [place], [place]   1 year ago Type 2 diabetes mellitus with hyperglycemia, with long-term current use of insulin  [facility])   [facility] - A Dept Of [place]. [facility] Joaquin Mulberry, MD   1 year ago Type 2 diabetes mellitus with hyperglycemia, with long-term current use of insulin  [facility])   [facility] - A Dept Of [place]. [facility] Joaquin Mulberry, MD   2 years ago Type 2 diabetes mellitus with hyperglycemia, with long-term current use of insulin  [facility])   [facility] - A Dept Of [place]. [facility] Joaquin Mulberry, MD       Future Appointments             In 2 weeks Revankar, Micael Adas, MD [facility]

## 2024-01-10 ENCOUNTER — Other Ambulatory Visit: Payer: Self-pay

## 2024-01-10 ENCOUNTER — Other Ambulatory Visit (HOSPITAL_COMMUNITY): Payer: Self-pay

## 2024-01-10 MED ORDER — GABAPENTIN 300 MG PO CAPS
600.0000 mg | ORAL_CAPSULE | Freq: Two times a day (BID) | ORAL | 3 refills | Status: DC
  Filled 2024-01-10: qty 120, 30d supply, fill #0
  Filled 2024-01-29 – 2024-02-05 (×2): qty 120, 30d supply, fill #1
  Filled 2024-03-06: qty 120, 30d supply, fill #2
  Filled 2024-04-04: qty 120, 30d supply, fill #3

## 2024-01-10 MED ORDER — FUROSEMIDE 40 MG PO TABS
40.0000 mg | ORAL_TABLET | Freq: Every day | ORAL | 3 refills | Status: DC
  Filled 2024-01-10: qty 30, 30d supply, fill #0
  Filled 2024-02-05: qty 30, 30d supply, fill #1
  Filled 2024-03-06: qty 30, 30d supply, fill #2
  Filled 2024-04-04: qty 30, 30d supply, fill #3

## 2024-01-10 MED ORDER — CYCLOBENZAPRINE HCL 10 MG PO TABS
10.0000 mg | ORAL_TABLET | Freq: Every day | ORAL | 0 refills | Status: DC
Start: 2024-01-10 — End: 2024-04-04
  Filled 2024-01-10: qty 30, 30d supply, fill #0

## 2024-01-10 MED ORDER — METFORMIN HCL 500 MG PO TABS
1000.0000 mg | ORAL_TABLET | Freq: Two times a day (BID) | ORAL | 3 refills | Status: DC
  Filled 2024-01-10: qty 120, 30d supply, fill #0
  Filled 2024-02-05: qty 120, 30d supply, fill #1
  Filled 2024-03-06: qty 120, 30d supply, fill #2
  Filled 2024-04-04: qty 120, 30d supply, fill #3

## 2024-01-10 NOTE — Telephone Encounter (Signed)
 Requested medication (s) are due for refill today: yes  Requested medication (s) are on the active medication list: yes  Last refill:  gabapentin :08/18/23 #120 3 RF             furosemide : 08/18/23 #30 3 RF                     Metformin : 08/18/23 #120 3 RF  Future visit scheduled: yes  Notes to clinic:  overdue lab work    Requested Prescriptions  Pending Prescriptions Disp Refills   gabapentin  (NEURONTIN ) 300 MG capsule 120 capsule 3    Sig: Take 2 capsules (600 mg total) by mouth 2 (two) times daily.     Neurology: Anticonvulsants - gabapentin  Failed - 01/10/2024  9:31 AM      Failed - Cr in normal range and within 360 days    Creatinine  Date Value Ref Range Status  03/16/2014 1.00 0.60 - 1.30 mg/dL Final   Creatinine, Ser  Date Value Ref Range Status  10/04/2023 1.45 (H) 0.76 - 1.27 mg/dL Final         Passed - Completed PHQ-2 or PHQ-9 in the last 360 days      Passed - Valid encounter within last 12 months    Recent Outpatient Visits           3 months ago Hypertension associated with diabetes [facility])   [facility] Dept Of [place]. [facility] Joaquin Mulberry, MD   4 months ago Type 2 diabetes mellitus with hyperglycemia, with long-term current use of insulin  [facility])   [facility] - [facility] Dept Of [place]. [facility], [place], [place]   1 year ago Type 2 diabetes mellitus with hyperglycemia, with long-term current use of insulin  [facility])   [facility] - A Dept Of [place]. [facility] Joaquin Mulberry, MD   1 year ago Type 2 diabetes mellitus with hyperglycemia, with long-term current use of insulin  [facility])   [facility] - A Dept Of [place]. [facility] Joaquin Mulberry, MD   2 years ago Type 2 diabetes mellitus with hyperglycemia, with long-term current use of insulin  [facility])   [place] [facility] - A Dept Of [place]. [facility] Joaquin Mulberry, MD       Future Appointments             In 2 weeks Revankar, Micael Adas, MD [facility]             furosemide  (LASIX ) 40 MG tablet 30 tablet 3    Sig: Take 1 tablet (40 mg total) by mouth daily.     Cardiovascular:  Diuretics - Loop Failed - 01/10/2024  9:31 AM      Failed - Cr in normal range and within 180 days    Creatinine  Date Value Ref Range Status  03/16/2014 1.00 0.60 - 1.30 mg/dL Final   Creatinine, Ser  Date Value Ref Range Status  10/04/2023 1.45 (H) 0.76 - 1.27 mg/dL Final         Failed - Mg Level in normal range and within 180 days    Magnesium   Date Value Ref Range Status  06/05/2018 1.7 1.7 - 2.4 mg/dL Final    Comment:    Performed at [facility], [address] [place], [place]  [address]         Passed - K in normal range and within 180 days    Potassium  Date Value Ref Range Status  10/04/2023 5.1 3.5 - 5.2 mmol/L Final  03/16/2014 4.1 3.5 - 5.1 mmol/L Final         Passed - Ca in normal range and within 180 days    Calcium   Date Value Ref Range Status  10/04/2023 9.3 8.7 - 10.2 mg/dL Final   Calcium , Total  Date Value Ref Range Status  03/16/2014 8.5 8.5 - 10.1 mg/dL Final         Passed - Na in normal range and within 180 days    Sodium  Date Value Ref Range Status  10/04/2023 143 134 - 144 mmol/L Final  03/16/2014 133 (L) 136 - 145 mmol/L Final         Passed - Cl in normal range and within 180 days    Chloride  Date Value Ref Range Status  10/04/2023 103 96 - 106 mmol/L Final  03/16/2014 100 98 - 107 mmol/L Final         Passed - Last BP in normal range    BP Readings from Last 1 Encounters:  10/04/23 112/65         Passed - Valid encounter within last 6 months    Recent Outpatient Visits           3 months ago Hypertension associated with diabetes (HCC)   [facility] [place] [facility] Dept Of [place]. [facility] Joaquin Mulberry, MD   4 months ago  Type 2 diabetes mellitus with hyperglycemia, with long-term current use of insulin  [facility])   [place] [facility] - A Dept Of [place]. [facility] [place], [place], [place]   1 year ago Type 2 diabetes mellitus with hyperglycemia, with long-term current use of insulin  [facility])   [place] [facility] - A Dept Of [place]. [facility] Joaquin Mulberry, MD   1 year ago Type 2 diabetes mellitus with hyperglycemia, with long-term current use of insulin  [facility])   [place] [facility] - A Dept Of [place]. [facility] Joaquin Mulberry, MD   2 years ago Type 2 diabetes mellitus with hyperglycemia, with long-term current use of insulin  [facility])   [facility] - A Dept Of [place]. [facility] Joaquin Mulberry, MD       Future Appointments             In 2 weeks Revankar, Micael Adas, MD [facility]             metFORMIN  (GLUCOPHAGE ) 500 MG tablet 120 tablet 3    Sig: Take 2 tablets (1,000 mg total) by mouth 2 (two) times daily with a meal.     Endocrinology:  Diabetes - Biguanides Failed - 01/10/2024  9:31 AM      Failed - Cr in normal range and within 360 days    Creatinine  Date Value Ref Range Status  03/16/2014 1.00 0.60 - 1.30 mg/dL Final   Creatinine, Ser  Date Value Ref Range Status  10/04/2023 1.45 (H) 0.76 - 1.27 mg/dL Final         Failed - eGFR in normal range and within 360 days    EGFR (African American)  Date Value Ref Range Status  03/16/2014 >60  Final  GFR calc Af Amer  Date Value Ref Range Status  05/19/2020 84 >59 mL/min/1.73 Final    Comment:    **[facility] currently reports eGFR in compliance with the current**   recommendations of the [employer]. [facility] will   update reporting as new guidelines are published from the NKF-ASN   Task force.    EGFR (Non-African Amer.)  Date Value Ref Range Status  03/16/2014 >60  Final    Comment:     eGFR values <93mL/min/1.73 m2 may be an indication of chronic kidney disease (CKD). Calculated eGFR is useful in patients with stable renal function. The eGFR calculation will not be reliable in acutely ill patients when serum creatinine is changing rapidly. It is not useful in  patients on dialysis. The eGFR calculation may not be applicable to patients at the low and high extremes of body sizes, pregnant women, and vegetarians.    GFR, Estimated  Date Value Ref Range Status  08/20/2022 43 (L) >60 mL/min Final    Comment:    (NOTE) Calculated using the CKD-EPI Creatinine Equation (2021)    eGFR  Date Value Ref Range Status  10/04/2023 56 (L) >59 mL/min/1.73 Final         Failed - B12 Level in normal range and within 720 days    No results found for: "VITAMINB12"       Failed - CBC within normal limits and completed in the last 12 months    WBC  Date Value Ref Range Status  08/20/2022 5.8 4.0 - 10.5 K/uL Final   RBC  Date Value Ref Range Status  08/20/2022 5.05 4.22 - 5.81 MIL/uL Final   Hemoglobin  Date Value Ref Range Status  08/20/2022 14.9 13.0 - 17.0 g/dL Final  44/08/270 53.6 13.0 - 17.7 g/dL Final   HCT  Date Value Ref Range Status  08/20/2022 42.5 39.0 - 52.0 % Final   Hematocrit  Date Value Ref Range Status  12/03/2021 40.0 37.5 - 51.0 % Final   MCHC  Date Value Ref Range Status  08/20/2022 35.1 30.0 - 36.0 g/dL Final   [facility]  Date Value Ref Range Status  08/20/2022 29.5 26.0 - 34.0 pg Final   MCV  Date Value Ref Range Status  08/20/2022 84.2 80.0 - 100.0 fL Final  12/03/2021 84 79 - 97 fL Final  03/14/2014 82 80 - 100 fL Final   No results found for: "PLTCOUNTKUC", "LABPLAT", "POCPLA" RDW  Date Value Ref Range Status  08/20/2022 12.1 11.5 - 15.5 % Final  12/03/2021 13.1 11.6 - 15.4 % Final  03/14/2014 13.5 11.5 - 14.5 % Final         Passed - HBA1C is between 0 and 7.9 and within 180 days    Hemoglobin A1C  Date Value Ref Range  Status  03/15/2014 7.1 (H) 4.2 - 6.3 % Final    Comment:    The American Diabetes Association recommends that a primary goal of therapy should be <7% and that physicians should reevaluate the treatment regimen in patients with HbA1c values consistently >8%.    HbA1c, POC (controlled diabetic range)  Date Value Ref Range Status  08/18/2023 7.4 (A) 0.0 - 7.0 % Final         Passed - Valid encounter within last 6 months    Recent Outpatient Visits           3 months ago Hypertension associated with diabetes [facility])   [place] [facility]  Dept Of [place]. [facility] Joaquin Mulberry, MD   4 months ago Type 2 diabetes mellitus with hyperglycemia, with long-term current use of insulin  [facility])   [place] [facility] - A Dept Of [employer]. [facility], [place], [place]   1 year ago Type 2 diabetes mellitus with hyperglycemia, with long-term current use of insulin  [facility])   [place] [facility] - A Dept Of [place]. [facility] Joaquin Mulberry, MD   1 year ago Type 2 diabetes mellitus with hyperglycemia, with long-term current use of insulin  [facility])   [place] [facility] - A Dept Of [place]. [facility] Joaquin Mulberry, MD   2 years ago Type 2 diabetes mellitus with hyperglycemia, with long-term current use of insulin  [facility])   [place] [facility] - A Dept Of [place]. [facility] Joaquin Mulberry, MD       Future Appointments             In 2 weeks Revankar, Micael Adas, MD [facility]

## 2024-01-11 ENCOUNTER — Other Ambulatory Visit: Payer: Self-pay

## 2024-01-15 ENCOUNTER — Other Ambulatory Visit: Payer: Self-pay | Admitting: Family Medicine

## 2024-01-15 DIAGNOSIS — J45901 Unspecified asthma with (acute) exacerbation: Secondary | ICD-10-CM

## 2024-01-16 ENCOUNTER — Other Ambulatory Visit: Payer: Self-pay

## 2024-01-16 MED ORDER — FLUTICASONE PROPIONATE HFA 110 MCG/ACT IN AERO
1.0000 | INHALATION_SPRAY | Freq: Every day | RESPIRATORY_TRACT | 0 refills | Status: DC
Start: 2024-01-16 — End: 2024-04-14
  Filled 2024-01-16: qty 12, 90d supply, fill #0

## 2024-01-24 ENCOUNTER — Ambulatory Visit: Admitting: Cardiology

## 2024-01-29 ENCOUNTER — Other Ambulatory Visit: Payer: Self-pay | Admitting: Family Medicine

## 2024-01-29 DIAGNOSIS — M7502 Adhesive capsulitis of left shoulder: Secondary | ICD-10-CM

## 2024-01-29 DIAGNOSIS — E1165 Type 2 diabetes mellitus with hyperglycemia: Secondary | ICD-10-CM

## 2024-01-29 DIAGNOSIS — Z7985 Long-term (current) use of injectable non-insulin antidiabetic drugs: Secondary | ICD-10-CM

## 2024-01-30 MED ORDER — OZEMPIC (0.25 OR 0.5 MG/DOSE) 2 MG/3ML ~~LOC~~ SOPN
0.5000 mg | PEN_INJECTOR | SUBCUTANEOUS | 1 refills | Status: DC
  Filled 2024-01-30 – 2024-03-06 (×2): qty 9, 84d supply, fill #0
  Filled 2024-06-06: qty 9, 84d supply, fill #1

## 2024-01-31 ENCOUNTER — Other Ambulatory Visit: Payer: Self-pay

## 2024-01-31 ENCOUNTER — Other Ambulatory Visit (HOSPITAL_COMMUNITY): Payer: Self-pay

## 2024-02-03 ENCOUNTER — Other Ambulatory Visit: Payer: Self-pay | Admitting: Family Medicine

## 2024-02-03 ENCOUNTER — Other Ambulatory Visit (HOSPITAL_COMMUNITY): Payer: Self-pay

## 2024-02-03 ENCOUNTER — Other Ambulatory Visit: Payer: Self-pay

## 2024-02-03 DIAGNOSIS — M7502 Adhesive capsulitis of left shoulder: Secondary | ICD-10-CM

## 2024-02-06 ENCOUNTER — Ambulatory Visit: Admitting: Family Medicine

## 2024-02-06 ENCOUNTER — Other Ambulatory Visit (HOSPITAL_COMMUNITY): Payer: Self-pay

## 2024-02-07 ENCOUNTER — Ambulatory Visit: Admitting: Family Medicine

## 2024-02-12 ENCOUNTER — Other Ambulatory Visit: Payer: Self-pay | Admitting: Physician Assistant

## 2024-02-12 ENCOUNTER — Other Ambulatory Visit: Payer: Self-pay | Admitting: Family Medicine

## 2024-02-12 DIAGNOSIS — I152 Hypertension secondary to endocrine disorders: Secondary | ICD-10-CM

## 2024-02-12 DIAGNOSIS — I110 Hypertensive heart disease with heart failure: Secondary | ICD-10-CM

## 2024-02-13 ENCOUNTER — Other Ambulatory Visit: Payer: Self-pay

## 2024-02-13 ENCOUNTER — Other Ambulatory Visit (HOSPITAL_COMMUNITY): Payer: Self-pay

## 2024-02-13 MED ORDER — ROSUVASTATIN CALCIUM 10 MG PO TABS
10.0000 mg | ORAL_TABLET | Freq: Every day | ORAL | 0 refills | Status: DC
  Filled 2024-02-13: qty 90, 90d supply, fill #0

## 2024-02-13 MED ORDER — AMLODIPINE BESYLATE 10 MG PO TABS
10.0000 mg | ORAL_TABLET | Freq: Every day | ORAL | 1 refills | Status: DC
  Filled 2024-02-13: qty 90, 90d supply, fill #0
  Filled 2024-05-14: qty 90, 90d supply, fill #1

## 2024-02-13 MED ORDER — CARVEDILOL 6.25 MG PO TABS
6.2500 mg | ORAL_TABLET | Freq: Two times a day (BID) | ORAL | 1 refills | Status: DC
  Filled 2024-02-13: qty 180, 90d supply, fill #0
  Filled 2024-05-14: qty 180, 90d supply, fill #1

## 2024-02-28 ENCOUNTER — Ambulatory Visit: Admitting: Cardiology

## 2024-03-02 ENCOUNTER — Ambulatory Visit: Admitting: Orthopedic Surgery

## 2024-03-06 ENCOUNTER — Other Ambulatory Visit: Payer: Self-pay

## 2024-03-06 ENCOUNTER — Other Ambulatory Visit (HOSPITAL_BASED_OUTPATIENT_CLINIC_OR_DEPARTMENT_OTHER): Payer: Self-pay

## 2024-03-08 ENCOUNTER — Other Ambulatory Visit: Payer: Self-pay

## 2024-03-13 ENCOUNTER — Ambulatory Visit: Attending: Cardiology | Admitting: Cardiology

## 2024-03-23 ENCOUNTER — Ambulatory Visit: Admitting: Orthopedic Surgery

## 2024-03-26 ENCOUNTER — Ambulatory Visit: Admitting: Family Medicine

## 2024-04-03 ENCOUNTER — Other Ambulatory Visit (HOSPITAL_COMMUNITY): Payer: Self-pay

## 2024-04-04 ENCOUNTER — Other Ambulatory Visit (HOSPITAL_COMMUNITY): Payer: Self-pay

## 2024-04-04 ENCOUNTER — Other Ambulatory Visit: Payer: Self-pay

## 2024-04-04 ENCOUNTER — Other Ambulatory Visit: Payer: Self-pay | Admitting: Family Medicine

## 2024-04-04 DIAGNOSIS — M7502 Adhesive capsulitis of left shoulder: Secondary | ICD-10-CM

## 2024-04-04 MED ORDER — CYCLOBENZAPRINE HCL 10 MG PO TABS
10.0000 mg | ORAL_TABLET | Freq: Every day | ORAL | 0 refills | Status: DC
  Filled 2024-04-04: qty 30, 30d supply, fill #0

## 2024-04-14 ENCOUNTER — Other Ambulatory Visit: Payer: Self-pay | Admitting: Family Medicine

## 2024-04-14 DIAGNOSIS — J45901 Unspecified asthma with (acute) exacerbation: Secondary | ICD-10-CM

## 2024-04-15 MED ORDER — FLUTICASONE PROPIONATE HFA 110 MCG/ACT IN AERO
1.0000 | INHALATION_SPRAY | Freq: Every day | RESPIRATORY_TRACT | 0 refills | Status: DC
  Filled 2024-04-15: qty 12, 90d supply, fill #0

## 2024-04-16 ENCOUNTER — Ambulatory Visit: Admitting: Pulmonary Disease

## 2024-04-16 ENCOUNTER — Other Ambulatory Visit: Payer: Self-pay

## 2024-04-24 ENCOUNTER — Other Ambulatory Visit: Payer: Self-pay

## 2024-04-24 ENCOUNTER — Other Ambulatory Visit (HOSPITAL_COMMUNITY): Payer: Self-pay

## 2024-04-24 ENCOUNTER — Telehealth: Admitting: Physician Assistant

## 2024-04-24 DIAGNOSIS — H9209 Otalgia, unspecified ear: Secondary | ICD-10-CM | POA: Diagnosis not present

## 2024-04-24 MED ORDER — AMOXICILLIN 875 MG PO TABS
875.0000 mg | ORAL_TABLET | Freq: Two times a day (BID) | ORAL | 0 refills | Status: AC
  Filled 2024-04-24: qty 20, 10d supply, fill #0

## 2024-04-24 NOTE — Progress Notes (Signed)
 I have spent 5 minutes in review of e-visit questionnaire, review and updating patient chart, medical decision making and response to patient.   Elsie Velma Lunger, PA-C

## 2024-04-24 NOTE — Progress Notes (Signed)
 E-Visit for Ear Pain - Acute Otitis Media   We are sorry that you are not feeling well. Here is how we plan to help!  Based on what you have shared with me it looks like you have Acute Otitis Media.  Acute Otitis Media is an infection of the middle or "inner" ear. This type of infection can cause redness, inflammation, and fluid buildup behind the tympanic membrane (ear drum).  The usual symptoms include: Earache/Pain Fever Upper respiratory symptoms Lack of energy/Fatigue/Malaise Slight hearing loss gradually worsening- if the inner ear fills with fluid What causes middle ear infections? Most middle ear infections occur when an infection such as a cold, leads to a build-up of mucus in the middle ear and causes the Eustachian tube (a thin tube that runs from the middle ear to the back of the nose) to become swollen or blocked.   This means mucus can't drain away properly, making it easier for an infection to spread into the middle ear.  How middle ear infections are treated: Most ear infections clear up within three to five days and don't need any specific treatment. If necessary, tylenol or ibuprofen should be used to relieve pain and a high temperature.  If you develop a fever higher than 102, or any significantly worsening symptoms, this could indicate a more serious infection moving to the middle/inner and needs face to face evaluation in an office by a provider.   Antibiotics aren't routinely used to treat middle ear infections, although they may occasionally be prescribed if symptoms persist or are particularly severe. Given your presentation,   I have prescribed Amoxicillin 875 mg one tablet twice daily for 10 days   Your symptoms should improve over the next 3 days and should resolve in about 7 days. Be sure to complete ALL of the prescription(s) given.  HOME CARE: Wash your hands frequently. If you are prescribed an ear drop, do not place the tip of the bottle on your ear or  touch it with your fingers. You can take Acetaminophen 650 mg every 4-6 hours as needed for pain.  If pain is severe or moderate, you can apply a heating pad (set on low) or hot water bottle (wrapped in a towel) to outer ear for 20 minutes.  This will also increase drainage.  GET HELP RIGHT AWAY IF: Fever is over 102.2 degrees. You develop progressive ear pain or hearing loss. Ear symptoms persist longer than 3 days after treatment.  MAKE SURE YOU: Understand these instructions. Will watch your condition. Will get help right away if you are not doing well or get worse.  Thank you for choosing an e-visit.  Your e-visit answers were reviewed by a board certified advanced clinical practitioner to complete your personal care plan. Depending upon the condition, your plan could have included both over the counter or prescription medications.  Please review your pharmacy choice. Make sure the pharmacy is open so you can pick up the prescription now. If there is a problem, you may contact your provider through [employer] and have the prescription routed to another pharmacy.  Your safety is important to [place]. If you have drug allergies check your prescription carefully.   For the next 24 hours you can use MyChart to ask questions about today's visit, request a non-urgent call back, or ask for a work or school excuse. You will get an email with a survey after your eVisit asking about your experience. We would appreciate your feedback. I hope  that your e-visit has been valuable and will aid in your recovery.

## 2024-04-24 NOTE — Progress Notes (Signed)
 Message sent to patient requesting further input regarding current symptoms. Awaiting patient response.

## 2024-04-29 ENCOUNTER — Other Ambulatory Visit: Payer: Self-pay | Admitting: Family Medicine

## 2024-04-29 DIAGNOSIS — E1142 Type 2 diabetes mellitus with diabetic polyneuropathy: Secondary | ICD-10-CM

## 2024-04-29 DIAGNOSIS — I110 Hypertensive heart disease with heart failure: Secondary | ICD-10-CM

## 2024-04-29 DIAGNOSIS — E1165 Type 2 diabetes mellitus with hyperglycemia: Secondary | ICD-10-CM

## 2024-04-30 ENCOUNTER — Encounter: Payer: Self-pay | Admitting: Family Medicine

## 2024-04-30 ENCOUNTER — Other Ambulatory Visit: Payer: Self-pay

## 2024-04-30 ENCOUNTER — Other Ambulatory Visit (HOSPITAL_COMMUNITY): Payer: Self-pay

## 2024-04-30 ENCOUNTER — Ambulatory Visit: Attending: Family Medicine | Admitting: Family Medicine

## 2024-04-30 VITALS — BP 150/84 | HR 85 | Ht 69.0 in | Wt 195.0 lb

## 2024-04-30 DIAGNOSIS — E1142 Type 2 diabetes mellitus with diabetic polyneuropathy: Secondary | ICD-10-CM

## 2024-04-30 DIAGNOSIS — Z794 Long term (current) use of insulin: Secondary | ICD-10-CM | POA: Diagnosis not present

## 2024-04-30 DIAGNOSIS — I110 Hypertensive heart disease with heart failure: Secondary | ICD-10-CM | POA: Diagnosis not present

## 2024-04-30 DIAGNOSIS — Z23 Encounter for immunization: Secondary | ICD-10-CM | POA: Diagnosis not present

## 2024-04-30 DIAGNOSIS — M5410 Radiculopathy, site unspecified: Secondary | ICD-10-CM

## 2024-04-30 DIAGNOSIS — E1165 Type 2 diabetes mellitus with hyperglycemia: Secondary | ICD-10-CM | POA: Diagnosis not present

## 2024-04-30 DIAGNOSIS — E1159 Type 2 diabetes mellitus with other circulatory complications: Secondary | ICD-10-CM | POA: Diagnosis not present

## 2024-04-30 DIAGNOSIS — I152 Hypertension secondary to endocrine disorders: Secondary | ICD-10-CM

## 2024-04-30 DIAGNOSIS — M7502 Adhesive capsulitis of left shoulder: Secondary | ICD-10-CM

## 2024-04-30 DIAGNOSIS — I5042 Chronic combined systolic (congestive) and diastolic (congestive) heart failure: Secondary | ICD-10-CM | POA: Diagnosis not present

## 2024-04-30 LAB — POCT GLYCOSYLATED HEMOGLOBIN (HGB A1C): HbA1c, POC (controlled diabetic range): 8.4 % — AB (ref 0.0–7.0)

## 2024-04-30 MED ORDER — METFORMIN HCL 500 MG PO TABS
1000.0000 mg | ORAL_TABLET | Freq: Two times a day (BID) | ORAL | 1 refills | Status: AC
  Filled 2024-04-30: qty 360, 90d supply, fill #0
  Filled 2024-07-30: qty 360, 90d supply, fill #1

## 2024-04-30 MED ORDER — CYCLOBENZAPRINE HCL 10 MG PO TABS
10.0000 mg | ORAL_TABLET | Freq: Every evening | ORAL | 1 refills | Status: DC | PRN
  Filled 2024-04-30: qty 30, 30d supply, fill #0
  Filled 2024-05-30 – 2024-06-06 (×2): qty 30, 30d supply, fill #1

## 2024-04-30 MED ORDER — FUROSEMIDE 40 MG PO TABS
40.0000 mg | ORAL_TABLET | Freq: Every day | ORAL | 1 refills | Status: AC
  Filled 2024-04-30: qty 90, 90d supply, fill #0
  Filled 2024-07-30: qty 90, 90d supply, fill #1

## 2024-04-30 MED ORDER — ROSUVASTATIN CALCIUM 10 MG PO TABS
10.0000 mg | ORAL_TABLET | Freq: Every day | ORAL | 1 refills | Status: AC
  Filled 2024-04-30: qty 90, 90d supply, fill #0
  Filled 2024-08-15: qty 90, 90d supply, fill #1

## 2024-04-30 MED ORDER — GABAPENTIN 300 MG PO CAPS
600.0000 mg | ORAL_CAPSULE | Freq: Two times a day (BID) | ORAL | 1 refills | Status: AC
  Filled 2024-04-30 – 2024-05-03 (×2): qty 360, 90d supply, fill #0
  Filled 2024-07-26 – 2024-07-30 (×2): qty 360, 90d supply, fill #1

## 2024-04-30 MED ORDER — TAMSULOSIN HCL 0.4 MG PO CAPS
0.4000 mg | ORAL_CAPSULE | Freq: Every day | ORAL | 1 refills | Status: AC
  Filled 2024-04-30: qty 90, 90d supply, fill #0
  Filled 2024-07-26: qty 90, 90d supply, fill #1

## 2024-04-30 NOTE — Patient Instructions (Signed)
 Cervical Radiculopathy  Cervical radiculopathy means that a nerve in the neck (a cervical nerve) is pinched or bruised. This can happen because of an injury to the cervical spine (vertebrae) in the neck, or as a normal part of getting older. This condition can cause pain or loss of feeling (numbness) that runs from your neck all the way down to your arm and fingers. Often, this condition gets better with rest. Treatment may be needed if the condition does not get better. What are the causes? A neck injury. A bulging disk in your spine. Sudden muscle tightening (muscle spasms). Tight muscles in your neck due to overuse. Arthritis. Breakdown in the bones and joints of the spine (spondylosis) due to getting older. Bone spurs that form near the nerves in the neck. What are the signs or symptoms? Pain. The pain may: Run from the neck to the arm and hand. Be very bad or irritating. Get worse when you move your neck. Loss of feeling or tingling in your arm or hand. Weakness in your arm or hand, in very bad cases. How is this treated? In many cases, treatment is not needed for this condition. With rest, the condition often gets better over time. If treatment is needed, options may include: Wearing a soft neck collar (cervical collar) for short periods of time. Doing exercises (physical therapy) to strengthen your neck muscles. Taking medicines. Having shots (injections) in your spine, in very bad cases. Having surgery. This may be needed if other treatments do not help. The type of surgery that is used will depend on the cause of your condition. Follow these instructions at home: If you have a soft neck collar: Wear it as told by your doctor. Take it off only as told by your doctor. Ask your doctor if you can take the collar off for cleaning and bathing. If you are allowed to take the collar off for cleaning or bathing: Follow instructions from your doctor about how to take off the collar  safely. Clean the collar by wiping it with mild soap and water and drying it completely. Take out any removable pads in the collar every 1-2 days. Wash them by hand with soap and water. Let them air-dry completely before you put them back in the collar. Check your skin under the collar for redness or sores. If you see any, tell your doctor. Managing pain     Take over-the-counter and prescription medicines only as told by your doctor. If told, put ice on the painful area. To do this: If you have a soft neck collar, take if off as told by your doctor. Put ice in a plastic bag. Place a towel between your skin and the bag. Leave the ice on for 20 minutes, 2-3 times a day. Take off the ice if your skin turns bright red. This is very important. If you cannot feel pain, heat, or cold, you have a greater risk of damage to the area. If using ice does not help, you can try using heat. Use the heat source that your doctor recommends, such as a moist heat pack or a heating pad. Place a towel between your skin and the heat source. Leave the heat on for 20-30 minutes. Take off the heat if your skin turns bright red. This is very important. If you cannot feel pain, heat, or cold, you have a greater risk of getting burned. You may try a gentle neck and shoulder rub (massage). Activity Rest as needed. Return  to your normal activities when your doctor says that it is safe. Do exercises as told by your doctor or physical therapist. You may have to avoid lifting. Ask your doctor how much you can safely lift. General instructions Use a flat pillow when you sleep. Do not drive while wearing a soft neck collar. If you do not have a soft neck collar, ask your doctor if it is safe to drive while your neck heals. Ask your doctor if you should avoid driving or using machines while you are taking your medicine. Do not smoke or use any products that contain nicotine or tobacco. If you need help quitting, ask your  doctor. Keep all follow-up visits. Contact a doctor if: Your condition does not get better with treatment. Get help right away if: Your pain gets worse and medicine does not help. You lose feeling or feel weak in your hand, arm, face, or leg. You have a high fever. Your neck is stiff. You cannot control when you poop or pee (have incontinence). You have trouble with walking, balance, or talking. Summary Cervical radiculopathy means that a nerve in the neck is pinched or bruised. A nerve can get pinched from a bulging disk, arthritis, an injury to the neck, or other causes. Symptoms include pain, tingling, or loss of feeling that goes from the neck to the arm or hand. Weakness in your arm or hand can happen in very bad cases. Treatment may include resting, wearing a soft neck collar, and doing exercises. You might need to take medicines for pain. In very bad cases, shots or surgery may be needed. This information is not intended to replace advice given to you by your health care provider. Make sure you discuss any questions you have with your health care provider. Document Revised: 01/29/2021 Document Reviewed: 01/29/2021 Elsevier Patient Education  2024 [employer].

## 2024-04-30 NOTE — Progress Notes (Signed)
 Subjective:  Patient ID: Kenneth Rios, male    DOB: Mar 01, 1966  Age: 58 y.o. MRN: [id number]  CC: Medical Management of Chronic Issues (Tingling and pain in right arm)     Discussed the use of AI scribe software for clinical note transcription with the patient, who gave verbal consent to proceed.  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  He experiences tingling and numbness in his right arm, extending from the shoulder to the hand, for the past three months. The sensation is described as 'needles' and occurs continuously throughout the day, occasionally causing pain. Gabapentin  provides partial relief, and resting his arm on pillows offers some comfort, though tingling persists. No weakness is noted, but he has difficulty feeling his pinky, ring, and middle fingers at times. He is ambidextrous. He recalls severe neck spasms in his early twenties, treated with an injection that resolved the issue.  But he has no neck pain now.  He has a history of congestive heart failure and states he is short of breath all the time and goes on to tell me he has been short of breath from when he was a child. There is no ankle swelling or chest pain, but there is a family history of heart problems, including heart attacks.  His diabetes management has been inconsistent, with a recent increase in A1c from 7.4 to 8.4 due to running out of medication. He is currently on metformin  1000 mg twice daily and Ozempic  once a week, and he has resumed taking his medications. He denies current smoking but has a history of smoking in high school.      Past Medical History:  Diagnosis Date   (HFpEF) heart failure with preserved ejection fraction (HCC) 03/30/2018   Accelerated hypertension 06/05/2018   Acute viral bronchitis 08/24/2017   Adhesive capsulitis of left shoulder 08/27/2021   Anxiety    Anxiety and depression 02/06/2018   Asthma with status asthmaticus    CAD (coronary artery disease) 12/03/2021   Chest tightness 03/29/2018   CHF (congestive heart failure) (HCC)    fluttering   Depression    Diabetes mellitus due to underlying condition with unspecified complications (HCC) 01/09/2020   Diabetes mellitus without complication (HCC)    Diverticulitis    Diverticulitis large intestine 05/31/2018   GERD (gastroesophageal reflux disease)    Hyperlipidemia    Hypertension    Hypertension associated with diabetes (HCC) 08/24/2017   Hypertensive heart disease with chronic combined systolic and diastolic congestive heart failure (HCC) 08/24/2017   Hypertensive urgency 08/24/2017   Microcytic anemia 12/19/2018   Mixed dyslipidemia 01/09/2020   Peritonitis (HCC) 09/02/2017   Pneumonia 12/16/2018   Prolonged Q-T interval on ECG 02/06/2018   Type 2 diabetes mellitus with hyperglycemia, with long-term current use of insulin  (HCC) 08/27/2021   Unstable angina (HCC) 03/29/2018    Past Surgical History:  Procedure Laterality Date   CARDIAC CATHETERIZATION  03/30/2018   COLON RESECTION N/A 09/05/2017   Procedure: HARTMAN'S COLECTOMY AND COLOSTOMY;  Surgeon: Stevie Herlene Righter, MD;  Location: [facility] OR;  Service: General;  Laterality: N/A;   COLON SURGERY  COLOSTOMY TAKEDOWN N/A 05/31/2018   Procedure: LAPAROSCOPIC COLOSTOMY REVERSAL COLORECTAL ANASTOMOSIS ERAS PATHWAY;  Surgeon: [name], Herlene Righter, MD;  Location: [facility] OR;  Service: General;  Laterality: N/A;   CORONARY PRESSURE/FFR STUDY N/A 03/30/2018   Procedure: INTRAVASCULAR PRESSURE WIRE/FFR STUDY;  Surgeon: Mady Bruckner, MD;  Location: [facility];   Service: Cardiovascular;  Laterality: N/A;   LEFT HEART CATH AND CORONARY ANGIOGRAPHY N/A 03/30/2018   Procedure: LEFT HEART CATH AND CORONARY ANGIOGRAPHY;  Surgeon: Mady Bruckner, MD;  Location: [facility];  Service: Cardiovascular;  Laterality: N/A;    Family History  Problem Relation Age of Onset   Heart disease Mother    Heart disease Sister    Diabetes Maternal Aunt    Heart disease Maternal Aunt    Diabetes Maternal Uncle    Sudden Cardiac Death Neg Hx     Social History   Socioeconomic History   Marital status: Single    Spouse name: Not on file   Number of children: 3   Years of education: Not on file   Highest education level: 12th grade  Occupational History   Occupation: Disabled  Tobacco Use   Smoking status: Former   Smokeless tobacco: Never  Advertising account planner   Vaping status: Never Used  Substance and Sexual Activity   Alcohol use: Yes    Alcohol/week: 6.0 standard drinks of alcohol    Types: 6 Cans of beer per week    Comment: on the weekends   Drug use: No   Sexual activity: Yes    Partners: Female  Other Topics Concern   Not on file  Social History Narrative   Not on file   Social Drivers of Health   Financial Resource Strain: High Risk (02/05/2024)   Overall Financial Resource Strain (CARDIA)    Difficulty of Paying Living Expenses: Very hard  Food Insecurity: Food Insecurity Present (02/05/2024)   Hunger Vital Sign    Worried About Running Out of Food in the Last Year: Often true    Ran Out of Food in the Last Year: Often true  Transportation Needs: Unmet Transportation Needs (02/05/2024)   PRAPARE - Administrator, Civil Service (Medical): Yes    Lack of Transportation (Non-Medical): Yes  Physical Activity: Insufficiently Active (02/05/2024)   Exercise Vital Sign    Days of Exercise per Week: 2 days    Minutes of Exercise per Session: 10 min  Stress: Stress Concern Present (02/05/2024)   [employer] of Occupational Health  - Occupational Stress Questionnaire    Feeling of Stress: Very much  Social Connections: Socially Isolated (02/05/2024)   Social Connection and Isolation Panel    Frequency of Communication with Friends and Family: Once a week    Frequency of Social Gatherings with Friends and Family: Never    Attends Religious Services: Never    Database administrator or Organizations: No    Attends Engineer, structural: Not on file    Marital Status: Living with partner    No Known Allergies  Outpatient Medications Prior to Visit  Medication Sig Dispense Refill   Accu-Chek Softclix Lancets lancets Use to check blood sugar three times daily. E11.65 100 each 2   amLODipine  (NORVASC ) 10 MG tablet Take 1 tablet (10 mg total) by mouth daily. 90 tablet 1   aspirin  EC 81 MG tablet Take 1 tablet (81 mg total) by mouth daily. Swallow whole. 90 tablet 3   benzonatate  (TESSALON ) 100 MG  capsule Take 1-2 capsules (100-200 mg total) by mouth 3 (three) times daily as needed. 30 capsule 0   Blood Glucose Monitoring Suppl (ACCU-CHEK GUIDE) w/Device KIT Use to check blood sugar three times daily. E11.65 1 kit 0   carvedilol  (COREG ) 6.25 MG tablet Take 1 tablet (6.25 mg total) by mouth 2 (two) times daily with a meal. 180 tablet 1   cetirizine  (ZYRTEC ) 10 MG tablet Take 1 tablet (10 mg total) by mouth daily. 30 tablet 11   fluticasone  (FLOVENT  HFA) 110 MCG/ACT inhaler Inhale 1 puff into the lungs daily. 12 g 0   glucose blood (ACCU-CHEK GUIDE) test strip Use to check blood sugar three times daily. 100 each 0   Insulin  Pen Needle (B-D UF III MINI PEN NEEDLES) 31G X 5 MM MISC Use daily with insulin . 100 each 0   nitroGLYCERIN  (NITROSTAT ) 0.4 MG SL tablet Place 0.4 mg under the tongue every 5 (five) minutes as needed for chest pain.     Semaglutide ,0.25 or 0.5MG /DOS, (OZEMPIC , 0.25 OR 0.5 MG/DOSE,) 2 MG/3ML SOPN Inject 0.5 mg into the skin once a week. 9 mL 1   cyclobenzaprine  (FLEXERIL ) 10 MG tablet Take 1 tablet  (10 mg total) by mouth at bedtime. 30 tablet 0   furosemide  (LASIX ) 40 MG tablet Take 1 tablet (40 mg total) by mouth daily. 30 tablet 3   gabapentin  (NEURONTIN ) 300 MG capsule Take 2 capsules (600 mg total) by mouth 2 (two) times daily. 120 capsule 3   metFORMIN  (GLUCOPHAGE ) 500 MG tablet Take 2 tablets (1,000 mg total) by mouth 2 (two) times daily with a meal. 120 tablet 3   rosuvastatin  (CRESTOR ) 10 MG tablet Take 1 tablet (10 mg total) by mouth daily. 90 tablet 0   tamsulosin  (FLOMAX ) 0.4 MG CAPS capsule Take 1 capsule (0.4 mg total) by mouth daily. 90 capsule 1   amoxicillin  (AMOXIL ) 875 MG tablet Take 1 tablet (875 mg total) by mouth 2 (two) times daily for 10 days. (Patient not taking: Reported on 04/30/2024) 20 tablet 0   No facility-administered medications prior to visit.     ROS Review of Systems  Constitutional:  Negative for activity change and appetite change.  HENT:  Negative for sinus pressure and sore throat.   Respiratory:  Positive for shortness of breath. Negative for chest tightness and wheezing.   Cardiovascular:  Negative for chest pain and palpitations.  Gastrointestinal:  Negative for abdominal distention, abdominal pain and constipation.  Genitourinary: Negative.   Musculoskeletal:        See HPI  Psychiatric/Behavioral:  Negative for behavioral problems and dysphoric mood.     Objective:  BP (!) 150/84   Pulse 85   Ht 5' 9 (1.753 m)   Wt 195 lb (88.5 kg)   SpO2 99%   BMI 28.80 kg/m      04/30/2024    3:57 PM 04/30/2024    3:31 PM 10/04/2023    2:17 PM  BP/Weight  Systolic BP 150 155 112  Diastolic BP 84 95 65  Wt. (Lbs)  195 189.6  BMI  28.8 kg/m2 28 kg/m2      Physical Exam Constitutional:      Appearance: He is well-developed.  Cardiovascular:     Rate and Rhythm: Normal rate.     Heart sounds: Normal heart sounds. No murmur heard. Pulmonary:     Effort: Pulmonary effort is normal.     Breath sounds: Normal breath sounds. No wheezing  or rales.  Chest:  Chest wall: No tenderness.  Abdominal:     General: Bowel sounds are normal. There is no distension.     Palpations: Abdomen is soft. There is no mass.     Tenderness: There is no abdominal tenderness.  Musculoskeletal:        General: Normal range of motion.     Cervical back: Tenderness (TTP of R trapezius) present. No rigidity.     Right lower leg: No edema.     Left lower leg: No edema.  Neurological:     Mental Status: He is alert and oriented to person, place, and time.     Comments: Normal handgrip bilaterally  Psychiatric:        Mood and Affect: Mood normal.        Latest Ref Rng & Units 10/04/2023    2:54 PM 08/20/2022    3:10 PM 04/26/2022    3:58 PM  CMP  Glucose 70 - 99 mg/dL 76  893  871   BUN 6 - 24 mg/dL 22  18  17    Creatinine 0.76 - 1.27 mg/dL 8.54  8.18  8.89   Sodium 134 - 144 mmol/L 143  136  137   Potassium 3.5 - 5.2 mmol/L 5.1  3.8  4.9   Chloride 96 - 106 mmol/L 103  101  100   CO2 20 - 29 mmol/L 26  24  23    Calcium  8.7 - 10.2 mg/dL 9.3  9.3  9.6   Total Protein 6.0 - 8.5 g/dL 6.7  7.2    Total Bilirubin 0.0 - 1.2 mg/dL 0.4  1.0    Alkaline Phos 44 - 121 IU/L 65  52    AST 0 - 40 IU/L 15  20    ALT 0 - 44 IU/L 11  11      Lipid Panel     Component Value Date/Time   CHOL 245 (H) 12/03/2021 0858   TRIG 138 12/03/2021 0858   HDL 49 12/03/2021 0858   CHOLHDL 5.0 12/03/2021 0858   CHOLHDL 6.1 03/31/2018 0227   VLDL UNABLE TO CALCULATE IF TRIGLYCERIDE OVER 400 mg/dL 91/76/7980 9772   LDLCALC 171 (H) 12/03/2021 0858    CBC    Component Value Date/Time   WBC 5.8 08/20/2022 1510   RBC 5.05 08/20/2022 1510   HGB 14.9 08/20/2022 1510   HGB 14.1 12/03/2021 0858   HCT 42.5 08/20/2022 1510   HCT 40.0 12/03/2021 0858   PLT 361 08/20/2022 1510   PLT 278 12/03/2021 0858   MCV 84.2 08/20/2022 1510   MCV 84 12/03/2021 0858   MCV 82 03/14/2014 0400   MCH 29.5 08/20/2022 1510   MCHC 35.1 08/20/2022 1510   RDW 12.1 08/20/2022  1510   RDW 13.1 12/03/2021 0858   RDW 13.5 03/14/2014 0400   LYMPHSABS 2.1 08/20/2022 1510   LYMPHSABS 2.4 12/03/2021 0858   LYMPHSABS 0.9 (L) 03/14/2014 0400   MONOABS 0.4 08/20/2022 1510   MONOABS 0.1 (L) 03/14/2014 0400   EOSABS 0.2 08/20/2022 1510   EOSABS 0.2 12/03/2021 0858   EOSABS 0.0 03/14/2014 0400   BASOSABS 0.0 08/20/2022 1510   BASOSABS 0.0 12/03/2021 0858   BASOSABS 0.0 03/14/2014 0400    Lab Results  Component Value Date   HGBA1C 8.4 (A) 04/30/2024    Lab Results  Component Value Date   HGBA1C 8.4 (A) 04/30/2024   HGBA1C 7.4 (A) 08/18/2023   HGBA1C 7.2 (A) 10/13/2022       Assessment &  Plan Cervical radiculopathy, right arm Chronic right arm tingling and numbness for three months, suggestive of cervical radiculopathy. Partial relief with gabapentin . - Order cervical spine x-ray. - Refer to orthopedics for evaluation and management. - Prescribe muscle relaxant PRN.  Type 2 diabetes mellitus with hyperglycemia A1c increased from 7.4 to 8.4 due to medication lapse. Resumed metformin  and Ozempic . - Continue metformin  and Ozempic . - Recheck A1c in three months. - Perform renal and hepatic function tests.   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 - Recheck blood pressure at next visit, adjust regimen if elevated. -Counseled on blood pressure goal of less than 130/80, low-sodium, DASH diet, medication compliance, 150 minutes of moderate intensity exercise per week. Discussed medication compliance, adverse effects.    Need for immunization against influenza - Flu shot administered    Meds ordered this encounter  Medications    cyclobenzaprine  (FLEXERIL ) 10 MG tablet    Sig: Take 1 tablet (10 mg total) by mouth at bedtime as needed for muscle spasms.    Dispense:  30 tablet    Refill:  1   furosemide  (LASIX ) 40 MG tablet    Sig: Take 1 tablet (40 mg total) by mouth daily.    Dispense:  90 tablet    Refill:  1   gabapentin  (NEURONTIN ) 300 MG capsule    Sig: Take 2 capsules (600 mg total) by mouth 2 (two) times daily.    Dispense:  360 capsule    Refill:  1   metFORMIN  (GLUCOPHAGE ) 500 MG tablet    Sig: Take 2 tablets (1,000 mg total) by mouth 2 (two) times daily with a meal.    Dispense:  360 tablet    Refill:  1   rosuvastatin  (CRESTOR ) 10 MG tablet    Sig: Take 1 tablet (10 mg total) by mouth daily.    Dispense:  90 tablet    Refill:  1   tamsulosin  (FLOMAX ) 0.4 MG CAPS capsule    Sig: Take 1 capsule (0.4 mg total) by mouth daily.    Dispense:  90 capsule    Refill:  1    Follow-up: Return in about 3 months (around 07/30/2024) for Chronic medical conditions.       Corrina Sabin, MD, FAAFP. [facility] [place], [place] [phone]   04/30/2024, 5:38 PM

## 2024-05-01 ENCOUNTER — Other Ambulatory Visit: Payer: Self-pay

## 2024-05-01 ENCOUNTER — Ambulatory Visit: Payer: Self-pay | Admitting: Family Medicine

## 2024-05-01 LAB — CMP14+EGFR
ALT: 16 IU/L (ref 0–44)
AST: 16 IU/L (ref 0–40)
Albumin: 4.5 g/dL (ref 3.8–4.9)
Alkaline Phosphatase: 76 IU/L (ref 47–123)
BUN/Creatinine Ratio: 12 (ref 9–20)
BUN: 12 mg/dL (ref 6–24)
Bilirubin Total: 0.3 mg/dL (ref 0.0–1.2)
CO2: 25 mmol/L (ref 20–29)
Calcium: 9.8 mg/dL (ref 8.7–10.2)
Chloride: 101 mmol/L (ref 96–106)
Creatinine, Ser: 1.03 mg/dL (ref 0.76–1.27)
Globulin, Total: 2.7 g/dL (ref 1.5–4.5)
Glucose: 217 mg/dL — ABNORMAL HIGH (ref 70–99)
Potassium: 5.1 mmol/L (ref 3.5–5.2)
Sodium: 139 mmol/L (ref 134–144)
Total Protein: 7.2 g/dL (ref 6.0–8.5)
eGFR: 84 mL/min/1.73 (ref >59)

## 2024-05-03 ENCOUNTER — Other Ambulatory Visit: Payer: Self-pay

## 2024-05-03 ENCOUNTER — Other Ambulatory Visit (HOSPITAL_COMMUNITY): Payer: Self-pay

## 2024-05-14 ENCOUNTER — Other Ambulatory Visit: Payer: Self-pay

## 2024-05-29 ENCOUNTER — Ambulatory Visit: Admitting: Pulmonary Disease

## 2024-05-30 ENCOUNTER — Other Ambulatory Visit (HOSPITAL_COMMUNITY): Payer: Self-pay

## 2024-05-31 ENCOUNTER — Encounter: Payer: Self-pay | Admitting: Pharmacist

## 2024-05-31 ENCOUNTER — Other Ambulatory Visit: Payer: Self-pay

## 2024-06-05 ENCOUNTER — Other Ambulatory Visit: Payer: Self-pay

## 2024-06-06 ENCOUNTER — Other Ambulatory Visit: Payer: Self-pay

## 2024-06-06 ENCOUNTER — Other Ambulatory Visit (HOSPITAL_COMMUNITY): Payer: Self-pay

## 2024-06-11 ENCOUNTER — Encounter: Payer: Self-pay | Admitting: Radiology

## 2024-07-01 ENCOUNTER — Other Ambulatory Visit: Payer: Self-pay | Admitting: Family Medicine

## 2024-07-01 DIAGNOSIS — M7502 Adhesive capsulitis of left shoulder: Secondary | ICD-10-CM

## 2024-07-02 ENCOUNTER — Other Ambulatory Visit (HOSPITAL_COMMUNITY): Payer: Self-pay

## 2024-07-02 ENCOUNTER — Other Ambulatory Visit: Payer: Self-pay

## 2024-07-02 MED ORDER — CYCLOBENZAPRINE HCL 10 MG PO TABS
10.0000 mg | ORAL_TABLET | Freq: Every evening | ORAL | 1 refills | Status: DC | PRN
  Filled 2024-07-02: qty 30, 30d supply, fill #0
  Filled 2024-08-15: qty 30, 30d supply, fill #1

## 2024-07-13 ENCOUNTER — Ambulatory Visit: Admitting: Orthopedic Surgery

## 2024-07-13 ENCOUNTER — Ambulatory Visit: Admitting: Pulmonary Disease

## 2024-07-18 ENCOUNTER — Ambulatory Visit: Attending: Cardiology | Admitting: Cardiology

## 2024-07-20 ENCOUNTER — Ambulatory Visit: Admitting: Pulmonary Disease

## 2024-07-26 ENCOUNTER — Other Ambulatory Visit: Payer: Self-pay

## 2024-07-26 ENCOUNTER — Other Ambulatory Visit (HOSPITAL_COMMUNITY): Payer: Self-pay

## 2024-07-26 ENCOUNTER — Other Ambulatory Visit: Payer: Self-pay | Admitting: Family Medicine

## 2024-07-26 DIAGNOSIS — J45901 Unspecified asthma with (acute) exacerbation: Secondary | ICD-10-CM

## 2024-07-26 MED ORDER — FLUTICASONE PROPIONATE HFA 110 MCG/ACT IN AERO
1.0000 | INHALATION_SPRAY | Freq: Every day | RESPIRATORY_TRACT | 0 refills | Status: AC
  Filled 2024-07-26: qty 12, 90d supply, fill #0

## 2024-07-30 ENCOUNTER — Other Ambulatory Visit: Payer: Self-pay

## 2024-07-30 ENCOUNTER — Other Ambulatory Visit (HOSPITAL_COMMUNITY): Payer: Self-pay

## 2024-07-30 ENCOUNTER — Ambulatory Visit: Admitting: Family Medicine

## 2024-08-15 ENCOUNTER — Other Ambulatory Visit: Payer: Self-pay

## 2024-08-15 ENCOUNTER — Other Ambulatory Visit: Payer: Self-pay | Admitting: Family Medicine

## 2024-08-15 ENCOUNTER — Other Ambulatory Visit (HOSPITAL_COMMUNITY): Payer: Self-pay

## 2024-08-15 DIAGNOSIS — I152 Hypertension secondary to endocrine disorders: Secondary | ICD-10-CM

## 2024-08-15 MED ORDER — AMLODIPINE BESYLATE 10 MG PO TABS
10.0000 mg | ORAL_TABLET | Freq: Every day | ORAL | 0 refills | Status: DC
  Filled 2024-08-15: qty 30, 30d supply, fill #0

## 2024-08-15 MED ORDER — CARVEDILOL 6.25 MG PO TABS
6.2500 mg | ORAL_TABLET | Freq: Two times a day (BID) | ORAL | 0 refills | Status: DC
  Filled 2024-08-15: qty 60, 30d supply, fill #0

## 2024-09-10 ENCOUNTER — Other Ambulatory Visit (HOSPITAL_COMMUNITY): Payer: Self-pay

## 2024-09-10 ENCOUNTER — Other Ambulatory Visit: Payer: Self-pay | Admitting: Family Medicine

## 2024-09-10 ENCOUNTER — Other Ambulatory Visit: Payer: Self-pay

## 2024-09-10 DIAGNOSIS — I152 Hypertension secondary to endocrine disorders: Secondary | ICD-10-CM

## 2024-09-10 DIAGNOSIS — Z7985 Long-term (current) use of injectable non-insulin antidiabetic drugs: Secondary | ICD-10-CM

## 2024-09-10 DIAGNOSIS — M7502 Adhesive capsulitis of left shoulder: Secondary | ICD-10-CM

## 2024-09-10 DIAGNOSIS — E1165 Type 2 diabetes mellitus with hyperglycemia: Secondary | ICD-10-CM

## 2024-09-10 MED ORDER — OZEMPIC (0.25 OR 0.5 MG/DOSE) 2 MG/3ML ~~LOC~~ SOPN
0.5000 mg | PEN_INJECTOR | SUBCUTANEOUS | 0 refills | Status: AC
  Filled 2024-09-10: qty 3, 28d supply, fill #0

## 2024-09-10 MED ORDER — CYCLOBENZAPRINE HCL 10 MG PO TABS
10.0000 mg | ORAL_TABLET | Freq: Every evening | ORAL | 0 refills | Status: AC | PRN
  Filled 2024-09-10: qty 30, 30d supply, fill #0

## 2024-09-10 MED ORDER — AMLODIPINE BESYLATE 10 MG PO TABS
10.0000 mg | ORAL_TABLET | Freq: Every day | ORAL | 0 refills | Status: AC
  Filled 2024-09-10: qty 30, 30d supply, fill #0

## 2024-09-10 MED ORDER — CARVEDILOL 6.25 MG PO TABS
6.2500 mg | ORAL_TABLET | Freq: Two times a day (BID) | ORAL | 0 refills | Status: AC
  Filled 2024-09-10: qty 60, 30d supply, fill #0

## 2024-09-18 ENCOUNTER — Ambulatory Visit: Admitting: Family Medicine

## 2024-10-03 ENCOUNTER — Ambulatory Visit: Admitting: Cardiology
